# Patient Record
Sex: Female | Born: 1980 | Race: White | Hispanic: No | Marital: Married | Smoking: Current every day smoker
Health system: Southern US, Community
[De-identification: ages and names within clinical notes are randomized; demographics above are authoritative.]

## PROBLEM LIST (undated history)

## (undated) VITALS — BP 111/78 | HR 120 | Temp 98.8°F | Resp 16

## (undated) DIAGNOSIS — J189 Pneumonia, unspecified organism: Secondary | ICD-10-CM

## (undated) DIAGNOSIS — K829 Disease of gallbladder, unspecified: Secondary | ICD-10-CM

## (undated) DIAGNOSIS — F32A Depression, unspecified: Secondary | ICD-10-CM

## (undated) DIAGNOSIS — G894 Chronic pain syndrome: Secondary | ICD-10-CM

## (undated) DIAGNOSIS — J45909 Unspecified asthma, uncomplicated: Secondary | ICD-10-CM

## (undated) DIAGNOSIS — E119 Type 2 diabetes mellitus without complications: Secondary | ICD-10-CM

## (undated) DIAGNOSIS — M549 Dorsalgia, unspecified: Secondary | ICD-10-CM

## (undated) DIAGNOSIS — F429 Obsessive-compulsive disorder, unspecified: Secondary | ICD-10-CM

## (undated) DIAGNOSIS — F329 Major depressive disorder, single episode, unspecified: Secondary | ICD-10-CM

## (undated) DIAGNOSIS — F419 Anxiety disorder, unspecified: Secondary | ICD-10-CM

## (undated) HISTORY — PX: OTHER SURGICAL HISTORY: SHX169

---

## 2001-09-01 ENCOUNTER — Emergency Department (HOSPITAL_COMMUNITY): Admission: EM | Admit: 2001-09-01 | Discharge: 2001-09-01 | Payer: Self-pay | Admitting: Emergency Medicine

## 2003-08-06 ENCOUNTER — Emergency Department (HOSPITAL_COMMUNITY): Admission: EM | Admit: 2003-08-06 | Discharge: 2003-08-06 | Payer: Self-pay | Admitting: Emergency Medicine

## 2003-08-07 ENCOUNTER — Emergency Department (HOSPITAL_COMMUNITY): Admission: EM | Admit: 2003-08-07 | Discharge: 2003-08-07 | Payer: Self-pay | Admitting: Emergency Medicine

## 2005-10-15 ENCOUNTER — Emergency Department (HOSPITAL_COMMUNITY): Admission: EM | Admit: 2005-10-15 | Discharge: 2005-10-15 | Payer: Self-pay | Admitting: *Deleted

## 2005-10-20 ENCOUNTER — Emergency Department (HOSPITAL_COMMUNITY): Admission: EM | Admit: 2005-10-20 | Discharge: 2005-10-20 | Payer: Self-pay | Admitting: Emergency Medicine

## 2005-10-25 ENCOUNTER — Emergency Department (HOSPITAL_COMMUNITY): Admission: EM | Admit: 2005-10-25 | Discharge: 2005-10-25 | Payer: Self-pay | Admitting: Emergency Medicine

## 2006-11-30 ENCOUNTER — Inpatient Hospital Stay (HOSPITAL_COMMUNITY): Admission: AD | Admit: 2006-11-30 | Discharge: 2006-11-30 | Payer: Self-pay | Admitting: Gynecology

## 2006-12-06 ENCOUNTER — Inpatient Hospital Stay (HOSPITAL_COMMUNITY): Admission: AD | Admit: 2006-12-06 | Discharge: 2006-12-06 | Payer: Self-pay | Admitting: Obstetrics and Gynecology

## 2006-12-09 ENCOUNTER — Inpatient Hospital Stay (HOSPITAL_COMMUNITY): Admission: AD | Admit: 2006-12-09 | Discharge: 2006-12-10 | Payer: Self-pay | Admitting: Obstetrics and Gynecology

## 2007-02-15 ENCOUNTER — Emergency Department (HOSPITAL_COMMUNITY): Admission: EM | Admit: 2007-02-15 | Discharge: 2007-02-15 | Payer: Self-pay | Admitting: Emergency Medicine

## 2007-02-22 ENCOUNTER — Inpatient Hospital Stay (HOSPITAL_COMMUNITY): Admission: AD | Admit: 2007-02-22 | Discharge: 2007-02-23 | Payer: Self-pay | Admitting: Obstetrics & Gynecology

## 2007-03-03 ENCOUNTER — Inpatient Hospital Stay (HOSPITAL_COMMUNITY): Admission: RE | Admit: 2007-03-03 | Discharge: 2007-03-03 | Payer: Self-pay | Admitting: Obstetrics & Gynecology

## 2007-04-14 ENCOUNTER — Inpatient Hospital Stay (HOSPITAL_COMMUNITY): Admission: AD | Admit: 2007-04-14 | Discharge: 2007-04-14 | Payer: Self-pay | Admitting: Obstetrics & Gynecology

## 2007-04-14 ENCOUNTER — Ambulatory Visit: Payer: Self-pay | Admitting: Physician Assistant

## 2007-04-18 ENCOUNTER — Encounter (INDEPENDENT_AMBULATORY_CARE_PROVIDER_SITE_OTHER): Payer: Self-pay | Admitting: Nurse Practitioner

## 2007-06-06 ENCOUNTER — Inpatient Hospital Stay (HOSPITAL_COMMUNITY): Admission: AD | Admit: 2007-06-06 | Discharge: 2007-06-07 | Payer: Self-pay | Admitting: Obstetrics and Gynecology

## 2007-07-12 DIAGNOSIS — F329 Major depressive disorder, single episode, unspecified: Secondary | ICD-10-CM

## 2007-07-15 ENCOUNTER — Inpatient Hospital Stay (HOSPITAL_COMMUNITY): Admission: AD | Admit: 2007-07-15 | Discharge: 2007-07-15 | Payer: Self-pay | Admitting: Obstetrics and Gynecology

## 2007-07-16 ENCOUNTER — Inpatient Hospital Stay (HOSPITAL_COMMUNITY): Admission: AD | Admit: 2007-07-16 | Discharge: 2007-07-19 | Payer: Self-pay | Admitting: Obstetrics and Gynecology

## 2007-10-12 HISTORY — PX: CHOLECYSTECTOMY: SHX55

## 2007-12-01 ENCOUNTER — Emergency Department (HOSPITAL_COMMUNITY): Admission: EM | Admit: 2007-12-01 | Discharge: 2007-12-01 | Payer: Self-pay | Admitting: Emergency Medicine

## 2008-02-26 ENCOUNTER — Emergency Department (HOSPITAL_COMMUNITY): Admission: EM | Admit: 2008-02-26 | Discharge: 2008-02-26 | Payer: Self-pay | Admitting: Emergency Medicine

## 2008-03-02 ENCOUNTER — Emergency Department (HOSPITAL_COMMUNITY): Admission: EM | Admit: 2008-03-02 | Discharge: 2008-03-02 | Payer: Self-pay | Admitting: Emergency Medicine

## 2008-03-23 ENCOUNTER — Emergency Department (HOSPITAL_COMMUNITY): Admission: EM | Admit: 2008-03-23 | Discharge: 2008-03-24 | Payer: Self-pay | Admitting: Emergency Medicine

## 2008-08-13 ENCOUNTER — Ambulatory Visit: Payer: Self-pay | Admitting: Nurse Practitioner

## 2008-08-13 DIAGNOSIS — Z720 Tobacco use: Secondary | ICD-10-CM | POA: Insufficient documentation

## 2008-08-13 DIAGNOSIS — F17200 Nicotine dependence, unspecified, uncomplicated: Secondary | ICD-10-CM | POA: Insufficient documentation

## 2008-08-13 DIAGNOSIS — G4700 Insomnia, unspecified: Secondary | ICD-10-CM | POA: Insufficient documentation

## 2008-08-19 ENCOUNTER — Encounter (INDEPENDENT_AMBULATORY_CARE_PROVIDER_SITE_OTHER): Payer: Self-pay | Admitting: Nurse Practitioner

## 2008-08-22 ENCOUNTER — Telehealth (INDEPENDENT_AMBULATORY_CARE_PROVIDER_SITE_OTHER): Payer: Self-pay | Admitting: Nurse Practitioner

## 2008-09-10 ENCOUNTER — Encounter (INDEPENDENT_AMBULATORY_CARE_PROVIDER_SITE_OTHER): Payer: Self-pay | Admitting: Nurse Practitioner

## 2008-09-12 ENCOUNTER — Telehealth (INDEPENDENT_AMBULATORY_CARE_PROVIDER_SITE_OTHER): Payer: Self-pay | Admitting: Nurse Practitioner

## 2008-09-18 ENCOUNTER — Ambulatory Visit: Payer: Self-pay | Admitting: Nurse Practitioner

## 2008-09-18 DIAGNOSIS — M549 Dorsalgia, unspecified: Secondary | ICD-10-CM | POA: Insufficient documentation

## 2008-09-18 LAB — CONVERTED CEMR LAB: Urobilinogen, UA: 1

## 2008-09-19 ENCOUNTER — Encounter (INDEPENDENT_AMBULATORY_CARE_PROVIDER_SITE_OTHER): Payer: Self-pay | Admitting: Nurse Practitioner

## 2008-09-19 ENCOUNTER — Telehealth (INDEPENDENT_AMBULATORY_CARE_PROVIDER_SITE_OTHER): Payer: Self-pay | Admitting: Nurse Practitioner

## 2008-09-19 DIAGNOSIS — E7800 Pure hypercholesterolemia, unspecified: Secondary | ICD-10-CM | POA: Insufficient documentation

## 2008-09-19 LAB — CONVERTED CEMR LAB
CO2: 23 meq/L (ref 19–32)
Calcium: 8.9 mg/dL (ref 8.4–10.5)
Chloride: 102 meq/L (ref 96–112)
Creatinine, Ser: 0.79 mg/dL (ref 0.40–1.20)
Eosinophils Absolute: 0.3 10*3/uL (ref 0.0–0.7)
Lymphocytes Relative: 35 % (ref 12–46)
MCHC: 32.9 g/dL (ref 30.0–36.0)
MCV: 93.9 fL (ref 78.0–100.0)
Monocytes Relative: 7 % (ref 3–12)
Neutro Abs: 4.9 10*3/uL (ref 1.7–7.7)
Potassium: 4 meq/L (ref 3.5–5.3)
RDW: 12.6 % (ref 11.5–15.5)
TSH: 1.262 microintl units/mL (ref 0.350–4.50)
Total Bilirubin: 0.3 mg/dL (ref 0.3–1.2)
VLDL: 64 mg/dL — ABNORMAL HIGH (ref 0–40)

## 2008-09-22 ENCOUNTER — Emergency Department (HOSPITAL_COMMUNITY): Admission: EM | Admit: 2008-09-22 | Discharge: 2008-09-22 | Payer: Self-pay | Admitting: Emergency Medicine

## 2008-10-13 ENCOUNTER — Inpatient Hospital Stay (HOSPITAL_COMMUNITY): Admission: AD | Admit: 2008-10-13 | Discharge: 2008-10-13 | Payer: Self-pay | Admitting: Family Medicine

## 2008-11-01 ENCOUNTER — Telehealth (INDEPENDENT_AMBULATORY_CARE_PROVIDER_SITE_OTHER): Payer: Self-pay | Admitting: Nurse Practitioner

## 2008-11-19 ENCOUNTER — Telehealth (INDEPENDENT_AMBULATORY_CARE_PROVIDER_SITE_OTHER): Payer: Self-pay | Admitting: Nurse Practitioner

## 2008-12-26 ENCOUNTER — Ambulatory Visit: Payer: Self-pay | Admitting: Nurse Practitioner

## 2008-12-26 ENCOUNTER — Other Ambulatory Visit: Admission: RE | Admit: 2008-12-26 | Discharge: 2008-12-26 | Payer: Self-pay | Admitting: Internal Medicine

## 2008-12-26 ENCOUNTER — Encounter (INDEPENDENT_AMBULATORY_CARE_PROVIDER_SITE_OTHER): Payer: Self-pay | Admitting: Nurse Practitioner

## 2008-12-26 LAB — CONVERTED CEMR LAB
Beta hcg, urine, semiquantitative: NEGATIVE
Bilirubin Urine: NEGATIVE
Chlamydia, DNA Probe: NEGATIVE
GC Probe Amp, Genital: NEGATIVE
Glucose, Urine, Semiquant: NEGATIVE
pH: 7.5

## 2008-12-31 ENCOUNTER — Encounter (INDEPENDENT_AMBULATORY_CARE_PROVIDER_SITE_OTHER): Payer: Self-pay | Admitting: Nurse Practitioner

## 2009-01-02 ENCOUNTER — Telehealth (INDEPENDENT_AMBULATORY_CARE_PROVIDER_SITE_OTHER): Payer: Self-pay | Admitting: Nurse Practitioner

## 2009-01-07 ENCOUNTER — Telehealth (INDEPENDENT_AMBULATORY_CARE_PROVIDER_SITE_OTHER): Payer: Self-pay | Admitting: Nurse Practitioner

## 2009-01-08 ENCOUNTER — Encounter (INDEPENDENT_AMBULATORY_CARE_PROVIDER_SITE_OTHER): Payer: Self-pay | Admitting: Nurse Practitioner

## 2009-01-17 ENCOUNTER — Encounter (INDEPENDENT_AMBULATORY_CARE_PROVIDER_SITE_OTHER): Payer: Self-pay | Admitting: Nurse Practitioner

## 2009-01-17 ENCOUNTER — Encounter: Admission: RE | Admit: 2009-01-17 | Discharge: 2009-03-03 | Payer: Self-pay | Admitting: Nurse Practitioner

## 2009-01-31 ENCOUNTER — Telehealth (INDEPENDENT_AMBULATORY_CARE_PROVIDER_SITE_OTHER): Payer: Self-pay | Admitting: Nurse Practitioner

## 2009-01-31 ENCOUNTER — Ambulatory Visit (HOSPITAL_COMMUNITY): Admission: RE | Admit: 2009-01-31 | Discharge: 2009-01-31 | Payer: Self-pay | Admitting: Nurse Practitioner

## 2009-03-03 ENCOUNTER — Encounter (INDEPENDENT_AMBULATORY_CARE_PROVIDER_SITE_OTHER): Payer: Self-pay | Admitting: Nurse Practitioner

## 2009-03-07 ENCOUNTER — Inpatient Hospital Stay (HOSPITAL_COMMUNITY): Admission: RE | Admit: 2009-03-07 | Discharge: 2009-03-09 | Payer: Self-pay | Admitting: *Deleted

## 2009-03-07 ENCOUNTER — Ambulatory Visit: Payer: Self-pay | Admitting: Psychiatry

## 2009-03-07 ENCOUNTER — Encounter: Payer: Self-pay | Admitting: Emergency Medicine

## 2009-03-09 ENCOUNTER — Encounter (INDEPENDENT_AMBULATORY_CARE_PROVIDER_SITE_OTHER): Payer: Self-pay | Admitting: Nurse Practitioner

## 2009-03-11 ENCOUNTER — Telehealth (INDEPENDENT_AMBULATORY_CARE_PROVIDER_SITE_OTHER): Payer: Self-pay | Admitting: Nurse Practitioner

## 2009-03-18 ENCOUNTER — Encounter (INDEPENDENT_AMBULATORY_CARE_PROVIDER_SITE_OTHER): Payer: Self-pay | Admitting: Nurse Practitioner

## 2009-03-20 ENCOUNTER — Encounter (INDEPENDENT_AMBULATORY_CARE_PROVIDER_SITE_OTHER): Payer: Self-pay | Admitting: Nurse Practitioner

## 2009-03-26 ENCOUNTER — Ambulatory Visit: Payer: Self-pay | Admitting: Nurse Practitioner

## 2009-03-27 ENCOUNTER — Encounter (INDEPENDENT_AMBULATORY_CARE_PROVIDER_SITE_OTHER): Payer: Self-pay | Admitting: Nurse Practitioner

## 2009-03-27 LAB — CONVERTED CEMR LAB: Cholesterol: 251 mg/dL — ABNORMAL HIGH (ref 0–200)

## 2009-04-15 ENCOUNTER — Telehealth (INDEPENDENT_AMBULATORY_CARE_PROVIDER_SITE_OTHER): Payer: Self-pay | Admitting: Nurse Practitioner

## 2009-04-15 ENCOUNTER — Encounter (INDEPENDENT_AMBULATORY_CARE_PROVIDER_SITE_OTHER): Payer: Self-pay | Admitting: Nurse Practitioner

## 2009-04-15 ENCOUNTER — Emergency Department (HOSPITAL_COMMUNITY): Admission: EM | Admit: 2009-04-15 | Discharge: 2009-04-15 | Payer: Self-pay | Admitting: Emergency Medicine

## 2009-05-26 ENCOUNTER — Inpatient Hospital Stay (HOSPITAL_COMMUNITY): Admission: AD | Admit: 2009-05-26 | Discharge: 2009-05-27 | Payer: Self-pay | Admitting: Obstetrics & Gynecology

## 2009-05-27 ENCOUNTER — Encounter (INDEPENDENT_AMBULATORY_CARE_PROVIDER_SITE_OTHER): Payer: Self-pay | Admitting: Nurse Practitioner

## 2009-06-03 ENCOUNTER — Telehealth (INDEPENDENT_AMBULATORY_CARE_PROVIDER_SITE_OTHER): Payer: Self-pay | Admitting: Nurse Practitioner

## 2009-06-13 DIAGNOSIS — N83209 Unspecified ovarian cyst, unspecified side: Secondary | ICD-10-CM | POA: Insufficient documentation

## 2009-06-18 ENCOUNTER — Telehealth (INDEPENDENT_AMBULATORY_CARE_PROVIDER_SITE_OTHER): Payer: Self-pay | Admitting: Nurse Practitioner

## 2009-07-17 ENCOUNTER — Encounter (INDEPENDENT_AMBULATORY_CARE_PROVIDER_SITE_OTHER): Payer: Self-pay | Admitting: Nurse Practitioner

## 2009-07-18 ENCOUNTER — Ambulatory Visit: Payer: Self-pay | Admitting: Nurse Practitioner

## 2009-07-18 DIAGNOSIS — R0609 Other forms of dyspnea: Secondary | ICD-10-CM

## 2009-07-18 DIAGNOSIS — R0989 Other specified symptoms and signs involving the circulatory and respiratory systems: Secondary | ICD-10-CM

## 2009-07-31 ENCOUNTER — Ambulatory Visit (HOSPITAL_BASED_OUTPATIENT_CLINIC_OR_DEPARTMENT_OTHER): Admission: RE | Admit: 2009-07-31 | Discharge: 2009-07-31 | Payer: Self-pay | Admitting: Surgery

## 2009-08-02 ENCOUNTER — Ambulatory Visit: Payer: Self-pay | Admitting: Internal Medicine

## 2009-08-18 ENCOUNTER — Encounter (INDEPENDENT_AMBULATORY_CARE_PROVIDER_SITE_OTHER): Payer: Self-pay | Admitting: Nurse Practitioner

## 2009-08-29 ENCOUNTER — Telehealth (INDEPENDENT_AMBULATORY_CARE_PROVIDER_SITE_OTHER): Payer: Self-pay | Admitting: Nurse Practitioner

## 2010-01-12 ENCOUNTER — Telehealth (INDEPENDENT_AMBULATORY_CARE_PROVIDER_SITE_OTHER): Payer: Self-pay | Admitting: Nurse Practitioner

## 2010-02-13 ENCOUNTER — Telehealth (INDEPENDENT_AMBULATORY_CARE_PROVIDER_SITE_OTHER): Payer: Self-pay | Admitting: Nurse Practitioner

## 2010-03-20 ENCOUNTER — Telehealth (INDEPENDENT_AMBULATORY_CARE_PROVIDER_SITE_OTHER): Payer: Self-pay | Admitting: Nurse Practitioner

## 2010-05-08 ENCOUNTER — Telehealth (INDEPENDENT_AMBULATORY_CARE_PROVIDER_SITE_OTHER): Payer: Self-pay | Admitting: Nurse Practitioner

## 2010-10-16 ENCOUNTER — Telehealth (INDEPENDENT_AMBULATORY_CARE_PROVIDER_SITE_OTHER): Payer: Self-pay | Admitting: Nurse Practitioner

## 2010-11-10 NOTE — Progress Notes (Signed)
Summary: refills  Phone Note Call from Patient Call back at [facility] Phone [phone]   Summary of Call: In the past pt was taking darvocet but now is not in the market and pt is wondering what will be the new medication she will start taking for her back pain. Please call her back.  ([facility]) Daphine Deutscher FNP Initial call taken by: Manon Hilding,  January 12, 2010 12:08 PM  Follow-up for Phone Call        forward to N. Daphine Deutscher, FNP Follow-up by: Levon Hedger,  January 12, 2010 12:40 PM  Additional Follow-up for Phone Call Additional follow up Details #1::        Will start ultracet - 1 tablet by mouth two times a day as needed for pain (Rx in basket) she would also need to alternate with ibuprofen 800mg  by mouth daily Additional Follow-up by: Lehman Prom FNP,  January 12, 2010 12:45 PM    Additional Follow-up for Phone Call Additional follow up Details #2::    PATIENT WAS TOLD THE INFORMATION AND THE RX WAS FAXED. Follow-up by: Leodis Rains,  January 12, 2010 4:35 PM  New/Updated Medications: ULTRACET 37.5-325 MG TABS (TRAMADOL-ACETAMINOPHEN) One tablet by mouth two times a day as needed for pain Prescriptions: ULTRACET 37.5-325 MG TABS (TRAMADOL-ACETAMINOPHEN) One tablet by mouth two times a day as needed for pain  #50 x 0   Entered and Authorized by:   Lehman Prom FNP   Signed by:   Lehman Prom FNP on 01/12/2010   Method used:   Printed then faxed to ...       [facility]  [place] [address] (retail)       [address]       [place], [place]  [address]       Ph: [phone] or [phone]       Fax: [phone]   RxID:   [id number]

## 2010-11-10 NOTE — Progress Notes (Signed)
Summary: REFILL ON TRAMADOL  Phone Note Call from Patient Call back at Home Phone [phone]   Reason for Call: Refill Medication Summary of Call: [name] CALLED IN FOR A REFILL ON HER TRAMADOL. [facility] [place] Initial call taken by: Leodis Rains,  March 20, 2010 2:48 PM  Follow-up for Phone Call        forward to N. Daphine Deutscher, FNP last filled 02/10/10 Follow-up by: Levon Hedger,  March 20, 2010 4:33 PM  Additional Follow-up for Phone Call Additional follow up Details #1::        Rx printed to fax to pharmacy instruct pt to check there Additional Follow-up by: Lehman Prom FNP,  March 20, 2010 4:45 PM    Additional Follow-up for Phone Call Additional follow up Details #2::    Left message on voicemail to return call.  Dutch Quint RN  March 23, 2010 10:17 AM  spoke with pt she is informed.  Follow-up by: Levon Hedger,  March 24, 2010 9:49 AM  Prescriptions: ULTRACET 37.5-325 MG TABS (TRAMADOL-ACETAMINOPHEN) One tablet by mouth two times a day as needed for pain  #50 x 0   Entered and Authorized by:   Lehman Prom FNP   Signed by:   Lehman Prom FNP on 03/20/2010   Method used:   Printed then faxed to ...       [facility]  [place] [address] (retail)       [address]       [place], [place]  [address]       Ph: [phone] or [phone]       Fax: [phone]   RxID:   [id number]

## 2010-11-10 NOTE — Progress Notes (Signed)
Summary: REFILL ON TRAMADOL  Phone Note Call from Patient Call back at [facility] Phone [phone]   Reason for Call: Refill Medication Summary of Call: MARTIN PT. MS HASWELL NEEDS YOU TO CALL IN HER TRAMADOL TO [facility] [place]. Initial call taken by: Leodis Rains,  May 08, 2010 11:12 AM  Follow-up for Phone Call        forward to N. Daphine Deutscher, fnp Follow-up by: Levon Hedger,  May 08, 2010 12:47 PM  Additional Follow-up for Phone Call Additional follow up Details #1::        Rx printed. Shiela to fax Additional Follow-up by: Lehman Prom FNP,  May 08, 2010 2:22 PM    Additional Follow-up for Phone Call Additional follow up Details #2::    pt notified. Follow-up by: Levon Hedger,  May 08, 2010 4:17 PM  Prescriptions: ULTRACET 37.5-325 MG TABS (TRAMADOL-ACETAMINOPHEN) One tablet by mouth two times a day as needed for pain  #50 x 0   Entered and Authorized by:   Lehman Prom FNP   Signed by:   Lehman Prom FNP on 05/08/2010   Method used:   Printed then faxed to ...       [facility]  [place] [address]* (retail)       [address]       [place], [place]  [address]       Ph: [phone] or [phone]       Fax: [phone]   RxID:   [id number]

## 2010-11-10 NOTE — Progress Notes (Signed)
Summary: Med refill  Phone Note Call from Patient   Summary of Call: Pt requesting refill of Tramadol.  [facility]. Initial call taken by: Mikey College CMA,  Feb 13, 2010 4:12 PM  Follow-up for Phone Call        Tell pt sorry for the confusion Rx sent electronically earlier this week but apparently pharmacy did not receive. I personally called Rx into the pharmacy today during lunchtime between 1-2 so they should have had time to process now. Follow-up by: Lehman Prom FNP,  Feb 13, 2010 4:24 PM  Additional Follow-up for Phone Call Additional follow up Details #1::        Levon Hedger  Feb 16, 2010 2:33 PM Left message on machine for pt to return call to the office.    Additional Follow-up for Phone Call Additional follow up Details #2::    Levon Hedger  Feb 20, 2010 5:10 PM pt informed of above information. Follow-up by: Levon Hedger,  Feb 20, 2010 5:10 PM

## 2010-11-10 NOTE — Letter (Signed)
Summary: NOCTURNAL POLYSIOMNOGRAM  NOCTURNAL POLYSIOMNOGRAM   Imported By: Arta Bruce 10/16/2009 14:48:44  _____________________________________________________________________  External Attachment:    Type:   Image     Comment:   External Document

## 2010-11-12 NOTE — Progress Notes (Signed)
Summary: MEDS REFILL Flexeril  Phone Note Refill Request   Refills Requested: Medication #1:  FLEXERIL 10 MG TABS 1 tablet by mouth nightly as needed for muscles [facility] [address] [place]   Initial call taken by: Domenic Polite,  October 16, 2010 3:55 PM  Follow-up for Phone Call        pt called in to check on rx for flexril for the back pain she is having Follow-up by: Armenia Shannon,  October 19, 2010 2:41 PM  Additional Follow-up for Phone Call Additional follow up Details #1::        Rx printed - sheila to fax to pharmacy notify pt to check at pharmacy Additional Follow-up by: Lehman Prom FNP,  October 19, 2010 6:43 PM    Additional Follow-up for Phone Call Additional follow up Details #2::    Left message on voicemail for pt. to return call.  Dutch Quint RN  October 20, 2010 9:18 AM  Pt. notified. Dutch Quint RN  October 20, 2010 9:30 AM   Prescriptions: FLEXERIL 10 MG TABS (CYCLOBENZAPRINE HCL) 1 tablet by mouth nightly as needed for muscles  #30 x 0   Entered and Authorized by:   Lehman Prom FNP   Signed by:   Lehman Prom FNP on 10/19/2010   Method used:   Printed then faxed to ...       [facility]  [place] [address]* (retail)       [address]       [place], [place]  [address]       Ph: [phone] or [phone]       Fax: [phone]   RxID:   [id number]

## 2010-12-01 ENCOUNTER — Emergency Department (HOSPITAL_COMMUNITY)
Admission: EM | Admit: 2010-12-01 | Discharge: 2010-12-02 | Disposition: A | Discharge status: Other Acute Inpatient Hospital | Payer: Self-pay | Attending: Emergency Medicine | Admitting: Emergency Medicine

## 2010-12-01 DIAGNOSIS — T50901A Poisoning by unspecified drugs, medicaments and biological substances, accidental (unintentional), initial encounter: Secondary | ICD-10-CM | POA: Insufficient documentation

## 2010-12-01 DIAGNOSIS — I959 Hypotension, unspecified: Secondary | ICD-10-CM | POA: Insufficient documentation

## 2010-12-01 DIAGNOSIS — R000 Tachycardia, unspecified: Secondary | ICD-10-CM | POA: Insufficient documentation

## 2010-12-01 DIAGNOSIS — T50902A Poisoning by unspecified drugs, medicaments and biological substances, intentional self-harm, initial encounter: Secondary | ICD-10-CM | POA: Insufficient documentation

## 2010-12-01 DIAGNOSIS — IMO0002 Reserved for concepts with insufficient information to code with codable children: Secondary | ICD-10-CM | POA: Insufficient documentation

## 2010-12-01 LAB — DIFFERENTIAL
Lymphocytes Relative: 36 % (ref 12–46)
Monocytes Absolute: 0.8 10*3/uL (ref 0.1–1.0)
Monocytes Relative: 6 % (ref 3–12)
Neutro Abs: 6.8 10*3/uL (ref 1.7–7.7)

## 2010-12-01 LAB — CBC
HCT: 39.6 % (ref 36.0–46.0)
MCHC: 34.1 g/dL (ref 30.0–36.0)
MCV: 91.9 fL (ref 78.0–100.0)
Platelets: 412 10*3/uL — ABNORMAL HIGH (ref 150–400)
WBC: 12.1 10*3/uL — ABNORMAL HIGH (ref 4.0–10.5)

## 2010-12-02 ENCOUNTER — Inpatient Hospital Stay (HOSPITAL_COMMUNITY)
Admission: EM | Admit: 2010-12-02 | Discharge: 2010-12-03 | DRG: 918 | Disposition: A | Discharge status: Other Acute Inpatient Hospital | Payer: Self-pay | Source: Other Acute Inpatient Hospital | Attending: Internal Medicine | Admitting: Internal Medicine

## 2010-12-02 DIAGNOSIS — F329 Major depressive disorder, single episode, unspecified: Secondary | ICD-10-CM | POA: Diagnosis present

## 2010-12-02 DIAGNOSIS — E669 Obesity, unspecified: Secondary | ICD-10-CM | POA: Diagnosis present

## 2010-12-02 DIAGNOSIS — R45851 Suicidal ideations: Secondary | ICD-10-CM

## 2010-12-02 DIAGNOSIS — T46904A Poisoning by unspecified agents primarily affecting the cardiovascular system, undetermined, initial encounter: Principal | ICD-10-CM | POA: Diagnosis present

## 2010-12-02 DIAGNOSIS — Z91199 Patient's noncompliance with other medical treatment and regimen due to unspecified reason: Secondary | ICD-10-CM

## 2010-12-02 DIAGNOSIS — F39 Unspecified mood [affective] disorder: Secondary | ICD-10-CM

## 2010-12-02 DIAGNOSIS — T502X1A Poisoning by carbonic-anhydrase inhibitors, benzothiadiazides and other diuretics, accidental (unintentional), initial encounter: Secondary | ICD-10-CM | POA: Diagnosis present

## 2010-12-02 DIAGNOSIS — T43502A Poisoning by unspecified antipsychotics and neuroleptics, intentional self-harm, initial encounter: Secondary | ICD-10-CM | POA: Diagnosis present

## 2010-12-02 DIAGNOSIS — Z9119 Patient's noncompliance with other medical treatment and regimen: Secondary | ICD-10-CM

## 2010-12-02 DIAGNOSIS — T50992A Poisoning by other drugs, medicaments and biological substances, intentional self-harm, initial encounter: Secondary | ICD-10-CM | POA: Diagnosis present

## 2010-12-02 DIAGNOSIS — F17200 Nicotine dependence, unspecified, uncomplicated: Secondary | ICD-10-CM | POA: Diagnosis present

## 2010-12-02 DIAGNOSIS — T43294A Poisoning by other antidepressants, undetermined, initial encounter: Secondary | ICD-10-CM | POA: Diagnosis present

## 2010-12-02 LAB — COMPREHENSIVE METABOLIC PANEL
ALT: 21 U/L (ref 0–35)
AST: 19 U/L (ref 0–37)
AST: 15 U/L (ref 0–37)
Albumin: 4 g/dL (ref 3.5–5.2)
Alkaline Phosphatase: 58 U/L (ref 39–117)
BUN: 10 mg/dL (ref 6–23)
BUN: 7 mg/dL (ref 6–23)
CO2: 23 mEq/L (ref 19–32)
Calcium: 8.4 mg/dL (ref 8.4–10.5)
Chloride: 107 mEq/L (ref 96–112)
Chloride: 110 mEq/L (ref 96–112)
Creatinine, Ser: 1.24 mg/dL — ABNORMAL HIGH (ref 0.4–1.2)
Creatinine, Ser: 0.7 mg/dL (ref 0.4–1.2)
GFR calc Af Amer: >60 mL/min (ref >60)
GFR calc Af Amer: >60 mL/min (ref >60)
GFR calc non Af Amer: >60 mL/min (ref >60)
Glucose, Bld: 94 mg/dL (ref 70–99)
Total Bilirubin: 0.4 mg/dL (ref 0.3–1.2)
Total Bilirubin: 0.4 mg/dL (ref 0.3–1.2)
Total Protein: 6.8 g/dL (ref 6.0–8.3)

## 2010-12-02 LAB — LIPID PANEL
Cholesterol: 201 mg/dL — ABNORMAL HIGH (ref 0–200)
Triglycerides: 98 mg/dL (ref <150)

## 2010-12-02 LAB — CBC
HCT: 33.6 % — ABNORMAL LOW (ref 36.0–46.0)
Hemoglobin: 11.4 g/dL — ABNORMAL LOW (ref 12.0–15.0)
MCH: 30.6 pg (ref 26.0–34.0)
MCV: 90.1 fL (ref 78.0–100.0)
RBC: 3.73 MIL/uL — ABNORMAL LOW (ref 3.87–5.11)

## 2010-12-02 LAB — URINALYSIS, ROUTINE W REFLEX MICROSCOPIC
Bilirubin Urine: NEGATIVE
Nitrite: NEGATIVE
Specific Gravity, Urine: 1.015 (ref 1.005–1.030)

## 2010-12-02 LAB — ACETAMINOPHEN LEVEL: Acetaminophen (Tylenol), Serum: <10 ug/mL — ABNORMAL LOW (ref 10–30)

## 2010-12-02 LAB — URINE MICROSCOPIC-ADD ON

## 2010-12-02 LAB — ETHANOL: Alcohol, Ethyl (B): 135 mg/dL — ABNORMAL HIGH (ref 0–10)

## 2010-12-02 LAB — TSH: TSH: 0.43 u[IU]/mL (ref 0.350–4.500)

## 2010-12-02 LAB — RAPID URINE DRUG SCREEN, HOSP PERFORMED: Amphetamines: NOT DETECTED

## 2010-12-02 LAB — POCT PREGNANCY, URINE: Preg Test, Ur: NEGATIVE

## 2010-12-02 LAB — MAGNESIUM: Magnesium: 1.7 mg/dL (ref 1.5–2.5)

## 2010-12-03 ENCOUNTER — Inpatient Hospital Stay (HOSPITAL_COMMUNITY)
Admission: RE | Admit: 2010-12-03 | Discharge: 2010-12-04 | DRG: 885 | Disposition: A | Discharge status: Home / Self Care | Payer: PRIVATE HEALTH INSURANCE | Source: Ambulatory Visit | Attending: Psychiatry | Admitting: Psychiatry

## 2010-12-03 DIAGNOSIS — F329 Major depressive disorder, single episode, unspecified: Principal | ICD-10-CM

## 2010-12-03 DIAGNOSIS — F39 Unspecified mood [affective] disorder: Secondary | ICD-10-CM

## 2010-12-03 DIAGNOSIS — T43294A Poisoning by other antidepressants, undetermined, initial encounter: Secondary | ICD-10-CM

## 2010-12-03 DIAGNOSIS — T43502A Poisoning by unspecified antipsychotics and neuroleptics, intentional self-harm, initial encounter: Secondary | ICD-10-CM

## 2010-12-03 DIAGNOSIS — T50992A Poisoning by other drugs, medicaments and biological substances, intentional self-harm, initial encounter: Secondary | ICD-10-CM

## 2010-12-03 DIAGNOSIS — Z560 Unemployment, unspecified: Secondary | ICD-10-CM

## 2010-12-03 DIAGNOSIS — Z818 Family history of other mental and behavioral disorders: Secondary | ICD-10-CM

## 2010-12-03 DIAGNOSIS — T46904A Poisoning by unspecified agents primarily affecting the cardiovascular system, undetermined, initial encounter: Secondary | ICD-10-CM

## 2010-12-03 DIAGNOSIS — T502X1A Poisoning by carbonic-anhydrase inhibitors, benzothiadiazides and other diuretics, accidental (unintentional), initial encounter: Secondary | ICD-10-CM

## 2010-12-04 DIAGNOSIS — F329 Major depressive disorder, single episode, unspecified: Secondary | ICD-10-CM

## 2010-12-05 NOTE — Consult Note (Signed)
NAME:  [name], [name]              ACCOUNT NO.:  [id number]  MEDICAL RECORD NO.:  [id number]           PATIENT TYPE:  I  LOCATION:  2115                         FACILITY:  [facility]  PHYSICIAN:  Eulogio Ditch, MD DATE OF BIRTH:  February 04, 1981  DATE OF CONSULTATION:  12/02/2010 DATE OF DISCHARGE:                                CONSULTATION   REASON FOR CONSULTATION:  Drug overdose.  HISTORY OF PRESENT ILLNESS:  I saw the patient and reviewed the medical records.  The patient has been admitted to [facility] in the past for suicide attempt.  The patient was also seen by Dr. Jeanie Sewer in the past for suicide attempt.  At that time, the patient also wrote a suicide note.  The patient was treated for mood disorder in the past. The patient has been tried on antidepressants like Effexor and Lexapro, but she told me that she did not see any improvement on these medications.  She denied any manic symptoms in the past.  The patient is not on any psych medications for almost 2 years because she ran out of insurance, so she has not seen psychiatrist or getting any kind of counseling in the outpatient setting.  The patient is married since 4 months, she was knowing her husband before marriage for two and half years, but since the marriage, she told me that her husband do not care for her, he is coming selfish so that is the main reason why she overdosed.  She did not plan for it, it was an impulsive act and as per her, it was a stupid act.  The patient currently reported depressed mood, but denies any suicidal ideations.  She denies hearing any voices. She denies any paranoid behavior, is very logical and goal directed during the interview.  The patient denies any family psych history, no history of suicide attempt in the family.  PAST MEDICAL HISTORY:  History of hypertension and hyperlipidemia.  CURRENT PSYCHIATRIC MEDICATIONS:  None.  The patient overdosed on Celexa, it was the  patient's husband medication.  ALLERGIES:  No known drug allergies.  SOCIAL HISTORY:  The patient do not work, lives with her husband, has a 49-year-old son.  SUBSTANCE ABUSE HISTORY:  The patient denies abusing drugs or alcohol. Her drug screen is negative.  Her alcohol level is 135 at the time of her admission.  MENTAL STATUS EXAMINATION:  Calm and cooperative during the interview. Fair eye contact.  Hygiene and grooming, normal.  No psychomotor agitation or retardation noted during the interview.  Speech, soft and slow.  Mood depressed.  Affect, mood congruent.  Thought process logical and goal directed.  Thought content, recent suicide attempt, but currently denies suicidal ideations, not delusional.  Thought perception, no audio or visual hallucination reported, not internally preoccupied.  Cognition; alert, awake, and oriented x3.  Memory immediate, recent, and remote fair.  Attention and concentration fair. Abstraction ability, good.  Fund of knowledge fair.  Insight and judgment, poor.  DIAGNOSES:  Axis I:  Mood disorder, not otherwise specified. Axis II:  Deferred. Axis III:  See medical notes. Axis IV:  Conflict with the husband,  noncompliant with her medications for mental issues. Axis: V:  30.  RECOMMENDATIONS: 1. The patient will be admitted to [facility] for further     stabilization.  I told the patient because of her previous suicide     attempts and the seriousness of what suicide attempt, the patient     need admission in the Psych Unit.  The patient agrees with that.     If the patient refuses, the patient needs to be sent to [facility] on IVC. 2. I will not start any medications at this time because of her     overdose on Celexa and other medical meds, lisinopril and     hydrochlorothiazide. 3. Supportive psychotherapy given to the patient.     Eulogio Ditch, MD     SA/MEDQ  D:  12/02/2010  T:  12/02/2010  Job:   [id number]  Electronically Signed by Eulogio Ditch  on 12/05/2010 07:55:59 AM

## 2010-12-06 ENCOUNTER — Emergency Department (HOSPITAL_COMMUNITY)
Admission: EM | Admit: 2010-12-06 | Discharge: 2010-12-06 | Disposition: A | Discharge status: Home / Self Care | Payer: Self-pay | Attending: Emergency Medicine | Admitting: Emergency Medicine

## 2010-12-06 DIAGNOSIS — N390 Urinary tract infection, site not specified: Secondary | ICD-10-CM | POA: Insufficient documentation

## 2010-12-06 DIAGNOSIS — R6883 Chills (without fever): Secondary | ICD-10-CM | POA: Insufficient documentation

## 2010-12-06 DIAGNOSIS — R300 Dysuria: Secondary | ICD-10-CM | POA: Insufficient documentation

## 2010-12-06 DIAGNOSIS — R000 Tachycardia, unspecified: Secondary | ICD-10-CM | POA: Insufficient documentation

## 2010-12-06 LAB — URINALYSIS, ROUTINE W REFLEX MICROSCOPIC
Nitrite: POSITIVE — AB
Specific Gravity, Urine: 1.017 (ref 1.005–1.030)
Urobilinogen, UA: 2 mg/dL — ABNORMAL HIGH (ref 0.0–1.0)

## 2010-12-06 LAB — URINE MICROSCOPIC-ADD ON

## 2010-12-07 NOTE — H&P (Signed)
NAME:  [name], [name]              ACCOUNT NO.:  [id number]  MEDICAL RECORD NO.:  [id number]           PATIENT TYPE:  E  LOCATION:  WLED                         FAC[facility]  [facility]  PHYSICIAN:  Eduard Clos, MDDATE OF BIRTH:  06/25/1981  DATE OF ADMISSION:  12/01/2010 DATE OF DISCHARGE:                             HISTORY & PHYSICAL   PRIMARY CARE PHYSICIAN:  Unassigned.  CHIEF COMPLAINT:  Drug overdose.  HISTORY OF PRESENT ILLNESS:  This is a 30 year old female with known history of hypertension, depression, had taken overdose of lisinopril and HCTZ and Celexa after which the EMS was called.  The patient states that she lives with her new husband and she was depressed.  She was not in the exact reason, so she overdosed herself with lisinopril 10-mg tablets probably 20 of them, not sure this time and HCTZ 25 mg and Celexa 20 mg and the number of tablets is not exactly known.  The patient at the ER was found to be hypotensive, given fluids, which she is responding to.  She is on 4th liter of bolus.  The patient has been admitted to ICU for further management.  At this time, the patient will be transferred to [facility] as there is no room in [place].  They already discussed with Dr. Della Goo who is on-call there for [facility] hospitalist about the transfer.  The patient is agreeable to transfer.  The patient denies any chest pain.  Denies any shortness of breath, nausea, vomiting, abdominal, any dysuria, discharge, diarrhea, any focal deficits, any loss of consciousness or headache, or visual symptoms.  PAST MEDICAL HISTORY: 1. Hypertension. 2. Hyperlipidemia. 3. Depression.  PAST SURGICAL HISTORY:  C-section.  MEDICATIONS PRIOR TO ADMISSION: 1. Lisinopril 10 mg tablet p.o. daily. 2. HCTZ 25 mg daily. 3. Celexa 20 mg daily.  ALLERGIES:  No known drug allergies.  FAMILY HISTORY:  Positive for bipolar disorder.  SOCIAL HISTORY:  The patient  lives with her husband.  She stated that she has got newly married.  Smokes cigarettes.  Denies any alcohol or drug abuse.  CODE STATUS:  Full code.  ALLERGIES:  No known drug allergies.  REVIEW OF SYSTEMS:  As per the history of present illness, nothing else significant.  PHYSICAL EXAMINATION:  GENERAL:  The patient is examined at bedside, not in acute distress. VITAL SIGNS:  Blood pressure is 110/60, pulse 90 per minute, temperature 98.1, respirations 18, O2 saturation is 100%. HEENT: Anicteric.  No pallor.  No discharge from ears, eyes, nose or mouth. CHEST:  She has bilateral air entry present.  No rhonchi or crepitation. HEART:  S1 and S2 heard. ABDOMEN:  Soft, nontender.  Bowel sounds heard. CNS:  Alert, awake and oriented to time, place and person.  Moves upper and lower extremities, 5/5. EXTREMITIES:  Peripheral pulses felt.  No edema.  LABORATORY DATA:  EKG shows sinus tachycardia that beats around 116 beats per minute.  CBC:  WBC 4.1, hemoglobin 13.5, hematocrit 39.6, platelets 412.  Complete metabolic panel:  Sodium 139, potassium 3.4, chloride 107, carbon dioxide 24, glucose 115, BUN 10, creatinine 1.2, total  bilirubin is 0.4, alkaline phosphatase is 58, AST 19, ALT 21, total protein 6.8, albumin 4, calcium 8.4, pregnancy screen is negative. UA is showing negative for nitrites and leukocytes.  Drug screen is negative.  Alcohol level is 135.  ASSESSMENT: 1. Intentional drug overdose with lisinopril, Celexa and HCTZ. 2. Suicidal. 3. Major depression. 4. History of hypertension. 5. Obesity.  PLAN: 1. At this time, we will admit the patient to intensive care unit. 2. At this time, Dr. Marisa Severin, ER physician has already discussed     with Poison Control who had advised to be cautious about seizures     with regard to Celexa and IV hydration with regard to overdose with     lisinopril and HCTZ.  At this time, the patient is already     receiving the 4th bolus of  normal saline.  Blood pressure is around     110.  As the patient gets hypotensive and the fluid is stopped, we     are going to monitor in the ICU.  At this time, the patient will be     transferred to [facility] as there is no bed in [place].  I     have discussed with Dr. Della Goo about the transfer. 3. The patient has depression.  Will need Psychiatry consult her once     stabilized.  The patient is suicidal.  She states she took the     medicines to kill herself.  At this time, we will keep the patient     on 1:1 sitter. 4. Once the patient is stabilized, we will follow psychiatric     recommendations.  Further recommendations as condition evolves.     Eduard Clos, MD     ANK/MEDQ  D:  12/02/2010  T:  12/02/2010  Job:  [id number]  Electronically Signed by Midge Minium MD on 12/07/2010 05:00:32 PM

## 2010-12-08 LAB — URINE CULTURE: Colony Count: >=100000

## 2010-12-30 NOTE — Discharge Summary (Signed)
  NAME:  [name], [name]              ACCOUNT NO.:  [id number]  MEDICAL RECORD NO.:  [id number]           PATIENT TYPE:  I  LOCATION:  2115                         FACILITY:  [facility]  PHYSICIAN:  Clydia Llano, MD       DATE OF BIRTH:  [birth year]  DATE OF ADMISSION:  12/02/2010 DATE OF DISCHARGE:                        DISCHARGE SUMMARY - REFERRING   PRIMARY CARE PHYSICIAN:  Lehman Prom, FNP NP.  REASON FOR ADMISSION:  Drug overdose.  DISCHARGE DIAGNOSES: 1. Suicidal attempt. 2. Drug overdose. 3. Depression. 4. Anxiety. 5. Overweight.  DISCHARGE MEDICATIONS:  None.  CONSULTS:  Dr. Eulogio Ditch from Psychiatry Service.  BRIEF HISTORY EXAMINATION:  [name] is a 30 year old Caucasian female with no significant past medical history apart from overweight. The patient presented to the hospital via EMS after she overdosed in her husband medications.  The patient took lisinopril, hydrochlorothiazide, and Celexa.  Did not specify the specific numbers and probably a little bit over 20 of each.  The patient upon initial evaluation in the ED was found to be hypotensive given fluids, which is about 4 L and admitted to the ICU for further management.  BRIEF HOSPITAL COURSE:  The patient was presented to [facility] but because she was about to send to the ICU, there was no bed in [facility].  The patient is now transferred to [facility] to 2100 units.  She stayed for 2 days in the hospital in the ICU.  Her blood pressure was holding after about 5 L of IV fluids without any problems.  On the day of discharge, her systolic blood pressure runs about 106 to 113 and the patient is asymptomatic.  Dr. Rogers Blocker from psychiatry service has evaluated the patient yesterday and recommended to admitted to [facility] for further evaluation of her suicidal attempt and mood disorder.  The patient has previous admission in 2010 with previous  suicidal attempt as well.  DISCHARGE INSTRUCTIONS: 1. Diet regular. 2. Activity as tolerated.  DISPOSITION:  [name].     Clydia Llano, MD     ME/MEDQ  D:  12/03/2010  T:  12/03/2010  Job:  [id number]  cc:   Chipper Oman, FNP NP  Electronically Signed by Clydia Llano  on 12/30/2010 07:48:15 PM

## 2011-01-06 ENCOUNTER — Ambulatory Visit (HOSPITAL_COMMUNITY): Payer: PRIVATE HEALTH INSURANCE | Admitting: Physician Assistant

## 2011-01-12 ENCOUNTER — Ambulatory Visit (HOSPITAL_COMMUNITY): Payer: PRIVATE HEALTH INSURANCE | Admitting: Physician Assistant

## 2011-01-16 LAB — URINALYSIS, ROUTINE W REFLEX MICROSCOPIC
Bilirubin Urine: NEGATIVE
Ketones, ur: NEGATIVE mg/dL
Nitrite: NEGATIVE
Urobilinogen, UA: 0.2 mg/dL (ref 0.0–1.0)

## 2011-01-16 LAB — POCT PREGNANCY, URINE
Preg Test, Ur: NEGATIVE
Preg Test, Ur: NEGATIVE

## 2011-01-16 LAB — WET PREP, GENITAL

## 2011-01-17 LAB — DIFFERENTIAL
Basophils Relative: 0 % (ref 0–1)
Lymphs Abs: 4 10*3/uL (ref 0.7–4.0)
Monocytes Absolute: 0.4 10*3/uL (ref 0.1–1.0)
Monocytes Relative: 3 % (ref 3–12)
Neutro Abs: 6.9 10*3/uL (ref 1.7–7.7)

## 2011-01-17 LAB — CBC
Hemoglobin: 13.6 g/dL (ref 12.0–15.0)
MCHC: 35.8 g/dL (ref 30.0–36.0)
RBC: 4.03 MIL/uL (ref 3.87–5.11)
WBC: 11.6 10*3/uL — ABNORMAL HIGH (ref 4.0–10.5)

## 2011-01-17 LAB — TRICYCLICS SCREEN, URINE: TCA Scrn: POSITIVE — AB

## 2011-01-17 LAB — RAPID URINE DRUG SCREEN, HOSP PERFORMED
Cocaine: NOT DETECTED
Opiates: NOT DETECTED

## 2011-01-17 LAB — BASIC METABOLIC PANEL
CO2: 31 mEq/L (ref 19–32)
Calcium: 9.4 mg/dL (ref 8.4–10.5)
Chloride: 107 mEq/L (ref 96–112)
GFR calc Af Amer: >60 mL/min (ref >60)
Sodium: 142 mEq/L (ref 135–145)

## 2011-01-19 LAB — RAPID URINE DRUG SCREEN, HOSP PERFORMED
Amphetamines: NOT DETECTED
Benzodiazepines: NOT DETECTED
Cocaine: NOT DETECTED
Tetrahydrocannabinol: NOT DETECTED

## 2011-01-19 LAB — HEPATIC FUNCTION PANEL
ALT: 12 U/L (ref 0–35)
ALT: 15 U/L (ref 0–35)
AST: 19 U/L (ref 0–37)
Albumin: 3.5 g/dL (ref 3.5–5.2)
Albumin: 4 g/dL (ref 3.5–5.2)
Alkaline Phosphatase: 51 U/L (ref 39–117)
Alkaline Phosphatase: 59 U/L (ref 39–117)
Total Bilirubin: 7.7 mg/dL — ABNORMAL HIGH (ref 0.3–1.2)
Total Protein: 6.8 g/dL (ref 6.0–8.3)

## 2011-01-19 LAB — POCT I-STAT, CHEM 8
BUN: 11 mg/dL (ref 6–23)
Calcium, Ion: 1.1 mmol/L — ABNORMAL LOW (ref 1.12–1.32)
Chloride: 109 mEq/L (ref 96–112)
Creatinine, Ser: 0.9 mg/dL (ref 0.4–1.2)
Glucose, Bld: 108 mg/dL — ABNORMAL HIGH (ref 70–99)

## 2011-01-19 LAB — BASIC METABOLIC PANEL
Calcium: 8.9 mg/dL (ref 8.4–10.5)
Chloride: 103 mEq/L (ref 96–112)
Creatinine, Ser: 0.83 mg/dL (ref 0.4–1.2)
GFR calc Af Amer: >60 mL/min (ref >60)
Sodium: 139 mEq/L (ref 135–145)

## 2011-01-19 LAB — DIFFERENTIAL
Lymphs Abs: 3.4 10*3/uL (ref 0.7–4.0)
Monocytes Absolute: 0.5 10*3/uL (ref 0.1–1.0)
Monocytes Relative: 5 % (ref 3–12)
Neutro Abs: 5.9 10*3/uL (ref 1.7–7.7)
Neutrophils Relative %: 59 % (ref 43–77)

## 2011-01-19 LAB — CBC
Hemoglobin: 13.2 g/dL (ref 12.0–15.0)
MCV: 91.2 fL (ref 78.0–100.0)
RBC: 4.14 MIL/uL (ref 3.87–5.11)
WBC: 10.1 10*3/uL (ref 4.0–10.5)

## 2011-01-19 LAB — TRICYCLICS SCREEN, URINE: TCA Scrn: POSITIVE — AB

## 2011-01-19 NOTE — H&P (Signed)
NAME:  [name], [name]              ACCOUNT NO.:  [id number]  MEDICAL RECORD NO.:  [id number]           PATIENT TYPE:  I  LOCATION:  0307                          [facility]  [facility]  PHYSICIAN:  Verne Spurr, PA      DATE OF BIRTH:  Oct 21, 1980  DATE OF ADMISSION:  12/03/2010 DATE OF DISCHARGE:                      PSYCHIATRIC ADMISSION ASSESSMENT   The patient is a 30 year old white female who presents to [facility] after a couple of days in the ICU after an overdose on medication with lisinopril, Celexa and hydrochlorothiazide, her husband's pills. The patient had been off her antidepressants for over a year and a half and not getting regular health care.  Things have been increasing steadily for the last several weeks.  She is unemployed, her husband works Holiday representative, and finances are a problem as well.  She has a 17- year-old son.  The patient has a history of severe depression and ongoing currently, with marital relationship, financial crisis with an intentional overdose.  PAST PSYCHIATRIC HISTORY:  Significant for admission to [facility] in May 2010 and recently had a consultation while in the ICU. She was referred here for follow-up after being medically cleared in the ICU.  SOCIAL HISTORY:  The patient is married and lives with her husband and 64- year-old son.  Due to complications with Medicaid and child support, she has not been getting health care, has not seen her provider in over a year.  Currently unemployed.  As noted, she had two previous psychiatric admissions.  FAMILY HISTORY:  Noncontributory.  ALCOHOL AND DRUG HISTORY:  She denies alcohol and drug abuse.  PRIMARY CARE:  She sees Shirlean Schlein but, again, has not seen her in some time.  PAST MEDICAL HISTORY:  She states that she had been diagnosed with elevated cholesterol in the past but does not take medication for that. Unfortunately, noted on her previous admission history and physical  they have listed the medications that she overdosed on, which included lisinopril, hydrochlorothiazide and Celexa as her medications.  These are her husband's medications.  She has no known drug allergies.  FAMILY HISTORY:  Significant for bipolar disorder.  She smokes cigarettes daily but denies alcohol or drug abuse.  She is on no medications.  The patient was evaluated and treated in the ICU.  Significant laboratory results:  Alcohol level was 135 and the patient says that this was an impulse moved secondary to being depressed.  Urine drug screen was negative.  Metabolic profile:  Sodium 139, potassium 3.4, chloride 107, carbon dioxide 24, glucose 115, BUN is 10, creatinine 1.2, total bilirubin 0.4, alkaline phosphatase was 58, AST 19, ALT 21, total protein 6.4, albumin 4, calcium 8.4.  Pregnancy screen was negative.  UA was negative.  Urine drug screen was negative.  She was cleared medically and referred to [facility].  Today she is alert, oriented and cooperative.  No evidence of psychosis.Denies suicidality or homicidality.  She is casually dressed and neat and clean.  She makes good eye contact.  Speech is clear, goal-oriented, coherent.  Mood:  Affect is slightly blunted and mood is depressed. Thought process is normal  with linear thinking, organized pattern, no evidence of auditory or visual hallucinations.  No evidence of psychosis.  Cognitive:  She is of at least average intelligence.  ASSESSMENT:  Axis I:  Major depressive disorder, untreated, with intentional overdose. Axis II:  Negative. Axis III:  Hyperlipidemia. Axis IV:  Problems with new marital relationship, occupational and financial issues, access to medication and health care. Axis V:  Current GAF is 50.  PLAN:  The patient is given information regarding patient assistance program for [employer], where both Effexor and Neurontin are made.  She will be given prescriptions or at least  samples until she can follow up with her follow-up appointment, which is scheduled for February 29, to tide her over until then.  We will start those today, giving her samples upon on her discharge.  Counselor spoke with the patient's husband, who discussed his willingness to monitor the patient's medication compliance support.  He was given information regarding suicide prevention, attempt, etc., and agrees that she is safe to come home and he is willing to be supportive and monitor the patient's behavior and provide support, and there are no firearms in the home.          ______________________________ Verne Spurr, PA     NM/MEDQ  D:  12/04/2010  T:  12/04/2010  Job:  [id number]  Electronically Signed by Verne Spurr  on 01/19/2011 09:47:05 AM

## 2011-01-25 LAB — URINALYSIS, ROUTINE W REFLEX MICROSCOPIC
Bilirubin Urine: NEGATIVE
Hgb urine dipstick: NEGATIVE
Specific Gravity, Urine: 1.01 (ref 1.005–1.030)
pH: 6 (ref 5.0–8.0)

## 2011-01-25 LAB — GC/CHLAMYDIA PROBE AMP, GENITAL
Chlamydia, DNA Probe: NEGATIVE
GC Probe Amp, Genital: NEGATIVE

## 2011-02-03 ENCOUNTER — Encounter (HOSPITAL_COMMUNITY): Payer: PRIVATE HEALTH INSURANCE | Admitting: Physician Assistant

## 2011-02-23 NOTE — Op Note (Signed)
NAMEMAIRE, GOVAN              ACCOUNT NO.:  [id number]   MEDICAL RECORD NO.:  [id number]          PATIENT TYPE:  INP   LOCATION:  9165                          FACILITY:  [facility]   PHYSICIAN:  Malva Limes, M.D.    DATE OF BIRTH:  Oct 25, 1980   DATE OF PROCEDURE:  07/16/2007  DATE OF DISCHARGE:                               OPERATIVE REPORT   PREOPERATIVE DIAGNOSES:  1. Intrauterine pregnancy at term.  2. Failure to progress.   POSTOPERATIVE DIAGNOSES:  1. Intrauterine pregnancy at term.  2. Failure to progress.   PROCEDURE:  Primary low transverse cesarean section.   SURGEON:  Malva Limes, MD.   ANESTHESIA:  Epidural.   ANTIBIOTICS:  Ancef 1 gm.   DRAINS:  Foley bedside drainage.   ESTIMATED BLOOD LOSS:  900 ml.   COMPLICATIONS:  None.   SPECIMENS:  None.   FINDINGS:  The patient had peritubular adhesions; otherwise, the  fallopian tubes appeared to be normal, ovaries were bilaterally normal,  the uterus appeared to be normal.   PROCEDURE:  The patient was taken to the operating room and placed in  the dorsal-supine position with a left lateral tilt.  Once an adequate  anesthetic level was reached, the patient was prepped with Betadine and  draped in the usual fashion for this procedure.  A Pfannenstiel incision  was made 2 cm above the pubic symphysis.  This was carried down to the  fascia.  The fascia was entered in the midline and extended laterally  with the Mayo scissors.  The rectus muscles were dissected from the  fascia with the Bovie.  The muscles were divided in the midline and  taken superiorly and inferiorly.  The parietal peritoneum was opened,  and the bladder flap was taken down sharply.  A low transverse uterine  incision was made in the midline and extended laterally with blunt  dissection.  The amniotic fluid was noted to be clear.  The infant was  delivered in the vertex presentation.  On delivery of the head, the  oropharynx and nostrils were  bulb suctioned.  The remaining infant was  delivered, the cord doubly clamped and cut, and handed to the awaiting  NICU team.  The placenta was then manually removed, the uterus was  exteriorized, the uterine cavity was cleaned with a wet lap and  inspected.  The uterine incision was closed with a single layer of #0  Monocryl in a running locking fashion, the bladder flap was closed with  an #0 Monocryl suture in a running fashion.  The uterus was placed back  in the abdominal cavity.  Hemostasis appeared to be adequate.  The  parietal peritoneum and rectus muscles were reapproximated in the  midline with #2-0 Monocryl.  The fascia was closed with an #0 Monocryl  suture in a running fashion.  The subcuticular tissue was made  hemostatic with the Bovie, and stainless steel clips were used to close  the skin.  The patient tolerated the procedure well.  She was taken to  the recovery room in stable condition.  Instrument and lap count  was  correct x2.           ______________________________  Malva Limes, M.D.     MA/MEDQ  D:  07/16/2007  T:  07/16/2007  Job:  [id number]

## 2011-02-23 NOTE — Consult Note (Signed)
NAMECHANETTE, DEMO              ACCOUNT NO.:  [id number]   MEDICAL RECORD NO.:  [id number]          PATIENT TYPE:  IPS   LOCATION:  0303                          [facility]   PHYSICIAN:  Antonietta Breach, M.D.  DATE OF BIRTH:  01-Feb-1981   DATE OF CONSULTATION:  03/07/2009  DATE OF DISCHARGE:                                 CONSULTATION   REQUESTING PHYSICIAN:  [name]   REASON FOR CONSULTATION:  Suicide attempt, severe depression.   HISTORY OF PRESENT ILLNESS:  [name] is a 30 year old female  presenting to the [facility] after overdosing on 30 naproxen.   She has been experiencing over 4 weeks of progressive depressed mood,  anhedonia, difficulty concentrating and insomnia.  She is crying almost  constantly.   Her precipitating stress has involved an argument with her fiance.  She  did leave a suicide note apologizing to her 70-month-old son.   She is not having any hallucinations or delusions.   PAST PSYCHIATRIC HISTORY:  She has no history of elevated energy, racing  thoughts, decreased need for sleep.   She did develop her first episode of major depression after the birth of  her child.   She has been treated by her primary care physician, first with Lexapro.  The Lexapro was noneffective.  Then, she was switched to Effexor which  was titrated to 150 mg daily.  She has been taking this with her last  dose yesterday.  There has been no improvement correlated with her  Effexor.   She does have a history of excessive worry, feeling on edge and muscle  tension.   FAMILY PSYCHIATRIC HISTORY:  None known.   SOCIAL HISTORY:  There is no physical abuse in the relationship.  She  has been engaged for 3 years.   She is not using any illegal drugs or alcohol.  Occupation:  Unemployed.   PAST MEDICAL HISTORY:  Hypercholesterolemia and hypoglycemia.   MEDICATIONS:  The MAR is reviewed.  Please see the above.   She is only receiving  Effexor 37.5 mg per day at this time.  She has no  known drug allergies.   LABORATORY DATA:  SGOT is 19, SGPT 12, aspirin negative, lipase  negative, Tylenol negative.  Sodium 139, BUN 11, creatinine 0.9, glucose  108.  Her alcohol level was negative.  Tricyclics positive.  Urine drug  screen negative.  WBC 10.1, hemoglobin 13.2, platelet count 400.   REVIEW OF SYSTEMS:  CONSTITUTIONAL/HEAD/EYES/EARS/NOSE/THROAT/MOUTH/NEUROLOGIC/PSYCHIATRIC/C  ARDIOVASCULAR/RESPIRATORY/GASTROINTESTINAL/GENITOURINARY/SKIN/MUSCULOSKE  LETAL/HEMATOLOGIC/LYMPHATIC/ENDOCRINE METABOLIC:  All unremarkable.   PHYSICAL EXAMINATION:  VITAL SIGNS:  Temperature 96.4, pulse 91,  respiratory rate 16, blood pressure 121/73.  GENERAL APPEARANCE:  [name] is a young female appearing her  chronologic age partially reclined in a supine position in her hospital  bed with no abnormal involuntary movements.   MENTAL STATUS EXAMINATION:  [name] is alert.  Her eye contact is  intermittent.  Her affect is very constricted with almost constant  sobbing.  Her mood is very depressed.  Her attention span is moderately  decreased.  Concentration is decreased.  She  is oriented to all spheres.  Her memory is intact to immediate, recent and remote.  Fund of knowledge  and intelligence are grossly within normal limits.  Her speech involves  normal rate and prosody except that it is interrupted with sobbing.  Thought process is logical, coherent, goal directed.  No looseness of  associations.  Thought content:  She acknowledges suicide intent.  She  has thoughts of hopelessness and helplessness.  Insight is intact for  having severe depression.  Judgment is impaired.   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  Rule out major depressive disorder, recurrent, severe.  293.84  Anxiety disorder, not otherwise specified.  AXIS II:  Deferred.  AXIS III:  See past medical history.  AXIS IV:   Primary support group.  AXIS V:  20.   [name] is at risk for suicide.   RECOMMENDATION:  1. Would admit [name] to an inpatient psychiatric unit for      further evaluation and treatment.  2. The indications, alternatives and adverse effects of the following      were discussed with the patient:  Effexor for antidepression,      antianxiety and Vistaril for anti-acute insomnia and anti-acute      anxiety.  She understands and wants to proceed as below.  3. Would give her a total of 75 mg of Effexor XR daily for now.  This      will allow avoiding Effexor withdrawal and will leave the option of      finishing the taper off versus augmentation with her inpatient      unit.  4. Would utilize Vistaril 25 to 50 mg p.o. t.i.d. p.r.n. anxiety      and/or insomnia with caution about excessive sedation.  5. Ego-supportive therapy.      Antonietta Breach, M.D.  Electronically Signed     JW/MEDQ  D:  03/08/2009  T:  03/08/2009  Job:  [id number]

## 2011-02-23 NOTE — H&P (Signed)
[name], [name]              ACCOUNT NO.:  [id number]   MEDICAL RECORD NO.:  [id number]          PATIENT TYPE:  IPS   LOCATION:  0303                          [facility]   PHYSICIAN:  Jasmine Pang, M.D. DATE OF BIRTH:  29-Sep-1981   DATE OF ADMISSION:  03/07/2009  DATE OF DISCHARGE:                       PSYCHIATRIC ADMISSION ASSESSMENT   This is a voluntary admission to the services of Dr. Milford Cage.  This is a 30 year old divorced albeit now engaged white female.  She  presented to the [facility] yesterday after overdosing.  Apparently, she had been arguing with her current fiance.  She took 30  naproxen.  She immediately regretted her decision and came to the  hospital.  She stated that she has been overwhelmed by her sick baby, a  68-month-old son, her stepdaughter has been in and out of the mental  hospital and financial issues.  She has been dealing with depression  since the birth of the baby and feels that the Effexor is not working.  She denied homicidal ideation and/or psychosis.  She is currently up to  150 mg of Effexor a day, this is prescribed by Dr. Daphine Deutscher from  [facility].  She states that she is just tired of everything.  She gets  irritable and angry ever since the birth of her son and it is hard to  initiate sleep.   PAST PSYCHIATRIC HISTORY:  Other than being treated on an outpatient  basis with the Effexor she has no prior history.   SOCIAL HISTORY:  She went to the eleventh grade.  She has been married  and divorced once.  She is currently engaged.  She has a 34-month-old  son by her fiance.  She was last employed in May 2008.  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   FAMILY HISTORY:  Her mom and dad had depression, alcohol and drug  history.  She denies.  She does smoke 1/2 pack of cigarettes per day.  Her UDS was negative.   MEDICATIONS:  She is currently prescribed Effexor 150 mg p.o. daily by  Dr. Daphine Deutscher.   DRUG  ALLERGIES:  NO KNOWN DRUG ALLERGIES.   PHYSICAL EXAMINATION:  GENERAL:  She was medically cleared in the EDD at  [facility].  She had no remarkable physical findings.  VITAL SIGNS:  On admission to the unit were temperature 98.5, her pulse  ranged from 110-115.  Her respirations were 16-20.  Her blood pressure  was 135/87 to 142/96.   MENTAL STATUS EXAM:  Today, she was easily aroused and able to come to  the consultation room without any motor issues.  She was casually  groomed and dressed in hospital attire.  She appears to be adequately  nourished.  Her speech was not pressured.  Her mood was somewhat labile.  She did become quite tearful.  Her thought processes are somewhat clear,  rationale and goal oriented.  She is very angry with her fiance, does  not want to see him right now.  Concentration and memory are intact.  Intelligence is at least average.  She denied being suicidal or  homicidal.  She denied any auditory of visual hallucinations.   DIAGNOSES:  Axis I:  Depressive disorder, not otherwise specified.  Axis II:  Deferred.  Axis III:  She has been told that her cholesterol level is elevated.  Axis IV:  Increased stress with family and financial issues.  Axis V:  Global Assessment of Functioning 35.   PLAN:  To admit for safety and stabilization.  Once she is a little more  awake, we will work with her regarding her discharge plan whether she  wants to return with the boyfriend or go home to her parents.  We will  increase her Effexor to XL 300 mg p.o. q.a.m.  We will allow for  Neurontin 100 mg p.o. q.3 h., p.r.n. anxiety.  We will give her Abilify  5 mg at h.s. to help with her sleep and to augment the Effexor.  Estimated length of stay is 3-5 days.      Mickie Leonarda Salon, P.A.-C.      Jasmine Pang, M.D.  Electronically Signed    MD/MEDQ  D:  03/08/2009  T:  03/08/2009  Job:  [id number]

## 2011-02-26 NOTE — Discharge Summary (Signed)
NAMELEAH, THORNBERRY              ACCOUNT NO.:  [id number]   MEDICAL RECORD NO.:  [id number]          PATIENT TYPE:  IPS   LOCATION:  0303                          [facility]   PHYSICIAN:  Jasmine Pang, M.D. DATE OF BIRTH:  1981/09/17   DATE OF ADMISSION:  03/07/2009  DATE OF DISCHARGE:  03/09/2009                               DISCHARGE SUMMARY   IDENTIFICATION:  This is a 30 year old divorced, though now engaged  quiet female who was admitted on a voluntary basis on Mar 07, 2009.  The  patient presented to [facility] after overdosing.  Apparently,  she had been arguing with her current fiance.  She took 30 naproxen.  She knew, regretted her decision, and came to the hospital.  She stated  she had been overwhelmed by her sick baby, a 24-month-old son.  Her  stepdaughter has been in and out of the mental hospitals and financial  issues, as well as a conflict with her fiancee.  She has been dealing  with depression since the birth of the baby and feels that the Effexor  is not working.  She denied homicidal ideation or psychosis.  She is  currently up to 150 mg of Effexor a day.  It is prescribed by Dr. Daphine Deutscher  from [facility].  She stated she is tired of everything.  She reports  getting irritable and angry ever since the birth of her son and it is  hard to initiate sleep.  The patient is being treated on an outpatient  basis with Effexor, she has no prior psychiatric history.  For further  information admission information, see psychiatric admission assessment.  Upon admission she was given an Axis I diagnosis of depressive disorder,  NOS.  She was also given an Axis III diagnosis of hypercholesterolemia.   PHYSICAL FINDINGS:  The patient was medically cleared at the ED at [facility].  There were no remarkable findings.  She was healthy and  stable.   HOSPITAL COURSE:  Upon admission, the patient was started on her oral  contraceptive tablets daily.   She was also started on Ambien 5 mg p.o.  q.h.s. p.r.n. may repeat x1.  She was started on Effexor 75 mg p.o. q.  day and Vistaril 25 mg to 50 mg p.o. t.i.d. p.r.n.  On Mar 08, 2009, the  Effexor XR was increased to 300 mg p.o. q.a.m. since she stated she was  already on the 150 mg p.o. q.a.m.  She did not feel the 150 was  completely helping with her depression.  She was also placed on  Neurontin 100 mg p.o. q.3 h. p.r.n. anxiety and Abilify 5 mg at q.h.s.  to act as an adjunctive medication for her depression.  In individual  sessions, the patient was initially disheveled with minimal eye contact.  There was psychomotor retardation.  Speech was soft and slow.  She  denied any suicidal or homicidal ideation.  Mood was depressed and  anxious.  Affect consistent with mood.  There was no evidence of  psychosis or thought disorder.  On Mar 09, 2009,  there was a remarkable  improvement in her mental status.  Sleep was good.  Appetite was good.  Mood was less depressed and less anxious.  Affect was consistent with  mood.  There was no suicidal or homicidal ideation.  No thoughts of self-  injurious behavior.  No auditory or visual hallucinations.  No paranoia  or delusions.  Thoughts were logical and goal-directed.  Thought  content, no predominant theme.  Cognitive was grossly intact.  Insight  good.  Judgment good.  Impulse control good.  The patient wanted to go  home today.  She has to take care of her child tomorrow (Mar 10, 2009)  because her fiancee has to return to work.  She was felt to be safe for  discharge.   DISCHARGE DIAGNOSES:  Axis I: Mood disorder, not otherwise specified.  Axis II: None.  Axis III: Hypercholesterolemia.  Axis IV: Severe (increased stress with family and financial issues,  burden of psychiatric illness).  Axis V: Global assessment of functioning was 50 upon discharge.  GAF was  35 upon admission.  GAF highest past year was 65.   DISCHARGE PLAN:  There was  no specific activity level or dietary  restriction.   POSTHOSPITAL CARE PLANS:  The patient will be seen at the [facility] by Jodelle Red on March 14, 2009 at 8 a.m.   DISCHARGE MEDICATIONS:  1. Birth control pill as directed by her primary care doctor.  2. Effexor XR 300 mg daily.  3. Abilify 5 mg at bedtime.  4. Ambien 5 mg at bedtime, 1-2 pills at night if needed for sleep.  5. Neurontin 100 mg every 4 hours if needed for anxiety.      Jasmine Pang, M.D.  Electronically Signed     BHS/MEDQ  D:  03/10/2009  T:  03/11/2009  Job:  [id number]

## 2011-02-26 NOTE — Discharge Summary (Signed)
[name], FRAISER              ACCOUNT NO.:  [id number]   MEDICAL RECORD NO.:  [id number]          PATIENT TYPE:  INP   LOCATION:  9126                          FACILITY:  [facility]   PHYSICIAN:  Randye Lobo, M.D.   DATE OF BIRTH:  1981-08-20   DATE OF ADMISSION:  07/16/2007  DATE OF DISCHARGE:  07/19/2007                               DISCHARGE SUMMARY   FINAL DIAGNOSIS:  Intrauterine pregnancy at term, rupture of membranes,  failure to progress.   PROCEDURE:  Primary low transverse cesarean section.   SURGEON:  Malva Limes, M.D.   COMPLICATIONS:  None.   This 30 year old G1 P0 presents at [redacted] weeks gestation with spontaneous  rupture of membranes.  The patient's antepartum course up to this point  had been uncomplicated.  She did present to our office with late  prenatal care starting at about 25 weeks and is also a smoker.  She was  counseled about cessation throughout the pregnancy.  Otherwise, the  patient's antepartum course was uncomplicated.  She did have a negative  group B strep culture obtained at 36 weeks.  At this time, the patient  was admitted with noted confirmed rupture of membranes.  The patient  dilated to 8 cm dilation and  stayed like this for 3+ hours.  A  diagnosis of failure to progress was made, and decision was made to  proceed with cesarean section.  The patient was taken to the operating  room on July 16, 2007 where a primary low transverse cesarean section  was performed by Dr. Malva Limes with the delivery of a 6 pound 1  ounce female infant with Apgars of 8 and 9.  Delivery went without  complications.  The patient did have some peritubular adhesions, but the  rest of the anatomy was normal.   The patient's postoperative course was benign without any significant  fevers.  She was felt ready for discharge on postoperative day #3.  She  was sent home on a regular diet, told to decrease activity, told to  continue her vitamins.  She was given  Percocet 1-2 every 4-6 hours as  needed for pain and told she could use ibuprofen up to 600 mg every 6  hours as needed for pain.  Instructions and precautions were reviewed  with the patient.  She was to follow up in our office in 4 weeks.   LABS ON DISCHARGE:  She had a hemoglobin of 10.4, white blood cell count  of 18.5, platelets of 259,000.      Leilani Able, P.A.-C.      Randye Lobo, M.D.  Electronically Signed    MB/MEDQ  D:  08/14/2007  T:  08/15/2007  Job:  [id number]

## 2011-07-07 LAB — URINALYSIS, ROUTINE W REFLEX MICROSCOPIC
Glucose, UA: 100 — AB
Leukocytes, UA: NEGATIVE
pH: 8.5 — ABNORMAL HIGH

## 2011-07-07 LAB — CBC
HCT: 39.9
Hemoglobin: 14.1
MCHC: 35.4
MCV: 90.2
MCV: 91.3
Platelets: 353
RDW: 12.8
WBC: 15.8 — ABNORMAL HIGH

## 2011-07-07 LAB — URINE MICROSCOPIC-ADD ON

## 2011-07-07 LAB — DIFFERENTIAL
Basophils Absolute: 0
Eosinophils Relative: 0
Lymphocytes Relative: 6 — ABNORMAL LOW
Lymphs Abs: 1
Lymphs Abs: 3
Monocytes Absolute: 0.4
Monocytes Relative: 3
Monocytes Relative: 4
Neutro Abs: 5
Neutrophils Relative %: 58

## 2011-07-07 LAB — COMPREHENSIVE METABOLIC PANEL
AST: 77 — ABNORMAL HIGH
Albumin: 3.9
BUN: 3 — ABNORMAL LOW
Calcium: 8.9
Chloride: 103
Creatinine, Ser: 0.75
Creatinine, Ser: 0.83
GFR calc Af Amer: >60
GFR calc non Af Amer: >60
Glucose, Bld: 108 — ABNORMAL HIGH
Total Bilirubin: 1
Total Protein: 6.5
Total Protein: 6.7

## 2011-07-08 LAB — URINALYSIS, ROUTINE W REFLEX MICROSCOPIC
Bilirubin Urine: NEGATIVE
Ketones, ur: NEGATIVE
Nitrite: NEGATIVE
Protein, ur: NEGATIVE
Urobilinogen, UA: 0.2
pH: 6.5

## 2011-07-08 LAB — COMPREHENSIVE METABOLIC PANEL
ALT: 34
AST: 24
CO2: 26
Calcium: 8.7
Chloride: 101
Creatinine, Ser: 0.68
GFR calc non Af Amer: >60
Glucose, Bld: 90
Sodium: 136
Total Bilirubin: 0.6

## 2011-07-08 LAB — LIPASE, BLOOD: Lipase: 14

## 2011-07-08 LAB — CBC
Hemoglobin: 13.7
MCHC: 34.9
MCV: 90.8
RBC: 4.32
WBC: 9.4

## 2011-07-08 LAB — DIFFERENTIAL
Basophils Absolute: 0
Basophils Relative: 0
Eosinophils Absolute: 0.1
Eosinophils Relative: 2
Lymphs Abs: 2
Neutrophils Relative %: 69

## 2011-07-09 ENCOUNTER — Emergency Department (HOSPITAL_COMMUNITY)
Admission: EM | Admit: 2011-07-09 | Discharge: 2011-07-09 | Disposition: A | Discharge status: Home / Self Care | Payer: Self-pay | Attending: Emergency Medicine | Admitting: Emergency Medicine

## 2011-07-09 ENCOUNTER — Emergency Department (HOSPITAL_COMMUNITY): Payer: Self-pay

## 2011-07-09 DIAGNOSIS — R0609 Other forms of dyspnea: Secondary | ICD-10-CM | POA: Insufficient documentation

## 2011-07-09 DIAGNOSIS — R079 Chest pain, unspecified: Secondary | ICD-10-CM | POA: Insufficient documentation

## 2011-07-09 DIAGNOSIS — R0989 Other specified symptoms and signs involving the circulatory and respiratory systems: Secondary | ICD-10-CM | POA: Insufficient documentation

## 2011-07-09 DIAGNOSIS — E7800 Pure hypercholesterolemia, unspecified: Secondary | ICD-10-CM | POA: Insufficient documentation

## 2011-07-09 DIAGNOSIS — R0602 Shortness of breath: Secondary | ICD-10-CM | POA: Insufficient documentation

## 2011-07-09 DIAGNOSIS — R05 Cough: Secondary | ICD-10-CM | POA: Insufficient documentation

## 2011-07-09 DIAGNOSIS — R059 Cough, unspecified: Secondary | ICD-10-CM | POA: Insufficient documentation

## 2011-07-09 LAB — BASIC METABOLIC PANEL
Calcium: 9.4 mg/dL (ref 8.4–10.5)
GFR calc Af Amer: >60 mL/min (ref >60)
GFR calc non Af Amer: >60 mL/min (ref >60)
Glucose, Bld: 112 mg/dL — ABNORMAL HIGH (ref 70–99)
Potassium: 3.6 mEq/L (ref 3.5–5.1)
Sodium: 139 mEq/L (ref 135–145)

## 2011-07-09 LAB — CBC
HCT: 37.9 % (ref 36.0–46.0)
MCHC: 35.1 g/dL (ref 30.0–36.0)
Platelets: 351 10*3/uL (ref 150–400)
RDW: 12.8 % (ref 11.5–15.5)
WBC: 8.8 10*3/uL (ref 4.0–10.5)

## 2011-07-09 LAB — DIFFERENTIAL
Basophils Absolute: 0 10*3/uL (ref 0.0–0.1)
Basophils Relative: 0 % (ref 0–1)
Eosinophils Absolute: 0.3 10*3/uL (ref 0.0–0.7)
Eosinophils Relative: 3 % (ref 0–5)
Lymphocytes Relative: 36 % (ref 12–46)
Monocytes Absolute: 0.5 10*3/uL (ref 0.1–1.0)

## 2011-07-22 LAB — CBC
HCT: 29.3 — ABNORMAL LOW
HCT: 36.2
Hemoglobin: 12.7
Hemoglobin: 13.1
MCHC: 35.2
MCV: 94.9
MCV: 95
RBC: 3.08 — ABNORMAL LOW
RBC: 3.96
RDW: 13.4
WBC: 18.5 — ABNORMAL HIGH

## 2011-07-22 LAB — COMPREHENSIVE METABOLIC PANEL
BUN: 9
Calcium: 8.9
Creatinine, Ser: 0.65
GFR calc non Af Amer: >60
Glucose, Bld: 155 — ABNORMAL HIGH
Total Protein: 6.1

## 2011-07-22 LAB — LACTATE DEHYDROGENASE: LDH: 138

## 2011-07-22 LAB — URIC ACID: Uric Acid, Serum: 4.6

## 2011-07-22 LAB — CCBB MATERNAL DONOR DRAW

## 2011-07-22 LAB — RPR: RPR Ser Ql: NONREACTIVE

## 2011-07-23 LAB — URINALYSIS, ROUTINE W REFLEX MICROSCOPIC
Bilirubin Urine: NEGATIVE
Hgb urine dipstick: NEGATIVE
Nitrite: NEGATIVE
Specific Gravity, Urine: >1.03 — ABNORMAL HIGH
Urobilinogen, UA: 0.2
pH: 6

## 2011-07-27 LAB — URINALYSIS, ROUTINE W REFLEX MICROSCOPIC
Hgb urine dipstick: NEGATIVE
Protein, ur: NEGATIVE
Specific Gravity, Urine: 1.015
Urobilinogen, UA: 0.2

## 2011-08-06 ENCOUNTER — Inpatient Hospital Stay (HOSPITAL_COMMUNITY)
Admission: AD | Admit: 2011-08-06 | Discharge: 2011-08-06 | Disposition: A | Discharge status: Home / Self Care | Payer: Self-pay | Source: Ambulatory Visit | Attending: Obstetrics & Gynecology | Admitting: Obstetrics & Gynecology

## 2011-08-06 ENCOUNTER — Encounter (HOSPITAL_COMMUNITY): Payer: Self-pay | Admitting: *Deleted

## 2011-08-06 ENCOUNTER — Inpatient Hospital Stay (HOSPITAL_COMMUNITY): Payer: Self-pay

## 2011-08-06 DIAGNOSIS — N83209 Unspecified ovarian cyst, unspecified side: Secondary | ICD-10-CM | POA: Insufficient documentation

## 2011-08-06 DIAGNOSIS — R42 Dizziness and giddiness: Secondary | ICD-10-CM | POA: Insufficient documentation

## 2011-08-06 DIAGNOSIS — N949 Unspecified condition associated with female genital organs and menstrual cycle: Secondary | ICD-10-CM | POA: Insufficient documentation

## 2011-08-06 HISTORY — DX: Disease of gallbladder, unspecified: K82.9

## 2011-08-06 HISTORY — DX: Obsessive-compulsive disorder, unspecified: F42.9

## 2011-08-06 HISTORY — DX: Anxiety disorder, unspecified: F41.9

## 2011-08-06 LAB — CBC
MCH: 31.8 pg (ref 26.0–34.0)
MCHC: 34.3 g/dL (ref 30.0–36.0)
Platelets: 406 10*3/uL — ABNORMAL HIGH (ref 150–400)
RDW: 13.1 % (ref 11.5–15.5)

## 2011-08-06 LAB — URINALYSIS, ROUTINE W REFLEX MICROSCOPIC
Glucose, UA: NEGATIVE mg/dL
Ketones, ur: NEGATIVE mg/dL
Leukocytes, UA: NEGATIVE
Nitrite: NEGATIVE
Protein, ur: NEGATIVE mg/dL
Urobilinogen, UA: 0.2 mg/dL (ref 0.0–1.0)

## 2011-08-06 LAB — WET PREP, GENITAL

## 2011-08-06 LAB — POCT PREGNANCY, URINE: Preg Test, Ur: NEGATIVE

## 2011-08-06 MED ORDER — OXYCODONE-ACETAMINOPHEN 5-325 MG PO TABS
2.0000 | ORAL_TABLET | Freq: Once | ORAL | Status: AC
  Administered 2011-08-06: 2 via ORAL
  Filled 2011-08-06: qty 2

## 2011-08-06 MED ORDER — BUTORPHANOL TARTRATE 2 MG/ML IJ SOLN
1.0000 mg | Freq: Once | INTRAMUSCULAR | Status: AC
  Administered 2011-08-06: 20:00:00 via INTRAMUSCULAR
  Filled 2011-08-06: qty 1

## 2011-08-06 NOTE — Progress Notes (Signed)
Pt reports pain not improved, but desires discharge home.

## 2011-08-06 NOTE — ED Provider Notes (Signed)
History     Chief Complaint  Patient presents with  . Dizziness  . Migraine   HPI Dizziness today, pelvic cramping for 3 days, pain is described as sharp and rated a 9/10.  Movement makes pain worse.  Denies UTI symptoms, nausea, vomiting, or diarrhea.   Temporal headache x 3 days, nothing helps (Ibuprofen, Aleve).  Headache increases with light and loud noises.  LMP 07/14/11, lasted 4 days.  Denies bleeding since.     Past Medical History  Diagnosis Date  . Gallbladder problem   . No pertinent past medical history     Past Surgical History  Procedure Date  . Cholecystectomy 2009  . Wisdom teeth extraction     No family history on file.  History  Substance Use Topics  . Smoking status: Current Everyday Smoker -- 0.5 packs/day  . Smokeless tobacco: Not on file  . Alcohol Use: No    Allergies:  Allergies  Allergen Reactions  . Orange Oil Shortness Of Breath  . Carisoprodol     Skin peeled   . Piroxicam     Skin peeled    Prescriptions prior to admission  Medication Sig Dispense Refill  . albuterol (PROVENTIL HFA;VENTOLIN HFA) 108 (90 BASE) MCG/ACT inhaler Inhale 2 puffs into the lungs every 4 (four) hours as needed. Shortness of breath       . busPIRone (BUSPAR) 10 MG tablet Take 15 mg by mouth 2 (two) times daily.        . citalopram (CELEXA) 20 MG tablet Take 20 mg by mouth daily.        Marland Kitchen gabapentin (NEURONTIN) 600 MG tablet Take 600 mg by mouth 3 (three) times daily.        Marland Kitchen lamoTRIgine (LAMICTAL) 200 MG tablet Take 200 mg by mouth daily.        . Multiple Vitamins-Minerals (MULTIVITAMIN WITH MINERALS) tablet Take 1 tablet by mouth daily.          Review of Systems  Constitutional: Negative.   Eyes: Negative.   Respiratory: Negative.   Cardiovascular: Negative.   Gastrointestinal: Positive for abdominal pain. Negative for nausea, vomiting, diarrhea, constipation and blood in stool.  Genitourinary: Negative.   Neurological: Positive for headaches.    Physical Exam   Blood pressure 149/83, pulse 106, temperature 97.6 F (36.4 C), temperature source Oral, resp. rate 18, height 5\' 7"  (1.702 m), weight 202 lb 9.6 oz (91.899 kg), last menstrual period 07/14/2011.  Physical Exam  Constitutional: She is oriented to person, place, and time. She appears well-developed and well-nourished.  HENT:  Head: Normocephalic.  Eyes: Pupils are equal, round, and reactive to light.  Neck: Normal range of motion. Neck supple.  Cardiovascular: Normal rate, regular rhythm and normal heart sounds.   Respiratory: Effort normal and breath sounds normal.  GI: Soft. She exhibits no mass. There is tenderness. There is no guarding.  Genitourinary: Vaginal discharge (white, creamy) found.       Negative cervical motion tenderness  Neurological: She is alert and oriented to person, place, and time. She has normal reflexes.  Skin: Skin is warm and dry.    MAU Course  Procedures  Wet prep - few clue GC/CT - pend Pelvic Ultrasound -  Approximate 3.3 cm hemorrhagic cyst arising from the right  ovary (favored over an endometrioma). A prior ultrasound from  August, 2010 demonstrated a similar appearing right ovarian cyst.   Assessment and Plan  Ovarian Cyst Follow-up in GYN clinic at [facility]  [facility] 08/06/2011, 5:45 PM

## 2011-08-06 NOTE — Progress Notes (Signed)
Dizziness today, cramping for 3 days, migraine x 3 days nothing helps (Ibuprofen, Aleve), LMP 10/3/

## 2011-08-07 LAB — GC/CHLAMYDIA PROBE AMP, GENITAL
Chlamydia, DNA Probe: NEGATIVE
GC Probe Amp, Genital: NEGATIVE

## 2011-09-18 ENCOUNTER — Inpatient Hospital Stay (HOSPITAL_BASED_OUTPATIENT_CLINIC_OR_DEPARTMENT_OTHER)
Admission: EM | Admit: 2011-09-18 | Discharge: 2011-09-20 | DRG: 918 | Disposition: A | Discharge status: Home / Self Care | Payer: Self-pay | Source: Ambulatory Visit | Attending: Internal Medicine | Admitting: Internal Medicine

## 2011-09-18 ENCOUNTER — Other Ambulatory Visit: Payer: Self-pay

## 2011-09-18 ENCOUNTER — Encounter (HOSPITAL_BASED_OUTPATIENT_CLINIC_OR_DEPARTMENT_OTHER): Payer: Self-pay | Admitting: *Deleted

## 2011-09-18 DIAGNOSIS — E785 Hyperlipidemia, unspecified: Secondary | ICD-10-CM | POA: Diagnosis present

## 2011-09-18 DIAGNOSIS — G8929 Other chronic pain: Secondary | ICD-10-CM | POA: Diagnosis present

## 2011-09-18 DIAGNOSIS — T502X1A Poisoning by carbonic-anhydrase inhibitors, benzothiadiazides and other diuretics, accidental (unintentional), initial encounter: Secondary | ICD-10-CM | POA: Diagnosis present

## 2011-09-18 DIAGNOSIS — T426X1A Poisoning by other antiepileptic and sedative-hypnotic drugs, accidental (unintentional), initial encounter: Secondary | ICD-10-CM | POA: Diagnosis present

## 2011-09-18 DIAGNOSIS — M549 Dorsalgia, unspecified: Secondary | ICD-10-CM | POA: Diagnosis present

## 2011-09-18 DIAGNOSIS — R404 Transient alteration of awareness: Secondary | ICD-10-CM | POA: Diagnosis present

## 2011-09-18 DIAGNOSIS — T148XXA Other injury of unspecified body region, initial encounter: Secondary | ICD-10-CM

## 2011-09-18 DIAGNOSIS — F429 Obsessive-compulsive disorder, unspecified: Secondary | ICD-10-CM | POA: Diagnosis present

## 2011-09-18 DIAGNOSIS — F411 Generalized anxiety disorder: Secondary | ICD-10-CM | POA: Diagnosis present

## 2011-09-18 DIAGNOSIS — F329 Major depressive disorder, single episode, unspecified: Secondary | ICD-10-CM | POA: Diagnosis present

## 2011-09-18 DIAGNOSIS — T424X4A Poisoning by benzodiazepines, undetermined, initial encounter: Principal | ICD-10-CM | POA: Diagnosis present

## 2011-09-18 DIAGNOSIS — T50901A Poisoning by unspecified drugs, medicaments and biological substances, accidental (unintentional), initial encounter: Secondary | ICD-10-CM

## 2011-09-18 DIAGNOSIS — F3289 Other specified depressive episodes: Secondary | ICD-10-CM | POA: Diagnosis present

## 2011-09-18 DIAGNOSIS — IMO0002 Reserved for concepts with insufficient information to code with codable children: Secondary | ICD-10-CM

## 2011-09-18 DIAGNOSIS — T503X1A Poisoning by electrolytic, caloric and water-balance agents, accidental (unintentional), initial encounter: Secondary | ICD-10-CM | POA: Diagnosis present

## 2011-09-18 DIAGNOSIS — T424X1A Poisoning by benzodiazepines, accidental (unintentional), initial encounter: Secondary | ICD-10-CM | POA: Diagnosis present

## 2011-09-18 DIAGNOSIS — F17200 Nicotine dependence, unspecified, uncomplicated: Secondary | ICD-10-CM | POA: Diagnosis present

## 2011-09-18 HISTORY — DX: Depression, unspecified: F32.A

## 2011-09-18 HISTORY — DX: Chronic pain syndrome: G89.4

## 2011-09-18 HISTORY — DX: Dorsalgia, unspecified: M54.9

## 2011-09-18 HISTORY — DX: Major depressive disorder, single episode, unspecified: F32.9

## 2011-09-18 LAB — COMPREHENSIVE METABOLIC PANEL
AST: 16 U/L (ref 0–37)
Albumin: 4.9 g/dL (ref 3.5–5.2)
BUN: 10 mg/dL (ref 6–23)
Chloride: 101 mEq/L (ref 96–112)
Creatinine, Ser: 0.6 mg/dL (ref 0.50–1.10)
Total Protein: 8.2 g/dL (ref 6.0–8.3)

## 2011-09-18 LAB — ACETAMINOPHEN LEVEL: Acetaminophen (Tylenol), Serum: <15 ug/mL (ref 10–30)

## 2011-09-18 LAB — DIFFERENTIAL
Basophils Absolute: 0 10*3/uL (ref 0.0–0.1)
Basophils Relative: 0 % (ref 0–1)
Eosinophils Absolute: 0 10*3/uL (ref 0.0–0.7)
Monocytes Absolute: 0.1 10*3/uL (ref 0.1–1.0)
Monocytes Relative: 1 % — ABNORMAL LOW (ref 3–12)
Neutro Abs: 13.4 10*3/uL — ABNORMAL HIGH (ref 1.7–7.7)
Neutrophils Relative %: 91 % — ABNORMAL HIGH (ref 43–77)

## 2011-09-18 LAB — CBC
HCT: 41.3 % (ref 36.0–46.0)
Hemoglobin: 14.6 g/dL (ref 12.0–15.0)
MCH: 31.7 pg (ref 26.0–34.0)
MCHC: 35.4 g/dL (ref 30.0–36.0)
RDW: 12.6 % (ref 11.5–15.5)

## 2011-09-18 LAB — RAPID URINE DRUG SCREEN, HOSP PERFORMED
Benzodiazepines: POSITIVE — AB
Cocaine: NOT DETECTED
Opiates: NOT DETECTED

## 2011-09-18 LAB — SALICYLATE LEVEL: Salicylate Lvl: <2 mg/dL — ABNORMAL LOW (ref 2.8–20.0)

## 2011-09-18 LAB — ETHANOL: Alcohol, Ethyl (B): <11 mg/dL (ref 0–11)

## 2011-09-18 MED ORDER — SODIUM CHLORIDE 0.9 % IV BOLUS (SEPSIS)
1000.0000 mL | Freq: Once | INTRAVENOUS | Status: AC
  Administered 2011-09-18: 1000 mL via INTRAVENOUS

## 2011-09-18 MED ORDER — TETANUS-DIPHTH-ACELL PERTUSSIS 5-2.5-18.5 LF-MCG/0.5 IM SUSP
0.5000 mL | Freq: Once | INTRAMUSCULAR | Status: AC
  Administered 2011-09-18: 0.5 mL via INTRAMUSCULAR
  Filled 2011-09-18: qty 0.5

## 2011-09-18 NOTE — ED Notes (Signed)
Pt seen standing up in room adjusting oxygen. Instructed on CCU/A and walked to bathroom without difficulty.

## 2011-09-18 NOTE — ED Notes (Signed)
Noted pt standing  up with registration staff at door signing registration paper work with out assistance at approximatly  2030 , pt walked to bed with out assistance and had a steady gate. And adjust her self in bed.
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
## 2011-09-18 NOTE — ED Notes (Signed)
Spoke with [name] at [facility]. Recommended supportive care for 6-8 hours until asymptomatic. May see nystagmus, slurred speech, sedation, GI upset, CNS depression and hypotension. Pt placed on monitor with q30 min VS. EKG done. Dr. Hyman Hopes advised of pt shortly after arrival. Pt states she has done this before. Requesting a Fall Risk and Allergy bracelet. Also inquiring as to if she is going to be cathed because she "cannot walk". States she is OCD as well.

## 2011-09-18 NOTE — ED Notes (Signed)
Pt resting quietly. Eyes closed. Awakens easily. Verbalizes no complaints.

## 2011-09-18 NOTE — ED Notes (Signed)
Pt states she took approx 2 handfuls of Neurontin and 1 of Buspirone at around 2000 because "husband was being stupid, I have chronic back pain and I just want the pain to go away" Denies suicidal plan.

## 2011-09-18 NOTE — ED Notes (Signed)
Pt brought to ED via [facility]. According to paramedics, pt took 2 handfuls of Neurontin and 1 handful of Buspirone. Alert upon arrival. VSS

## 2011-09-18 NOTE — ED Notes (Signed)
MD at bedside. 

## 2011-09-19 ENCOUNTER — Encounter (HOSPITAL_COMMUNITY): Payer: Self-pay | Admitting: *Deleted

## 2011-09-19 DIAGNOSIS — T50901A Poisoning by unspecified drugs, medicaments and biological substances, accidental (unintentional), initial encounter: Secondary | ICD-10-CM | POA: Diagnosis present

## 2011-09-19 LAB — BASIC METABOLIC PANEL
BUN: 11 mg/dL (ref 6–23)
BUN: 12 mg/dL (ref 6–23)
Calcium: 9.1 mg/dL (ref 8.4–10.5)
Calcium: 9.7 mg/dL (ref 8.4–10.5)
Calcium: 9.1 mg/dL (ref 8.4–10.5)
Creatinine, Ser: 0.99 mg/dL (ref 0.50–1.10)
Creatinine, Ser: 0.92 mg/dL (ref 0.50–1.10)
GFR calc Af Amer: 88 mL/min — ABNORMAL LOW (ref >90)
GFR calc Af Amer: >90 mL/min (ref >90)
GFR calc non Af Amer: >90 mL/min (ref >90)
GFR calc non Af Amer: 76 mL/min — ABNORMAL LOW (ref >90)
GFR calc non Af Amer: 83 mL/min — ABNORMAL LOW (ref >90)
GFR calc non Af Amer: >90 mL/min (ref >90)
Glucose, Bld: 100 mg/dL — ABNORMAL HIGH (ref 70–99)
Potassium: 3.5 mEq/L (ref 3.5–5.1)
Sodium: 142 mEq/L (ref 135–145)
Sodium: 138 mEq/L (ref 135–145)

## 2011-09-19 LAB — GLUCOSE, CAPILLARY: Glucose-Capillary: 120 mg/dL — ABNORMAL HIGH (ref 70–99)

## 2011-09-19 MED ORDER — ENOXAPARIN SODIUM 40 MG/0.4ML ~~LOC~~ SOLN
40.0000 mg | SUBCUTANEOUS | Status: DC
  Administered 2011-09-19 – 2011-09-20 (×2): 40 mg via SUBCUTANEOUS
  Filled 2011-09-19 (×5): qty 0.4

## 2011-09-19 MED ORDER — PNEUMOCOCCAL VAC POLYVALENT 25 MCG/0.5ML IJ INJ
0.5000 mL | INJECTION | INTRAMUSCULAR | Status: DC
  Filled 2011-09-19: qty 0.5

## 2011-09-19 MED ORDER — ONDANSETRON HCL 4 MG PO TABS: 4.0000 mg | ORAL_TABLET | Freq: Four times a day (QID) | ORAL | Status: DC | PRN

## 2011-09-19 MED ORDER — SODIUM CHLORIDE 0.9 % IV SOLN
INTRAVENOUS | Status: DC
  Administered 2011-09-19: 06:00:00 via INTRAVENOUS
  Administered 2011-09-20: 125 mL via INTRAVENOUS

## 2011-09-19 MED ORDER — ONDANSETRON HCL 4 MG/2ML IJ SOLN: 4.0000 mg | Freq: Four times a day (QID) | INTRAMUSCULAR | Status: DC | PRN

## 2011-09-19 MED ORDER — INSULIN ASPART 100 UNIT/ML ~~LOC~~ SOLN
0.0000 [IU] | SUBCUTANEOUS | Status: DC
  Administered 2011-09-19: 1 [IU] via SUBCUTANEOUS
  Administered 2011-09-19 (×2): 2 [IU] via SUBCUTANEOUS
  Administered 2011-09-20: 1 [IU] via SUBCUTANEOUS
  Filled 2011-09-19: qty 3

## 2011-09-19 MED ORDER — IBUPROFEN 400 MG PO TABS
400.0000 mg | ORAL_TABLET | Freq: Four times a day (QID) | ORAL | Status: DC | PRN
  Filled 2011-09-19: qty 1

## 2011-09-19 MED ORDER — SODIUM CHLORIDE 0.9 % IV SOLN
INTRAVENOUS | Status: DC
  Administered 2011-09-19: 05:00:00 via INTRAVENOUS

## 2011-09-19 MED ORDER — TRAMADOL HCL 50 MG PO TABS
50.0000 mg | ORAL_TABLET | Freq: Four times a day (QID) | ORAL | Status: DC | PRN
  Administered 2011-09-19: 50 mg via ORAL
  Filled 2011-09-19: qty 1

## 2011-09-19 MED ORDER — NICOTINE 21 MG/24HR TD PT24
21.0000 mg | MEDICATED_PATCH | Freq: Every day | TRANSDERMAL | Status: DC
  Administered 2011-09-19: 21 mg via TRANSDERMAL
  Filled 2011-09-19 (×2): qty 1

## 2011-09-19 NOTE — H&P (Signed)
DATE OF ADMISSION:  09/19/2011  PCP:    Lehman Prom, NP, NP   Chief Complaint:    HPI: [name] is an 30 y.o. female who was taken to the [facility] Emergency Department after taking an overdose of her medications. She reported that she was not suicidal and was trying to get pain relief of her back pain which is chronic.  She told the ED Physician that she had taken Valium tablets X 8, HCTZ tablets 25 mg X 10 tablets and 2 handfuls of Neurontin, and 1 handful of Buspar.   Poison Control was contacted by the [facility], and the recommendations were for close monitoring for 8 -12 hours due to risks of airway compromise and tachyarrhythmias, so she was referred for medical admission. Patient has been somnolent but arousable and has been maintaining her airway.  She reports a similar hospitalization 1 1/2 years ago for an overdose in which she had been suicidal.    Past Medical History  Diagnosis Date  . Gallbladder problem   . Anxiety   . Obsessive compulsive disorder   . Depression   . Back pain   . Chronic pain syndrome     Past Surgical History  Procedure Date  . Cholecystectomy 2009  . Wisdom teeth extraction   . Cesarean section     Medications:  HOME MEDS: Prior to Admission medications   Medication Sig Start Date End Date Taking? Authorizing Provider  diazepam (VALIUM) 5 MG tablet Take 5 mg by mouth every 12 (twelve) hours as needed.     Yes Historical Provider, MD  predniSONE (DELTASONE) 20 MG tablet Take 20 mg by mouth daily.     Yes Historical Provider, MD  albuterol (PROVENTIL HFA;VENTOLIN HFA) 108 (90 BASE) MCG/ACT inhaler Inhale 2 puffs into the lungs every 4 (four) hours as needed. Shortness of breath     Historical Provider, MD  busPIRone (BUSPAR) 10 MG tablet Take 15 mg by mouth 2 (two) times daily.      Historical Provider, MD  citalopram (CELEXA) 20 MG tablet Take 20 mg by mouth daily.      Historical Provider, MD  gabapentin (NEURONTIN)  600 MG tablet Take 600 mg by mouth 3 (three) times daily.      Historical Provider, MD  lamoTRIgine (LAMICTAL) 200 MG tablet Take 200 mg by mouth daily.      Historical Provider, MD  Multiple Vitamins-Minerals (MULTIVITAMIN WITH MINERALS) tablet Take 1 tablet by mouth daily.      Historical Provider, MD    Allergies:  Allergies  Allergen Reactions  . Orange Oil Shortness Of Breath  . Carisoprodol     Skin peeled   . Piroxicam     Skin peeled    Social History:   reports that she has been smoking.  She does not have any smokeless tobacco history on file. She reports that she does not drink alcohol or use illicit drugs.  Family History: Family History  Problem Relation Age of Onset  . Diabetes insipidus Father   . Arthritis Father   . Heart failure Father   . COPD Father   . Emphysema Father     Review of Systems:  The patient denies anorexia, fever, weight loss, vision loss, decreased hearing, hoarseness, chest pain, syncope, dyspnea on exertion, peripheral edema, balance deficits, hemoptysis, abdominal pain, melena, hematochezia, severe indigestion/heartburn, hematuria, incontinence, genital sores, muscle weakness, suspicious skin lesions, transient blindness, difficulty walking, depression, unusual weight change, abnormal bleeding,  enlarged lymph nodes, angioedema, and breast masses.   Physical Exam:  GEN:  Somnolent but arousable,  well nourished well developed Caucasian female in no acute distress; cooperative with exam Filed Vitals:   09/19/11 0352 09/19/11 0400 09/19/11 0415 09/19/11 0500  BP:  107/66 106/66 109/67  Pulse: 73 76 74 72  Temp:      TempSrc:      Resp: 18     Height:   5\' 6"  (1.676 m)   Weight:   91.3 kg (201 lb 4.5 oz)   SpO2:  99% 97% 98%   Blood pressure 109/67, pulse 72, temperature 99.3 F (37.4 C), temperature source Oral, resp. rate 18, height 5\' 6"  (1.676 m), weight 91.3 kg (201 lb 4.5 oz), last menstrual period 09/11/2011, SpO2  98.00%. PSYCH: She is somnolent but arousable and oriented x 4; does not appear anxious does not appear depressed; affect is normal HEENT: Normocephalic and Atraumatic, Mucous membranes pink; PERRLA; EOM intact; Fundi:  Benign;  No scleral icterus, Nares: Patent, Oropharynx: Clear, Fair Dentition, Neck:  FROM, no cervical lymphadenopathy nor thyromegaly or carotid bruit; no JVD; Breasts:: Not examined CHEST WALL: No tenderness CHEST: Normal respiration, clear to auscultation bilaterally HEART: Regular rate and rhythm; no murmurs rubs or gallops BACK: No kyphosis or scoliosis; no CVA tenderness ABDOMEN: Positive Bowel Sounds, soft non-tender; no masses, no organomegaly. Rectal Exam: Not done EXTREMITIES: No bone or joint deformity; age-appropriate arthropathy of the hands and knees; no cyanosis, clubbing or edema; no ulcerations. Genitalia: not examined PULSES: 2+ and symmetric SKIN: Normal hydration no rash or ulceration CNS: Cranial nerves 2-12 grossly intact no focal neurologic deficit   Labs & Imaging Results for orders placed during the hospital encounter of 09/18/11 (from the past 48 hour(s))  CBC     Status: Abnormal   Collection Time   09/18/11  9:33 PM      Component Value Range Comment   WBC 14.7 (*) 4.0 - 10.5 (K/uL)    RBC 4.60  3.87 - 5.11 (MIL/uL)    Hemoglobin 14.6  12.0 - 15.0 (g/dL)    HCT 16.1  09.6 - 04.5 (%)    MCV 89.8  78.0 - 100.0 (fL)    MCH 31.7  26.0 - 34.0 (pg)    MCHC 35.4  30.0 - 36.0 (g/dL)    RDW 40.9  81.1 - 91.4 (%)    Platelets 459 (*) 150 - 400 (K/uL)   DIFFERENTIAL     Status: Abnormal   Collection Time   09/18/11  9:33 PM      Component Value Range Comment   Neutrophils Relative 91 (*) 43 - 77 (%)    Neutro Abs 13.4 (*) 1.7 - 7.7 (K/uL)    Lymphocytes Relative 8 (*) 12 - 46 (%)    Lymphs Abs 1.2  0.7 - 4.0 (K/uL)    Monocytes Relative 1 (*) 3 - 12 (%)    Monocytes Absolute 0.1  0.1 - 1.0 (K/uL)    Eosinophils Relative 0  0 - 5 (%)     Eosinophils Absolute 0.0  0.0 - 0.7 (K/uL)    Basophils Relative 0  0 - 1 (%)    Basophils Absolute 0.0  0.0 - 0.1 (K/uL)   COMPREHENSIVE METABOLIC PANEL     Status: Abnormal   Collection Time   09/18/11  9:33 PM      Component Value Range Comment   Sodium 140  135 - 145 (mEq/L)    Potassium  3.9  3.5 - 5.1 (mEq/L)    Chloride 101  96 - 112 (mEq/L)    CO2 26  19 - 32 (mEq/L)    Glucose, Bld 202 (*) 70 - 99 (mg/dL)    BUN 10  6 - 23 (mg/dL)    Creatinine, Ser 9.14  0.50 - 1.10 (mg/dL)    Calcium 78.2 (*) 8.4 - 10.5 (mg/dL)    Total Protein 8.2  6.0 - 8.3 (g/dL)    Albumin 4.9  3.5 - 5.2 (g/dL)    AST 16  0 - 37 (U/L)    ALT 21  0 - 35 (U/L)    Alkaline Phosphatase 73  39 - 117 (U/L)    Total Bilirubin 0.2 (*) 0.3 - 1.2 (mg/dL)    GFR calc non Af Amer >90  >90 (mL/min)    GFR calc Af Amer >90  >90 (mL/min)   ACETAMINOPHEN LEVEL     Status: Normal   Collection Time   09/18/11  9:33 PM      Component Value Range Comment   Acetaminophen (Tylenol), Serum <15.0  10 - 30 (ug/mL)   SALICYLATE LEVEL     Status: Abnormal   Collection Time   09/18/11  9:33 PM      Component Value Range Comment   Salicylate Lvl <2.0 (*) 2.8 - 20.0 (mg/dL)   ETHANOL     Status: Normal   Collection Time   09/18/11  9:33 PM      Component Value Range Comment   Alcohol, Ethyl (B) <11  0 - 11 (mg/dL)   URINE RAPID DRUG SCREEN (HOSP PERFORMED)     Status: Abnormal   Collection Time   09/18/11  9:41 PM      Component Value Range Comment   Opiates NONE DETECTED  NONE DETECTED     Cocaine NONE DETECTED  NONE DETECTED     Benzodiazepines POSITIVE (*) NONE DETECTED     Amphetamines NONE DETECTED  NONE DETECTED     Tetrahydrocannabinol NONE DETECTED  NONE DETECTED     Barbiturates NONE DETECTED  NONE DETECTED    MRSA PCR SCREENING     Status: Normal   Collection Time   09/19/11  3:48 AM      Component Value Range Comment   MRSA by PCR NEGATIVE  NEGATIVE     No results found.    Assessment/Plan: Present on  Admission:  .Overdose of drug/medicinal substance Chronic Pain syndrome Depression Anxiety Leukocytosis  Plan:  Admitted to [facility] Unit Bed for Cardiopulmonary Monitoring.  She has been placed on Suicide Precautions for safety reasons until she is cleared by Psych/Behavioral Health.  IVFs have been ordered for Fluid resusitation.  DVT Prophylaxis has been ordered at time.  Opiods will not be given at this time due to her overdose and her current state of Obtundation.  Neurologic checks will also be performed.  Other plans as per orders.  Monitor Electrolytes since patient took increased HCTZ , and monitor for hypokalemia and or ARF.    CODE STATUS:      FULL CODE       Amarise Lillo C 09/19/2011, 6:14 AM
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
## 2011-09-19 NOTE — Progress Notes (Signed)
Subjective: Patient sen and examined this am. Is awake and alert , feels tired and c/o back pain. denies being suicidal. stabel on tele  Objective:  Vital signs in last 24 hours:  Filed Vitals:   09/19/11 0900 09/19/11 1000 09/19/11 1030 09/19/11 1211  BP:   112/70 111/66  Pulse: 84 86 93 99  Temp:    98.3 F (36.8 C)  TempSrc:    Oral  Resp:    20  Height:      Weight:      SpO2: 99% 96% 95% 99%    Intake/Output from previous day:   Intake/Output Summary (Last 24 hours) at 09/19/11 1338 Last data filed at 09/19/11 1212  Gross per 24 hour  Intake    240 ml  Output      0 ml  Net    240 ml    Physical Exam:  General: middle aged obese female in no acute distress. HEENT: no pallor, no icterus, moist oral mucosa, no JVD, no lymphadenopathy Heart: Normal  s1 &s2  Regular rate and rhythm, without murmurs, rubs, gallops. Lungs: Clear to auscultation bilaterally. Abdomen: Soft, nontender, nondistended, positive bowel sounds. Extremities: No clubbing cyanosis or edema with positive pedal pulses. Paraspinal tenderness to palpation over lower back, SLTR negative Neuro: Alert, awake, oriented x3, nonfocal.   Lab Results:  Basic Metabolic Panel:    Component Value Date/Time   NA 142 09/19/2011 1126   K 3.5 09/19/2011 1126   CL 100 09/19/2011 1126   CO2 32 09/19/2011 1126   BUN 12 09/19/2011 1126   CREATININE 0.99 09/19/2011 1126   GLUCOSE 128* 09/19/2011 1126   CALCIUM 9.6 09/19/2011 1126   CBC:    Component Value Date/Time   WBC 14.7* 09/18/2011 2133   HGB 14.6 09/18/2011 2133   HCT 41.3 09/18/2011 2133   PLT 459* 09/18/2011 2133   MCV 89.8 09/18/2011 2133   NEUTROABS 13.4* 09/18/2011 2133   LYMPHSABS 1.2 09/18/2011 2133   MONOABS 0.1 09/18/2011 2133   EOSABS 0.0 09/18/2011 2133   BASOSABS 0.0 09/18/2011 2133    Recent Results (from the past 240 hour(s))  MRSA PCR SCREENING     Status: Normal   Collection Time   09/19/11  3:48 AM      Component Value Range Status Comment     MRSA by PCR NEGATIVE  NEGATIVE  Final     Studies/Results: No results found.  Medications: Scheduled Meds:   . enoxaparin  40 mg Subcutaneous Q24H  . insulin aspart  0-9 Units Subcutaneous Q4H  . pneumococcal 23 valent vaccine  0.5 mL Intramuscular Tomorrow-1000  . sodium chloride  1,000 mL Intravenous Once  . TDaP  0.5 mL Intramuscular Once   Continuous Infusions:   . sodium chloride 125 mL/hr at 09/19/11 1300  . DISCONTD: sodium chloride 100 mL/hr at 09/19/11 0514   PRN Meds:.ondansetron (ZOFRAN) IV, ondansetron  Assessment:  30 y/o obese female with hx of depression, chr low back pain, anxiety presented with overdose of drugs including benzos, HCTZ and buspirone which she claims to have taken for uncontrolled back pain. patient somnolent on presentation and admitted to stepdown,. Denies being suicidal.   PLAN: .Overdose of drug/medicinal substance patient monitored on stepdown  asymptmatic at present except for appearing tired Stable on tele Labs reviewed , noted for leucocytosis. Afebrile. Will monitor denies being suicidal . Does not answer clearly when asked about being depressed repeatedly. She does have hx of depression and is on medication.  psych consult called. Will follow cotn IV fluids  check chem and LFTs neurochecks  Chronic back pain  informs being on valium without much help. Had last CT done in 09 for her pain and could not get to see a subspeciality due to insurance. Holding narcotics for now  PT eval once more awake.  Anxiety and depression:  holding off meds     LOS: 1 day   [name] 09/19/2011, 1:38 PM

## 2011-09-19 NOTE — Progress Notes (Signed)
Patient unco-operative with care. Insisted on going out to smoke and needing something for pain. Refused PRN ibuprofen.Patient took off monitor leads,refused to put it back on. Patient educated,emotional support given.Informed on call for Dr. Gonzella Lex, new orders received for nicotine patch and tramadol. Patient given feed back on orders,stated "tramadol does not work for me". ".........I need something stronger". Educated to try the tramadol when it comes, she verbalised understanding.

## 2011-09-19 NOTE — ED Notes (Signed)
Report called to 2900 RN

## 2011-09-19 NOTE — Progress Notes (Signed)
Pt found smoking in bathroom.  Instructions given and lighter removed from room. MD notified pt wants to go outside with sitter to smoke, wants pain meds, something to relax, etc. MD states he will come by later this evening to assess for possible transfer.

## 2011-09-20 LAB — BASIC METABOLIC PANEL
BUN: 10 mg/dL (ref 6–23)
CO2: 30 mEq/L (ref 19–32)
Chloride: 101 mEq/L (ref 96–112)
Creatinine, Ser: 0.83 mg/dL (ref 0.50–1.10)

## 2011-09-20 LAB — CBC
HCT: 39.8 % (ref 36.0–46.0)
MCV: 93 fL (ref 78.0–100.0)
RDW: 13.3 % (ref 11.5–15.5)
WBC: 12.5 10*3/uL — ABNORMAL HIGH (ref 4.0–10.5)

## 2011-09-20 LAB — GLUCOSE, CAPILLARY
Glucose-Capillary: 112 mg/dL — ABNORMAL HIGH (ref 70–99)
Glucose-Capillary: 106 mg/dL — ABNORMAL HIGH (ref 70–99)

## 2011-09-20 NOTE — Progress Notes (Signed)
Pt has been very calm today - No further telephone conversations with yelling, screaming, etc.  Awaiting friend to pick her up.  Discussed with pt alternatives to suicide.  No Harm contract signed by pt.  All belongings returned as well as meds held by pharmacy.  Discharge teaching done and handouts given.

## 2011-09-20 NOTE — Progress Notes (Signed)
09/20/2011 [facility], Bosie Clos [facility] Case Management Note [phone]    CARE MANAGEMENT NOTE 09/20/2011  Patient:  [name], [name]   Account Number:  [id number]  Date Initiated:  09/20/2011  Documentation initiated by:  Fransico Michael  Subjective/Objective Assessment:   admitted 09/18/11 with c/o overdose     Action/Plan:   prior to admission, patient lived at home with support from family and friends.   Anticipated DC Date:  09/20/2011   Anticipated DC Plan:  HOME/SELF CARE      DC Planning Services  CM consult      Choice offered to / List presented to:             Status of service:  Completed, signed off Medicare Important Message given?   (If response is "NO", the following Medicare IM given date fields will be blank) Date Medicare IM given:   Date Additional Medicare IM given:    Discharge Disposition:  HOME/SELF CARE  Per UR Regulation:  Reviewed for med. necessity/level of care/duration of stay  Comments:  09/20/11- 1021-J.Lutricia Horsfall  [phone]      30yo female patient admitted on 09/18/11 with c/o overdose. Prior to admission, patient lived at home with support from family and friends. Plan is to discharge today to home with self care. No further discharge needs identified.

## 2011-09-20 NOTE — Discharge Summary (Addendum)
Patient ID: [name] MRN: [id number] DOB/AGE: 30-28-1982 30 y.o.  Admit date: 09/18/2011 Discharge date: 09/20/2011  Primary Care Physician:    Discharge Diagnoses:    Present on Admission:  .Overdose of drug/medicinal substance  Secondary discharge diagnosis hyperlipidemia  depression  anxiety  tobacco abuse Chronic back pain  Current Discharge Medication List    CONTINUE these medications which have NOT CHANGED   Details  busPIRone (BUSPAR) 10 MG tablet Take 15 mg by mouth 2 (two) times daily.     citalopram (CELEXA) 20 MG tablet Take 20 mg by mouth daily.     diazepam (VALIUM) 5 MG tablet Take 5 mg by mouth every 12 (twelve) hours as needed. Back pain    gabapentin (NEURONTIN) 600 MG tablet Take 600-1,200 mg by mouth 3 (three) times daily. 600 mg morning and noon, 1200 mg at night    lamoTRIgine (LAMICTAL) 200 MG tablet Take 200 mg by mouth daily.     Multiple Vitamins-Minerals (MULTIVITAMINS THER. W/MINERALS) TABS Take 1 tablet by mouth daily.      predniSONE (DELTASONE) 20 MG tablet Take 20 mg by mouth daily.         Disposition and Follow-up:  Follow up with PCP in 1-2 weeks Patient informs having a psychiatrist and should follow up in 2-4  weeks  Consults:   Wonda Cerise (Psychiatry)  Significant Diagnostic Studies:  No results found.  Brief H and P: For complete details please refer to admission H and P, but in brief [name] is an 30 y.o. female who was taken to the [facility] Emergency Department after taking an overdose of her medications. She reported that she was not suicidal and was trying to get pain relief of her back pain which is chronic. She told the ED Physician that she had taken Valium tablets X 8, HCTZ tablets 25 mg X 10 tablets and 2 handfuls of Neurontin, and 1 handful of Buspar. Poison Control was contacted by the [facility], and the recommendations were for close monitoring for 8 -12 hours due to risks  of airway compromise and tachyarrhythmias, so she was referred for medical admission. Patient has been somnolent but arousable and has been maintaining her airway. She reports a similar hospitalization 1 1/2 years ago for an overdose in which she had been suicidal.    Physical Exam on Discharge:  Filed Vitals:   09/20/11 0000 09/20/11 0400 09/20/11 0606 09/20/11 0750  BP: 118/75 119/75 110/72 111/69  Pulse:  77  96  Temp: 98.2 F (36.8 C) 98.8 F (37.1 C)  97.7 F (36.5 C)  TempSrc: Oral Oral  Oral  Resp:  18    Height:      Weight:    91.3 kg (201 lb 4.5 oz)  SpO2: 95% 97%  98%     Intake/Output Summary (Last 24 hours) at 09/20/11 0836 Last data filed at 09/20/11 0800  Gross per 24 hour  Intake   3515 ml  Output    905 ml  Net   2610 ml    General: Alert, awake, oriented x3, in no acute distress. HEENT: No bruits, no goiter. Heart: Regular rate and rhythm, without murmurs, rubs, gallops. Lungs: Clear to auscultation bilaterally. Abdomen: Soft, nontender, nondistended, positive bowel sounds. Extremities: No clubbing cyanosis or edema with positive pedal pulses. Neuro: Grossly intact, nonfocal.  CBC:    Component Value Date/Time   WBC 12.5* 09/20/2011 0533   HGB 13.4 09/20/2011 0533  HCT 39.8 09/20/2011 0533   PLT 416* 09/20/2011 0533   MCV 93.0 09/20/2011 0533   NEUTROABS 13.4* 09/18/2011 2133   LYMPHSABS 1.2 09/18/2011 2133   MONOABS 0.1 09/18/2011 2133   EOSABS 0.0 09/18/2011 2133   BASOSABS 0.0 09/18/2011 2133    Basic Metabolic Panel:    Component Value Date/Time   NA 139 09/20/2011 0533   K 3.5 09/20/2011 0533   CL 101 09/20/2011 0533   CO2 30 09/20/2011 0533   BUN 10 09/20/2011 0533   CREATININE 0.83 09/20/2011 0533   GLUCOSE 99 09/20/2011 0533   CALCIUM 9.2 09/20/2011 0533    Hospital Course:   .Overdose of drug/medicinal substance  patient monitored on stepdown  She was somnolent on presentation and was closely monitored. Discontinued all home  medications and given IV fluids  poison control was notified. Stable on tele  Labs reviewed and unremarkable , noted for mild leucocytosis but  Afebrile.  -patient denied being suicidal . She however did admit being depressed and took those medications due to persistent back pain.  psych consult called who evaluated the patient and recommended safe to discharge home once contacting her husband and verifying he feels afe to take her home. i spoke to her husband Onalee Hua today who informed me that she is safe to be at home with him and she has a psychiatrist whom she will follow up as outpatient.  Chronic back pain  informs being on valium without much help. Had last CT done in 09 for her pain and could not get to see a subspeciality due to insurance.  Held narcotics i discussed options of PT with her and she says it does not help. She follows with her PCP as outpatient and gets pain meds  There ( She told me yesterday she sees a physician in downtown [place], while today she informed the nurse before discharge that she isnt seeing anyone now??). She is trying get a ortho eval as outpatient. PT eval once more awake.    patient clinically stable and can be discharged home with outpt follow up  she can resume her home medications on discharge  Time spent on Discharge: 45 minutes  Signed: Gavriel Holzhauer 09/20/2011, 8:36 AM

## 2011-09-20 NOTE — Progress Notes (Signed)
CSW with Psychiatry Service:  Attempted to follow up with patient this am due to patient being dc today.  Was able to speak with husband regarding patient recent admission and ?overdose/suicide attempt.  Husband reports patient was doing really well at home, taking her medications, seeing a counselor and this was not a suicide attempt, but in effort to control  Back pain.  Reports patient has a very high pain tolerance and the Valium (which was recently prescribed by a psychiatrist in the out patient) is not a good choice for her because she will abuse the medication.  Reports if three valium do not work, she will take three more in efforts to control the pain.  Reports no other safety concerns at this time and feels safe to bring her home.  Plan is for dc today per review of chart.  Placed outpatient resources on discharge paperwork including mental health for crisis events and providers if she does not have insurance and also another provider for counseling.  Patient resting comfortably in room, continue to deny this as an SI attempt is ready to leave hospital.  No other needs at this time.  Ashley Jacobs, MSW LCSWA [phone]

## 2011-09-21 LAB — URINE CULTURE
Colony Count: 45000
Culture  Setup Time: 201212100242

## 2011-09-21 NOTE — Progress Notes (Signed)
Retro ur review 

## 2011-09-23 ENCOUNTER — Encounter: Payer: Self-pay | Admitting: Obstetrics & Gynecology

## 2011-09-23 ENCOUNTER — Ambulatory Visit (INDEPENDENT_AMBULATORY_CARE_PROVIDER_SITE_OTHER): Payer: PRIVATE HEALTH INSURANCE | Admitting: Obstetrics & Gynecology

## 2011-09-23 VITALS — BP 120/81 | Temp 97.1°F | Ht 66.0 in | Wt 203.0 lb

## 2011-09-23 DIAGNOSIS — N83209 Unspecified ovarian cyst, unspecified side: Secondary | ICD-10-CM

## 2011-09-23 MED ORDER — HYDROCODONE-ACETAMINOPHEN 5-500 MG PO TABS: 1.0000 | ORAL_TABLET | ORAL | Status: AC | PRN

## 2011-09-23 NOTE — Progress Notes (Signed)
History:  30 y.o. G1P1001 here today for follow up of right ovarian hemorrhagic cyst that was seen on ultrasound in 07/2011 during evaluation for pain in the MAU.  She had a similar cyst in 2010, patient reports that she thinks this is the same cyst.    The following portions of the patient's history were reviewed and updated as appropriate: allergies, current medications, past family history, past medical history, past social history, past surgical history and problem list.   Objective:  Physical Exam Blood pressure 120/81, temperature 97.1 F (36.2 C), temperature source Oral, height 5\' 6"  (1.676 m), weight 203 lb (92.08 kg), last menstrual period 09/11/2011. Gen: NAD Abd: Soft, nondistended, moderate RLQ tenderness, voluntary guarding Pelvic/Bimanual: Right adnexal tenderness, unable to palpate any masses secondary to voluntary guarding.  Small uterus, no uterine or right adnexal tenderness.  Labs and Imaging 08/06/11 TRANSABDOMINAL AND TRANSVAGINAL ULTRASOUND OF PELVIS  Uterus: Normal in size and appearance without focal myometrial abnormality, measuring approximately 8.4 x 2.3 x 4.0 cm.  Endometrium: Normal in appearance measuring approximately 9 mm in thickness. No endometrial fluid or mass.  Right ovary: Normal in size measuring approximately 4.0 x 3.8 x 4.0 cm. It contains an approximate 3.2 x 3.3 x 3.3 cm hypoechoic mass with internal echoes. This is similar in appearance to the  prior ultrasound. Normal color Doppler signal is present within the ovary.  Left ovary: Normal in size measuring approximately 2.8 x 1.6 x 2.6 cm. Small follicular cysts. No dominant cyst or solid mass. Other findings: Trace, physiologic free fluid in the cul-de-sac.  IMPRESSION: 1. Approximate 3.3 cm hemorrhagic cyst arising from the right ovary (favored over an endometrioma). A prior ultrasound from August, 2010 demonstrated a similar appearing right ovarian cyst.  2. Otherwise normal examination.     Assessment & Plan:  Will obtain follow up ultrasound.  If cyst still persistent, patient is interested in surgery.  Will discuss once results are obtained.

## 2011-09-23 NOTE — Patient Instructions (Signed)
Ovarian Cyst The ovaries are small organs that are on each side of the uterus. The ovaries are the organs that produce the female hormones, estrogen and progesterone. An ovarian cyst is a sac filled with fluid that can vary in its size. It is normal for a small cyst to form in women who are in the childbearing age and who have menstrual periods. This type of cyst is called a follicle cyst that becomes an ovulation cyst (corpus luteum cyst) after it produces the women's egg. It later goes away on its own if the woman does not become pregnant. There are other kinds of ovarian cysts that may cause problems and may need to be treated. The most serious problem is a cyst with cancer. It should be noted that menopausal women who have an ovarian cyst are at a higher risk of it being a cancer cyst. They should be evaluated very quickly, thoroughly and followed closely. This is especially true in menopausal women because of the high rate of ovarian cancer in women in menopause. CAUSES AND TYPES OF OVARIAN CYSTS:  FUNCTIONAL CYST: The follicle/corpus luteum cyst is a functional cyst that occurs every month during ovulation with the menstrual cycle. They go away with the next menstrual cycle if the woman does not get pregnant. Usually, there are no symptoms with a functional cyst.   ENDOMETRIOMA CYST: This cyst develops from the lining of the uterus tissue. This cyst gets in or on the ovary. It grows every month from the bleeding during the menstrual period. It is also called a "chocolate cyst" because it becomes filled with blood that turns brown. This cyst can cause pain in the lower abdomen during intercourse and with your menstrual period.   CYSTADENOMA CYST: This cyst develops from the cells on the outside of the ovary. They usually are not cancerous. They can get very big and cause lower abdomen pain and pain with intercourse. This type of cyst can twist on itself, cut off its blood supply and cause severe pain.  It also can easily rupture and cause a lot of pain.   DERMOID CYST: This type of cyst is sometimes found in both ovaries. They are found to have different kinds of body tissue in the cyst. The tissue includes skin, teeth, hair, and/or cartilage. They usually do not have symptoms unless they get very big. Dermoid cysts are rarely cancerous.   POLYCYSTIC OVARY: This is a rare condition with hormone problems that produces many small cysts on both ovaries. The cysts are follicle-like cysts that never produce an egg and become a corpus luteum. It can cause an increase in body weight, infertility, acne, increase in body and facial hair and lack of menstrual periods or rare menstrual periods. Many women with this problem develop type 2 diabetes. The exact cause of this problem is unknown. A polycystic ovary is rarely cancerous.   THECA LUTEIN CYST: Occurs when too much hormone (human chorionic gonadotropin) is produced and over-stimulates the ovaries to produce an egg. They are frequently seen when doctors stimulate the ovaries for invitro-fertilization (test tube babies).   LUTEOMA CYST: This cyst is seen during pregnancy. Rarely it can cause an obstruction to the birth canal during labor and delivery. They usually go away after delivery.  SYMPTOMS   Pelvic pain or pressure.   Pain during sexual intercourse.   Increasing girth (swelling) of the abdomen.   Abnormal menstrual periods.   Increasing pain with menstrual periods.   You stop having   menstrual periods and you are not pregnant.  DIAGNOSIS  The diagnosis can be made during:  Routine or annual pelvic examination (common).   Ultrasound.   X-ray of the pelvis.   CT Scan.   MRI.   Blood tests.  TREATMENT   Treatment may only be to follow the cyst monthly for 2 to 3 months with your caregiver. Many go away on their own, especially functional cysts.   May be aspirated (drained) with a long needle with ultrasound, or by laparoscopy  (inserting a tube into the pelvis through a small incision).   The whole cyst can be removed by laparoscopy.   Sometimes the cyst may need to be removed through an incision in the lower abdomen.   Hormone treatment is sometimes used to help dissolve certain cysts.   Birth control pills are sometimes used to help dissolve certain cysts.  HOME CARE INSTRUCTIONS  Follow your caregiver's advice regarding:  Medicine.   Follow up visits to evaluate and treat the cyst.   You may need to come back or make an appointment with another caregiver, to find the exact cause of your cyst, if your caregiver is not a gynecologist.   Get your yearly and recommended pelvic examinations and Pap tests.   Let your caregiver know if you have had an ovarian cyst in the past.  SEEK MEDICAL CARE IF:   Your periods are late, irregular, they stop, or are painful.   Your stomach (abdomen) or pelvic pain does not go away.   Your stomach becomes larger or swollen.   You have pressure on your bladder or trouble emptying your bladder completely.   You have painful sexual intercourse.   You have feelings of fullness, pressure, or discomfort in your stomach.   You lose weight for no apparent reason.   You feel generally ill.   You become constipated.   You lose your appetite.   You develop acne.   You have an increase in body and facial hair.   You are gaining weight, without changing your exercise and eating habits.   You think you are pregnant.  SEEK IMMEDIATE MEDICAL CARE IF:   You have increasing abdominal pain.   You feel sick to your stomach (nausea) and/or vomit.   You develop a fever that comes on suddenly.   You develop abdominal pain during a bowel movement.   Your menstrual periods become heavier than usual.  Document Released: 09/27/2005 Document Revised: 06/09/2011 Document Reviewed: 07/31/2009 [facility] Patient Information 2012 [place], [place].  Preventative Care for Adults,  Female A healthy lifestyle and preventative care can promote health and wellness. Preventative health guidelines for women include the following key practices:  A routine yearly physical is a good way to check with your caregiver about your health and preventative screening. It is a chance to share any concerns and updates on your health, and to receive a thorough exam.   Visit your dentist for a routine exam and preventative care every 6 months. Brush your teeth twice a day and floss once a day. Good oral hygiene prevents tooth decay and gum disease.   The frequency of eye exams is based on your age, health, family medical history, use of contact lenses, and other factors. Follow your caregiver's recommendations for frequency of eye exams.   Eat a healthy diet. Foods like vegetables, fruits, whole grains, low-fat dairy products, and lean protein foods contain the nutrients you need without too many calories. Decrease your intake of  foods high in solid fats, added sugars, and salt. Eat the right amount of calories for you.Get information about a proper diet from your caregiver, if necessary.   Regular physical exercise is one of the most important things you can do for your health. Most adults should get at least 150 minutes of moderate-intensity exercise (any activity that increases your heart rate and causes you to sweat) each week. In addition, most adults need muscle-strengthening exercises on 2 or more days a week.   Maintain a healthy weight. The body mass index (BMI) is a screening tool to identify possible weight problems. It provides an estimate of body fat based on height and weight. Your caregiver can help determine your BMI, and can help you achieve or maintain a healthy weight.For adults 20 years and older:   A BMI below 18.5 is considered underweight.   A BMI of 18.5 to 24.9 is normal.   A BMI of 25 to 29.9 is considered overweight.   A BMI of 30 and above is considered obese.     Maintain normal blood lipids and cholesterol levels by exercising and minimizing your intake of saturated fat. Eat a balanced diet with plenty of fruit and vegetables. Blood tests for lipids and cholesterol should begin at age 60 and be repeated every 5 years. If your lipid or cholesterol levels are high, you are over 50, or you are a high risk for heart disease, you may need your cholesterol levels checked more frequently.Ongoing high lipid and cholesterol levels should be treated with medicines if diet and exercise are not effective.   If you smoke, find out from your caregiver how to quit. If you do not use tobacco, do not start.   If you are pregnant, do not drink alcohol. If you are breastfeeding, be very cautious about drinking alcohol. If you are not pregnant and choose to drink alcohol, do not exceed 1 drink per day. One drink is considered to be 12 ounces (355 mL) of beer, 5 ounces (148 mL) of wine, or 1.5 ounces (44 mL) of liquor.   Avoid use of street drugs. Do not share needles with anyone. Ask for help if you need support or instructions about stopping the use of drugs.   High blood pressure causes heart disease and increases the risk of stroke. Your blood pressure should be checked at least every 1 to 2 years. Ongoing high blood pressure should be treated with medicines if weight loss and exercise are not effective.   If you are 22 to 30 years old, ask your caregiver if you should take aspirin to prevent strokes.   Diabetes screening involves taking a blood sample to check your fasting blood sugar level. This should be done once every 3 years, after age 40, if you are within normal weight and without risk factors for diabetes. Testing should be considered at a younger age or be carried out more frequently if you are overweight and have at least 1 risk factor for diabetes.   Breast cancer screening is essential preventative care for women. You should practice "breast self-awareness."  This means understanding the normal appearance and feel of your breasts and may include breast self-examination. Any changes detected, no matter how small, should be reported to a caregiver. Women in their 73s and 30s should have a clinical breast exam (CBE) by a caregiver as part of a regular health exam every 1 to 3 years. After age 54, women should have a CBE every year.  Starting at age 52, women should consider having a mammogram (breast X-ray) every year. Women who have a family history of breast cancer should talk to their caregiver about genetic screening. Women at a high risk of breast cancer should talk to their caregiver about having an MRI and a mammogram every year.   The Pap test is a screening test for cervical cancer. A Pap test can show cell changes on the cervix that might become cervical cancer if left untreated. A Pap test is a procedure in which cells are obtained and examined from the lower end of the uterus (cervix).   Women should have a Pap test starting at age 46.   Between ages 41 and 21, Pap tests should be repeated every 2 years.   Beginning at age 43, you should have a Pap test every 3 years as long as the past 3 Pap tests have been normal.   Some women have medical problems that increase the chance of getting cervical cancer. Talk to your caregiver about these problems. It is especially important to talk to your caregiver if a new problem develops soon after your last Pap test. In these cases, your caregiver may recommend more frequent screening and Pap tests.   The above recommendations are the same for women who have or have not gotten the vaccine for human papillomavirus (HPV).   If you had a hysterectomy for a problem that was not cancer or a condition that could lead to cancer, then you no longer need Pap tests. Even if you no longer need a Pap test, a regular exam is a good idea to make sure no other problems are starting.   If you are between ages 81 and 40, and  you have had normal Pap tests going back 10 years, you no longer need Pap tests. Even if you no longer need a Pap test, a regular exam is a good idea to make sure no other problems are starting.   If you have had past treatment for cervical cancer or a condition that could lead to cancer, you need Pap tests and screening for cancer for at least 20 years after your treatment.   If Pap tests have been discontinued, risk factors (such as a new sexual partner) need to be reassessed to determine if screening should be resumed.   The HPV test is an additional test that may be used for cervical cancer screening. The HPV test looks for the virus that can cause the cell changes on the cervix. The cells collected during the Pap test can be tested for HPV. The HPV test could be used to screen women aged 53 years and older, and should be used in women of any age who have unclear Pap test results. After the age of 18, women should have HPV testing at the same frequency as a Pap test.   Colorectal cancer can be detected and often prevented. Most routine colorectal cancer screening begins at the age of 20 and continues through age 29. However, your caregiver may recommend screening at an earlier age if you have risk factors for colon cancer. On a yearly basis, your caregiver may provide home test kits to check for hidden blood in the stool. Use of a small camera at the end of a tube, to directly examine the colon (sigmoidoscopy or colonoscopy), can detect the earliest forms of colorectal cancer. Talk to your caregiver about this at age 61, when routine screening begins. Direct examination of the  colon should be repeated every 5 to 10 years through age 82, unless early forms of pre-cancerous polyps or small growths are found.   Practice safe sex. Use condoms and avoid high-risk sexual practices to reduce the spread of sexually transmitted infections (STIs). STIs include gonorrhea, chlamydia, syphilis, trichomonas,  herpes, HPV, and human immunodeficiency virus (HIV). Herpes, HIV, and HPV are viral illnesses that have no cure. They can result in disability, cancer, and death. Sexually active women aged 63 and younger should be checked for Chlamydia. Older women with new or multiple partners should also be tested for Chlamydia. Testing for other STIs is recommended if you are sexually active and at increased risk.   Osteoporosis is a disease in which the bones lose minerals and strength with aging. This can result in serious bone fractures. The risk of osteoporosis can be identified using a bone density scan. Women ages 49 and over and women at risk for fractures or osteoporosis should discuss screening with their caregivers. Ask your caregiver whether you should take a calcium supplement or vitamin D to reduce the rate of osteoporosis.   Menopause can be associated with physical symptoms and risks. Hormone replacement therapy is available to decrease symptoms and risks. You should talk to your caregiver about whether hormone replacement therapy is right for you.   Use sunscreen with skin protection factor (SPF) of 30 or more. Apply sunscreen liberally and repeatedly throughout the day. You should seek shade when your shadow is shorter than you. Protect yourself by wearing long sleeves, pants, a wide-brimmed hat, and sunglasses year round, whenever you are outdoors.   Once a month, do a whole body skin exam, using a mirror to look at the skin on your back. Notify your caregiver of new moles, moles that have irregular borders, moles that are larger than a pencil eraser, or moles that have changed in shape or color.   Stay current with required immunizations.   Influenza. You need a dose every fall (or winter). The composition of the flu vaccine changes each year, so being vaccinated once is not enough.   Pneumococcal polysaccharide. You need 1 to 2 doses if you smoke cigarettes or if you have certain chronic medical  conditions. You need 1 dose at age 34 (or older) if you have never been vaccinated.   Tetanus, diphtheria, pertussis (Tdap, Td). Get 1 dose of Tdap vaccine if you are younger than age 20 years, are over 74 and have contact with an infant, are a Research scientist (physical sciences), are pregnant, or simply want to be protected from whooping cough. After that, you need a Td booster dose every 10 years. Consult your caregiver if you have not had at least 3 tetanus and diphtheria-containing shots sometime in your life or have a deep or dirty wound.   HPV. You need this vaccine if you are a woman age 69 years or younger. The vaccine is given in 3 doses over 6 months.   Measles, mumps, rubella (MMR). You need at least 1 dose of MMR if you were born in 1957 or later. You may also need a 2nd dose.   Meningococcal. If you are age 48 to 43 years and a Orthoptist living in a residence hall, or have one of several medical conditions, you need to get vaccinated against meningococcal disease. You may also need additional booster doses.   Zoster (shingles). If you are age 37 years or older, you should get this vaccine.   Varicella (chickenpox).  If you have never had chickenpox or you were vaccinated but received only 1 dose, talk to your caregiver to find out if you need this vaccine.   Hepatitis A. You need this vaccine if you have a specific risk factor for hepatitis A virus infection or you simply wish to be protected from this disease. The vaccine is usually given as 2 doses, 6 to 18 months apart.   Hepatitis B. You need this vaccine if you have a specific risk factor for hepatitis B virus infection or you simply wish to be protected from this disease. The vaccine is given in 3 doses, usually over 6 months.  Preventative Services / Frequency Ages 63 to 78  Blood pressure check.** / Every 1 to 2 years.   Lipid and cholesterol check.**/ Every 5 years beginning at age 51.   Clinical breast exam.** / Every 3  years for women in their 11s and 30s.   Pap Test.** / Every 2 years from ages 37 through 22. Every 3 years starting at age 69 years through age 46 or 41 with a history of 3 consecutive normal Pap tests.   HPV Screening.** / Every 3 years from ages 56 through ages 33 to 47 with a history of 3 consecutive normal Pap tests.   Skin self-exam. / Monthly.   Influenza immunization.** / Every year.   Pneumococcal polysaccharide immunization.** / 1 to 2 doses if you smoke cigarettes or if you have certain chronic medical conditions.   Tetanus, diphtheria, pertussis (Tdap,Td) immunization. / A one-time dose of Tdap vaccine. After that, you need a Td booster dose every 10 years.   HPV immunization. / 3 doses over 6 months, if 26 and younger.   Measles, mumps, rubella (MMR) immunization. / You need at least 1 dose of MMR if you were born in 1957 or later. You may also need a 2nd dose.   Meningococcal immunization. / 1 dose if you are age 3 to 37 years and a Orthoptist living in a residence hall, or have one of several medical conditions, you need to get vaccinated against meningococcal disease. You may also need additional booster doses.   Varicella immunization. **/ Consult your caregiver.   Hepatitis A immunization. ** / Consult your caregiver. 2 doses, 6 to 18 months apart.   Hepatitis B immunization.** / Consult your caregiver. 3 doses usually over 6 months.  Ages 66 to 7  Blood pressure check.** / Every 1 to 2 years.   Lipid and cholesterol check.**/ Every 5 years beginning at age 41.   Clinical breast exam.** / Every year after age 49.   Mammogram.** / Every year beginning at age 67 and continuing for as long as you are in good health. Consult with your caregiver.   Pap Test.** / Every 3 years starting at age 14 years through age 10 or 69 with a history of 3 consecutive normal Pap tests.   HPV Screening.** / Every 3 years from ages 26 through ages 29 to 13 with a  history of 3 consecutive normal Pap tests.   Fecal occult blood test (FOBT) of stool. / Every year beginning at age 71 and continuing until age 10. You may not have to do this test if you get colonoscopy every 10 years.   Flexible sigmoidoscopy** or colonoscopy.** / Every 5 years for a flexible sigmoidoscopy or every 10 years for a colonoscopy beginning at age 82 and continuing until age 36.   Skin self-exam. /  Monthly.   Influenza immunization.** / Every year.   Pneumococcal polysaccharide immunization.** / 1 to 2 doses if you smoke cigarettes or if you have certain chronic medical conditions.   Tetanus, diphtheria, pertussis (Tdap/Td) immunization.** / A one-time dose of Tdap vaccine. After that, you need a Td booster dose every 10 years.   Measles, mumps, rubella (MMR) immunization. / You need at least 1 dose of MMR if you were born in 1957 or later. You may also need a 2nd dose.   Varicella immunization. **/ Consult your caregiver.   Meningococcal immunization.** / Consult your caregiver.     Hepatitis A immunization. ** / Consult your caregiver. 2 doses, 6 to 18 months apart.   Hepatitis B immunization.** / Consult your caregiver. 3 doses, usually over 6 months.  Ages 46 and over  Blood pressure check.** / Every 1 to 2 years.   Lipid and cholesterol check.**/ Every 5 years beginning at age 95.   Clinical breast exam.** / Every year after age 79.   Mammogram.** / Every year beginning at age 33 and continuing for as long as you are in good health. Consult with your caregiver.   Pap Test,** / Every 3 years starting at age 74 years through age 67 or 60 with a 3 consecutive normal Pap tests. Testing can be stopped between 65 and 70 with 3 consecutive normal Pap tests and no abnormal Pap or HPV tests in the past 10 years.   HPV Screening.** / Every 3 years from ages 3 through ages 59 or 59 with a history of 3 consecutive normal Pap tests. Testing can be stopped between 65 and 70  with 3 consecutive normal Pap tests and no abnormal Pap or HPV tests in the past 10 years.   Fecal occult blood test (FOBT) of stool. / Every year beginning at age 90 and continuing until age 32. You may not have to do this test if you get colonoscopy every 10 years.   Flexible sigmoidoscopy** or colonoscopy.** / Every 5 years for a flexible sigmoidoscopy or every 10 years for a colonoscopy beginning at age 64 and continuing until age 58.   Osteoporosis screening.** / A one-time screening for women ages 57 and over and women at risk for fractures or osteoporosis.   Skin self-exam. / Monthly.   Influenza immunization.** / Every year.   Pneumococcal polysaccharide immunization.** / 1 dose at age 26 (or older) if you have never been vaccinated.   Tetanus, diphtheria, pertussis (Tdap, Td) immunization. / A one-time dose of Tdap vaccine if you are over 65 and have contact with an infant, are a Research scientist (physical sciences), or simply want to be protected from whooping cough. After that, you need a Td booster dose every 10 years.   Varicella immunization. **/ Consult your caregiver.   Meningococcal immunization.** / Consult your caregiver.   Hepatitis A immunization. ** / Consult your caregiver. 2 doses, 6 to 18 months apart.   Hepatitis B immunization.** / Check with your caregiver. 3 doses, usually over 6 months.  ** Family history and personal history of risk and conditions may change your caregiver's recommendations. Document Released: 11/23/2001 Document Revised: 06/09/2011 Document Reviewed: 02/22/2011 [facility] Patient Information 2012 [place], [place].

## 2011-09-28 ENCOUNTER — Other Ambulatory Visit: Payer: Self-pay | Admitting: Obstetrics & Gynecology

## 2011-09-29 ENCOUNTER — Ambulatory Visit (HOSPITAL_COMMUNITY)
Admission: RE | Admit: 2011-09-29 | Discharge: 2011-09-29 | Disposition: A | Discharge status: Home / Self Care | Payer: Self-pay | Source: Ambulatory Visit | Attending: Obstetrics & Gynecology | Admitting: Obstetrics & Gynecology

## 2011-09-29 DIAGNOSIS — N83209 Unspecified ovarian cyst, unspecified side: Secondary | ICD-10-CM | POA: Insufficient documentation

## 2011-09-29 DIAGNOSIS — N949 Unspecified condition associated with female genital organs and menstrual cycle: Secondary | ICD-10-CM | POA: Insufficient documentation

## 2011-09-30 ENCOUNTER — Emergency Department (HOSPITAL_COMMUNITY): Payer: Self-pay

## 2011-09-30 ENCOUNTER — Emergency Department (HOSPITAL_COMMUNITY)
Admission: EM | Admit: 2011-09-30 | Discharge: 2011-09-30 | Disposition: A | Discharge status: Home / Self Care | Payer: Self-pay | Attending: Emergency Medicine | Admitting: Emergency Medicine

## 2011-09-30 ENCOUNTER — Ambulatory Visit (HOSPITAL_COMMUNITY): Admission: RE | Admit: 2011-09-30 | Payer: PRIVATE HEALTH INSURANCE | Source: Ambulatory Visit

## 2011-09-30 ENCOUNTER — Encounter (HOSPITAL_COMMUNITY): Payer: Self-pay | Admitting: *Deleted

## 2011-09-30 DIAGNOSIS — X500XXA Overexertion from strenuous movement or load, initial encounter: Secondary | ICD-10-CM | POA: Insufficient documentation

## 2011-09-30 DIAGNOSIS — M79673 Pain in unspecified foot: Secondary | ICD-10-CM

## 2011-09-30 DIAGNOSIS — M79609 Pain in unspecified limb: Secondary | ICD-10-CM | POA: Insufficient documentation

## 2011-09-30 DIAGNOSIS — F429 Obsessive-compulsive disorder, unspecified: Secondary | ICD-10-CM | POA: Insufficient documentation

## 2011-09-30 MED ORDER — HYDROCODONE-ACETAMINOPHEN 5-325 MG PO TABS
2.0000 | ORAL_TABLET | Freq: Once | ORAL | Status: AC
  Administered 2011-09-30: 2 via ORAL
  Filled 2011-09-30: qty 2

## 2011-09-30 MED ORDER — OXYCODONE-ACETAMINOPHEN 5-325 MG PO TABS: 2.0000 | ORAL_TABLET | ORAL | Status: AC | PRN

## 2011-09-30 NOTE — ED Notes (Signed)
Patient transported to X-ray 

## 2011-09-30 NOTE — ED Provider Notes (Signed)
History     CSN: [id number]  Arrival date & time 09/30/11  1523   First MD Initiated Contact with Patient 09/30/11 1747      Chief Complaint  Patient presents with  . Foot Pain    (Consider location/radiation/quality/duration/timing/severity/associated sxs/prior treatment) HPI Comments: Patient reports that just prior to arrival she was walking in the street with high heels.  She stepped off of a curb and the heel of her shoe broke.  As a result she twisted her left foot and ankle and has been having pain since that time.  She is able to bear weight, but reports a significant amount of pain with bearing weight.  She feels that her foot is swollen.  No prior injury to that foot.    Patient is a 30 y.o. female presenting with lower extremity pain. The history is provided by the patient.  Foot Pain This is a new problem. The current episode started today. The problem occurs constantly. The problem has been unchanged. Associated symptoms include joint swelling. Pertinent negatives include no abdominal pain, chest pain, diaphoresis, fever, nausea, neck pain, numbness, vomiting or weakness.    Past Medical History  Diagnosis Date  . Gallbladder problem   . Anxiety   . Obsessive compulsive disorder   . Depression   . Back pain   . Chronic pain syndrome     Past Surgical History  Procedure Date  . Cholecystectomy 2009  . Wisdom teeth extraction   . Cesarean section     Family History  Problem Relation Age of Onset  . Diabetes insipidus Father   . Arthritis Father   . Heart failure Father   . COPD Father   . Emphysema Father     History  Substance Use Topics  . Smoking status: Current Everyday Smoker -- 0.5 packs/day for 17 years  . Smokeless tobacco: Not on file  . Alcohol Use: No    OB History    Grav Para Term Preterm Abortions TAB SAB Ect Mult Living   1 1 1       1       Review of Systems  Constitutional: Negative for fever, diaphoresis and appetite change.    HENT: Negative for neck pain and neck stiffness.   Respiratory: Negative for shortness of breath.   Cardiovascular: Negative for chest pain.  Gastrointestinal: Negative for nausea, vomiting and abdominal pain.  Musculoskeletal: Positive for joint swelling. Negative for back pain.  Skin: Negative for wound.  Neurological: Negative for dizziness, syncope, weakness, light-headedness and numbness.    Allergies  Orange oil; Carisoprodol; and Piroxicam  Home Medications   Current Outpatient Rx  Name Route Sig Dispense Refill  . BUSPIRONE HCL 10 MG PO TABS Oral Take 15 mg by mouth 2 (two) times daily.     Marland Kitchen CITALOPRAM HYDROBROMIDE 20 MG PO TABS Oral Take 20 mg by mouth daily.     . CO Q10 100 MG PO TABS Oral Take 500 mg by mouth 2 (two) times daily.      Marland Kitchen DIAZEPAM 5 MG PO TABS Oral Take 5 mg by mouth every 12 (twelve) hours as needed. Back pain    . GABAPENTIN 600 MG PO TABS Oral Take 600-1,200 mg by mouth 3 (three) times daily. 600 mg morning and noon, 1200 mg at night    . HYDROCODONE-ACETAMINOPHEN 5-500 MG PO TABS Oral Take 1-2 tablets by mouth every 4 (four) hours as needed for pain. 30 tablet 0  . LAMOTRIGINE 200  MG PO TABS Oral Take 200 mg by mouth daily.     Carma Leaven M PLUS PO TABS Oral Take 1 tablet by mouth daily.      Marland Kitchen NIACIN 100 MG PO TABS Oral Take 200 mg by mouth 2 (two) times daily.        BP 113/80  Pulse 84  Temp(Src) 97 F (36.1 C) (Oral)  Resp 18  LMP 09/11/2011  Physical Exam  Nursing note and vitals reviewed. Constitutional: She is oriented to person, place, and time. She appears well-developed and well-nourished. No distress.  HENT:  Head: Normocephalic and atraumatic.  Eyes: EOM are normal. Pupils are equal, round, and reactive to light.  Neck: Normal range of motion. Neck supple.  Cardiovascular: Normal rate, regular rhythm and normal heart sounds.   Pulmonary/Chest: Effort normal and breath sounds normal. No respiratory distress.  Musculoskeletal: Normal  range of motion.       Left ankle: She exhibits normal range of motion, no swelling, no ecchymosis, no deformity and normal pulse. tenderness. Lateral malleolus and head of 5th metatarsal tenderness found. Achilles tendon normal.       Patient able to wiggle all of her toes of the left foot.  Sensation intact.  Left foot does not have any obvious bruising, swelling, or deformity.    Neurological: She is alert and oriented to person, place, and time.  Skin: Skin is warm and dry. She is not diaphoretic. No erythema.  Psychiatric: She has a normal mood and affect.    ED Course  Procedures (including critical care time)  Labs Reviewed - No data to display US Transvaginal Non-ob  09/29/2011  *RADIOLOGY REPORT*  Clinical Data: Pelvic pain.  Follow-up complex right ovarian cystic lesion.  TRANSVAGINAL ULTRASOUND OF PELVIS  Technique:  Transvaginal ultrasound examination of the pelvis was performed including evaluation of the uterus, ovaries, adnexal regions, and pelvic cul-de-sac.  Comparison:  08/06/2011  Findings:  Uterus measures 8.4 x 3.9 x 5.9 cm.  No fibroids or other uterine masses identified.  Endometrium measures 8 mm in thickness.  Within normal limits in appearance.  Right Ovary measures 3.6 x 2.8 x 2.7 cm.  Normal appearance.Previously seen complex cyst is no longer visualized, consistent with a resolving hemorrhagic cyst.  Left Ovary measures 3.0 x 1.9 x 2.2 cm.  Normal appearance.  Other Findings:  No other abnormality identified.  IMPRESSION: Resolution of right ovarian hemorrhagic cyst since prior exam.  No evidence of pelvic mass or other significant abnormality.  Original Report Authenticated By: Danae Orleans, M.D.     No diagnosis found.  Above radiology report not from todays visit.  Patient given a cam walker orthopedic boot while in the ED.  She was able to ambulate in the Emergency Department without difficulty while wearing the boot.  Patient given hydrocodone for pain while  in the ED.  Patient's husband is driving her home.  MDM  Negative Xrays.  No obvious deformity.  Neurovascularly intact.  Patient given cam walker orthopedic boot.  Patient has appointment scheduled with PCP on 10-06-11.          Pascal Lux [facility] 09/30/11 1945

## 2011-09-30 NOTE — ED Notes (Signed)
To ed for eval of right foot pain past falling when the heel of her shoe broke. Pulses palpable. Able to wiggle toes.

## 2011-09-30 NOTE — ED Notes (Signed)
CAM WALKER APPLIED BY [facility]

## 2011-09-30 NOTE — ED Notes (Signed)
ORTHO PAGED AND AWARE TO COME APPLY DEVICE

## 2011-09-30 NOTE — Progress Notes (Signed)
Orthopedic Tech Progress Note Patient Details:  [name] 1981-08-04 [id number]  Other Ortho Devices Type of Ortho Device: CAM walker Ortho Device Location: (L) LE Ortho Device Interventions: Application   Jennye Moccasin 09/30/2011, 6:34 PM

## 2011-10-01 NOTE — ED Provider Notes (Signed)
Medical screening examination/treatment/procedure(s) were performed by non-physician practitioner and as supervising physician I was immediately available for consultation/collaboration.  Yates Weisgerber, MD 10/01/11 0833 

## 2011-10-17 ENCOUNTER — Encounter (HOSPITAL_COMMUNITY): Payer: Self-pay | Admitting: Emergency Medicine

## 2011-10-17 ENCOUNTER — Other Ambulatory Visit: Payer: Self-pay

## 2011-10-17 ENCOUNTER — Emergency Department (HOSPITAL_COMMUNITY)
Admission: EM | Admit: 2011-10-17 | Discharge: 2011-10-18 | Disposition: A | Discharge status: Home / Self Care | Payer: PRIVATE HEALTH INSURANCE | Attending: Emergency Medicine | Admitting: Emergency Medicine

## 2011-10-17 DIAGNOSIS — T50904A Poisoning by unspecified drugs, medicaments and biological substances, undetermined, initial encounter: Secondary | ICD-10-CM | POA: Insufficient documentation

## 2011-10-17 DIAGNOSIS — T50901A Poisoning by unspecified drugs, medicaments and biological substances, accidental (unintentional), initial encounter: Secondary | ICD-10-CM | POA: Insufficient documentation

## 2011-10-17 DIAGNOSIS — R5381 Other malaise: Secondary | ICD-10-CM | POA: Insufficient documentation

## 2011-10-17 LAB — COMPREHENSIVE METABOLIC PANEL
AST: 13 U/L (ref 0–37)
BUN: 9 mg/dL (ref 6–23)
CO2: 29 mEq/L (ref 19–32)
Calcium: 9.2 mg/dL (ref 8.4–10.5)
Chloride: 104 mEq/L (ref 96–112)
Creatinine, Ser: 0.96 mg/dL (ref 0.50–1.10)
GFR calc Af Amer: >90 mL/min (ref >90)
GFR calc non Af Amer: 79 mL/min — ABNORMAL LOW (ref >90)
Glucose, Bld: 98 mg/dL (ref 70–99)
Total Bilirubin: 0.1 mg/dL — ABNORMAL LOW (ref 0.3–1.2)

## 2011-10-17 LAB — CBC
HCT: 40.3 % (ref 36.0–46.0)
MCH: 31.4 pg (ref 26.0–34.0)
MCV: 92.4 fL (ref 78.0–100.0)
Platelets: 507 10*3/uL — ABNORMAL HIGH (ref 150–400)
RBC: 4.36 MIL/uL (ref 3.87–5.11)

## 2011-10-17 LAB — ACETAMINOPHEN LEVEL: Acetaminophen (Tylenol), Serum: <15 ug/mL (ref 10–30)

## 2011-10-17 LAB — POCT PREGNANCY, URINE: Preg Test, Ur: NEGATIVE

## 2011-10-17 LAB — SALICYLATE LEVEL: Salicylate Lvl: <2 mg/dL — ABNORMAL LOW (ref 2.8–20.0)

## 2011-10-17 LAB — RAPID URINE DRUG SCREEN, HOSP PERFORMED: Opiates: NOT DETECTED

## 2011-10-17 LAB — ETHANOL: Alcohol, Ethyl (B): 144 mg/dL — ABNORMAL HIGH (ref 0–11)

## 2011-10-17 NOTE — ED Notes (Signed)
Pt states she took pills in order to sleep. She denies SI/HI. Pt states she was on the phone with uncle who called EMS.

## 2011-10-17 NOTE — ED Provider Notes (Signed)
History     CSN: [id number]  Arrival date & time 10/17/11  2111   First MD Initiated Contact with Patient 10/17/11 2116      Chief Complaint  Patient presents with  . Drug Overdose  . Medical Clearance    (Consider location/radiation/quality/duration/timing/severity/associated sxs/prior treatment) HPI..... level V caveat for lethargy. patient allegedly took an overdose of Neurontin, Celexa, BuSpar tonight.  Unknown amounts of each.  She had a disagreement with her husband which led to her actions. This happened before. She has been admitted to a psychiatric facility in the past.  She stated she wanted to go to sleep.  Past Medical History  Diagnosis Date  . Gallbladder problem   . Anxiety   . Obsessive compulsive disorder   . Depression   . Back pain   . Chronic pain syndrome     Past Surgical History  Procedure Date  . Cholecystectomy 2009  . Wisdom teeth extraction   . Cesarean section     Family History  Problem Relation Age of Onset  . Diabetes insipidus Father   . Arthritis Father   . Heart failure Father   . COPD Father   . Emphysema Father     History  Substance Use Topics  . Smoking status: Current Everyday Smoker -- 0.5 packs/day for 17 years  . Smokeless tobacco: Not on file  . Alcohol Use: No    OB History    Grav Para Term Preterm Abortions TAB SAB Ect Mult Living   1 1 1       1       Review of Systems  Unable to perform ROS: Other    Allergies  Orange oil; Carisoprodol; and Piroxicam  Home Medications   Current Outpatient Rx  Name Route Sig Dispense Refill  . BUSPIRONE HCL 10 MG PO TABS Oral Take 15 mg by mouth 2 (two) times daily.     Marland Kitchen CITALOPRAM HYDROBROMIDE 20 MG PO TABS Oral Take 20 mg by mouth daily.     . CO Q10 100 MG PO TABS Oral Take 500 mg by mouth 2 (two) times daily.      Marland Kitchen DIAZEPAM 5 MG PO TABS Oral Take 5 mg by mouth every 12 (twelve) hours as needed. Back pain    . GABAPENTIN 600 MG PO TABS Oral Take 600-1,200 mg by  mouth 3 (three) times daily. 600 mg morning and noon, 1200 mg at night    . LAMOTRIGINE 200 MG PO TABS Oral Take 200 mg by mouth daily.     Carma Leaven M PLUS PO TABS Oral Take 1 tablet by mouth daily.      Marland Kitchen NIACIN 100 MG PO TABS Oral Take 200 mg by mouth 2 (two) times daily.        BP 112/63  Pulse 89  Temp(Src) 98.3 F (36.8 C) (Oral)  SpO2 93%  LMP 09/11/2011  Physical Exam  Nursing note and vitals reviewed. Constitutional: She is oriented to person, place, and time. She appears well-developed and well-nourished.       Patient is groggy but able to answer questions  HENT:  Head: Normocephalic and atraumatic.  Eyes: Conjunctivae and EOM are normal. Pupils are equal, round, and reactive to light.  Neck: Normal range of motion. Neck supple.  Cardiovascular: Normal rate and regular rhythm.   Pulmonary/Chest: Effort normal and breath sounds normal.  Abdominal: Soft. Bowel sounds are normal.  Musculoskeletal: Normal range of motion.  Neurological: She is oriented to  person, place, and time.       Slow speech but understandable  Skin: Skin is warm and dry.  Psychiatric:       Flat affect    ED Course  Procedures (including critical care time)  Labs Reviewed  CBC - Abnormal; Notable for the following:    WBC 10.7 (*)    Platelets 507 (*)    All other components within normal limits  COMPREHENSIVE METABOLIC PANEL - Abnormal; Notable for the following:    Total Bilirubin 0.1 (*)    GFR calc non Af Amer 79 (*)    All other components within normal limits  ETHANOL - Abnormal; Notable for the following:    Alcohol, Ethyl (B) 144 (*)    All other components within normal limits  URINE RAPID DRUG SCREEN (HOSP PERFORMED) - Abnormal; Notable for the following:    Benzodiazepines POSITIVE (*)    All other components within normal limits  SALICYLATE LEVEL - Abnormal; Notable for the following:    Salicylate Lvl <2.0 (*)    All other components within normal limits  ACETAMINOPHEN  LEVEL  POCT PREGNANCY, URINE  POCT PREGNANCY, URINE   No results found.   No diagnosis found.    MDM  I doubt patient took the amount of medications that was alleged.  She is reasonably alert. We'll consult behavioral health for potential admission.        Donnetta Hutching, MD 10/17/11 2337

## 2011-10-17 NOTE — ED Notes (Signed)
Report per EMS: Pt stated she took all of her Neurontin, Celexa, and Buspar. Pt denies SI, stated she wanted to go to sleep.

## 2011-10-17 NOTE — ED Notes (Signed)
MWU:XL24<MW> Expected date:10/17/11<BR> Expected time: 9:04 PM<BR> Means of arrival:Ambulance<BR> Comments:<BR> M241. 31 yo f. OD on rx meds (neurontin, buspar, celexa). Unknown amounts and unknown intentions. ST on monitor. 20+min eta

## 2011-10-18 ENCOUNTER — Encounter (HOSPITAL_COMMUNITY): Payer: Self-pay | Admitting: *Deleted

## 2011-10-18 ENCOUNTER — Inpatient Hospital Stay (HOSPITAL_COMMUNITY)
Admission: AD | Admit: 2011-10-18 | Discharge: 2011-10-19 | DRG: 897 | Disposition: A | Discharge status: Home / Self Care | Payer: No Typology Code available for payment source | Source: Other Acute Inpatient Hospital | Attending: Psychiatry | Admitting: Psychiatry

## 2011-10-18 DIAGNOSIS — T43502A Poisoning by unspecified antipsychotics and neuroleptics, intentional self-harm, initial encounter: Secondary | ICD-10-CM

## 2011-10-18 DIAGNOSIS — E7800 Pure hypercholesterolemia, unspecified: Secondary | ICD-10-CM

## 2011-10-18 DIAGNOSIS — T426X1A Poisoning by other antiepileptic and sedative-hypnotic drugs, accidental (unintentional), initial encounter: Secondary | ICD-10-CM

## 2011-10-18 DIAGNOSIS — M5380 Other specified dorsopathies, site unspecified: Secondary | ICD-10-CM

## 2011-10-18 DIAGNOSIS — Z79899 Other long term (current) drug therapy: Secondary | ICD-10-CM

## 2011-10-18 DIAGNOSIS — F411 Generalized anxiety disorder: Secondary | ICD-10-CM

## 2011-10-18 DIAGNOSIS — F329 Major depressive disorder, single episode, unspecified: Secondary | ICD-10-CM | POA: Diagnosis present

## 2011-10-18 DIAGNOSIS — T50992A Poisoning by other drugs, medicaments and biological substances, intentional self-harm, initial encounter: Secondary | ICD-10-CM

## 2011-10-18 DIAGNOSIS — F1010 Alcohol abuse, uncomplicated: Principal | ICD-10-CM

## 2011-10-18 DIAGNOSIS — G4700 Insomnia, unspecified: Secondary | ICD-10-CM | POA: Diagnosis present

## 2011-10-18 DIAGNOSIS — T424X5A Adverse effect of benzodiazepines, initial encounter: Secondary | ICD-10-CM | POA: Diagnosis present

## 2011-10-18 DIAGNOSIS — E781 Pure hyperglyceridemia: Secondary | ICD-10-CM

## 2011-10-18 DIAGNOSIS — T43294A Poisoning by other antidepressants, undetermined, initial encounter: Secondary | ICD-10-CM

## 2011-10-18 DIAGNOSIS — F3289 Other specified depressive episodes: Secondary | ICD-10-CM | POA: Diagnosis present

## 2011-10-18 DIAGNOSIS — M25519 Pain in unspecified shoulder: Secondary | ICD-10-CM

## 2011-10-18 DIAGNOSIS — M549 Dorsalgia, unspecified: Secondary | ICD-10-CM | POA: Diagnosis present

## 2011-10-18 MED ORDER — ALUM & MAG HYDROXIDE-SIMETH 200-200-20 MG/5ML PO SUSP: 30.0000 mL | ORAL | Status: DC | PRN

## 2011-10-18 MED ORDER — TRAZODONE HCL 50 MG PO TABS
50.0000 mg | ORAL_TABLET | Freq: Every evening | ORAL | Status: DC | PRN
  Administered 2011-10-19 (×2): 50 mg via ORAL
  Filled 2011-10-18 (×2): qty 1

## 2011-10-18 MED ORDER — MAGNESIUM HYDROXIDE 400 MG/5ML PO SUSP: 30.0000 mL | Freq: Every day | ORAL | Status: DC | PRN

## 2011-10-18 MED ORDER — IBUPROFEN 200 MG PO TABS
ORAL_TABLET | ORAL | Status: AC
  Filled 2011-10-18: qty 2

## 2011-10-18 MED ORDER — IBUPROFEN 200 MG PO TABS
400.0000 mg | ORAL_TABLET | Freq: Once | ORAL | Status: AC
  Administered 2011-10-18: 400 mg via ORAL

## 2011-10-18 MED ORDER — NICOTINE 21 MG/24HR TD PT24
21.0000 mg | MEDICATED_PATCH | Freq: Every day | TRANSDERMAL | Status: DC
  Administered 2011-10-19: 21 mg via TRANSDERMAL
  Filled 2011-10-18 (×2): qty 1

## 2011-10-18 MED ORDER — ONDANSETRON HCL 4 MG PO TABS: 4.0000 mg | ORAL_TABLET | Freq: Three times a day (TID) | ORAL | Status: DC | PRN

## 2011-10-18 MED ORDER — ACETAMINOPHEN 325 MG PO TABS: 650.0000 mg | ORAL_TABLET | ORAL | Status: DC | PRN

## 2011-10-18 MED ORDER — ACETAMINOPHEN 325 MG PO TABS
650.0000 mg | ORAL_TABLET | Freq: Four times a day (QID) | ORAL | Status: DC | PRN
  Administered 2011-10-19 (×2): 650 mg via ORAL

## 2011-10-18 NOTE — ED Notes (Signed)
Pt resting in bed quietly at this time. First contact with pt. Pt denies suicidal/homicidal ideation at this time.

## 2011-10-18 NOTE — ED Notes (Signed)
Act team at Bedside

## 2011-10-18 NOTE — ED Notes (Signed)
Valuables:    Rosey Bath, RN and hospital security at bedside to obtain, inventory and secure patient's valuables in hospital safe.

## 2011-10-18 NOTE — ED Provider Notes (Signed)
Patient was evaluated, at her request for left foot pain. She requested Valium for the pain; I previously ordered ,Tylenol and ibuprofen for the pain and she states they did not help. She was seen in emergency room on 09/30/2011 at that time,  X-ray was done of the left foot; it was negative for fracture. She was treated with a cam walker. Patient presented with multiple drug ingestion overdose. Per the past charges history of chronic pain and has had overdoses previously. I do not believe the patient needs benzodiazepines or narcotics for her pain. She has been accepted to behavioral health.  Flint Melter, MD 10/19/11 [phone]

## 2011-10-18 NOTE — BH Assessment (Signed)
Assessment Note   [name] is an 31 y.o. female. Pt was brought to [facility] by EMS after overdosing.  Pt reports she is unsure of exactly how many pills she took but she guessed around 15 neurontin and celexa.  Pt denies this was suicide attempt and states she has been having trouble sleeping for the past month and she wanted to go to sleep after an argument with her husband.  Pt denies depression and denies and significant stressors currently.  Pt denies HI/AV as well.  Pt does have history of inpt treatment at [facility] within the past few years for overdose.  Axis I: Depressive Disorder NOS Axis II: Deferred Axis III:  Past Medical History  Diagnosis Date  . Gallbladder problem   . Anxiety   . Obsessive compulsive disorder   . Depression   . Back pain   . Chronic pain syndrome    Axis IV: problems with primary support group Axis V: 31-40 impairment in reality testing  Past Medical History:  Past Medical History  Diagnosis Date  . Gallbladder problem   . Anxiety   . Obsessive compulsive disorder   . Depression   . Back pain   . Chronic pain syndrome     Past Surgical History  Procedure Date  . Cholecystectomy 2009  . Wisdom teeth extraction   . Cesarean section     Family History:  Family History  Problem Relation Age of Onset  . Diabetes insipidus Father   . Arthritis Father   . Heart failure Father   . COPD Father   . Emphysema Father     Social History:  reports that she has been smoking.  She does not have any smokeless tobacco history on file. She reports that she does not drink alcohol or use illicit drugs.  Additional Social History:  Alcohol / Drug Use Pain Medications: na Prescriptions: na Over the Counter: na History of alcohol / drug use?: Yes Longest period of sobriety (when/how long): na Substance #1 Name of Substance 1: alcohol-beeer 1 - Age of First Use: 10 1 - Amount (size/oz): 2 beers 1 - Frequency: 1x month 1 - Duration:  na 1 - Last Use / Amount: 1/6, 2 beers Allergies:  Allergies  Allergen Reactions  . Orange Oil Shortness Of Breath  . Carisoprodol     Skin peeled   . Piroxicam     Skin peeled    Home Medications:  No current facility-administered medications on file as of 10/17/2011.   Medications Prior to Admission  Medication Sig Dispense Refill  . busPIRone (BUSPAR) 10 MG tablet Take 15 mg by mouth 2 (two) times daily.       . citalopram (CELEXA) 20 MG tablet Take 20 mg by mouth daily.       . Coenzyme Q10 (CO Q10) 100 MG TABS Take 500 mg by mouth 2 (two) times daily.        . diazepam (VALIUM) 5 MG tablet Take 5 mg by mouth every 12 (twelve) hours as needed. Back pain      . gabapentin (NEURONTIN) 600 MG tablet Take 600-1,200 mg by mouth 3 (three) times daily. 600 mg morning and noon, 1200 mg at night      . lamoTRIgine (LAMICTAL) 200 MG tablet Take 200 mg by mouth daily.       . Multiple Vitamins-Minerals (MULTIVITAMINS THER. W/MINERALS) TABS Take 1 tablet by mouth daily.        . niacin  100 MG tablet Take 200 mg by mouth 2 (two) times daily.          OB/GYN Status:  Patient's last menstrual period was 09/11/2011.  General Assessment Data Location of Assessment: [facility] ED ACT Assessment: Yes Living Arrangements: Spouse/significant other;Children Can pt return to current living arrangement?: Yes Transfer from: Home Referral Source: Self/Family/Friend     Risk to self Suicidal Ideation: Yes-Currently Present Suicidal Intent: Yes-Currently Present Is patient at risk for suicide?: Yes Suicidal Plan?: Yes-Currently Present Specify Current Suicidal Plan: overdose Access to Means: Yes Specify Access to Suicidal Means: own medication What has been your use of drugs/alcohol within the last 12 months?: reports alcohol use once per month Previous Attempts/Gestures: Yes How many times?: 1  Other Self Harm Risks: na Triggers for Past Attempts: Other (Comment) (depression) Intentional Self  Injurious Behavior: None Family Suicide History: Yes (both parents made unsuccessful suicide attempts) Recent stressful life event(s): Conflict (Comment) (marital) Persecutory voices/beliefs?: No Depression: No Substance abuse history and/or treatment for substance abuse?: No Suicide prevention information given to non-admitted patients: Not applicable  Risk to Others Homicidal Ideation: No Thoughts of Harm to Others: No Current Homicidal Intent: No Current Homicidal Plan: No Access to Homicidal Means: No Identified Victim: na History of harm to others?: No Assessment of Violence: None Noted Does patient have access to weapons?: No Criminal Charges Pending?: No Does patient have a court date: No  Psychosis Hallucinations: None noted Delusions: None noted  Mental Status Report Appear/Hygiene: Other (Comment) (casual) Eye Contact: Good Motor Activity: Unremarkable Speech: Logical/coherent Level of Consciousness: Alert Mood: Other (Comment) (cooperative) Affect: Appropriate to circumstance Anxiety Level: Minimal Thought Processes: Coherent;Relevant Judgement: Unimpaired Orientation: Person;Place;Time;Situation Obsessive Compulsive Thoughts/Behaviors: None  Cognitive Functioning Concentration: Normal Memory: Recent Intact;Remote Intact IQ: Average Insight: Fair Impulse Control: Poor Appetite: Fair Weight Loss: 0  Weight Gain: 15  Sleep: Decreased Total Hours of Sleep: 5  Vegetative Symptoms: None  Prior Inpatient Therapy Prior Inpatient Therapy: Yes Prior Therapy Dates: 2012 Prior Therapy Facilty/Provider(s): [facility] Reason for Treatment: suicide attempt  Prior Outpatient Therapy Prior Outpatient Therapy: Yes Prior Therapy Dates: 2012 Prior Therapy Facilty/Provider(s): [facility] outpt Reason for Treatment: med management  ADL Screening (condition at time of admission) Patient's cognitive ability adequate to safely complete daily activities?: Yes Independently  performs ADLs?: Yes  Home Assistive Devices/Equipment Home Assistive Devices/Equipment: None    Abuse/Neglect Assessment (Assessment to be complete while patient is alone) Physical Abuse: Denies Verbal Abuse: Denies Sexual Abuse: Denies     Advance Directives (For Healthcare) Advance Directive: Patient does not have advance directive;Patient would not like information    Additional Information 1:1 In Past 12 Months?: No CIRT Risk: No Elopement Risk: Yes Does patient have medical clearance?: Yes     Disposition: ACT made attempt to contact pt's husband but was unsuccessful.  Discussed with Dr Saralyn Pilar who agrees that pt be admitted for psychiatric inpt due to safety concerns related to the overdose.    On Site Evaluation by:   Reviewed with Physician:     Lorri Frederick 10/18/2011 2:53 AM

## 2011-10-18 NOTE — ED Notes (Signed)
Patient transfered from TCU to psych ED 37 .  Patient is tearful stating that she doesn't know why she is here and is asking to see a doctor prior to her transfer   Dr Effie Shy notified of patient request

## 2011-10-18 NOTE — ED Notes (Signed)
Pt with soda cans at bedside. Pt hasn't been wanded by security. Pt in hospital gown pt changed into scrubs. Pt notifed and aware of hospital protacal and process pt verbalize understanding. [employer] notified and aware.

## 2011-10-18 NOTE — ED Notes (Signed)
Security called to wand pt and belongings.  

## 2011-10-18 NOTE — ED Notes (Signed)
Pt offered meal tray pt refused

## 2011-10-18 NOTE — ED Notes (Signed)
Report to [employer] pt to tcu

## 2011-10-19 ENCOUNTER — Encounter (HOSPITAL_COMMUNITY): Payer: Self-pay | Admitting: Psychiatry

## 2011-10-19 DIAGNOSIS — F411 Generalized anxiety disorder: Secondary | ICD-10-CM

## 2011-10-19 DIAGNOSIS — T424X5A Adverse effect of benzodiazepines, initial encounter: Secondary | ICD-10-CM | POA: Diagnosis present

## 2011-10-19 DIAGNOSIS — F1010 Alcohol abuse, uncomplicated: Principal | ICD-10-CM

## 2011-10-19 DIAGNOSIS — T426X1A Poisoning by other antiepileptic and sedative-hypnotic drugs, accidental (unintentional), initial encounter: Secondary | ICD-10-CM

## 2011-10-19 DIAGNOSIS — T43294A Poisoning by other antidepressants, undetermined, initial encounter: Secondary | ICD-10-CM

## 2011-10-19 DIAGNOSIS — F329 Major depressive disorder, single episode, unspecified: Secondary | ICD-10-CM

## 2011-10-19 DIAGNOSIS — T50992A Poisoning by other drugs, medicaments and biological substances, intentional self-harm, initial encounter: Secondary | ICD-10-CM

## 2011-10-19 MED ORDER — DIAZEPAM 5 MG PO TABS: 5.0000 mg | ORAL_TABLET | Freq: Two times a day (BID) | ORAL | Status: DC | PRN

## 2011-10-19 MED ORDER — GABAPENTIN 600 MG PO TABS: 1200.0000 mg | ORAL_TABLET | Freq: Every day | ORAL | Status: DC

## 2011-10-19 MED ORDER — NIACIN 100 MG PO TABS
200.0000 mg | ORAL_TABLET | Freq: Two times a day (BID) | ORAL | Status: DC
  Filled 2011-10-19: qty 2

## 2011-10-19 MED ORDER — THERA M PLUS PO TABS
1.0000 | ORAL_TABLET | Freq: Every day | ORAL | Status: DC
  Administered 2011-10-19: 17:00:00 via ORAL

## 2011-10-19 MED ORDER — BUSPIRONE HCL 10 MG PO TABS
15.0000 mg | ORAL_TABLET | Freq: Two times a day (BID) | ORAL | Status: DC
  Filled 2011-10-19: qty 3

## 2011-10-19 MED ORDER — INFLUENZA VIRUS VACC SPLIT PF IM SUSP: 0.5000 mL | INTRAMUSCULAR | Status: DC

## 2011-10-19 MED ORDER — METHOCARBAMOL 500 MG PO TABS
500.0000 mg | ORAL_TABLET | Freq: Three times a day (TID) | ORAL | Status: DC
  Administered 2011-10-19: 500 mg via ORAL
  Filled 2011-10-19: qty 1

## 2011-10-19 MED ORDER — LAMOTRIGINE 100 MG PO TABS
200.0000 mg | ORAL_TABLET | Freq: Every day | ORAL | Status: DC
  Filled 2011-10-19: qty 2

## 2011-10-19 MED ORDER — CO Q10 100 MG PO TABS: 500.0000 mg | ORAL_TABLET | Freq: Two times a day (BID) | ORAL | Status: DC

## 2011-10-19 MED ORDER — GABAPENTIN 600 MG PO TABS: 600.0000 mg | ORAL_TABLET | Freq: Three times a day (TID) | ORAL | Status: DC

## 2011-10-19 MED ORDER — GABAPENTIN 600 MG PO TABS: 600.0000 mg | ORAL_TABLET | Freq: Every day | ORAL | Status: DC

## 2011-10-19 MED ORDER — CITALOPRAM HYDROBROMIDE 20 MG PO TABS
20.0000 mg | ORAL_TABLET | Freq: Every day | ORAL | Status: DC
  Filled 2011-10-19: qty 1

## 2011-10-19 MED ORDER — TRAZODONE HCL 100 MG PO TABS: 100.0000 mg | ORAL_TABLET | Freq: Every day | ORAL | Status: DC

## 2011-10-19 NOTE — Discharge Summary (Signed)
  Identifying information: 31 year old Caucasian female married this is a voluntary admission.  Date of admission: 10/18/2011 Date of discharge: 10/19/2011.  Discharge diagnoses Axis I: Alcohol intoxication, depressive disorder NOS. Axis II: No diagnosis Axis III: Hypertriglyceridemia, right shoulder pain, and chronic back spasms. Axis IV: Deferred, supportive husband is an asset. Axis V: Current 58 past year not known.  Discharge medications:  Lamotrigine 200 mg daily for mood stability. Niacin 200 mg twice a day for hypertriglyceridemia. Gabapentin 600 mg every morning and q. noon, 1200 mg by mouth each bedtime, for anxiety and chronic right shoulder pain. BuSpar 15 mg twice a day by mouth for anxiety. Celexa 20 mg daily for depression. Diazepam 5 mg by mouth every 12 hours when necessary for back spasms. Coenzyme Q10 500 mg twice a day for hypertriglyceridemia.  There were no changes made in medications while [name] was on our unit. We educated her and counsel her about the need to avoid alcohol given her various neurologic medications. We also advised her to follow up with her primary care physician regarding use of diazepam, and to avoid using alcohol with this medication.  Course of hospitalization:  Fleet Contras was admitted to our mood disorders program after taking an unknown amount of for gabapentin possibly Celexa while she was intoxicated. She presented lethargic in the emergency room with an alcohol level of 144mg % and urine drug screen positive for benzodiazepines.   While in the emergency room,  and here on our unit she convincingly, and consistently denied any suicidal thoughts. She admitted that she doesn't typically drink alcohol and feel this was implicated in her taking too many medications. She felt her judgment was poor but denied dangerous thoughts. She denies any recent stressors in her marriage her social life. Her counselor contacted her husband who is in agreement  that she was safe to come home and we elected to discharge her to outpatient treatment.  Discharge plan: She'll [facility]up at [facility] [place] next [facility]. No medication prescriptions were given at discharge.

## 2011-10-19 NOTE — Tx Team (Signed)
Initial Interdisciplinary Treatment Plan  PATIENT STRENGTHS: (choose at least two) Communication skills General fund of knowledge Physical Health Supportive family/friends  PATIENT STRESSORS: Patient denies any stressors   PROBLEM LIST: Problem List/Patient Goals Date to be addressed Date deferred Reason deferred Estimated date of resolution  Risk for suicide 10/18/11                       Goal - Patient states NONE                               DISCHARGE CRITERIA:  Adequate post-discharge living arrangements Improved stabilization in mood, thinking, and/or behavior Safe-care adequate arrangements made Verbal commitment to aftercare and medication compliance  PRELIMINARY DISCHARGE PLAN: Attend aftercare/continuing care group Return to previous living arrangement Return to previous work or school arrangements  PATIENT/FAMIILY INVOLVEMENT: This treatment plan has been presented to and reviewed with the patient, [name].  The patient and family have been given the opportunity to ask questions and make suggestions.  [name] [name] 10/19/2011, 1:04 AM

## 2011-10-19 NOTE — Progress Notes (Signed)
[facility] Group Notes: (Counselor/Nursing/MHT/Case Management/Adjunct) 10/19/2011   @  11:00am   Type of Therapy:  Group Therapy  Participation Level:  Limited  Participation Quality:  Appropriate  Affect:  Appropriate  Cognitive:  Appropriate  Insight:  Limited  Engagement in Group: Minimal  Engagement in Therapy:  None  Modes of Intervention:  Support and Exploration  Summary of Progress/Problems: Daryn was not very engaged in group. She appeared attentive to what others were saying but did not seem interested in the therapeutic portion of the group. Eletha did not share personally.  Billie Lade 10/14/2011  3:56 PM

## 2011-10-19 NOTE — Progress Notes (Signed)
Pt. Pleasant and cooperative.  Denies SI/HI.  Focused on being discharged  Today.
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
## 2011-10-19 NOTE — Progress Notes (Addendum)
Suicide Risk Assessment  Discharge Assessment     Demographic factors:  Assessment Details Time of Assessment: Admission Information Obtained From: Patient Current Mental Status:  Current Mental Status: Suicidal ideation indicated by patient;Plan includes specific time, place, or method;Self-harm thoughts;Intention to act on suicide plan Risk Reduction Factors:  Risk Reduction Factors: Responsible for children under 32 years of age;Sense of responsibility to family;Living with another person, especially a relative  CLINICAL FACTORS:   Severe Anxiety and/or Agitation Obsessive-Compulsive Disorder Chronic Pain Previous Psychiatric Diagnoses and Treatments Medical Diagnoses and Treatments/Surgeries  COGNITIVE FEATURES THAT CONTRIBUTE TO RISK:  No Cognitive risk factors noted.    SUICIDE RISK:   Minimal: No identifiable suicidal ideation.  Patients presenting with no risk factors but with morbid ruminations; may be classified as minimal risk based on the severity of the depressive symptoms  Suicidal Ideation:   Plan:  No  Intent:  No  Means:  No  Homicidal Ideation:   Plan:  No  Intent:  No  Means:  No  Mental Status: General Appearance /Behavior:  Disheveled Eye Contact:  Good Motor Behavior:  Normal Speech:  Normal Level of Consciousness:  Alert Mood:  1 on a scale of 1 is the least and 10 is the most Affect:  Appropriate Anxiety Level:  1 on a scale of 1 is the least and 10 is the most Thought Process:  Coherent Thought Content:  Obsessions Perception:  Normal Judgment:  Good Insight:  Present Cognition:  Orientation time, place and person Concentration Yes Sleep:  Number of Hours: 3.75   Will increase her trazodone as 50 mg worked after 50 more was added last night.  She tends to forget things on the Valium.  She claims that she has not had any for over 20 days, yet it still showed up in her urine screen.  Will switch her to Robaxin for her back spasms as her  prescriber has moved away and there is no one who can prescribe for her now at the [facility] of the [place].    Vital Signs:Blood pressure 88/65, pulse 76, temperature 97.3 F (36.3 C), temperature source Oral, resp. rate 16, height 5\' 4"  (1.626 m), weight 93.441 kg (206 lb), last menstrual period 09/11/2011. Lab Results: Results for orders placed during the hospital encounter of 10/17/11 (from the past 48 hour(s))  URINE RAPID DRUG SCREEN (HOSP PERFORMED)     Status: Abnormal   Collection Time   10/17/11  9:28 PM      Component Value Range Comment   Opiates NONE DETECTED  NONE DETECTED     Cocaine NONE DETECTED  NONE DETECTED     Benzodiazepines POSITIVE (*) NONE DETECTED     Amphetamines NONE DETECTED  NONE DETECTED     Tetrahydrocannabinol NONE DETECTED  NONE DETECTED     Barbiturates NONE DETECTED  NONE DETECTED    POCT PREGNANCY, URINE     Status: Normal   Collection Time   10/17/11  9:32 PM      Component Value Range Comment   Preg Test, Ur NEGATIVE     CBC     Status: Abnormal   Collection Time   10/17/11 10:10 PM      Component Value Range Comment   WBC 10.7 (*) 4.0 - 10.5 (K/uL)    RBC 4.36  3.87 - 5.11 (MIL/uL)    Hemoglobin 13.7  12.0 - 15.0 (g/dL)    HCT 19.1  47.8 - 29.5 (%)    MCV  92.4  78.0 - 100.0 (fL)    MCH 31.4  26.0 - 34.0 (pg)    MCHC 34.0  30.0 - 36.0 (g/dL)    RDW 45.4  09.8 - 11.9 (%)    Platelets 507 (*) 150 - 400 (K/uL)   COMPREHENSIVE METABOLIC PANEL     Status: Abnormal   Collection Time   10/17/11 10:10 PM      Component Value Range Comment   Sodium 144  135 - 145 (mEq/L)    Potassium 4.2  3.5 - 5.1 (mEq/L)    Chloride 104  96 - 112 (mEq/L)    CO2 29  19 - 32 (mEq/L)    Glucose, Bld 98  70 - 99 (mg/dL)    BUN 9  6 - 23 (mg/dL)    Creatinine, Ser 1.47  0.50 - 1.10 (mg/dL)    Calcium 9.2  8.4 - 10.5 (mg/dL)    Total Protein 6.9  6.0 - 8.3 (g/dL)    Albumin 3.8  3.5 - 5.2 (g/dL)    AST 13  0 - 37 (U/L)    ALT 15  0 - 35 (U/L)    Alkaline  Phosphatase 66  39 - 117 (U/L)    Total Bilirubin 0.1 (*) 0.3 - 1.2 (mg/dL)    GFR calc non Af Amer 79 (*) >90 (mL/min)    GFR calc Af Amer >90  >90 (mL/min)   ETHANOL     Status: Abnormal   Collection Time   10/17/11 10:10 PM      Component Value Range Comment   Alcohol, Ethyl (B) 144 (*) 0 - 11 (mg/dL)   ACETAMINOPHEN LEVEL     Status: Normal   Collection Time   10/17/11 10:10 PM      Component Value Range Comment   Acetaminophen (Tylenol), Serum <15.0  10 - 30 (ug/mL)   SALICYLATE LEVEL     Status: Abnormal   Collection Time   10/17/11 10:10 PM      Component Value Range Comment   Salicylate Lvl <2.0 (*) 2.8 - 20.0 (mg/dL)     Risk of self harm is low in that she has attempted on in the past on her 30th birthday, but has learned that it is not worth an attempt.  She walks away more now and does not argue.  She has learned that regaining self control and keep on moving are good strategies to keep using for her life.  Risk of harm to others is minimal in that she has never been in a physical fight with another person.  She has no legal record of any aggression.  [name] 10/19/2011, 3:28 PM
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
## 2011-10-19 NOTE — Progress Notes (Signed)
[facility] Adult Inpatient Family/Significant Other Suicide Prevention Education  Suicide Prevention Education:  Contact Attempts:  Shea Swalley, husband, has been identified by the patient as the family member/significant other with whom the patient will be residing, and identified as the person(s) who will aid the patient in the event of a mental health crisis.  With written consent from the patient,an attempt was made to provide suicide prevention education, prior to and/or following the patient's discharge.  We were unsuccessful in providing suicide prevention education.     Date and time of first attempt:10/19/11 @ 11:42   Billie Lade 10/19/2011, 11:43 AM

## 2011-10-19 NOTE — Progress Notes (Signed)
Patient ID: [name], female   DOB: November 10, 1980, 30 y.o.   MRN: [id number] Pt denied SI/HI/AVH on discharge.  She was given medications, crisis numbers, and discharge instructions with explanations.  Pt escorted with her ride to the front entrance after her belongings were returned.

## 2011-10-19 NOTE — Progress Notes (Signed)
Patient ID: [name], female   DOB: Nov 05, 1980, 30 y.o.   MRN: [id number] Pt reports sleeping well and says her appetite is good.  She reports normal; energy and says she feels ready to leave.  Says she plans to avoid drinking and that she is ashamed of the incident that lead to her admission and doesn't remember taking pills.  She is c/o constant back pain and L foot pain.

## 2011-10-19 NOTE — Discharge Summary (Signed)
Discharge Note  Patient:  [name] is an 31 y.o., female DOB:  07/10/1981  Date of Admission:  10/18/2011  Date of Discharge:  10/19/2011  Level of Care:  OP  Discharge destination:  HOME  Is patient on multiple antipsychotic therapies at discharge:  NO  Patient phone:  [phone] (home) Patient address:   [address] [place] [address]  The patient received suicide prevention pamphlet:  YES Belongings returned:  Valuables  [name], [name] 10/19/2011,3:52 PM

## 2011-10-19 NOTE — Tx Team (Signed)
Interdisciplinary Treatment Plan Update (Adult)  Date:  10/19/2011  Time Reviewed:  10:03 AM   Progress in Treatment: Attending groups: Yes Participating in groups:  Yes Taking medication as prescribed: Yes Tolerating medication:  Yes Family/Significant othe contact made: Counselor assessing for appropriate collateral contact Patient understands diagnosis:  Yes Discussing patient identified problems/goals with staff:  Yes Medical problems stabilized or resolved:  Yes Denies suicidal/homicidal ideation: Yes Issues/concerns per patient self-inventory:  None identified Other: N/A  New problem(s) identified: None Identified  Reason for Continuation of Hospitalization: Medication stabilization Suicidal ideation Other; describe collateral contact  Interventions implemented related to continuation of hospitalization: mood stabilization, medication monitoring and adjustment, group therapy and psycho education, safety checks q 15 mins  Additional comments: N/A  Estimated length of stay: 1-2 days  Discharge Plan: Pt will follow up at [facility] of the [place] for medication management and therapy.    New goal(s): N/A  Review of initial/current patient goals per problem list:    1.  Goal(s): Address suicide attempt  Met:  No  Target date: by discharge  As evidenced by: attending groups regularly and make collateral contact with pt's husband   2.  Goal(s): Address depressive symptoms  Met:  Yes  Target date: by discharge  As evidenced by: Pt denies depression, anxiety and SI today   Attendees: Patient:  [name] 10/19/2011 10:14 AM   Family:     Physician:  Orson Aloe, MD  10/19/2011  10:03 AM   Nursing:   Neill Loft, RN 10/19/2011 10:09 AM   Case Manager:  Reyes Ivan, LCSWA 10/19/2011  10:03 AM   Counselor:  Angus Palms, LCSW 10/19/2011  10:03 AM   Other:  Juline Patch, LCSW 10/19/2011  10:03 AM   Other:     Other:     Other:      Scribe for  Treatment Team:   Carmina Miller, 10/19/2011 , 10:03 AM

## 2011-10-19 NOTE — Progress Notes (Signed)
Grief and Loss Group  Group addressed losses related to relationships and self. Discussion focused on nature of losses, feelings experienced and how they have reacted to their grief. In the end of the group we processed how it is to come to terms with what cannot be changed and what it means to be open to the options and possibilities which exist now.  Pt. Was attentive in group but did not share personally.    Kathlyn [place], M.Div, [facility] Director Chaplaincy Service

## 2011-10-19 NOTE — Progress Notes (Signed)
[facility] Group Notes: (Counselor/Nursing/MHT/Case Management/Adjunct) 10/19/2011   @1 :15pm  Type of Therapy:  Group Therapy  Participation Level:  Did Not Attend   [name] 10/14/2011  3:56 PM

## 2011-10-19 NOTE — Progress Notes (Signed)
[facility] Adult Inpatient Family/Significant Other Suicide Prevention Education  Suicide Prevention Education:  Education Completed;David [place], husband,  has been identified by the patient as the family member/significant other with whom the patient will be residing, and identified as the person(s) who will aid the patient in the event of a mental health crisis (suicidal ideations/suicide attempt).  With written consent from the patient, the family member/significant other has been provided the following suicide prevention education, prior to the and/or following the discharge of the patient.  The suicide prevention education provided includes the following:  Suicide risk factors  Suicide prevention and interventions  National Suicide Hotline telephone number  [facility] assessment telephone number  [facility] Emergency Assistance 911  [facility] and/or Residential Mobile Crisis Unit telephone number  Request made of family/significant other to:  Remove weapons (e.g., guns, rifles, knives), all items previously/currently identified as safety concern.    Remove drugs/medications (over-the-counter, prescriptions, illicit drugs), all items previously/currently identified as a safety concern.  [name] reported that he does not believe [name] attempted suicide, and that she was simply trying to sleep as she has been unable to do so lately due to stressing over bills and unwanted weight gain. He denied that he has any safety concerns regarding her being discharged; he indicated that he would like to have her home as soon as possible. [name] reported that there are no firearms in the home, and that he will monitor and administer [name] medication. He verbalized understanding of suicide prevention information and had no further questions.    [name] 10/19/2011, 11:56am

## 2011-10-19 NOTE — Progress Notes (Signed)
Adult Psychosocial Assessment Update Interdisciplinary Team  [facility] admissions/discharges:  Admissions Discharges  Date: 12/03/2010 Date:  12/04/2010  Date: Date:  Date: Date:  Date: Date:  Date: Date:   Changes since the last Psychosocial Assessment (including adherence to outpatient mental health and/or substance abuse treatment, situational issues contributing to decompensation and/or relapse). Joei reports she was watching football with husband, remembers seeing her bottles  Of medication in the cabinet and then the next thing she remembers is EMS showing up.  She does not remember taking the overdose, how much or what she took. Rhya states  She had been drinking and does not know how much. Denies any depression or suicidal  Thoughts, but feels shame over what she did. She indicates that she was not arguing   With her hu20sband, simply bickering over football.   Discharge Plan 1. Will you be returning to the same living situation after discharge?   Yes:     X No:      If no, what is your plan?    Return home with family (son and husband)        2. Would you like a referral for services when you are discharged? Yes:     If yes, for what services?  No:   X    Sees therapist at [facility] of the [place] and psychiatrist there as well.        Summary and Recommendations (to be completed by the evaluator) Aiyanah is a 31 year old married female diagnosed with Depressive Disorder NOS. She   Denies any suicidal intent, and reports that she does not know why the overdose   Happened. Only remembers that she lay down on her bed to sleep, and then the EMS  Workers showed up. Yaira would benefit from crisis stabilization, medication   Evaluation, therapy groups for processing thoughts/feelings/ experiences, psychoed   Groups for coping skills and case management for discharge planning.              Signature:  Billie Lade,  10/19/2011 9:09 AM

## 2011-10-21 NOTE — Progress Notes (Signed)
Patient Discharge Instructions:  Admission Note Faxed,  10/20/2011 After Visit Summary Faxed,  10/20/2011 Faxed to the Next Level Care provider:  10/20/2011 D/C Summary faxed 10/20/2011 Facesheet faxed 10/20/2011  Faxed to [facility] @ [phone]  Wandra Scot, 10/21/2011, 11:15 AM

## 2012-09-01 ENCOUNTER — Encounter (HOSPITAL_BASED_OUTPATIENT_CLINIC_OR_DEPARTMENT_OTHER): Payer: Self-pay | Admitting: *Deleted

## 2012-09-01 ENCOUNTER — Emergency Department (HOSPITAL_BASED_OUTPATIENT_CLINIC_OR_DEPARTMENT_OTHER)
Admission: EM | Admit: 2012-09-01 | Discharge: 2012-09-01 | Disposition: A | Discharge status: Home / Self Care | Payer: Self-pay | Attending: Emergency Medicine | Admitting: Emergency Medicine

## 2012-09-01 DIAGNOSIS — F329 Major depressive disorder, single episode, unspecified: Secondary | ICD-10-CM | POA: Insufficient documentation

## 2012-09-01 DIAGNOSIS — F3289 Other specified depressive episodes: Secondary | ICD-10-CM | POA: Insufficient documentation

## 2012-09-01 DIAGNOSIS — Z8719 Personal history of other diseases of the digestive system: Secondary | ICD-10-CM | POA: Insufficient documentation

## 2012-09-01 DIAGNOSIS — J020 Streptococcal pharyngitis: Secondary | ICD-10-CM | POA: Insufficient documentation

## 2012-09-01 DIAGNOSIS — F429 Obsessive-compulsive disorder, unspecified: Secondary | ICD-10-CM | POA: Insufficient documentation

## 2012-09-01 DIAGNOSIS — F17200 Nicotine dependence, unspecified, uncomplicated: Secondary | ICD-10-CM | POA: Insufficient documentation

## 2012-09-01 DIAGNOSIS — F411 Generalized anxiety disorder: Secondary | ICD-10-CM | POA: Insufficient documentation

## 2012-09-01 DIAGNOSIS — G894 Chronic pain syndrome: Secondary | ICD-10-CM | POA: Insufficient documentation

## 2012-09-01 DIAGNOSIS — Z79899 Other long term (current) drug therapy: Secondary | ICD-10-CM | POA: Insufficient documentation

## 2012-09-01 MED ORDER — PENICILLIN G BENZATHINE 1200000 UNIT/2ML IM SUSP
1.2000 10*6.[IU] | Freq: Once | INTRAMUSCULAR | Status: AC
  Administered 2012-09-01: 1.2 10*6.[IU] via INTRAMUSCULAR
  Filled 2012-09-01: qty 2

## 2012-09-01 MED ORDER — OXYCODONE-ACETAMINOPHEN 5-325 MG PO TABS
2.0000 | ORAL_TABLET | Freq: Once | ORAL | Status: AC
  Administered 2012-09-01: 2 via ORAL
  Filled 2012-09-01 (×2): qty 2

## 2012-09-01 MED ORDER — DEXAMETHASONE 4 MG PO TABS
10.0000 mg | ORAL_TABLET | Freq: Once | ORAL | Status: AC
  Administered 2012-09-01: 10 mg via ORAL
  Filled 2012-09-01: qty 3

## 2012-09-01 NOTE — ED Provider Notes (Signed)
History    31 year old female with sore throat. Patient first noticed when she woke up this morning and has progressively worsened throughout the day. Pain is worse with swallowing. No difficulty breathing. No wheezing. No nausea or vomiting. Subjective fever. Denies any pain anywhere else. No cough. No rash. No abdominal pain. Patient is a smoker.   CSN: [id number]  Arrival date & time 09/01/12  1800   First MD Initiated Contact with Patient 09/01/12 1811      Chief Complaint  Patient presents with  . Sore Throat    (Consider location/radiation/quality/duration/timing/severity/associated sxs/prior treatment) HPI  Past Medical History  Diagnosis Date  . Gallbladder problem   . Anxiety   . Obsessive compulsive disorder   . Depression   . Back pain   . Chronic pain syndrome     Past Surgical History  Procedure Date  . Cholecystectomy 2009  . Wisdom teeth extraction   . Cesarean section     Family History  Problem Relation Age of Onset  . Diabetes insipidus Father   . Arthritis Father   . Heart failure Father   . COPD Father   . Emphysema Father     History  Substance Use Topics  . Smoking status: Current Every Day Smoker -- 0.5 packs/day for 17 years    Types: Cigarettes  . Smokeless tobacco: Never Used  . Alcohol Use: No     Comment: unsure amount (1/6) - states generally does not drink    OB History    Grav Para Term Preterm Abortions TAB SAB Ect Mult Living   1 1 1       1       Review of Systems   Review of symptoms negative unless otherwise noted in HPI.   Allergies  Orange oil; Carisoprodol; and Piroxicam  Home Medications   Current Outpatient Rx  Name  Route  Sig  Dispense  Refill  . BUSPIRONE HCL 10 MG PO TABS   Oral   Take 15 mg by mouth 2 (two) times daily.          Marland Kitchen CITALOPRAM HYDROBROMIDE 20 MG PO TABS   Oral   Take 20 mg by mouth daily.          . CO Q10 100 MG PO TABS   Oral   Take 500 mg by mouth 2 (two) times daily.            Marland Kitchen DIAZEPAM 5 MG PO TABS   Oral   Take 5 mg by mouth every 12 (twelve) hours as needed. Back pain         . GABAPENTIN 600 MG PO TABS   Oral   Take 600-1,200 mg by mouth 3 (three) times daily. 600 mg morning and noon, 1200 mg at night         . LAMOTRIGINE 200 MG PO TABS   Oral   Take 200 mg by mouth daily.          Carma Leaven M PLUS PO TABS   Oral   Take 1 tablet by mouth daily.           Marland Kitchen NIACIN 100 MG PO TABS   Oral   Take 200 mg by mouth 2 (two) times daily.             BP 127/82  Pulse 109  Temp 98.2 F (36.8 C) (Oral)  Resp 16  Ht 5\' 7"  (1.702 m)  Wt 202 lb (91.627 kg)  BMI 31.64 kg/m2  SpO2 100%  LMP 08/28/2012  Physical Exam  Nursing note and vitals reviewed. Constitutional: She appears well-developed and well-nourished. No distress.  HENT:  Head: Normocephalic and atraumatic.       Bilateral tonsillar exudate and posterior pharyngeal erythema. Uvula is midline. Handling secretions. Phonation sounds normal. Neck is supple. Tender cervical adenopathy. No stridor.  Eyes: Conjunctivae normal are normal. Right eye exhibits no discharge. Left eye exhibits no discharge.  Neck: Neck supple.  Cardiovascular: Regular rhythm and normal heart sounds.  Exam reveals no gallop and no friction rub.   No murmur heard.      Mild tachycardia.  Pulmonary/Chest: Effort normal and breath sounds normal. No respiratory distress.  Abdominal: Soft. She exhibits no distension. There is no tenderness.  Musculoskeletal: She exhibits no edema and no tenderness.  Neurological: She is alert.  Skin: Skin is warm and dry.  Psychiatric: She has a normal mood and affect. Her behavior is normal. Thought content normal.    ED Course  Procedures (including critical care time)  Labs Reviewed - No data to display No results found.   1. Strep pharyngitis       MDM  31 year old female with sore throat. Three out of four central criteria with tonsillar exudate,  tender cervical lymphadenopathy and absence of cough. Patient with no evidence of airway compromise or significant deep space neck infection. Patient agreeable to Bicillin injection. Percocet and Decadron for some symptomatic relief. Continue to pain control with NSAIDs. Return precautions discussed.        Raeford Razor, MD 09/01/12 Paulo Fruit

## 2012-09-01 NOTE — ED Notes (Signed)
MD at bedside. 

## 2012-09-01 NOTE — ED Notes (Signed)
Pt. To wait due to PCN injection.

## 2012-09-01 NOTE — ED Notes (Signed)
No drooling noted and Pt. Is able to speak.  Reports she has a severe sore throat.  History of strep tht per Pt.

## 2012-09-09 ENCOUNTER — Emergency Department (HOSPITAL_BASED_OUTPATIENT_CLINIC_OR_DEPARTMENT_OTHER): Payer: Self-pay

## 2012-09-09 ENCOUNTER — Encounter (HOSPITAL_BASED_OUTPATIENT_CLINIC_OR_DEPARTMENT_OTHER): Payer: Self-pay | Admitting: Emergency Medicine

## 2012-09-09 ENCOUNTER — Emergency Department (HOSPITAL_BASED_OUTPATIENT_CLINIC_OR_DEPARTMENT_OTHER)
Admission: EM | Admit: 2012-09-09 | Discharge: 2012-09-09 | Disposition: A | Discharge status: Home / Self Care | Payer: Self-pay | Attending: Emergency Medicine | Admitting: Emergency Medicine

## 2012-09-09 DIAGNOSIS — Z8739 Personal history of other diseases of the musculoskeletal system and connective tissue: Secondary | ICD-10-CM | POA: Insufficient documentation

## 2012-09-09 DIAGNOSIS — J029 Acute pharyngitis, unspecified: Secondary | ICD-10-CM | POA: Insufficient documentation

## 2012-09-09 DIAGNOSIS — R0789 Other chest pain: Secondary | ICD-10-CM | POA: Insufficient documentation

## 2012-09-09 DIAGNOSIS — Z8659 Personal history of other mental and behavioral disorders: Secondary | ICD-10-CM | POA: Insufficient documentation

## 2012-09-09 DIAGNOSIS — IMO0002 Reserved for concepts with insufficient information to code with codable children: Secondary | ICD-10-CM | POA: Insufficient documentation

## 2012-09-09 DIAGNOSIS — R1310 Dysphagia, unspecified: Secondary | ICD-10-CM | POA: Insufficient documentation

## 2012-09-09 DIAGNOSIS — Z79899 Other long term (current) drug therapy: Secondary | ICD-10-CM | POA: Insufficient documentation

## 2012-09-09 DIAGNOSIS — R059 Cough, unspecified: Secondary | ICD-10-CM | POA: Insufficient documentation

## 2012-09-09 DIAGNOSIS — R5381 Other malaise: Secondary | ICD-10-CM | POA: Insufficient documentation

## 2012-09-09 DIAGNOSIS — R062 Wheezing: Secondary | ICD-10-CM | POA: Insufficient documentation

## 2012-09-09 DIAGNOSIS — R6883 Chills (without fever): Secondary | ICD-10-CM | POA: Insufficient documentation

## 2012-09-09 DIAGNOSIS — G894 Chronic pain syndrome: Secondary | ICD-10-CM | POA: Insufficient documentation

## 2012-09-09 DIAGNOSIS — Z8719 Personal history of other diseases of the digestive system: Secondary | ICD-10-CM | POA: Insufficient documentation

## 2012-09-09 DIAGNOSIS — J40 Bronchitis, not specified as acute or chronic: Secondary | ICD-10-CM

## 2012-09-09 DIAGNOSIS — R05 Cough: Secondary | ICD-10-CM | POA: Insufficient documentation

## 2012-09-09 DIAGNOSIS — F17200 Nicotine dependence, unspecified, uncomplicated: Secondary | ICD-10-CM | POA: Insufficient documentation

## 2012-09-09 LAB — URINALYSIS, ROUTINE W REFLEX MICROSCOPIC
Bilirubin Urine: NEGATIVE
Glucose, UA: NEGATIVE mg/dL
Hgb urine dipstick: NEGATIVE
Ketones, ur: NEGATIVE mg/dL
Leukocytes, UA: NEGATIVE
Nitrite: NEGATIVE
Protein, ur: NEGATIVE mg/dL
Specific Gravity, Urine: 1.012 (ref 1.005–1.030)
Urobilinogen, UA: 0.2 mg/dL (ref 0.0–1.0)
pH: 7.5 (ref 5.0–8.0)

## 2012-09-09 LAB — PREGNANCY, URINE: Preg Test, Ur: NEGATIVE

## 2012-09-09 MED ORDER — ALBUTEROL SULFATE (5 MG/ML) 0.5% IN NEBU
5.0000 mg | INHALATION_SOLUTION | Freq: Once | RESPIRATORY_TRACT | Status: AC
  Administered 2012-09-09: 5 mg via RESPIRATORY_TRACT
  Filled 2012-09-09: qty 1

## 2012-09-09 MED ORDER — HYDROCOD POLST-CHLORPHEN POLST 10-8 MG/5ML PO LQCR: 5.0000 mL | Freq: Two times a day (BID) | ORAL | Status: DC | PRN

## 2012-09-09 MED ORDER — ALBUTEROL SULFATE HFA 108 (90 BASE) MCG/ACT IN AERS
1.0000 | INHALATION_SPRAY | Freq: Four times a day (QID) | RESPIRATORY_TRACT | Status: DC | PRN
  Filled 2012-09-09: qty 6.7

## 2012-09-09 MED ORDER — PREDNISONE 20 MG PO TABS
40.0000 mg | ORAL_TABLET | Freq: Once | ORAL | Status: AC
  Administered 2012-09-09: 40 mg via ORAL
  Filled 2012-09-09: qty 2

## 2012-09-09 MED ORDER — HYDROCOD POLST-CHLORPHEN POLST 10-8 MG/5ML PO LQCR
5.0000 mL | Freq: Once | ORAL | Status: AC
  Administered 2012-09-09: 5 mL via ORAL
  Filled 2012-09-09: qty 5

## 2012-09-09 MED ORDER — PREDNISONE 20 MG PO TABS: 40.0000 mg | ORAL_TABLET | Freq: Every day | ORAL | Status: DC

## 2012-09-09 MED ORDER — IPRATROPIUM BROMIDE 0.02 % IN SOLN
0.5000 mg | Freq: Once | RESPIRATORY_TRACT | Status: AC
  Administered 2012-09-09: 0.5 mg via RESPIRATORY_TRACT
  Filled 2012-09-09: qty 2.5

## 2012-09-09 NOTE — ED Notes (Signed)
Onset x 4 days  Upper chest tightness/discomfort.  Sounds hoarse/wheeze.  States has had a sl cough,sometimes productive(brown).  Alert and oriented,color good  Skin warm and dry.  Denies radiation of pain,nausea,vomiting or diaphoresis

## 2012-09-09 NOTE — ED Notes (Signed)
RT Note: Patient states that she currently smokes 1/2 pack of cigarettes a day. Patient does not appear to be in any acute respiratory distress.

## 2012-09-09 NOTE — Discharge Instructions (Signed)
 Bronchitis Bronchitis is the body's way of reacting to injury and/or infection (inflammation) of the bronchi. Bronchi are the air tubes that extend from the windpipe into the lungs. If the inflammation becomes severe, it may cause shortness of breath. CAUSES  Inflammation may be caused by:  A virus.  Germs (bacteria).  Dust.  Allergens.  Pollutants and many other irritants. The cells lining the bronchial tree are covered with tiny hairs (cilia). These constantly beat upward, away from the lungs, toward the mouth. This keeps the lungs free of pollutants. When these cells become too irritated and are unable to do their job, mucus begins to develop. This causes the characteristic cough of bronchitis. The cough clears the lungs when the cilia are unable to do their job. Without either of these protective mechanisms, the mucus would settle in the lungs. Then you would develop pneumonia. Smoking is a common cause of bronchitis and can contribute to pneumonia. Stopping this habit is the single most important thing you can do to help yourself. TREATMENT   Your caregiver may prescribe an antibiotic if the cough is caused by bacteria. Also, medicines that open up your airways make it easier to breathe. Your caregiver may also recommend or prescribe an expectorant. It will loosen the mucus to be coughed up. Only take over-the-counter or prescription medicines for pain, discomfort, or fever as directed by your caregiver.  Removing whatever causes the problem (smoking, for example) is critical to preventing the problem from getting worse.  Cough suppressants may be prescribed for relief of cough symptoms.  Inhaled medicines may be prescribed to help with symptoms now and to help prevent problems from returning.  For those with recurrent (chronic) bronchitis, there may be a need for steroid medicines. SEEK IMMEDIATE MEDICAL CARE IF:   During treatment, you develop more pus-like mucus (purulent  sputum).  You have a fever.  Your baby is older than 3 months with a rectal temperature of 102 F (38.9 C) or higher.  Your baby is 18 months old or younger with a rectal temperature of 100.4 F (38 C) or higher.  You become progressively more ill.  You have increased difficulty breathing, wheezing, or shortness of breath. It is necessary to seek immediate medical care if you are elderly or sick from any other disease. MAKE SURE YOU:   Understand these instructions.  Will watch your condition.  Will get help right away if you are not doing well or get worse. Document Released: 09/27/2005 Document Revised: 12/20/2011 Document Reviewed: 08/06/2008 [facility] Patient Information 2013 [place], [place].   Continue to use inhaler 2 puffs every 6 hours for 2 days.  Then may use 1-2 puffs every 6 hours as needed for coughing, wheezing, shortness of breath.  Tussionex contains hydrocodone  which is a narcotic so use with caution as it can cause drowsiness or dependence.

## 2012-09-09 NOTE — ED Provider Notes (Signed)
History  This chart was scribed for Gavin Pound. Oletta Lamas, MD by Ardeen Jourdain, ED Scribe. This patient was seen in room MH03/MH03 and the patient's care was started at 1458.  CSN: [id number]  Arrival date & time 09/09/12  1447   First MD Initiated Contact with Patient 09/09/12 1458      Chief Complaint  Patient presents with  . Shortness of Breath     The history is provided by the patient. No language interpreter was used.    [name] is a 31 y.o. female who presents to the Emergency Department complaining of gradually worsening productive cough with associated SOB and chills. She states her sputum is brown or bloody in color. She presented to the ED c/o a sore throat 1 week ago when she was diagnosed with strep throat. She reports receiving a shot of penicillin, pain killers and steroids at that time. She states the medications helped at first but 3 days later her current symptoms appeared. She denies getting a flu shot. She admits to sick contact via her son. She has a h/o depression, back pain and OCD. She states her depression is under control. Pt is a current everyday smoker and occasional alcohol user.   Past Medical History  Diagnosis Date  . Gallbladder problem   . Anxiety   . Obsessive compulsive disorder   . Depression   . Back pain   . Chronic pain syndrome     Past Surgical History  Procedure Date  . Cholecystectomy 2009  . Wisdom teeth extraction   . Cesarean section     Family History  Problem Relation Age of Onset  . Diabetes insipidus Father   . Arthritis Father   . Heart failure Father   . COPD Father   . Emphysema Father   . Asthma Son     History  Substance Use Topics  . Smoking status: Current Every Day Smoker -- 0.5 packs/day for 17 years    Types: Cigarettes  . Smokeless tobacco: Never Used  . Alcohol Use: No     Comment: unsure amount (1/6) - states generally does not drink    OB History    Grav Para Term Preterm Abortions  TAB SAB Ect Mult Living   1 1 1       1       Review of Systems  Constitutional: Positive for chills, activity change and fatigue. Negative for fever and appetite change.  HENT: Positive for sore throat and trouble swallowing. Negative for congestion.   Respiratory: Positive for cough, chest tightness and shortness of breath.   Gastrointestinal: Negative for nausea, vomiting and diarrhea.    Allergies  Orange oil; Carisoprodol; and Piroxicam  Home Medications   Current Outpatient Rx  Name  Route  Sig  Dispense  Refill  . BUSPIRONE HCL 10 MG PO TABS   Oral   Take 15 mg by mouth 2 (two) times daily.          Marland Kitchen HYDROCOD POLST-CPM POLST ER 10-8 MG/5ML PO LQCR   Oral   Take 5 mLs by mouth every 12 (twelve) hours as needed.   80 mL   0   . CITALOPRAM HYDROBROMIDE 20 MG PO TABS   Oral   Take 20 mg by mouth daily.          . CO Q10 100 MG PO TABS   Oral   Take 500 mg by mouth 2 (two) times daily.           Marland Kitchen  DIAZEPAM 5 MG PO TABS   Oral   Take 5 mg by mouth every 12 (twelve) hours as needed. Back pain         . GABAPENTIN 600 MG PO TABS   Oral   Take 600-1,200 mg by mouth 3 (three) times daily. 600 mg morning and noon, 1200 mg at night         . LAMOTRIGINE 200 MG PO TABS   Oral   Take 200 mg by mouth daily.          Carma Leaven M PLUS PO TABS   Oral   Take 1 tablet by mouth daily.           Marland Kitchen NIACIN 100 MG PO TABS   Oral   Take 200 mg by mouth 2 (two) times daily.           Marland Kitchen PREDNISONE 20 MG PO TABS   Oral   Take 2 tablets (40 mg total) by mouth daily.   12 tablet   0     Triage Vitals: BP 133/80  Pulse 83  Temp 97.4 F (36.3 C) (Oral)  Resp 22  Ht 5\' 6"  (1.676 m)  Wt 202 lb (91.627 kg)  BMI 32.60 kg/m2  SpO2 100%  LMP 08/28/2012  Physical Exam  Nursing note and vitals reviewed. Constitutional: She is oriented to person, place, and time. She appears well-developed and well-nourished. No distress.  HENT:  Head: Normocephalic and  atraumatic.  Eyes: EOM are normal. Pupils are equal, round, and reactive to light.  Neck: Normal range of motion. Neck supple. No tracheal deviation present.  Cardiovascular: Normal rate, regular rhythm and normal heart sounds.   Pulmonary/Chest: Effort normal. No respiratory distress. She has wheezes.       Slight expiatory wheezes noticed initially, cleared by end of exam  Abdominal: Soft. She exhibits no distension.  Musculoskeletal: Normal range of motion. She exhibits no edema.  Neurological: She is alert and oriented to person, place, and time.  Skin: Skin is warm and dry.  Psychiatric: She has a normal mood and affect. Her behavior is normal.    ED Course  Procedures (including critical care time) DIAGNOSTIC STUDIES: Oxygen Saturation is 100% on room air, normal by my interpretation.    COORDINATION OF CARE:  3:14 PM: Discussed treatment plan which includes a breathing treatment with pt at bedside and pt agreed to plan.     Labs Reviewed  URINALYSIS, ROUTINE W REFLEX MICROSCOPIC  PREGNANCY, URINE   Dg Chest 2 View  09/09/2012  *RADIOLOGY REPORT*  Clinical Data: Short of breath and chest tightness  CHEST - 2 VIEW  Comparison: 07/09/2011  Findings: Heart size upper normal.  Normal vascularity.  Lungs are clear without infiltrate or effusion  IMPRESSION: No active cardiopulmonary disease.   Original Report Authenticated By: Janeece Riggers, M.D.      1. Bronchitis     sats remain 100%.  I reviewed 2 view CXR and no infiltrates, normal heart border, ribs normal, no free air.  NML CXR.    Pt doesn't feel much improved, requests pain meds.  Pt with chronic pain history, doesn't have any due to insurance lost.  No recent travel, not tachycardic, no calf swelling, B neg Homan's.  No risk for PE.  I think with severe coughing, pt still has bronchitis and will feel improved with continued treatments and once steroids take effect.  Will give tussionex for pain and cough relief.  MDM  I personally performed the services described in this documentation, which was scribed in my presence. The recorded information has been reviewed and is accurate.        Gavin Pound. Mariely Mahr, MD 09/09/12 1644

## 2013-06-08 ENCOUNTER — Emergency Department (HOSPITAL_COMMUNITY)
Admission: EM | Admit: 2013-06-08 | Discharge: 2013-06-09 | Disposition: A | Discharge status: Other Acute Inpatient Hospital | Payer: Self-pay | Attending: Emergency Medicine | Admitting: Emergency Medicine

## 2013-06-08 ENCOUNTER — Encounter (HOSPITAL_COMMUNITY): Payer: Self-pay | Admitting: Emergency Medicine

## 2013-06-08 ENCOUNTER — Encounter (HOSPITAL_COMMUNITY): Payer: Self-pay | Admitting: *Deleted

## 2013-06-08 DIAGNOSIS — G894 Chronic pain syndrome: Secondary | ICD-10-CM | POA: Insufficient documentation

## 2013-06-08 DIAGNOSIS — R4589 Other symptoms and signs involving emotional state: Secondary | ICD-10-CM | POA: Diagnosis present

## 2013-06-08 DIAGNOSIS — F411 Generalized anxiety disorder: Secondary | ICD-10-CM | POA: Insufficient documentation

## 2013-06-08 DIAGNOSIS — F32A Depression, unspecified: Secondary | ICD-10-CM

## 2013-06-08 DIAGNOSIS — F429 Obsessive-compulsive disorder, unspecified: Secondary | ICD-10-CM | POA: Insufficient documentation

## 2013-06-08 DIAGNOSIS — Z0289 Encounter for other administrative examinations: Secondary | ICD-10-CM | POA: Insufficient documentation

## 2013-06-08 DIAGNOSIS — M549 Dorsalgia, unspecified: Secondary | ICD-10-CM | POA: Insufficient documentation

## 2013-06-08 DIAGNOSIS — R000 Tachycardia, unspecified: Secondary | ICD-10-CM | POA: Insufficient documentation

## 2013-06-08 DIAGNOSIS — F17200 Nicotine dependence, unspecified, uncomplicated: Secondary | ICD-10-CM | POA: Insufficient documentation

## 2013-06-08 DIAGNOSIS — F419 Anxiety disorder, unspecified: Secondary | ICD-10-CM

## 2013-06-08 DIAGNOSIS — Z3202 Encounter for pregnancy test, result negative: Secondary | ICD-10-CM | POA: Insufficient documentation

## 2013-06-08 DIAGNOSIS — F329 Major depressive disorder, single episode, unspecified: Secondary | ICD-10-CM

## 2013-06-08 DIAGNOSIS — R0789 Other chest pain: Secondary | ICD-10-CM | POA: Insufficient documentation

## 2013-06-08 DIAGNOSIS — Z79899 Other long term (current) drug therapy: Secondary | ICD-10-CM | POA: Insufficient documentation

## 2013-06-08 DIAGNOSIS — F3289 Other specified depressive episodes: Secondary | ICD-10-CM | POA: Insufficient documentation

## 2013-06-08 DIAGNOSIS — F1010 Alcohol abuse, uncomplicated: Secondary | ICD-10-CM | POA: Clinically undetermined

## 2013-06-08 LAB — COMPREHENSIVE METABOLIC PANEL
CO2: 23 mEq/L (ref 19–32)
Calcium: 10 mg/dL (ref 8.4–10.5)
Creatinine, Ser: 0.68 mg/dL (ref 0.50–1.10)
GFR calc Af Amer: >90 mL/min (ref >90)
GFR calc non Af Amer: >90 mL/min (ref >90)
Glucose, Bld: 127 mg/dL — ABNORMAL HIGH (ref 70–99)
Total Bilirubin: 0.2 mg/dL — ABNORMAL LOW (ref 0.3–1.2)

## 2013-06-08 LAB — SALICYLATE LEVEL: Salicylate Lvl: <2 mg/dL — ABNORMAL LOW (ref 2.8–20.0)

## 2013-06-08 LAB — CBC
HCT: 41.6 % (ref 36.0–46.0)
Hemoglobin: 14.8 g/dL (ref 12.0–15.0)
MCH: 32.1 pg (ref 26.0–34.0)
MCV: 90.2 fL (ref 78.0–100.0)
RBC: 4.61 MIL/uL (ref 3.87–5.11)

## 2013-06-08 LAB — RAPID URINE DRUG SCREEN, HOSP PERFORMED
Opiates: NOT DETECTED
Tetrahydrocannabinol: NOT DETECTED

## 2013-06-08 LAB — POCT I-STAT TROPONIN I: Troponin i, poc: 0 ng/mL (ref 0.00–0.08)

## 2013-06-08 LAB — POCT PREGNANCY, URINE: Preg Test, Ur: NEGATIVE

## 2013-06-08 LAB — D-DIMER, QUANTITATIVE: D-Dimer, Quant: <0.27 ug/mL-FEU (ref 0.00–0.48)

## 2013-06-08 MED ORDER — LORAZEPAM 1 MG PO TABS
2.0000 mg | ORAL_TABLET | Freq: Once | ORAL | Status: AC
  Administered 2013-06-08: 2 mg via ORAL
  Filled 2013-06-08: qty 2
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
## 2013-06-08 NOTE — ED Provider Notes (Addendum)
CSN: [id number]     Arrival date & time 06/08/13  2213 History   First MD Initiated Contact with Patient 06/08/13 2228     Chief Complaint  Patient presents with  . Medical Clearance  . Chest Pain   (Consider location/radiation/quality/duration/timing/severity/associated sxs/prior Treatment) HPI Comments: Patient referred to the emergency Department for behavioral health [facility] for medical clearance prior to admission for depression. Patient reports that she has been having chest pain for 2 months. He has been continuous and unrelenting. It does not improve or worsen with exertion. Patient reports severe anxiety. She has had problems with anger management. Although she is depressed, she is not homicidal or suicidal.  Patient is a 32 y.o. female presenting with chest pain.  Chest Pain   Past Medical History  Diagnosis Date  . Gallbladder problem   . Anxiety   . Obsessive compulsive disorder   . Depression   . Back pain   . Chronic pain syndrome    Past Surgical History  Procedure Laterality Date  . Cholecystectomy  2009  . Wisdom teeth extraction    . Cesarean section     Family History  Problem Relation Age of Onset  . Diabetes insipidus Father   . Arthritis Father   . Heart failure Father   . COPD Father   . Emphysema Father   . Asthma Son    History  Substance Use Topics  . Smoking status: Current Every Day Smoker -- 1.00 packs/day for 17 years    Types: Cigarettes  . Smokeless tobacco: Never Used  . Alcohol Use: Yes     Comment: unsure amount (1/6) - states generally does not drink   OB History   Grav Para Term Preterm Abortions TAB SAB Ect Mult Living   1 1 1       1      Review of Systems  Cardiovascular: Positive for chest pain.  Psychiatric/Behavioral: The patient is nervous/anxious.   All other systems reviewed and are negative.    Allergies  Orange oil; Carisoprodol; and Piroxicam  Home Medications   Current Outpatient Rx  Name  Route  Sig   Dispense  Refill  . gabapentin (NEURONTIN) 300 MG capsule   Oral   Take 300-1,200 mg by mouth 3 (three) times daily. Take two capsules twice daily and 4 capsules at bedtime         . lamoTRIgine (LAMICTAL) 200 MG tablet   Oral   Take 200 mg by mouth daily.          . sertraline (ZOLOFT) 100 MG tablet   Oral   Take 200 mg by mouth daily.         . traZODone (DESYREL) 100 MG tablet   Oral   Take 100 mg by mouth at bedtime.          BP 129/84  Pulse 130  Temp(Src) 99.8 F (37.7 C) (Oral)  Resp 18  SpO2 98%  LMP 05/31/2013 Physical Exam  Constitutional: She is oriented to person, place, and time. She appears well-developed and well-nourished. She appears distressed.  HENT:  Head: Normocephalic and atraumatic.  Right Ear: Hearing normal.  Left Ear: Hearing normal.  Nose: Nose normal.  Mouth/Throat: Oropharynx is clear and moist and mucous membranes are normal.  Eyes: Conjunctivae and EOM are normal. Pupils are equal, round, and reactive to light.  Neck: Normal range of motion. Neck supple.  Cardiovascular: Regular rhythm, S1 normal and S2 normal.  Tachycardia present.  Exam  reveals no gallop and no friction rub.   No murmur heard. Pulmonary/Chest: Effort normal and breath sounds normal. No respiratory distress. She exhibits no tenderness.  Abdominal: Soft. Normal appearance and bowel sounds are normal. There is no hepatosplenomegaly. There is no tenderness. There is no rebound, no guarding, no tenderness at McBurney's point and negative Murphy's sign. No hernia.  Musculoskeletal: Normal range of motion.  Neurological: She is alert and oriented to person, place, and time. She has normal strength. No cranial nerve deficit or sensory deficit. Coordination normal. GCS eye subscore is 4. GCS verbal subscore is 5. GCS motor subscore is 6.  Skin: Skin is warm, dry and intact. No rash noted. No cyanosis.  Psychiatric: Her speech is normal. Thought content normal. Her mood appears  anxious. She is agitated. She exhibits a depressed mood.    ED Course  Procedures (including critical care time)  EKG:  Date: 06/09/2013  Rate: 133  Rhythm: sinus tachycardia  QRS Axis: normal  Intervals: normal  ST/T Wave abnormalities: nonspecific ST/T changes  Conduction Disutrbances:none  Narrative Interpretation:   Old EKG Reviewed: now tachy    Labs Review Labs Reviewed  CBC - Abnormal; Notable for the following:    WBC 16.9 (*)    Platelets 433 (*)    All other components within normal limits  COMPREHENSIVE METABOLIC PANEL - Abnormal; Notable for the following:    Sodium 134 (*)    Glucose, Bld 127 (*)    Total Bilirubin 0.2 (*)    All other components within normal limits  SALICYLATE LEVEL - Abnormal; Notable for the following:    Salicylate Lvl <2.0 (*)    All other components within normal limits  ACETAMINOPHEN LEVEL  ETHANOL  URINE RAPID DRUG SCREEN (HOSP PERFORMED)  D-DIMER, QUANTITATIVE  POCT I-STAT TROPONIN I  POCT PREGNANCY, URINE   Imaging Review No results found.  MDM  Diagnosis: 1. Depression 2. Anxiety 3. Atypical chest pain   Patient presents today for evaluation of chest pain. She has had continuous pain without improvement for 2 months. This is not consistent with cardiac chest pain. Symptoms are very atypical. She is extremely anxious and agitated, likely exacerbating her symptoms. She was tachycardic on arrival, which was felt to be secondary to her anxiety. This was treated with Ativan. Cardiac workup was negative including d-dimer. I do not have any concerns for PE. Recent is medically clear for treatment at psychiatric hospital.    Gilda Crease, MD 06/08/13 7829  Gilda Crease, MD 06/09/13 [phone]

## 2013-06-08 NOTE — ED Notes (Addendum)
Pt reports 8/10 centralized chest pain that has been intermittent over the past month. Pt reports the pain as aching and tight. Pt also reports shortness of breath and back pain. Pt has a history of elevated cholesterol and smoking. Pt was sent to [facility] ED for medical clearance from [facility], pt has a bed assignment at [facility] upon becoming medically cleared. Pt reports depression, anger, and anxiety. Pt denies drug use or audio or visual hallucinations. Pt denies SI or HI.

## 2013-06-08 NOTE — Progress Notes (Signed)
Report called to ED Charge Nurse, Misty Stanley, prior to transfer to [facility] for medical clearance. Rosey Bath, RN

## 2013-06-09 ENCOUNTER — Inpatient Hospital Stay (HOSPITAL_COMMUNITY)
Admission: AD | Admit: 2013-06-09 | Discharge: 2013-06-13 | DRG: 885 | Disposition: A | Discharge status: Home / Self Care | Payer: Federal, State, Local not specified - Other | Attending: Psychiatry | Admitting: Psychiatry

## 2013-06-09 ENCOUNTER — Encounter (HOSPITAL_COMMUNITY): Payer: Self-pay | Admitting: *Deleted

## 2013-06-09 DIAGNOSIS — Z79899 Other long term (current) drug therapy: Secondary | ICD-10-CM

## 2013-06-09 DIAGNOSIS — F329 Major depressive disorder, single episode, unspecified: Secondary | ICD-10-CM

## 2013-06-09 DIAGNOSIS — F3130 Bipolar disorder, current episode depressed, mild or moderate severity, unspecified: Principal | ICD-10-CM | POA: Diagnosis present

## 2013-06-09 DIAGNOSIS — R45851 Suicidal ideations: Secondary | ICD-10-CM

## 2013-06-09 DIAGNOSIS — G894 Chronic pain syndrome: Secondary | ICD-10-CM | POA: Diagnosis present

## 2013-06-09 DIAGNOSIS — M549 Dorsalgia, unspecified: Secondary | ICD-10-CM

## 2013-06-09 DIAGNOSIS — R4589 Other symptoms and signs involving emotional state: Secondary | ICD-10-CM | POA: Diagnosis present

## 2013-06-09 DIAGNOSIS — F1010 Alcohol abuse, uncomplicated: Secondary | ICD-10-CM | POA: Diagnosis present

## 2013-06-09 DIAGNOSIS — F411 Generalized anxiety disorder: Secondary | ICD-10-CM | POA: Diagnosis present

## 2013-06-09 DIAGNOSIS — F429 Obsessive-compulsive disorder, unspecified: Secondary | ICD-10-CM | POA: Diagnosis present

## 2013-06-09 DIAGNOSIS — F319 Bipolar disorder, unspecified: Secondary | ICD-10-CM

## 2013-06-09 DIAGNOSIS — F17200 Nicotine dependence, unspecified, uncomplicated: Secondary | ICD-10-CM

## 2013-06-09 DIAGNOSIS — T424X5A Adverse effect of benzodiazepines, initial encounter: Secondary | ICD-10-CM

## 2013-06-09 DIAGNOSIS — G4700 Insomnia, unspecified: Secondary | ICD-10-CM

## 2013-06-09 DIAGNOSIS — F333 Major depressive disorder, recurrent, severe with psychotic symptoms: Secondary | ICD-10-CM | POA: Diagnosis present

## 2013-06-09 LAB — URINALYSIS, ROUTINE W REFLEX MICROSCOPIC
Bilirubin Urine: NEGATIVE
Hgb urine dipstick: NEGATIVE
Ketones, ur: NEGATIVE mg/dL
Nitrite: NEGATIVE
Urobilinogen, UA: 0.2 mg/dL (ref 0.0–1.0)

## 2013-06-09 MED ORDER — ACETAMINOPHEN 325 MG PO TABS
650.0000 mg | ORAL_TABLET | Freq: Four times a day (QID) | ORAL | Status: DC | PRN
  Administered 2013-06-12 (×3): 650 mg via ORAL

## 2013-06-09 MED ORDER — TRAZODONE HCL 100 MG PO TABS
100.0000 mg | ORAL_TABLET | Freq: Every evening | ORAL | Status: DC | PRN
  Administered 2013-06-10 – 2013-06-12 (×6): 100 mg via ORAL
  Filled 2013-06-09 (×10): qty 1

## 2013-06-09 MED ORDER — SERTRALINE HCL 100 MG PO TABS
200.0000 mg | ORAL_TABLET | Freq: Every day | ORAL | Status: DC
  Administered 2013-06-10 – 2013-06-13 (×4): 200 mg via ORAL
  Filled 2013-06-09 (×5): qty 2

## 2013-06-09 MED ORDER — GABAPENTIN 300 MG PO CAPS
600.0000 mg | ORAL_CAPSULE | Freq: Two times a day (BID) | ORAL | Status: DC
  Administered 2013-06-09 – 2013-06-13 (×8): 600 mg via ORAL
  Filled 2013-06-09 (×3): qty 2
  Filled 2013-06-09: qty 1
  Filled 2013-06-09 (×7): qty 2

## 2013-06-09 MED ORDER — TRAZODONE HCL 100 MG PO TABS
100.0000 mg | ORAL_TABLET | Freq: Every day | ORAL | Status: DC
  Filled 2013-06-09 (×2): qty 1

## 2013-06-09 MED ORDER — SERTRALINE HCL 100 MG PO TABS
200.0000 mg | ORAL_TABLET | Freq: Every day | ORAL | Status: DC
  Filled 2013-06-09 (×3): qty 2

## 2013-06-09 MED ORDER — NICOTINE 21 MG/24HR TD PT24
21.0000 mg | MEDICATED_PATCH | Freq: Every day | TRANSDERMAL | Status: DC
  Administered 2013-06-09 – 2013-06-13 (×5): 21 mg via TRANSDERMAL
  Filled 2013-06-09 (×7): qty 1

## 2013-06-09 MED ORDER — PNEUMOCOCCAL VAC POLYVALENT 25 MCG/0.5ML IJ INJ: 0.5000 mL | INJECTION | INTRAMUSCULAR | Status: DC

## 2013-06-09 MED ORDER — METHOCARBAMOL 500 MG PO TABS: 750.0000 mg | ORAL_TABLET | Freq: Four times a day (QID) | ORAL | Status: DC | PRN

## 2013-06-09 MED ORDER — LAMOTRIGINE 100 MG PO TABS
200.0000 mg | ORAL_TABLET | Freq: Every day | ORAL | Status: DC
  Administered 2013-06-09 – 2013-06-13 (×5): 200 mg via ORAL
  Filled 2013-06-09 (×6): qty 1

## 2013-06-09 MED ORDER — GABAPENTIN 300 MG PO CAPS: 300.0000 mg | ORAL_CAPSULE | Freq: Three times a day (TID) | ORAL | Status: DC

## 2013-06-09 MED ORDER — GABAPENTIN 400 MG PO CAPS
1200.0000 mg | ORAL_CAPSULE | Freq: Every day | ORAL | Status: DC
  Administered 2013-06-09 – 2013-06-12 (×4): 1200 mg via ORAL
  Filled 2013-06-09 (×5): qty 3

## 2013-06-09 MED ORDER — TRAZODONE HCL 100 MG PO TABS
100.0000 mg | ORAL_TABLET | Freq: Every day | ORAL | Status: DC
  Administered 2013-06-09 (×2): 100 mg via ORAL
  Filled 2013-06-09: qty 1

## 2013-06-09 MED ORDER — GABAPENTIN 300 MG PO CAPS
600.0000 mg | ORAL_CAPSULE | Freq: Two times a day (BID) | ORAL | Status: DC
  Filled 2013-06-09 (×5): qty 2

## 2013-06-09 MED ORDER — ONDANSETRON HCL 4 MG PO TABS
4.0000 mg | ORAL_TABLET | Freq: Three times a day (TID) | ORAL | Status: DC | PRN
  Administered 2013-06-09: 4 mg via ORAL
  Filled 2013-06-09: qty 1

## 2013-06-09 MED ORDER — ARIPIPRAZOLE 5 MG PO TABS
5.0000 mg | ORAL_TABLET | Freq: Every day | ORAL | Status: DC
  Administered 2013-06-09 – 2013-06-12 (×4): 5 mg via ORAL
  Filled 2013-06-09 (×9): qty 1

## 2013-06-09 MED ORDER — METHOCARBAMOL 500 MG PO TABS
500.0000 mg | ORAL_TABLET | Freq: Four times a day (QID) | ORAL | Status: DC
  Administered 2013-06-09 – 2013-06-12 (×12): 500 mg via ORAL
  Filled 2013-06-09 (×18): qty 1

## 2013-06-09 MED ORDER — SERTRALINE HCL 100 MG PO TABS: 200.0000 mg | ORAL_TABLET | Freq: Every day | ORAL | Status: DC

## 2013-06-09 MED ORDER — NICOTINE 21 MG/24HR TD PT24
MEDICATED_PATCH | TRANSDERMAL | Status: AC
  Filled 2013-06-09: qty 1

## 2013-06-09 MED ORDER — MAGNESIUM HYDROXIDE 400 MG/5ML PO SUSP: 30.0000 mL | Freq: Every day | ORAL | Status: DC | PRN

## 2013-06-09 MED ORDER — ALUM & MAG HYDROXIDE-SIMETH 200-200-20 MG/5ML PO SUSP: 30.0000 mL | ORAL | Status: DC | PRN

## 2013-06-09 NOTE — Tx Team (Signed)
Initial Interdisciplinary Treatment Plan  PATIENT STRENGTHS: (choose at least two) Ability for insight Average or above average intelligence Capable of independent living General fund of knowledge Motivation for treatment/growth Supportive family/friends  PATIENT STRESSORS: Medication change or noncompliance   PROBLEM LIST: Problem List/Patient Goals Date to be addressed Date deferred Reason deferred Estimated date of resolution  Depression 06/09/13     Anxiety 06/09/13     Anger 06/09/13                                          DISCHARGE CRITERIA:  Ability to meet basic life and health needs Improved stabilization in mood, thinking, and/or behavior Verbal commitment to aftercare and medication compliance  PRELIMINARY DISCHARGE PLAN: Attend aftercare/continuing care group Return to previous living arrangement  PATIENT/FAMIILY INVOLVEMENT: This treatment plan has been presented to and reviewed with the patient, Sheritta A Nolon Nations, and/or family member, .  The patient and family have been given the opportunity to ask questions and make suggestions.  Borna Wessinger, [place] 06/09/2013, 2:10 AM

## 2013-06-09 NOTE — Progress Notes (Signed)
Patient ID: [name], female   DOB: 08/24/1981, 32 y.o.   MRN: [id number] Psychoeducational Group Note  Date:  06/09/2013 Time:1100am  Group Topic/Focus:  Identifying Needs:   The focus of this group is to help patients identify their personal needs that have been historically problematic and identify healthy behaviors to address their needs.  Participation Level:  Did Not Attend  Participation Quality:    Affect: Cognitive: Insight:  Engagement in Group: Additional Comments:  Psychoeducational group -life skills  [name] 06/09/2013,12:30 PM

## 2013-06-09 NOTE — Progress Notes (Signed)
Adult Psychoeducational Group Note  Date:  06/09/2013 Time:  1:15pm Group Topic/Focus:  Therapeutic activity  Participation Level:    Participation Quality:    Affect:   Cognitive:    Insight:   Engagement in Group:  Pt did not attend group  Modes of Intervention:  Additional Comments:    Pryor Curia 06/09/2013, 2:27 PM

## 2013-06-09 NOTE — H&P (Signed)
  Pt was seen by me today and I agree with the key elements documented in H&P.  

## 2013-06-09 NOTE — Progress Notes (Signed)
32 year old female pt admitted on voluntary basis after initially presenting as walk-in. On admission, pt reports that she is here because of depression, anxiety, and anger. Pt does report that she goes to [facility] for medication management and feels that the medications are not working as they should. Pt denies SI on admission but does endorse that she has had them in the recent past and is able to contract for safety on the unit. Pt is married with 1 child and states she will be returning to that situation after discharge. Pt denies any ETOH or drug abuse. Pt also reports that her hair is falling out and believes it to be one of the medications that she is currently on. Pt was oriented to the unit and safety maintained.

## 2013-06-09 NOTE — Progress Notes (Signed)
D [name] has spent all day in her bed. She is upset, tearful, wants to go home and is struggling with the fact that she cannot. She is agitated when she tells this Clinical research associate her story " for the 5th time today...". She ays she's been " on everything" and nothing works.Marland KitchenMarland KitchenShe refuses to make eye contact, demans to be DC'd and is taken to the NP to speak with.   AShe is calmer and more in control after speaking with the NP. SHe is started on abilify and neurontin changed to 600 mg @ 0800, 1400 and 1200mg   Bedtime, per her request. She is tolerating the abilify well, is resting in her bed at present and is quiet.   R Safety  Is in place and POC moves forward.
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
## 2013-06-09 NOTE — [facility] Group Notes (Signed)
[facility] Group Notes: (Clinical Social Work)   06/09/2013      Type of Therapy:  Group Therapy   Participation Level:  Did Not Attend    [place] Landgrebe Grossman-Orr, LCSW 06/09/2013, 4:42 PM     
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
## 2013-06-09 NOTE — H&P (Signed)
Psychiatric Admission Assessment Adult  Patient Identification:  [name] Date of Evaluation:  06/09/2013 Chief Complaint:  depression History of Present Illness:  4 months ago, her ex-boyfriend pursued full custody and won on Monday.  Depression and anxiety increased to the point where she started having suicidal ideations but "would never do it, it scared me that I even thought it."  2 years ago she took a whole bottle of blood pressure medication and ended up in ICU, depressed due to her 30th birthday and alone.  Vesta Mixer has been prescribing her medication, she stopped taking her Zoloft when it ran out.  Very tearful on admission, adamant about not being suicidal due to her past attempts were used against her in court.  Her ex had visitation on week-ends and now has full custody, patient is distraught.  Denies drug or alcohol abuse.  Husband is supportive but "does not understand her depression."  Associated Signs/Synptoms: Depression Symptoms:  depressed mood, recurrent thoughts of death, loss of energy/fatigue, disturbed sleep, decreased appetite, (Hypo) Manic Symptoms:  Irritable Mood, Anxiety Symptoms:  Excessive Worry, Psychotic Symptoms:   None PTSD Symptoms: NA  Psychiatric Specialty Exam: Physical Exam  Constitutional: She is oriented to person, place, and time. She appears well-developed and well-nourished.  HENT:  Head: Normocephalic and atraumatic.  Right Ear: External ear normal.  Left Ear: External ear normal.  Nose: Nose normal.  Mouth/Throat: Oropharynx is clear and moist.  Eyes: Conjunctivae and EOM are normal. Pupils are equal, round, and reactive to light.  Neck: Normal range of motion. Neck supple.  Cardiovascular: Normal rate, regular rhythm, normal heart sounds and intact distal pulses.   Respiratory: Effort normal and breath sounds normal.  GI: Soft. Bowel sounds are normal.  Genitourinary:  No issues voiced, exam deferred  Musculoskeletal:  Normal range of motion.  Neurological: She is alert and oriented to person, place, and time. She has normal reflexes.  Skin: Skin is warm and dry.    Review of Systems  Constitutional: Negative.   HENT: Negative.   Eyes: Negative.   Respiratory: Negative.   Cardiovascular: Negative.   Gastrointestinal: Negative.   Genitourinary: Negative.   Musculoskeletal: Negative.   Skin: Negative.   Neurological: Negative.   Endo/Heme/Allergies: Negative.   Psychiatric/Behavioral: Positive for depression and suicidal ideas. The patient is nervous/anxious.     Blood pressure 126/70, pulse 81, temperature 97 F (36.1 C), temperature source Oral, resp. rate 18, last menstrual period 05/31/2013.There is no weight on file to calculate BMI.  General Appearance: Casual  Eye Contact::  Minimal  Speech:  Normal Rate  Volume:  Decreased  Mood:  Anxious and Depressed  Affect:  Congruent  Thought Process:  Coherent  Orientation:  Full (Time, Place, and Person)  Thought Content:  WDL  Suicidal Thoughts:  Yes.  without intent/plan  Homicidal Thoughts:  No  Memory:  Immediate;   Fair Recent;   Fair Remote;   Fair  Judgement:  Impaired  Insight:  Fair  Psychomotor Activity:  Decreased  Concentration:  Fair  Recall:  Fair  Akathisia:  No  Handed:  Right  AIMS (if indicated):     Assets:  Communication Skills Resilience Social Support  Sleep:  Number of Hours: 2.5    Past Psychiatric History: Diagnosis:  [facility] x 1  Hospitalizations:  [facility]  Outpatient Care:  [facility]  Substance Abuse Care:  Denies  Self-Mutilation:  None  Suicidal Attempts:  Overdoses  Violent Behaviors:  None   Past  Medical History:   Past Medical History  Diagnosis Date  . Gallbladder problem   . Anxiety   . Obsessive compulsive disorder   . Depression   . Back pain   . Chronic pain syndrome    None. Allergies:   Allergies  Allergen Reactions  . Orange Oil Shortness Of Breath  . Carisoprodol     Skin peeled    . Piroxicam     Skin peeled   PTA Medications: Prescriptions prior to admission  Medication Sig Dispense Refill  . gabapentin (NEURONTIN) 300 MG capsule Take 300-1,200 mg by mouth 3 (three) times daily. Take two capsules twice daily and 4 capsules at bedtime      . lamoTRIgine (LAMICTAL) 200 MG tablet Take 200 mg by mouth daily.       . sertraline (ZOLOFT) 100 MG tablet Take 200 mg by mouth daily.      . traZODone (DESYREL) 100 MG tablet Take 100 mg by mouth at bedtime.        Previous Psychotropic Medications:  Medication/Dose    See above   Substance Abuse History in the last 12 months:  no  Consequences of Substance Abuse: NA  Social History:  reports that she has been smoking Cigarettes.  She has a 17 pack-year smoking history. She has never used smokeless tobacco. She reports that  drinks alcohol. She reports that she does not use illicit drugs. Additional Social History: Pain Medications: Denies Prescriptions: Taking medication as prescribed Over the Counter: Denies History of alcohol / drug use?: Yes Longest period of sobriety (when/how long): Unspecified. Patient states she is a social drinker.  Negative Consequences of Use: Legal Name of Substance 1: Whiskey 1 - Age of First Use: Unknown 1 - Amount (size/oz): 1/2 to 3/4 pint  1 - Frequency: 3-4  times per week 1 - Duration: "a while" - "to deal with feelings" 1 - Last Use / Amount: today  Current Place of Residence:   Place of Birth:   Family Members: Marital Status:  Married Children:  Sons:  Daughters: Relationships: Education:  [employer] Problems/Performance: Religious Beliefs/Practices: History of Abuse (Emotional/Phsycial/Sexual) Teacher, music History:  None. Legal History: Hobbies/Interests:  Family History:   Family History  Problem Relation Age of Onset  . Diabetes insipidus Father   . Arthritis Father   . Heart failure Father   . COPD Father   .  Emphysema Father   . Asthma Son     Results for orders placed during the hospital encounter of 06/08/13 (from the past 72 hour(s))  ACETAMINOPHEN LEVEL     Status: None   Collection Time    06/08/13 10:40 PM      Result Value Range   Acetaminophen (Tylenol), Serum <15.0  10 - 30 ug/mL   Comment:            THERAPEUTIC CONCENTRATIONS VARY     SIGNIFICANTLY. A RANGE OF 10-30     ug/mL MAY BE AN EFFECTIVE     CONCENTRATION FOR MANY PATIENTS.     HOWEVER, SOME ARE BEST TREATED     AT CONCENTRATIONS OUTSIDE THIS     RANGE.     ACETAMINOPHEN CONCENTRATIONS     >150 ug/mL AT 4 HOURS AFTER     INGESTION AND >50 ug/mL AT 12     HOURS AFTER INGESTION ARE     OFTEN ASSOCIATED WITH TOXIC     REACTIONS.  CBC     Status: Abnormal  Collection Time    06/08/13 10:40 PM      Result Value Range   WBC 16.9 (*) 4.0 - 10.5 K/uL   RBC 4.61  3.87 - 5.11 MIL/uL   Hemoglobin 14.8  12.0 - 15.0 g/dL   HCT 16.1  09.6 - 04.5 %   MCV 90.2  78.0 - 100.0 fL   MCH 32.1  26.0 - 34.0 pg   MCHC 35.6  30.0 - 36.0 g/dL   RDW 40.9  81.1 - 91.4 %   Platelets 433 (*) 150 - 400 K/uL  COMPREHENSIVE METABOLIC PANEL     Status: Abnormal   Collection Time    06/08/13 10:40 PM      Result Value Range   Sodium 134 (*) 135 - 145 mEq/L   Potassium 3.9  3.5 - 5.1 mEq/L   Chloride 99  96 - 112 mEq/L   CO2 23  19 - 32 mEq/L   Glucose, Bld 127 (*) 70 - 99 mg/dL   BUN 8  6 - 23 mg/dL   Creatinine, Ser 7.82  0.50 - 1.10 mg/dL   Calcium 95.6  8.4 - 21.3 mg/dL   Total Protein 7.0  6.0 - 8.3 g/dL   Albumin 4.0  3.5 - 5.2 g/dL   AST 12  0 - 37 U/L   ALT 11  0 - 35 U/L   Alkaline Phosphatase 67  39 - 117 U/L   Total Bilirubin 0.2 (*) 0.3 - 1.2 mg/dL   GFR calc non Af Amer >90  >90 mL/min   GFR calc Af Amer >90  >90 mL/min   Comment: (NOTE)     The eGFR has been calculated using the CKD EPI equation.     This calculation has not been validated in all clinical situations.     eGFR's persistently <90 mL/min signify  possible Chronic Kidney     Disease.  ETHANOL     Status: None   Collection Time    06/08/13 10:40 PM      Result Value Range   Alcohol, Ethyl (B) 11  0 - 11 mg/dL   Comment:            LOWEST DETECTABLE LIMIT FOR     SERUM ALCOHOL IS 11 mg/dL     FOR MEDICAL PURPOSES ONLY  SALICYLATE LEVEL     Status: Abnormal   Collection Time    06/08/13 10:40 PM      Result Value Range   Salicylate Lvl <2.0 (*) 2.8 - 20.0 mg/dL  D-DIMER, QUANTITATIVE     Status: None   Collection Time    06/08/13 10:40 PM      Result Value Range   D-Dimer, Quant <0.27  0.00 - 0.48 ug/mL-FEU   Comment:            AT THE INHOUSE ESTABLISHED CUTOFF     VALUE OF 0.48 ug/mL FEU,     THIS ASSAY HAS BEEN DOCUMENTED     IN THE LITERATURE TO HAVE     A SENSITIVITY AND NEGATIVE     PREDICTIVE VALUE OF AT LEAST     98 TO 99%.  THE TEST RESULT     SHOULD BE CORRELATED WITH     AN ASSESSMENT OF THE CLINICAL     PROBABILITY OF DVT / VTE.  POCT I-STAT TROPONIN I     Status: None   Collection Time    06/08/13 10:47 PM      Result  Value Range   Troponin i, poc 0.00  0.00 - 0.08 ng/mL   Comment 3            Comment: Due to the release kinetics of cTnI,     a negative result within the first hours     of the onset of symptoms does not rule out     myocardial infarction with certainty.     If myocardial infarction is still suspected,     repeat the test at appropriate intervals.  URINE RAPID DRUG SCREEN (HOSP PERFORMED)     Status: None   Collection Time    06/08/13 11:02 PM      Result Value Range   Opiates NONE DETECTED  NONE DETECTED   Cocaine NONE DETECTED  NONE DETECTED   Benzodiazepines NONE DETECTED  NONE DETECTED   Amphetamines NONE DETECTED  NONE DETECTED   Tetrahydrocannabinol NONE DETECTED  NONE DETECTED   Barbiturates NONE DETECTED  NONE DETECTED   Comment:            DRUG SCREEN FOR MEDICAL PURPOSES     ONLY.  IF CONFIRMATION IS NEEDED     FOR ANY PURPOSE, NOTIFY LAB     WITHIN 5 DAYS.                 LOWEST DETECTABLE LIMITS     FOR URINE DRUG SCREEN     Drug Class       Cutoff (ng/mL)     Amphetamine      1000     Barbiturate      200     Benzodiazepine   200     Tricyclics       300     Opiates          300     Cocaine          300     THC              50  URINALYSIS, ROUTINE W REFLEX MICROSCOPIC     Status: None   Collection Time    06/08/13 11:02 PM      Result Value Range   Color, Urine YELLOW  YELLOW   APPearance CLEAR  CLEAR   Specific Gravity, Urine 1.018  1.005 - 1.030   pH 6.0  5.0 - 8.0   Glucose, UA NEGATIVE  NEGATIVE mg/dL   Hgb urine dipstick NEGATIVE  NEGATIVE   Bilirubin Urine NEGATIVE  NEGATIVE   Ketones, ur NEGATIVE  NEGATIVE mg/dL   Protein, ur NEGATIVE  NEGATIVE mg/dL   Urobilinogen, UA 0.2  0.0 - 1.0 mg/dL   Nitrite NEGATIVE  NEGATIVE   Leukocytes, UA NEGATIVE  NEGATIVE   Comment: MICROSCOPIC NOT DONE ON URINES WITH NEGATIVE PROTEIN, BLOOD, LEUKOCYTES, NITRITE, OR GLUCOSE <1000 mg/dL.  POCT PREGNANCY, URINE     Status: None   Collection Time    06/08/13 11:09 PM      Result Value Range   Preg Test, Ur NEGATIVE  NEGATIVE   Comment:            THE SENSITIVITY OF THIS     METHODOLOGY IS >24 mIU/mL   Psychological Evaluations:  Assessment:   DSM5:  Depressive Disorders:  Major Depressive Disorder - Severe (296.23)  AXIS I:  Alcohol Abuse, Anxiety Disorder NOS and Major Depression, Recurrent severe AXIS II:  Deferred AXIS III:   Past Medical History  Diagnosis Date  . Gallbladder problem   .  Anxiety   . Obsessive compulsive disorder   . Depression   . Back pain   . Chronic pain syndrome    AXIS IV:  other psychosocial or environmental problems, problems related to social environment and problems with primary support group AXIS V:  41-50 serious symptoms  Treatment Plan/Recommendations:  Treatment Plan/Recommendations:  Plan:  Review of chart, vital signs, medications, and notes. 1-Admit for crisis management and stabilization.   Estimated length of stay 5-7 days past his current stay of 1 2-Individual and group therapy encouraged 3-Medication management for depression and anxiety to reduce current symptoms to base line and improve the patient's overall level of functioning:  Medications reviewed with the patient and she stated no untoward effects, Abilify started for her depression 4-Coping skills for depression and anxiety developing-- 5-Continue crisis stabilization and management 6-Address health issues--monitoring vital signs, stable  7-Treatment plan in progress to prevent relapse of depression and anxiety 8-Psychosocial education regarding relapse prevention and self-care 8-Health care follow up as needed for any health concerns  9-Call for consult with hospitalist for additional specialty patient services as needed.  Treatment Plan Summary: Daily contact with patient to assess and evaluate symptoms and progress in treatment Medication management Current Medications:  Current Facility-Administered Medications  Medication Dose Route Frequency Provider Last Rate Last Dose  . acetaminophen (TYLENOL) tablet 650 mg  650 mg Oral Q6H PRN Court Joy, PA-C      . alum & mag hydroxide-simeth (MAALOX/MYLANTA) 200-200-20 MG/5ML suspension 30 mL  30 mL Oral Q4H PRN Court Joy, PA-C      . gabapentin (NEURONTIN) capsule 1,200 mg  1,200 mg Oral QHS Court Joy, PA-C      . gabapentin (NEURONTIN) capsule 600 mg  600 mg Oral BID WC Court Joy, PA-C      . lamoTRIgine (LAMICTAL) tablet 200 mg  200 mg Oral Daily Court Joy, PA-C   200 mg at 06/09/13 0850  . magnesium hydroxide (MILK OF MAGNESIA) suspension 30 mL  30 mL Oral Daily PRN Court Joy, PA-C      . methocarbamol (ROBAXIN) tablet 750 mg  750 mg Oral Q6H PRN Court Joy, PA-C      . nicotine (NICODERM CQ - dosed in mg/24 hours) 21 mg/24hr patch           . nicotine (NICODERM CQ - dosed in mg/24 hours) patch 21 mg  21 mg Transdermal Daily  Nehemiah Settle, MD      . sertraline (ZOLOFT) tablet 200 mg  200 mg Oral Daily Court Joy, PA-C      . traZODone (DESYREL) tablet 100 mg  100 mg Oral QHS Court Joy, PA-C        Observation Level/Precautions:  15 minute checks  Laboratory:   Completed in ED, reviewed, stable  Psychotherapy:  Individual and group therapy  Medications:  See above  Consultations:  None  Discharge Concerns:  None    Estimated LOS:  5-7 days  Other:     I certify that inpatient services furnished can reasonably be expected to improve the patient's condition.   Nanine Means, PMH-NP 8/30/201412:58 PM

## 2013-06-10 NOTE — Progress Notes (Signed)
[facility] MD Progress Note  06/10/2013 12:10 PM [name]  MRN:  [id number] Subjective:  Sleep not good but awake to a better outlook on life, smiling, feels better having her anti-depressant back even though she knows it has taken full effect.  She has spoken to her husband and it was positive.  Yesterday she was very tearful and angry about losing custody of her son, today she did not even mention him.  She is focused on leaving and wants to go today because she has a court date on Tuesday at 9 am.  When I explained that the regular MD will review discharges tomorrow, she became insistent on leaving because her husband is working all day tomorrow and cannot come to get her (if she is discharged).  No insight to the fact she was just admitted and distraught yesterday.    Diagnosis:   DSM5:  Depressive Disorders:  Major Depressive Disorder - Severe (296.23)  Axis I: Anxiety Disorder NOS and Major Depression, Recurrent severe Axis II: Deferred Axis III:  Past Medical History  Diagnosis Date  . Gallbladder problem   . Anxiety   . Obsessive compulsive disorder   . Depression   . Back pain   . Chronic pain syndrome    Axis IV: other psychosocial or environmental problems, problems related to social environment and problems with primary support group Axis V: 41-50 serious symptoms  ADL's:  Intact  Sleep: Poor  Appetite:  Good  Suicidal Ideation:  Denies Homicidal Ideation:  Denies  Psychiatric Specialty Exam: Review of Systems  Constitutional: Negative.   HENT: Negative.   Eyes: Negative.   Respiratory: Negative.   Cardiovascular: Negative.   Gastrointestinal: Negative.   Genitourinary: Negative.   Musculoskeletal: Positive for back pain.  Skin: Negative.   Neurological: Negative.   Endo/Heme/Allergies: Negative.   Psychiatric/Behavioral: Positive for depression. The patient is nervous/anxious.     Blood pressure 111/79, pulse 120, temperature 97.8 F (36.6 C),  temperature source Oral, resp. rate 20, last menstrual period 05/31/2013.There is no weight on file to calculate BMI.  General Appearance: Casual  Eye Contact::  Fair  Speech:  Normal Rate  Volume:  Normal  Mood:  Anxious  Affect:  Congruent  Thought Process:  Coherent  Orientation:  Full (Time, Place, and Person)  Thought Content:  WDL  Suicidal Thoughts:  No  Homicidal Thoughts:  No  Memory:  Immediate;   Fair Recent;   Fair Remote;   Fair  Judgement:  Poor  Insight:  Lacking  Psychomotor Activity:  Decreased  Concentration:  Fair  Recall:  Fair  Akathisia:  No  Handed:  Right  AIMS (if indicated):     Assets:  Communication Skills Physical Health Resilience Social Support  Sleep:  Number of Hours: 6.75   Current Medications: Current Facility-Administered Medications  Medication Dose Route Frequency Provider Last Rate Last Dose  . acetaminophen (TYLENOL) tablet 650 mg  650 mg Oral Q6H PRN Court Joy, PA-C      . alum & mag hydroxide-simeth (MAALOX/MYLANTA) 200-200-20 MG/5ML suspension 30 mL  30 mL Oral Q4H PRN Court Joy, PA-C      . ARIPiprazole (ABILIFY) tablet 5 mg  5 mg Oral Daily Nanine Means, NP   5 mg at 06/10/13 0810  . gabapentin (NEURONTIN) capsule 1,200 mg  1,200 mg Oral QHS Court Joy, PA-C   1,200 mg at 06/09/13 2159  . gabapentin (NEURONTIN) capsule 600 mg  600 mg Oral BID  WC Nanine Means, NP   600 mg at 06/10/13 0808  . lamoTRIgine (LAMICTAL) tablet 200 mg  200 mg Oral Daily Court Joy, PA-C   200 mg at 06/10/13 0813  . magnesium hydroxide (MILK OF MAGNESIA) suspension 30 mL  30 mL Oral Daily PRN Court Joy, PA-C      . methocarbamol (ROBAXIN) tablet 500 mg  500 mg Oral QID Nanine Means, NP   500 mg at 06/10/13 0810  . nicotine (NICODERM CQ - dosed in mg/24 hours) patch 21 mg  21 mg Transdermal Daily Nehemiah Settle, MD   21 mg at 06/10/13 4098  . ondansetron (ZOFRAN) tablet 4 mg  4 mg Oral Q8H PRN Jorje Guild, PA-C   4 mg at  06/09/13 2159  . sertraline (ZOLOFT) tablet 200 mg  200 mg Oral Daily Nanine Means, NP   200 mg at 06/10/13 0807  . traZODone (DESYREL) tablet 100 mg  100 mg Oral QHS,MR X 1 Nanine Means, NP        Lab Results:  Results for orders placed during the hospital encounter of 06/08/13 (from the past 48 hour(s))  ACETAMINOPHEN LEVEL     Status: None   Collection Time    06/08/13 10:40 PM      Result Value Range   Acetaminophen (Tylenol), Serum <15.0  10 - 30 ug/mL   Comment:            THERAPEUTIC CONCENTRATIONS VARY     SIGNIFICANTLY. A RANGE OF 10-30     ug/mL MAY BE AN EFFECTIVE     CONCENTRATION FOR MANY PATIENTS.     HOWEVER, SOME ARE BEST TREATED     AT CONCENTRATIONS OUTSIDE THIS     RANGE.     ACETAMINOPHEN CONCENTRATIONS     >150 ug/mL AT 4 HOURS AFTER     INGESTION AND >50 ug/mL AT 12     HOURS AFTER INGESTION ARE     OFTEN ASSOCIATED WITH TOXIC     REACTIONS.  CBC     Status: Abnormal   Collection Time    06/08/13 10:40 PM      Result Value Range   WBC 16.9 (*) 4.0 - 10.5 K/uL   RBC 4.61  3.87 - 5.11 MIL/uL   Hemoglobin 14.8  12.0 - 15.0 g/dL   HCT 11.9  14.7 - 82.9 %   MCV 90.2  78.0 - 100.0 fL   MCH 32.1  26.0 - 34.0 pg   MCHC 35.6  30.0 - 36.0 g/dL   RDW 56.2  13.0 - 86.5 %   Platelets 433 (*) 150 - 400 K/uL  COMPREHENSIVE METABOLIC PANEL     Status: Abnormal   Collection Time    06/08/13 10:40 PM      Result Value Range   Sodium 134 (*) 135 - 145 mEq/L   Potassium 3.9  3.5 - 5.1 mEq/L   Chloride 99  96 - 112 mEq/L   CO2 23  19 - 32 mEq/L   Glucose, Bld 127 (*) 70 - 99 mg/dL   BUN 8  6 - 23 mg/dL   Creatinine, Ser 7.84  0.50 - 1.10 mg/dL   Calcium 69.6  8.4 - 29.5 mg/dL   Total Protein 7.0  6.0 - 8.3 g/dL   Albumin 4.0  3.5 - 5.2 g/dL   AST 12  0 - 37 U/L   ALT 11  0 - 35 U/L   Alkaline Phosphatase 67  39 -  117 U/L   Total Bilirubin 0.2 (*) 0.3 - 1.2 mg/dL   GFR calc non Af Amer >90  >90 mL/min   GFR calc Af Amer >90  >90 mL/min   Comment: (NOTE)      The eGFR has been calculated using the CKD EPI equation.     This calculation has not been validated in all clinical situations.     eGFR's persistently <90 mL/min signify possible Chronic Kidney     Disease.  ETHANOL     Status: None   Collection Time    06/08/13 10:40 PM      Result Value Range   Alcohol, Ethyl (B) 11  0 - 11 mg/dL   Comment:            LOWEST DETECTABLE LIMIT FOR     SERUM ALCOHOL IS 11 mg/dL     FOR MEDICAL PURPOSES ONLY  SALICYLATE LEVEL     Status: Abnormal   Collection Time    06/08/13 10:40 PM      Result Value Range   Salicylate Lvl <2.0 (*) 2.8 - 20.0 mg/dL  D-DIMER, QUANTITATIVE     Status: None   Collection Time    06/08/13 10:40 PM      Result Value Range   D-Dimer, Quant <0.27  0.00 - 0.48 ug/mL-FEU   Comment:            AT THE INHOUSE ESTABLISHED CUTOFF     VALUE OF 0.48 ug/mL FEU,     THIS ASSAY HAS BEEN DOCUMENTED     IN THE LITERATURE TO HAVE     A SENSITIVITY AND NEGATIVE     PREDICTIVE VALUE OF AT LEAST     98 TO 99%.  THE TEST RESULT     SHOULD BE CORRELATED WITH     AN ASSESSMENT OF THE CLINICAL     PROBABILITY OF DVT / VTE.  POCT I-STAT TROPONIN I     Status: None   Collection Time    06/08/13 10:47 PM      Result Value Range   Troponin i, poc 0.00  0.00 - 0.08 ng/mL   Comment 3            Comment: Due to the release kinetics of cTnI,     a negative result within the first hours     of the onset of symptoms does not rule out     myocardial infarction with certainty.     If myocardial infarction is still suspected,     repeat the test at appropriate intervals.  URINE RAPID DRUG SCREEN (HOSP PERFORMED)     Status: None   Collection Time    06/08/13 11:02 PM      Result Value Range   Opiates NONE DETECTED  NONE DETECTED   Cocaine NONE DETECTED  NONE DETECTED   Benzodiazepines NONE DETECTED  NONE DETECTED   Amphetamines NONE DETECTED  NONE DETECTED   Tetrahydrocannabinol NONE DETECTED  NONE DETECTED   Barbiturates NONE  DETECTED  NONE DETECTED   Comment:            DRUG SCREEN FOR MEDICAL PURPOSES     ONLY.  IF CONFIRMATION IS NEEDED     FOR ANY PURPOSE, NOTIFY LAB     WITHIN 5 DAYS.                LOWEST DETECTABLE LIMITS     FOR URINE DRUG SCREEN     Drug  Class       Cutoff (ng/mL)     Amphetamine      1000     Barbiturate      200     Benzodiazepine   200     Tricyclics       300     Opiates          300     Cocaine          300     THC              50  URINALYSIS, ROUTINE W REFLEX MICROSCOPIC     Status: None   Collection Time    06/08/13 11:02 PM      Result Value Range   Color, Urine YELLOW  YELLOW   APPearance CLEAR  CLEAR   Specific Gravity, Urine 1.018  1.005 - 1.030   pH 6.0  5.0 - 8.0   Glucose, UA NEGATIVE  NEGATIVE mg/dL   Hgb urine dipstick NEGATIVE  NEGATIVE   Bilirubin Urine NEGATIVE  NEGATIVE   Ketones, ur NEGATIVE  NEGATIVE mg/dL   Protein, ur NEGATIVE  NEGATIVE mg/dL   Urobilinogen, UA 0.2  0.0 - 1.0 mg/dL   Nitrite NEGATIVE  NEGATIVE   Leukocytes, UA NEGATIVE  NEGATIVE   Comment: MICROSCOPIC NOT DONE ON URINES WITH NEGATIVE PROTEIN, BLOOD, LEUKOCYTES, NITRITE, OR GLUCOSE <1000 mg/dL.  POCT PREGNANCY, URINE     Status: None   Collection Time    06/08/13 11:09 PM      Result Value Range   Preg Test, Ur NEGATIVE  NEGATIVE   Comment:            THE SENSITIVITY OF THIS     METHODOLOGY IS >24 mIU/mL    Physical Findings: AIMS: Facial and Oral Movements Muscles of Facial Expression: None, normal Lips and Perioral Area: None, normal Jaw: None, normal Tongue: None, normal,Extremity Movements Upper (arms, wrists, hands, fingers): None, normal Lower (legs, knees, ankles, toes): None, normal, Trunk Movements Neck, shoulders, hips: None, normal, Overall Severity Severity of abnormal movements (highest score from questions above): None, normal Incapacitation due to abnormal movements: None, normal Patient's awareness of abnormal movements (rate only patient's report): No  Awareness, Dental Status Current problems with teeth and/or dentures?: No Does patient usually wear dentures?: No  CIWA:  CIWA-Ar Total: 2 COWS:     Treatment Plan Summary: Daily contact with patient to assess and evaluate symptoms and progress in treatment Medication management  Plan:  Review of chart, vital signs, medications, and notes. 1-Individual and group therapy 2-Medication management for depression and anxiety:  Medications reviewed with the patient and no untoward effects, no changes made 3-Coping skills for depression, anxiety, and alcohol abuse 4-Continue crisis stabilization and management 5-Address health issues--monitoring vital signs, stable 6-Treatment plan in progress to prevent relapse of depression, alcohol abuse, and anxiety  Medical Decision Making Problem Points:  Established problem, stable/improving (1) and Review of psycho-social stressors (1) Data Points:  Review of medication regiment & side effects (2)  I certify that inpatient services furnished can reasonably be expected to improve the patient's condition.   Nanine Means, PMH-NP 06/10/2013, 12:10 PM

## 2013-06-10 NOTE — [facility] Group Notes (Signed)
[facility] Group Notes:  (Clinical Social Work)  06/10/2013   3:00-4:00PM  Summary of Progress/Problems:   The main focus of today's process group was to   identify the patient's current support system and decide on other supports that can be put in place.  The picture on workbook was used to discuss why additional supports are needed, and a hand-out was distributed with four definitions/levels of support, then used to talk about how patients have given and received all different kinds of support.  An emphasis was placed on using counselor, doctor, therapy groups, 12-step groups, and problem-specific support groups to expand supports.  The patient identified one additional support as being adding a therapist (now sees just psychiatrist she says).  She also wants to pick back up some of her musical instruments, start painting again.  She was insightful in helping other patients to become more prepared with the idea of going to a therapist for the first time.  Type of Therapy:  Process Group  Participation Level:  Active  Participation Quality:  Attentive, Sharing and Supportive  Affect:  Blunted  Cognitive:  Appropriate and Oriented  Insight:  Engaged  Engagement in Therapy:  Engaged  Modes of Intervention:  Education,  Support and [employer], LCSW 06/10/2013, 4:37 PM

## 2013-06-10 NOTE — Progress Notes (Addendum)
[name] is seen OOB UAL on the 500 hall today. She is up and dressed right after breakfast this morning. SHe comes to the med window independently, takes her meds as ordered, is controlled and appropriate in her speech and her manner.   A SHe says her robaxone and neurontin scheduled are helping her. She attends her groups and is engaged in the group discussion.   R Safety is in place and POC moves forward.

## 2013-06-10 NOTE — Progress Notes (Signed)
Patient ID: [name], female   DOB: 16-Apr-1981, 32 y.o.   MRN: [id number] Psychoeducational Group Note  Date:  06/10/2013 Time:  0930am  Group Topic/Focus:  Making Healthy Choices:   The focus of this group is to help patients identify negative/unhealthy choices they were using prior to admission and identify positive/healthier coping strategies to replace them upon discharge.  Participation Level:  Active  Participation Quality:  Appropriate  Affect:  Appropriate  Cognitive:  Appropriate  Insight:  Supportive  Engagement in Group:  Supportive  Additional Comments:  Psychoeducational group-greatfulness  [name] 06/10/2013,10:26 AM

## 2013-06-10 NOTE — Progress Notes (Signed)
Patient ID: [name], female   DOB: 25-Oct-1980, 32 y.o.   MRN: [id number] Psychoeducational Group Note  Date:  06/10/2013 Time:  1045am  Group Topic/Focus:  Making Healthy Choices:   The focus of this group is to help patients identify negative/unhealthy choices they were using prior to admission and identify positive/healthier coping strategies to replace them upon discharge.  Participation Level:  Active  Participation Quality:  Appropriate  Affect:  Appropriate  Cognitive:  Appropriate  Insight:  Supportive  Engagement in Group:  Supportive  Additional Comments:  Psychoeducational group -life skills   [name] 06/10/2013,12:17 PM

## 2013-06-10 NOTE — Progress Notes (Signed)
Pt resting in bed with eyes closed. Breathing even and unlabored. Q15 min safety checks maintained. Will continue to monitor pt.  

## 2013-06-11 DIAGNOSIS — F319 Bipolar disorder, unspecified: Secondary | ICD-10-CM

## 2013-06-11 DIAGNOSIS — F3130 Bipolar disorder, current episode depressed, mild or moderate severity, unspecified: Secondary | ICD-10-CM

## 2013-06-11 NOTE — Progress Notes (Signed)
Patient ID: [name], female   DOB: 02/18/1981, 32 y.o.   MRN: [id number] [facility] MD Progress Note  06/11/2013 1:41 PM [name]  MRN:  [id number]  Subjective:  Patient is seen and chart reviewed. Patient stated that she is adjusting to her current medications and positively responding. She said that she has court date tomorrow regarding child custody as per the note her child father won the case because she had previous suicidal attempt in 2011. Patient stated that her previous medication did not work after some time. She is focused on leaving and wants to be discharged because she has a court date on Tuesday. Patient has no insight to the fact she was just admitted and distraught yesterday.    Diagnosis:   DSM5:  Depressive Disorders:  Major Depressive Disorder - Severe (296.23)  Axis I: Anxiety Disorder NOS and Major Depression, Recurrent severe Axis II: Deferred Axis III:  Past Medical History  Diagnosis Date  . Gallbladder problem   . Anxiety   . Obsessive compulsive disorder   . Depression   . Back pain   . Chronic pain syndrome    Axis IV: other psychosocial or environmental problems, problems related to social environment and problems with primary support group Axis V: 41-50 serious symptoms  ADL's:  Intact  Sleep: Poor  Appetite:  Good  Suicidal Ideation:  Denies Homicidal Ideation:  Denies  Psychiatric Specialty Exam: Review of Systems  Constitutional: Negative.   HENT: Negative.   Eyes: Negative.   Respiratory: Negative.   Cardiovascular: Negative.   Gastrointestinal: Negative.   Genitourinary: Negative.   Musculoskeletal: Positive for back pain.  Skin: Negative.   Neurological: Negative.   Endo/Heme/Allergies: Negative.   Psychiatric/Behavioral: Positive for depression. The patient is nervous/anxious.     Blood pressure 93/57, pulse 105, temperature 98.3 F (36.8 C), temperature source Oral, resp. rate 18, last menstrual period  05/31/2013.There is no weight on file to calculate BMI.  General Appearance: Casual  Eye Contact::  Fair  Speech:  Normal Rate  Volume:  Normal  Mood:  Anxious  Affect:  Congruent  Thought Process:  Coherent  Orientation:  Full (Time, Place, and Person)  Thought Content:  WDL  Suicidal Thoughts:  No  Homicidal Thoughts:  No  Memory:  Immediate;   Fair Recent;   Fair Remote;   Fair  Judgement:  Poor  Insight:  Lacking  Psychomotor Activity:  Decreased  Concentration:  Fair  Recall:  Fair  Akathisia:  No  Handed:  Right  AIMS (if indicated):     Assets:  Communication Skills Physical Health Resilience Social Support  Sleep:  Number of Hours: 6.75   Current Medications: Current Facility-Administered Medications  Medication Dose Route Frequency Provider Last Rate Last Dose  . acetaminophen (TYLENOL) tablet 650 mg  650 mg Oral Q6H PRN Court Joy, PA-C      . alum & mag hydroxide-simeth (MAALOX/MYLANTA) 200-200-20 MG/5ML suspension 30 mL  30 mL Oral Q4H PRN Court Joy, PA-C      . ARIPiprazole (ABILIFY) tablet 5 mg  5 mg Oral Daily Nanine Means, NP   5 mg at 06/11/13 0818  . gabapentin (NEURONTIN) capsule 1,200 mg  1,200 mg Oral QHS Court Joy, PA-C   1,200 mg at 06/10/13 2142  . gabapentin (NEURONTIN) capsule 600 mg  600 mg Oral BID WC Nanine Means, NP   600 mg at 06/11/13 1327  . lamoTRIgine (LAMICTAL) tablet 200 mg  200 mg Oral Daily Court Joy, PA-C   200 mg at 06/11/13 1914  . magnesium hydroxide (MILK OF MAGNESIA) suspension 30 mL  30 mL Oral Daily PRN Court Joy, PA-C      . methocarbamol (ROBAXIN) tablet 500 mg  500 mg Oral QID Nanine Means, NP   500 mg at 06/11/13 1151  . nicotine (NICODERM CQ - dosed in mg/24 hours) patch 21 mg  21 mg Transdermal Daily Nehemiah Settle, MD   21 mg at 06/11/13 0651  . ondansetron (ZOFRAN) tablet 4 mg  4 mg Oral Q8H PRN Jorje Guild, PA-C   4 mg at 06/09/13 2159  . sertraline (ZOLOFT) tablet 200 mg  200 mg  Oral Daily Nanine Means, NP   200 mg at 06/11/13 0814  . traZODone (DESYREL) tablet 100 mg  100 mg Oral QHS,MR X 1 Nanine Means, NP   100 mg at 06/11/13 0002    Lab Results:  No results found for this or any previous visit (from the past 48 hour(s)).  Physical Findings: AIMS: Facial and Oral Movements Muscles of Facial Expression: None, normal Lips and Perioral Area: None, normal Jaw: None, normal Tongue: None, normal,Extremity Movements Upper (arms, wrists, hands, fingers): None, normal Lower (legs, knees, ankles, toes): None, normal, Trunk Movements Neck, shoulders, hips: None, normal, Overall Severity Severity of abnormal movements (highest score from questions above): None, normal Incapacitation due to abnormal movements: None, normal Patient's awareness of abnormal movements (rate only patient's report): No Awareness, Dental Status Current problems with teeth and/or dentures?: No Does patient usually wear dentures?: No  CIWA:  CIWA-Ar Total: 2 COWS:     Treatment Plan Summary: Daily contact with patient to assess and evaluate symptoms and progress in treatment Medication management  Plan:   Will discuss with treatment team, case manager and patient family regarding her disposition plans and court date.  Review of chart, vital signs, medications, and notes. 1-Individual and group therapy 2-Medication management for depression and anxiety:  Medications reviewed with the patient and no untoward effects, no changes made 3-Coping skills for depression, anxiety, and alcohol abuse 4-Continue crisis stabilization and management 5-Address health issues--monitoring vital signs, stable 6-Treatment plan in progress to prevent relapse of depression, alcohol abuse, and anxiety  Medical Decision Making Problem Points:  Established problem, stable/improving (1) and Review of psycho-social stressors (1) Data Points:  Review of medication regiment & side effects (2)  I certify that  inpatient services furnished can reasonably be expected to improve the patient's condition.   Nehemiah Settle., MD  06/11/2013, 1:41 PM

## 2013-06-11 NOTE — Progress Notes (Addendum)
Patient ID: [name], female   DOB: 10/03/1981, 32 y.o.   MRN: [id number] D)  Has been out to the dayroom for brief periods, attended group, also lying down at intervals.  States nothing is helping her back, states needs surgery, states family hx of DDD.   Affect is flat, sad, states heat packs don't help, nothing relieves the pain completely.  Discussed ways she may be able to help her back, including developing core strength and losing weight, strength training, inversion tables.  Didn't seem interested, focused on surgery and what meds she may have.  Had hs meds, went back to her room.  Denies thoughts of self harm at this time. A)  Will continue to monitor for safety, continue POC R)Safety maintained.   Addendum:  Came back to nsg station shortly after midnight, was drinking a Pepsi and saying she couldn't sleep.  Was reminded Pepsi has caffeine and gave repeat trazodone, although she said it wouldn't work.  Went back to bed.

## 2013-06-11 NOTE — Progress Notes (Signed)
Patient ID: [name], female   DOB: 1981/01/29, 32 y.o.   MRN: [id number] D-Patient reports she slept fair and that her appetite is good.  She rates her depression at 2/10 and her anxiety at 0.  She requested a tray on unit at lunch because her back was having spasms at that time.  A- Supported patient.  R-Patient resting in bed this afternoon.  Declined hot or cold fpack as she does not find them helpful.  No visitors this pm.

## 2013-06-11 NOTE — Progress Notes (Signed)
Adult Psychoeducational Group Note  Date:  06/11/2013 Time:  11:27 AM  Group Topic/Focus:  Self Care:   The focus of this group is to help patients understand the importance of self-care in order to improve or restore emotional, physical, spiritual, interpersonal, and financial health.  Participation Level:  Active  Participation Quality:  Appropriate  Affect:  Appropriate  Cognitive:  Alert and Appropriate  Insight: Appropriate  Engagement in Group:  Engaged  Modes of Intervention:  Discussion and Education  Additional Comments:  Pt attended and participated in group.When ask what self care meant to her pt stated putting your emotional needs first.  Pryor Curia 06/11/2013, 11:27 AM
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
## 2013-06-12 MED ORDER — CELECOXIB 100 MG PO CAPS
200.0000 mg | ORAL_CAPSULE | Freq: Every day | ORAL | Status: DC
  Administered 2013-06-12 – 2013-06-13 (×2): 200 mg via ORAL
  Filled 2013-06-12 (×3): qty 2

## 2013-06-12 MED ORDER — ARIPIPRAZOLE ER 400 MG IM SUSR
400.0000 mg | INTRAMUSCULAR | Status: DC
  Administered 2013-06-12: 400 mg via INTRAMUSCULAR

## 2013-06-12 MED ORDER — NON FORMULARY: 400.0000 mg | Freq: Once | Status: DC

## 2013-06-12 NOTE — Progress Notes (Signed)
Patient ID: [name], female   DOB: November 04, 1980, 32 y.o.   MRN: [id number] [facility] MD Progress Note  06/12/2013 10:19 AM [name]  MRN:  [id number]  Subjective: Patient is well known to this writer from out patient practice. She states she is feeling wonderful today and that the Abilify is helping. She reports being off of her Zoloft for 1 week when the symptoms began.She also states her back hurts and this is a problem for her as well. She states her pain is a 10/10 while we are speaking. However she appears comfortable and in no distress. She rates her depression as 0/10 and her anxiety as a 0/10.  Diagnosis:  Depressive Disorders: Major Depressive Disorder - Severe (296.23)  DSM5: Depressive Disorders:  Major Depressive Disorder - Severe (296.23) Axis I: Anxiety Disorder NOS and Major Depression, Recurrent severe Axis II: Deferred Axis III:  Past Medical History  Diagnosis Date  . Gallbladder problem   . Anxiety   . Obsessive compulsive disorder   . Depression   . Back pain   . Chronic pain syndrome    Axis IV: other psychosocial or environmental problems, problems related to social environment and problems with primary support group Axis V: 41-50 serious symptoms  ADL's:  Intact  Sleep: Poor  Appetite:  Good  Suicidal Ideation:  Denies Homicidal Ideation:  Denies  Psychiatric Specialty Exam: Review of Systems  Constitutional: Negative.   HENT: Negative.   Eyes: Negative.   Respiratory: Negative.   Cardiovascular: Negative.   Gastrointestinal: Negative.   Genitourinary: Negative.   Musculoskeletal: Positive for back pain.  Skin: Negative.   Neurological: Negative.   Endo/Heme/Allergies: Negative.   Psychiatric/Behavioral: Positive for depression. The patient is nervous/anxious.     Blood pressure 112/78, pulse 99, temperature 97 F (36.1 C), temperature source Oral, resp. rate 18, last menstrual period 05/31/2013.There is no weight on  file to calculate BMI.  General Appearance: Casual  Eye Contact::  Fair  Speech:  Normal Rate  Volume:  Normal  Mood:  Anxious  Affect:  Congruent  Thought Process:  Coherent  Orientation:  Full (Time, Place, and Person)  Thought Content:  WDL  Suicidal Thoughts:  No  Homicidal Thoughts:  No  Memory:  Immediate;   Fair Recent;   Fair Remote;   Fair  Judgement:  Poor  Insight:  Lacking  Psychomotor Activity:  Decreased  Concentration:  Fair  Recall:  Fair  Akathisia:  No  Handed:  Right  AIMS (if indicated):     Assets:  Communication Skills Physical Health Resilience Social Support  Sleep:  Number of Hours: 6.75   Current Medications: Current Facility-Administered Medications  Medication Dose Route Frequency Provider Last Rate Last Dose  . acetaminophen (TYLENOL) tablet 650 mg  650 mg Oral Q6H PRN Court Joy, PA-C   650 mg at 06/12/13 5784  . alum & mag hydroxide-simeth (MAALOX/MYLANTA) 200-200-20 MG/5ML suspension 30 mL  30 mL Oral Q4H PRN Court Joy, PA-C      . ARIPiprazole (ABILIFY) tablet 5 mg  5 mg Oral Daily Nanine Means, NP   5 mg at 06/12/13 0802  . gabapentin (NEURONTIN) capsule 1,200 mg  1,200 mg Oral QHS Court Joy, PA-C   1,200 mg at 06/11/13 2138  . gabapentin (NEURONTIN) capsule 600 mg  600 mg Oral BID WC Nanine Means, NP   600 mg at 06/12/13 0803  . lamoTRIgine (LAMICTAL) tablet 200 mg  200 mg Oral  Daily Court Joy, PA-C   200 mg at 06/12/13 0802  . magnesium hydroxide (MILK OF MAGNESIA) suspension 30 mL  30 mL Oral Daily PRN Court Joy, PA-C      . nicotine (NICODERM CQ - dosed in mg/24 hours) patch 21 mg  21 mg Transdermal Daily Nehemiah Settle, MD   21 mg at 06/12/13 0654  . ondansetron (ZOFRAN) tablet 4 mg  4 mg Oral Q8H PRN Jorje Guild, PA-C   4 mg at 06/09/13 2159  . sertraline (ZOLOFT) tablet 200 mg  200 mg Oral Daily Nanine Means, NP   200 mg at 06/12/13 0803  . traZODone (DESYREL) tablet 100 mg  100 mg Oral QHS,MR X 1  Nanine Means, NP   100 mg at 06/12/13 0020    Lab Results:  No results found for this or any previous visit (from the past 48 hour(s)).  Physical Findings: The patient has normal gait, can stand and walk on toes, and heels, can bear weight on each foot independently with good balance. Back is straight with hormal lordotic curvature. Mild pain to palpation at L4-5 with no paraspinal spasm or knots. Good forward flexion to toes bending forward, good lateral side to side turning. AIMS: Facial and Oral Movements Muscles of Facial Expression: None, normal Lips and Perioral Area: None, normal Jaw: None, normal Tongue: None, normal,Extremity Movements Upper (arms, wrists, hands, fingers): None, normal Lower (legs, knees, ankles, toes): None, normal, Trunk Movements Neck, shoulders, hips: None, normal, Overall Severity Severity of abnormal movements (highest score from questions above): None, normal Incapacitation due to abnormal movements: None, normal Patient's awareness of abnormal movements (rate only patient's report): No Awareness, Dental Status Current problems with teeth and/or dentures?: No Does patient usually wear dentures?: No  CIWA:  CIWA-Ar Total: 2 COWS:     Treatment Plan Summary: Daily contact with patient to assess and evaluate symptoms and progress in treatment Medication management  Plan:   Will discuss with treatment team, case manager and patient family regarding her disposition plans and court date.  Review of chart, vital signs, medications, and notes. 1-Individual and group therapy 2-Medication management for depression and anxiety:  Medications reviewed with the patient and no untoward effects, no changes made 3-Coping skills for depression, anxiety, and alcohol abuse 4-Continue crisis stabilization and management 5-Address health issues--monitoring vital signs, stable 6-Treatment plan in progress to prevent relapse of depression, alcohol abuse, and anxiety 7.  D/C'd Robaxin. 8. Will initiate Celebrex 200mg  po qd for joint pain. 9. Will initiate Abilify Maintena for compliance. 10. Will continue other medication as ordered. Medical Decision Making Problem Points:  Established problem, stable/improving (1), New problem, with no additional work-up planned (3) and Review of psycho-social stressors (1) Data Points:  Review or order medicine tests (1) Review of medication regiment & side effects (2)  I certify that inpatient services furnished can reasonably be expected to improve the patient's condition.   Rona Ravens. [place] 06/12/2013 2:22 PM  Reviewed the information documented and agree with the treatment plan.  Talesha Ellithorpe,JANARDHAHA R. 06/12/2013 2:37 PM

## 2013-06-12 NOTE — Progress Notes (Signed)
Patient ID: [name], female   DOB: Jan 26, 1981, 32 y.o.   MRN: [id number] D- Patient says she slept fair and her appetite is good.  Her energy level is high and she denies feeling depressed or hopelessness.  She denies any thoughts of self harm.  She continues to have back pain and says she doesn't expect to get relief until she has surgery.  A- encouraged patient to attend group, as she and roommate were lying in bed at time for grief /loss group.  R- Patient did attend.  She says she plans to try to be more open with her doctors and family.

## 2013-06-12 NOTE — Progress Notes (Signed)
The focus of this group is to educate the patient on the purpose and policies of crisis stabilization and provide a format to answer questions about their admission.  The group details unit policies and expectations of patients while admitted.  Patient attended 0900 nurse education orientation group.  Patient actively participated, appropriate affect, alert, appropriate insight and engagement.  Patient will work on goals for discharge today.  

## 2013-06-12 NOTE — Progress Notes (Signed)
Pt. attended spirituality group focusing on topic of grief and loss facilitated by chaplain.  Group facilitated awareness around loss in pt's lives. Members normalized and explored loss in relationships with self and others, explored how history and family expectations shape our expectations of we walk with grief.  As group members responded to one another, facilitator assisted group in drawing out themes in their grief work.    [name] was attentive throughout group, but did not contribute to discussion.   Belva Crome MDiv

## 2013-06-12 NOTE — Progress Notes (Signed)
D: Pt is flat in affect and anxious in mood. Pt reports her Abilify as effective. Pt is reporting unsatisfactory relief of pain with current regimen. Writer offer heat packs and tylenol between her scheduled robaxin for lower back pain. This pt is currently denying any SI/HI. Pt attended group this evening. Pt observed interacting appropriately within the milieu.  A: Writer administered scheduled and prn medications to pt. Continued support and availability as needed was extended to this pt. Staff continue to monitor pt with q46min checks.  R: No adverse drug reactions noted. Pt receptive to treatment. Pt remains safe at this time.

## 2013-06-12 NOTE — B[facility] Group Notes (Signed)
[facility] LCSW Group Therapy      Feelings About Diagnosis 1:15 - 2:30 PM         06/12/2013    Type of Therapy:  Group Therapy  Participation Level:  Active  Participation Quality:  Appropriate  Affect:  Appropriate  Cognitive:  Alert and Appropriate  Insight:  Developing/Improving and Engaged  Engagement in Therapy:  Developing/Improving and Engaged  Modes of Intervention:  Discussion, Education, Exploration, Problem-Solving, Rapport Building, Support  Summary of Progress/Problems:  Patient actively participated in group. Patient discussed past and present diagnosis and the effects it has had on  life.  Patient talked about family and society being judgmental and the stigma associated with having a mental health diagnosis. She shared she has no problem accepting her illness but other have problems with it.  Wynn Banker 06/12/2013

## 2013-06-12 NOTE — Progress Notes (Signed)
Adult Psychoeducational Group Note  Date:  06/12/2013 Time:  10:26 PM  Group Topic/Focus:  Healthy Communication:   The focus of this group is to discuss communication, barriers to communication, as well as healthy ways to communicate with others.  Participation Level:  Did Not Attend  Participation Quality:  did not attend  Affect:  did not attend  Cognitive:  did not attend  Insight: None  Engagement in Group:  did not attend  Modes of Intervention:  did not attend  Additional Comments:  Did not attend   Annell Greening Casina 06/12/2013, 10:26 PM

## 2013-06-12 NOTE — Progress Notes (Signed)
Adult Psychoeducational Group Note  Date:  06/12/2013 Time:  11:00am Group Topic/Focus:  Recovery Goals:   The focus of this group is to identify appropriate goals for recovery and establish a plan to achieve them.  Participation Level:  Active  Participation Quality:  Appropriate and Attentive  Affect:  Appropriate  Cognitive:  Alert and Appropriate  Insight: Appropriate  Engagement in Group:  Engaged  Modes of Intervention:  Discussion and Education  Additional Comments:  Pt attended and participated in group. When ask what recovery means to her pt stated dealing with problems you have and accepting them.  Shelly Bombard D 06/12/2013, 2:18 PM

## 2013-06-13 DIAGNOSIS — F1010 Alcohol abuse, uncomplicated: Secondary | ICD-10-CM

## 2013-06-13 DIAGNOSIS — F332 Major depressive disorder, recurrent severe without psychotic features: Secondary | ICD-10-CM

## 2013-06-13 DIAGNOSIS — F411 Generalized anxiety disorder: Secondary | ICD-10-CM

## 2013-06-13 MED ORDER — ARIPIPRAZOLE ER 400 MG IM SUSR: 400.0000 mg | INTRAMUSCULAR | Status: DC

## 2013-06-13 MED ORDER — GABAPENTIN 600 MG PO TABS
600.0000 mg | ORAL_TABLET | Freq: Two times a day (BID) | ORAL | Status: DC
  Filled 2013-06-13: qty 42

## 2013-06-13 MED ORDER — ARIPIPRAZOLE 5 MG PO TABS: 5.0000 mg | ORAL_TABLET | Freq: Every day | ORAL | Status: DC

## 2013-06-13 MED ORDER — SERTRALINE HCL 100 MG PO TABS: 200.0000 mg | ORAL_TABLET | Freq: Every day | ORAL | Status: AC

## 2013-06-13 MED ORDER — LAMOTRIGINE 200 MG PO TABS: 200.0000 mg | ORAL_TABLET | Freq: Every day | ORAL | Status: DC

## 2013-06-13 MED ORDER — GABAPENTIN 300 MG PO CAPS: 600.0000 mg | ORAL_CAPSULE | Freq: Two times a day (BID) | ORAL | Status: DC

## 2013-06-13 MED ORDER — TRAZODONE HCL 100 MG PO TABS: 100.0000 mg | ORAL_TABLET | Freq: Every day | ORAL | Status: DC

## 2013-06-13 MED ORDER — GABAPENTIN 400 MG PO CAPS: 1200.0000 mg | ORAL_CAPSULE | Freq: Every day | ORAL | Status: DC

## 2013-06-13 MED ORDER — TRAZODONE HCL 100 MG PO TABS
100.0000 mg | ORAL_TABLET | Freq: Every day | ORAL | Status: DC
  Filled 2013-06-13: qty 14

## 2013-06-13 NOTE — B[facility] Suicide Risk Assessment (Signed)
Suicide Risk Assessment  Discharge Assessment     Demographic Factors:  Adolescent or young adult, Caucasian, Low socioeconomic status and Unemployed  Mental Status Per Nursing Assessment::   On Admission:  NA  Current Mental Status by Physician: Mental Status Examination: Patient appeared as per his stated age, casually dressed, and fairly groomed, and maintaining good eye contact. Patient has good mood and his affect was constricted. He has normal rate, rhythm, and volume of speech. His thought process is linear and goal directed. Patient has denied suicidal, homicidal ideations, intentions or plans. Patient has no evidence of auditory or visual hallucinations, delusions, and paranoia. Patient has fair insight judgment and impulse control.  Loss Factors: Decrease in vocational status and Financial problems/change in socioeconomic status  Historical Factors: Prior suicide attempts, Family history of mental illness or substance abuse and Impulsivity  Risk Reduction Factors:   Sense of responsibility to family, Religious beliefs about death, Living with another person, especially a relative, Positive social support, Positive therapeutic relationship and Positive coping skills or problem solving skills  Continued Clinical Symptoms:  Bipolar Disorder:   Mixed State Alcohol/Substance Abuse/Dependencies Previous Psychiatric Diagnoses and Treatments Medical Diagnoses and Treatments/Surgeries  Cognitive Features That Contribute To Risk:  Polarized thinking    Suicide Risk:  Minimal: No identifiable suicidal ideation.  Patients presenting with no risk factors but with morbid ruminations; may be classified as minimal risk based on the severity of the depressive symptoms  Discharge Diagnoses:   AXIS I:  Bipolar, mixed AXIS II:  Deferred AXIS III:   Past Medical History  Diagnosis Date  . Gallbladder problem   . Anxiety   . Obsessive compulsive disorder   . Depression   . Back pain    . Chronic pain syndrome    AXIS IV:  economic problems, occupational problems, other psychosocial or environmental problems, problems related to social environment and problems with primary support group AXIS V:  61-70 mild symptoms  Plan Of Care/Follow-up recommendations:  Activity:  As tolerated Diet:  Regular  Is patient on multiple antipsychotic therapies at discharge:  No   Has Patient had three or more failed trials of antipsychotic monotherapy by history:  No  Recommended Plan for Multiple Antipsychotic Therapies: NA  Kolby Myung,JANARDHAHA R. 06/13/2013, 12:49 PM

## 2013-06-13 NOTE — Tx Team (Addendum)
Interdisciplinary Treatment Plan Update   Date Reviewed:  06/13/2013  Time Reviewed:  9:33 AM  Progress in Treatment:   Attending groups: Yes Participating in groups: Yes Taking medication as prescribed: Yes Tolerating medication: Yes, c Family/Significant other contact made: Yes, contact made with husband. Patient understands diagnosis: Yes  Discussing patient identified problems/goals with staff: Yes Medical problems stabilized or resolved: Yes Denies suicidal/homicidal ideation: Yes Patient has not harmed self or others: Yes  For review of initial/current patient goals, please see plan of care.  Estimated Length of Stay: Discharge todayReasons for Continued Hospitalization:   New Problems/Goals identified:    Discharge Plan or Barriers:   Home with outpatient follow up at [facility]  Additional Comments: N/A  Attendees:  Patient:   [name] 06/13/2013  9:33 AM  Signature: Mervyn Gay, MD 06/13/2013 9:33 AM  Signature:  Verne Spurr, PA 06/13/2013 9:33 AM  Signature: Neill Loft, RN 06/13/2013 9:34 AM  Signature:  Chinita Greenland, RN  9:29 AM 9:34 AM  Signature:  Leighton Parody, RN 06/13/2013 9:34 AM  Signature:  Juline Patch, LCSW 06/13/2013 9:34 AM  Signature:  Reyes Ivan, LCSW 06/13/2013 9:34 AM  Signature:  06/13/2013 9:34 AM  Signature:  06/13/2013 .9:34 AM  Signature:  06/13/2013 9:34 AM  Signature:    Signature:      Scribe for Treatment Team:   Juline Patch,  06/13/2013 9:29 AM]

## 2013-06-13 NOTE — BHH Suicide Risk Assessment (Signed)
B[facility] INPATIENT:  Family/Significant Other Suicide Prevention Education  Suicide Prevention Education:  Contact Attempts: Bessie Boyte, Husband, [phone]; has been identified by the patient as the family member/significant other with whom the patient will be residing, and identified as the person(s) who will aid the patient in the event of a mental health crisis.  With written consent from the patient, two attempts were made to provide suicide prevention education, prior to and/or following the patient's discharge.  We were unsuccessful in providing suicide prevention education.  A suicide education pamphlet was given to the patient to share with family/significant other.  Date and time of first attempt: 06/13/13 at 10:55 AM Date and time of second attempt: 06/13/13 at 12:30 PM  Derran Sear, Joesph July 06/13/2013, 10:59 AM

## 2013-06-13 NOTE — B[facility] Group Notes (Signed)
[facility] LCSW Aftercare Discharge Planning Group Note   06/13/2013 8:28 AM    Participation Quality:  Appropraite  Mood/Affect:  Appropriate  Depression Rating:  0  Anxiety Rating:  0  Thoughts of Suicide:  No  Will you contract for safety?   NA  Current AVH:  No  Plan for Discharge/Comments:  Patient attending discharge planning group and actively participated in group.  She reports being ready to discharge home today and will follow up with [facility] for outpatient services.  Patient reports she is doing well and will follow up with [facility].  CSW provided all participants with daily workbook and information on services offered by [facility].   Transportation Means: Patient has transportation.   Supports:  Patient has a support system.   [name], Joesph July

## 2013-06-13 NOTE — [facility] Group Notes (Signed)
[facility] LCSW Group Therapy  Emotional Regulation 1:15 - 2: 30 PM        06/13/2013     Type of Therapy:  Group Therapy  Participation Level:  Appropriate  Participation Quality:  Appropriate  Affect:  Appropriate  Cognitive:  Attentive Appropriate  Insight:  Engaged  Engagement in Therapy:  Engaged  Modes of Intervention:  Discussion Exploration Problem-Solving Supportive  Summary of Progress/Problems:  Group topic was emotional regulations.  Patient participated in the discussion and was able to identify an emotion that needed to regulated.  Patient was able to identify approprite coping skills.  Aram Domzalski Hairston 06/13/2013        

## 2013-06-13 NOTE — Progress Notes (Signed)
Pt is very excited about being discharged. She stated her car is parked across the street and she plans to give her roommate a ride home. Pt stated she speaks to her son daily as he is 32 years old. She is unsure when the next child custody court date will be. Pt denies feeling Si or HI and does contract for safety.Pt stated she is upset that her husband would take out a restraining order on her. She appears very bright and happy. Depression is a o1/0 and hopelessness is a 0/10

## 2013-06-13 NOTE — Progress Notes (Signed)
[facility] Adult Case Management Discharge Plan :  Will you be returning to the same living situation after discharge: Yes,  Patient is returning to her home. At discharge, do you have transportation home?:Yes,  Patient will arrange transportation home. Do you have the ability to pay for your medications:No. Patient needs assistance with indigent medications   Release of information consent forms completed and in the chart;  Patient's signature needed at discharge.  Patient to Follow up at: Follow-up Information   Follow up with [facility] On 06/14/2013. (Please go to [facility]'s walk in clinic on Thursday, June 14, 2013 or any weekday between 8AM - 3PM for medication managment)    Contact information:   [address] [place], [place]   [address]   [phone]      Patient denies SI/HI:   Patient no longer endorsing SI/HI or other thoughts of self harm.     Safety Planning and Suicide Prevention discussed: .Reviewed with all patients during discharge planning group   [name], Joesph July 06/13/2013, 10:44 AM

## 2013-06-13 NOTE — Discharge Summary (Signed)
Physician Discharge Summary Note  Patient:  [name] is an 32 y.o., female MRN:  [id number] DOB:  Nov 10, 1980 Patient phone:  [phone] (home)  Patient address:   [address] [place] [address],   Date of Admission:  06/09/2013 Date of Discharge: 06/13/2013   Reason for Admission:  Major depression with suicidal ideation  Discharge Diagnoses: Principal Problem:   Major depressive disorder, recurrent episode, severe, specified as with psychotic behavior Active Problems:   Benzodiazepine-based tranquilizers causing adverse effect in therapeutic use   Depression with suicidal ideation   Alcohol abuse, episodic drinking behavior   Bipolar I disorder, most recent episode depressed  ROS  DSM5:  Depressive Disorders: Major Depressive Disorder - Severe (296.23)  AXIS I: Alcohol Abuse, Anxiety Disorder NOS and Major Depression, Recurrent severe  AXIS II: Deferred  AXIS III:  Past Medical History   Diagnosis  Date   .  Gallbladder problem    .  Anxiety    .  Obsessive compulsive disorder    .  Depression    .  Back pain    .  Chronic pain syndrome     AXIS IV: other psychosocial or environmental problems, problems related to social environment and problems with primary support group  AXIS V: 41-50 serious symptoms  Level of Care:  OP  Hospital Course:  [name] was admitted after presenting to ED reporting increased anxiety and depression. She was extremely agitated and tachycardic. After further evaluation she was felt to be in need of acute psychiatric hospitalization.       [name] was admitted to [facility] and her symptoms were identified and medication management was initiated. She noted that her symptoms increased when she ran out of her Zoloft. She does not work and is a single income family, so money is an issue for her. [name] was encouraged to participate in unit programming and groups.      Immediately [name] began petitioning for release from the unit,  and did not seem interested in treatment. However after speaking with her at length she was more motivated to consider restarting her medication. She also did discuss with this provider her desire to return to the work force since her 3 year old son is now in school. She stated that she is over protective and has wanted to stay at home with him as long as possible.     By the day of discharge [name] was in much improved condition and was motivated to continue her medication and follow up on an out patient basis. She denied SI/HI, or AVH. She noted that her anxiety had decreased significantly.  She was discharged home with plans to follow up as noted below.  Consults:  None  Significant Diagnostic Studies:  None  Discharge Vitals:   Blood pressure 111/78, pulse 120, temperature 98.8 F (37.1 C), temperature source Oral, resp. rate 16, last menstrual period 05/31/2013. There is no weight on file to calculate BMI. Lab Results:   No results found for this or any previous visit (from the past 72 hour(s)).  Physical Findings: AIMS: Facial and Oral Movements Muscles of Facial Expression: None, normal Lips and Perioral Area: None, normal Jaw: None, normal Tongue: None, normal,Extremity Movements Upper (arms, wrists, hands, fingers): None, normal Lower (legs, knees, ankles, toes): None, normal, Trunk Movements Neck, shoulders, hips: None, normal, Overall Severity Severity of abnormal movements (highest score from questions above): None, normal Incapacitation due to abnormal movements: None, normal Patient's awareness of abnormal  movements (rate only patient's report): No Awareness, Dental Status Current problems with teeth and/or dentures?: No Does patient usually wear dentures?: No  CIWA:  CIWA-Ar Total: 2 COWS:     Psychiatric Specialty Exam: See Psychiatric Specialty Exam and Suicide Risk Assessment completed by Attending Physician prior to discharge.  Discharge destination:  Home  Is  patient on multiple antipsychotic therapies at discharge:  No   Has Patient had three or more failed trials of antipsychotic monotherapy by history:  No  Recommended Plan for Multiple Antipsychotic Therapies: NA  Discharge Orders   Future Orders Complete By Expires   Diet - low sodium heart healthy  As directed    Discharge instructions  As directed    Comments:     Take all of your medications as directed. Be sure to keep all of your follow up appointments.  If you are unable to keep your follow up appointment, call your Doctor's office to let them know, and reschedule.  Make sure that you have enough medication to last until your appointment. Be sure to get plenty of rest. Going to bed at the same time each night will help. Try to avoid sleeping during the day.  Increase your activity as tolerated. Regular exercise will help you to sleep better and improve your mental health. Eating a heart healthy diet is recommended. Try to avoid salty or fried foods. Be sure to avoid all alcohol and illegal drugs.   Increase activity slowly  As directed        Medication List       Indication   ARIPiprazole 5 MG tablet  Commonly known as:  ABILIFY  Take 1 tablet (5 mg total) by mouth daily. For major depressive disorder.   Indication:  Manic-Depression, Major Depressive Disorder     ARIPiprazole 400 MG Susr  Commonly known as:  ABILIFY MAINTENA  Inject 400 mg into the muscle every 28 (twenty-eight) days. First dose given on 06/12/2013. For adjunctive therapy for major depressive disorder.   Indication:  Adjunctive therapy to major depressive disorder.     gabapentin 300 MG capsule  Commonly known as:  NEURONTIN  Take 2 capsules (600 mg total) by mouth 2 (two) times daily with a meal. For neuropathic pain, anxiety and mood stabilization.   Indication:  Agitation, mood stabilization and anxiety     lamoTRIgine 200 MG tablet  Commonly known as:  LAMICTAL  Take 1 tablet (200 mg total) by mouth  daily. For depression and mood stabilization.   Indication:  Depression     sertraline 100 MG tablet  Commonly known as:  ZOLOFT  Take 2 tablets (200 mg total) by mouth daily. For depression and anxiety.   Indication:  Major Depressive Disorder     traZODone 100 MG tablet  Commonly known as:  DESYREL  Take 1 tablet (100 mg total) by mouth at bedtime. For insomnia.   Indication:  Insomnia           Follow-up Information   Follow up with [facility] On 06/14/2013. (Please go to [facility]'s walk in clinic on Thursday, June 14, 2013 or any weekday between 8AM - 3PM for medication managment)    Contact information:   [address] [place], [place]   [address]   [phone]      Follow-up recommendations:   Activities: Resume activity as tolerated. Diet: Heart healthy low sodium diet Tests: Follow up testing will be determined by your out patient provider.  Comments:  [name] would  benefit greatly from returning to school. She is a very nice person with many gifts and graces that further education could help her to use in a meaningful career.  Total Discharge Time:  Greater than 30 minutes.  Signed: Rona Ravens. Mashburn RPAC 11:17 AM 06/13/2013   Patient was examined personally for suicide risk assessment, case discussed with the treatment team and made discharge plan. Reviewed the information documented and agree with the treatment plan.  Hayward Rylander,JANARDHAHA R. 06/19/2013 3:05 PM

## 2013-06-18 NOTE — Progress Notes (Signed)
Patient Discharge Instructions:  After Visit Summary (AVS):   Faxed to:  06/18/13 Psychiatric Admission Assessment Note:   Faxed to:  06/18/13 Suicide Risk Assessment - Discharge Assessment:   Faxed to:  06/18/13 Faxed/Sent to the Next Level Care provider:  06/18/13 Faxed to [facility] @ [phone]  Jerelene Redden, 06/18/2013, 4:04 PM

## 2014-02-06 ENCOUNTER — Encounter (HOSPITAL_COMMUNITY): Payer: Self-pay | Admitting: Emergency Medicine

## 2014-02-06 ENCOUNTER — Emergency Department (HOSPITAL_COMMUNITY): Payer: Self-pay

## 2014-02-06 DIAGNOSIS — R609 Edema, unspecified: Secondary | ICD-10-CM | POA: Insufficient documentation

## 2014-02-06 DIAGNOSIS — M549 Dorsalgia, unspecified: Secondary | ICD-10-CM | POA: Insufficient documentation

## 2014-02-06 DIAGNOSIS — Z91018 Allergy to other foods: Secondary | ICD-10-CM | POA: Insufficient documentation

## 2014-02-06 DIAGNOSIS — M79609 Pain in unspecified limb: Secondary | ICD-10-CM | POA: Insufficient documentation

## 2014-02-06 DIAGNOSIS — R0602 Shortness of breath: Secondary | ICD-10-CM | POA: Insufficient documentation

## 2014-02-06 DIAGNOSIS — F17200 Nicotine dependence, unspecified, uncomplicated: Secondary | ICD-10-CM | POA: Insufficient documentation

## 2014-02-06 DIAGNOSIS — Z888 Allergy status to other drugs, medicaments and biological substances status: Secondary | ICD-10-CM | POA: Insufficient documentation

## 2014-02-06 DIAGNOSIS — F329 Major depressive disorder, single episode, unspecified: Secondary | ICD-10-CM | POA: Insufficient documentation

## 2014-02-06 DIAGNOSIS — F3289 Other specified depressive episodes: Secondary | ICD-10-CM | POA: Insufficient documentation

## 2014-02-06 DIAGNOSIS — F411 Generalized anxiety disorder: Secondary | ICD-10-CM | POA: Insufficient documentation

## 2014-02-06 DIAGNOSIS — G894 Chronic pain syndrome: Secondary | ICD-10-CM | POA: Insufficient documentation

## 2014-02-06 DIAGNOSIS — Z79899 Other long term (current) drug therapy: Secondary | ICD-10-CM | POA: Insufficient documentation

## 2014-02-06 DIAGNOSIS — F429 Obsessive-compulsive disorder, unspecified: Secondary | ICD-10-CM | POA: Insufficient documentation

## 2014-02-06 LAB — COMPREHENSIVE METABOLIC PANEL
ALT: 17 U/L (ref 0–35)
AST: 18 U/L (ref 0–37)
Albumin: 4.2 g/dL (ref 3.5–5.2)
Alkaline Phosphatase: 80 U/L (ref 39–117)
BUN: 7 mg/dL (ref 6–23)
CALCIUM: 10.2 mg/dL (ref 8.4–10.5)
CHLORIDE: 104 meq/L (ref 96–112)
CO2: 23 meq/L (ref 19–32)
CREATININE: 0.64 mg/dL (ref 0.50–1.10)
GFR calc Af Amer: >90 mL/min (ref >90)
Glucose, Bld: 102 mg/dL — ABNORMAL HIGH (ref 70–99)
Potassium: 4 mEq/L (ref 3.7–5.3)
Sodium: 144 mEq/L (ref 137–147)
Total Protein: 7.7 g/dL (ref 6.0–8.3)

## 2014-02-06 LAB — CBC WITH DIFFERENTIAL/PLATELET
BASOS ABS: 0 10*3/uL (ref 0.0–0.1)
BASOS PCT: 0 % (ref 0–1)
EOS ABS: 0.2 10*3/uL (ref 0.0–0.7)
EOS PCT: 1 % (ref 0–5)
HEMATOCRIT: 40.9 % (ref 36.0–46.0)
Hemoglobin: 14.6 g/dL (ref 12.0–15.0)
Lymphocytes Relative: 27 % (ref 12–46)
Lymphs Abs: 3.3 10*3/uL (ref 0.7–4.0)
MCH: 32.7 pg (ref 26.0–34.0)
MCHC: 35.7 g/dL (ref 30.0–36.0)
MCV: 91.5 fL (ref 78.0–100.0)
MONO ABS: 0.7 10*3/uL (ref 0.1–1.0)
MONOS PCT: 6 % (ref 3–12)
Neutro Abs: 7.9 10*3/uL — ABNORMAL HIGH (ref 1.7–7.7)
Neutrophils Relative %: 66 % (ref 43–77)
Platelets: 376 10*3/uL (ref 150–400)
RBC: 4.47 MIL/uL (ref 3.87–5.11)
RDW: 13 % (ref 11.5–15.5)
WBC: 12 10*3/uL — AB (ref 4.0–10.5)

## 2014-02-06 LAB — PRO B NATRIURETIC PEPTIDE: Pro B Natriuretic peptide (BNP): 36.6 pg/mL (ref 0–125)

## 2014-02-06 NOTE — ED Notes (Addendum)
Pt reports bilateral swelling to legs. Reports she feels like she cannot take a good deep breathe without yawning. Smokes 2 packs a day. Denies chest pain. No recent airplane or car trips reported. Reports she has been doing her usual activities and does not take BCP. Reports that she does have mild calf pain tenderness and feet tenderness.

## 2014-02-06 NOTE — ED Notes (Signed)
PT placed on 2 L; pt reports she is unable to take deep breathes. Breathing is swallow. Chest xray ordered; color good; pt not working to breathe.

## 2014-02-06 NOTE — ED Notes (Signed)
Pt reports breathing is getting worse. Pt ambulatory, good color. No distress noted. Pt has already had EKG completed. Pt taken back to reassess resp 14 o2- 85-95%.

## 2014-02-06 NOTE — ED Notes (Signed)
Pt very upset that she is having to wait.  AD called and he is on his way to speak to pt.

## 2014-02-07 ENCOUNTER — Emergency Department (HOSPITAL_COMMUNITY)
Admission: EM | Admit: 2014-02-07 | Discharge: 2014-02-07 | Disposition: A | Discharge status: Home / Self Care | Payer: Self-pay | Attending: Emergency Medicine | Admitting: Emergency Medicine

## 2014-02-07 ENCOUNTER — Ambulatory Visit (HOSPITAL_COMMUNITY): Payer: Self-pay | Attending: Emergency Medicine

## 2014-02-07 ENCOUNTER — Encounter (HOSPITAL_COMMUNITY): Payer: Self-pay | Admitting: Radiology

## 2014-02-07 DIAGNOSIS — R600 Localized edema: Secondary | ICD-10-CM

## 2014-02-07 MED ORDER — IOHEXOL 350 MG/ML SOLN
100.0000 mL | Freq: Once | INTRAVENOUS | Status: AC | PRN
  Administered 2014-02-07: 100 mL via INTRAVENOUS

## 2014-02-07 MED ORDER — ENOXAPARIN SODIUM 100 MG/ML ~~LOC~~ SOLN
1.0000 mg/kg | Freq: Once | SUBCUTANEOUS | Status: AC
  Administered 2014-02-07: 90 mg via SUBCUTANEOUS
  Filled 2014-02-07: qty 1

## 2014-02-07 NOTE — Discharge Instructions (Signed)
IMPORTANT PATIENT INSTRUCTIONS:  ° °You have been scheduled for an Outpatient Vascular Study at [facility].   ° °If tomorrow is a Saturday or Sunday, please go to the [facility] registration desk at 8 AM tomorrow morning and tell them you are therefore a vascular study. ° °If tomorrow is a weekday (Monday - Friday), please go to the [facility]mitting Department at 8 AM and tell them you are therefore a vascular study ° ° ° ° °Peripheral Edema °You have swelling in your legs (peripheral edema). This swelling is due to excess accumulation of salt and water in your body. Edema may be a sign of heart, kidney or liver disease, or a side effect of a medication. It may also be due to problems in the leg veins. Elevating your legs and using special support stockings may be very helpful, if the cause of the swelling is due to poor venous circulation. Avoid long periods of standing, whatever the cause. °Treatment of edema depends on identifying the cause. Chips, pretzels, pickles and other salty foods should be avoided. Restricting salt in your diet is almost always needed. Water pills (diuretics) are often used to remove the excess salt and water from your body via urine. These medicines prevent the kidney from reabsorbing sodium. This increases urine flow. °Diuretic treatment may also result in lowering of potassium levels in your body. Potassium supplements may be needed if you have to use diuretics daily. Daily weights can help you keep track of your progress in clearing your edema. You should call your caregiver for follow up care as recommended. °SEEK IMMEDIATE MEDICAL CARE IF:  °· You have increased swelling, pain, redness, or heat in your legs. °· You develop shortness of breath, especially when lying down. °· You develop chest or abdominal pain, weakness, or fainting. °· You have a fever. °Document Released: 11/04/2004 Document Revised: 12/20/2011 Document Reviewed:  10/15/2009 °ExitCare® Patient Information ©2014 [employer]. ° °

## 2014-02-07 NOTE — ED Provider Notes (Signed)
CSN: [id number]     Arrival date & time 02/06/14  2011 History   First MD Initiated Contact with Patient 02/07/14 0122     Chief Complaint  Patient presents with  . Leg Swelling     HPI Patient reports bilateral lower extremity swelling over the past week.  She reports no significant improvement with elevation.  History DVT or pulmonary and wasn't.  No history of congestive heart failure.  She has some shortness of breath until she takes a big breath.  No active chest pain.  No recent travel or surgery.  She does report mild pain in her bilateral feet.  No recent trauma.  No family history of venous thromboembolic disease.  She does not have a primary care physician.   Past Medical History  Diagnosis Date  . Gallbladder problem   . Anxiety   . Obsessive compulsive disorder   . Depression   . Back pain   . Chronic pain syndrome    Past Surgical History  Procedure Laterality Date  . Cholecystectomy  2009  . Wisdom teeth extraction    . Cesarean section     Family History  Problem Relation Age of Onset  . Diabetes insipidus Father   . Arthritis Father   . Heart failure Father   . COPD Father   . Emphysema Father   . Asthma Son    History  Substance Use Topics  . Smoking status: Current Every Day Smoker -- 0.50 packs/day for 17 years    Types: Cigarettes  . Smokeless tobacco: Never Used  . Alcohol Use: Yes     Comment: unsure amount (1/6) - states generally does not drink   OB History   Grav Para Term Preterm Abortions TAB SAB Ect Mult Living   1 1 1       1      Review of Systems  All other systems reviewed and are negative.     Allergies  Orange oil; Carisoprodol; and Piroxicam  Home Medications   Prior to Admission medications   Medication Sig Start Date End Date Taking? Authorizing Provider  ARIPiprazole (ABILIFY) 5 MG tablet Take 1 tablet (5 mg total) by mouth daily. For major depressive disorder. 06/13/13  Yes Verne SpurrNeil Mashburn, PA-C  gabapentin (NEURONTIN)  300 MG capsule Take 600 mg by mouth 2 (two) times daily with a meal. For neuropathic pain, anxiety and mood stabilization. 06/13/13  Yes Verne SpurrNeil Mashburn, PA-C  gabapentin (NEURONTIN) 400 MG capsule Take 3 capsules (1,200 mg total) by mouth at bedtime. For anxiety, mood stabilization and neuropathic pain. 06/13/13  Yes Verne SpurrNeil Mashburn, PA-C  lamoTRIgine (LAMICTAL) 200 MG tablet Take 1 tablet (200 mg total) by mouth daily. For depression and mood stabilization. 06/13/13  Yes Verne SpurrNeil Mashburn, PA-C  sertraline (ZOLOFT) 100 MG tablet Take 2 tablets (200 mg total) by mouth daily. For depression and anxiety. 06/13/13  Yes Verne SpurrNeil Mashburn, PA-C  traZODone (DESYREL) 100 MG tablet Take 200 mg by mouth at bedtime. For insomnia. 06/13/13  Yes Verne SpurrNeil Mashburn, PA-C   BP 97/67  Pulse 87  Temp(Src) 98.3 F (36.8 C) (Oral)  Resp 21  Ht 5\' 7"  (1.702 m)  Wt 200 lb (90.719 kg)  BMI 31.32 kg/m2  SpO2 99%  LMP 01/14/2014 Physical Exam  Nursing note and vitals reviewed. Constitutional: She is oriented to person, place, and time. She appears well-developed and well-nourished. No distress.  HENT:  Head: Normocephalic and atraumatic.  Eyes: EOM are normal.  Neck: Normal range of  motion.  Cardiovascular: Normal rate, regular rhythm and normal heart sounds.   Pulmonary/Chest: Effort normal and breath sounds normal.  Abdominal: Soft. She exhibits no distension. There is no tenderness.  Musculoskeletal: Normal range of motion.  Trace edema bilaterally.  No unilateral leg swelling as compared to the other.  Normal PT and DP pulse bilaterally.  Full range of motion of bilateral knees and ankles.  Normal strength in lower extremity major muscle groups.  No erythema or focal tenderness  Neurological: She is alert and oriented to person, place, and time.  Skin: Skin is warm and dry.  Psychiatric: She has a normal mood and affect. Judgment normal.    ED Course  Procedures (including critical care time) Labs Review Labs Reviewed   COMPREHENSIVE METABOLIC PANEL - Abnormal; Notable for the following:    Glucose, Bld 102 (*)    Total Bilirubin <0.2 (*)    All other components within normal limits  CBC WITH DIFFERENTIAL - Abnormal; Notable for the following:    WBC 12.0 (*)    Neutro Abs 7.9 (*)    All other components within normal limits  PRO B NATRIURETIC PEPTIDE    Imaging Review Ct Angio Chest Pe W/cm &/or Wo Cm  02/07/2014   CLINICAL DATA:  Bilateral leg swelling. Difficulty taking deep breaths without yawning. History of smoking. Mid calf pain and tenderness.  EXAM: CT ANGIOGRAPHY CHEST WITH CONTRAST  TECHNIQUE: Multidetector CT imaging of the chest was performed using the standard protocol during bolus administration of intravenous contrast. Multiplanar CT image reconstructions and MIPs were obtained to evaluate the vascular anatomy.  CONTRAST:  100mL OMNIPAQUE IOHEXOL 350 MG/ML SOLN  COMPARISON:  Chest radiograph performed 09/09/2012  FINDINGS: There is no evidence of pulmonary embolus. Evaluation for pulmonary embolus is mildly suboptimal due to motion artifact.  The lungs are essentially clear bilaterally. There is no evidence of significant focal consolidation, pleural effusion or pneumothorax. No masses are identified; no abnormal focal contrast enhancement is seen.  The mediastinum is unremarkable in appearance. No mediastinal lymphadenopathy is seen. No pericardial effusion is identified. The great vessels are grossly unremarkable in appearance. Visualized hilar nodes are normal in size. No axillary lymphadenopathy is seen. A 1.9 cm hypodensity within the right thyroid lobe is nonspecific.  The visualized portions of the liver and spleen are unremarkable.  No acute osseous abnormalities are seen.  Review of the MIP images confirms the above findings.  IMPRESSION: 1. No evidence of pulmonary embolus. 2. Lungs clear bilaterally. 3. 1.9 cm hypodensity within the right thyroid lobe. Consider further evaluation with  thyroid ultrasound. If patient is clinically hyperthyroid, consider nuclear medicine thyroid uptake and scan.   Electronically Signed   By: Roanna RaiderJeffery  Chang M.D.   On: 02/07/2014 00:32  I personally reviewed the imaging tests through PACS system I reviewed available ER/hospitalization records through the EMR    EKG Interpretation None      MDM   Final diagnoses:  Bilateral lower extremity edema    Visually given that his Lovenox to return in the morning for bilateral lower extremity duplexes.  Likely venous insufficiency.  Recommended elevation and compression stockings and salt restriction.  [facility]  She understands to return to the ER for new or worsening symptoms    Lyanne CoKevin M Thanos [place], MD 02/07/14 [phone]

## 2014-02-07 NOTE — ED Notes (Signed)
Discharge instructions reviewed with pt. Pt verbalized understanding.   

## 2014-03-19 ENCOUNTER — Emergency Department (HOSPITAL_COMMUNITY)
Admission: EM | Admit: 2014-03-19 | Discharge: 2014-03-20 | Disposition: A | Discharge status: Home / Self Care | Payer: Self-pay | Attending: Emergency Medicine | Admitting: Emergency Medicine

## 2014-03-19 ENCOUNTER — Emergency Department (HOSPITAL_COMMUNITY): Payer: Self-pay

## 2014-03-19 ENCOUNTER — Encounter (HOSPITAL_COMMUNITY): Payer: Self-pay | Admitting: Emergency Medicine

## 2014-03-19 DIAGNOSIS — L738 Other specified follicular disorders: Secondary | ICD-10-CM | POA: Insufficient documentation

## 2014-03-19 DIAGNOSIS — F411 Generalized anxiety disorder: Secondary | ICD-10-CM | POA: Insufficient documentation

## 2014-03-19 DIAGNOSIS — J159 Unspecified bacterial pneumonia: Secondary | ICD-10-CM | POA: Insufficient documentation

## 2014-03-19 DIAGNOSIS — Z3202 Encounter for pregnancy test, result negative: Secondary | ICD-10-CM | POA: Insufficient documentation

## 2014-03-19 DIAGNOSIS — F3289 Other specified depressive episodes: Secondary | ICD-10-CM | POA: Insufficient documentation

## 2014-03-19 DIAGNOSIS — F329 Major depressive disorder, single episode, unspecified: Secondary | ICD-10-CM | POA: Insufficient documentation

## 2014-03-19 DIAGNOSIS — J189 Pneumonia, unspecified organism: Secondary | ICD-10-CM

## 2014-03-19 DIAGNOSIS — Z79899 Other long term (current) drug therapy: Secondary | ICD-10-CM | POA: Insufficient documentation

## 2014-03-19 DIAGNOSIS — R259 Unspecified abnormal involuntary movements: Secondary | ICD-10-CM | POA: Insufficient documentation

## 2014-03-19 DIAGNOSIS — Z8719 Personal history of other diseases of the digestive system: Secondary | ICD-10-CM | POA: Insufficient documentation

## 2014-03-19 DIAGNOSIS — F17200 Nicotine dependence, unspecified, uncomplicated: Secondary | ICD-10-CM | POA: Insufficient documentation

## 2014-03-19 DIAGNOSIS — R251 Tremor, unspecified: Secondary | ICD-10-CM

## 2014-03-19 DIAGNOSIS — L739 Follicular disorder, unspecified: Secondary | ICD-10-CM

## 2014-03-19 LAB — BASIC METABOLIC PANEL
BUN: 5 mg/dL — AB (ref 6–23)
CHLORIDE: 98 meq/L (ref 96–112)
CO2: 20 meq/L (ref 19–32)
Calcium: 9.5 mg/dL (ref 8.4–10.5)
Creatinine, Ser: 0.74 mg/dL (ref 0.50–1.10)
GFR calc Af Amer: >90 mL/min (ref >90)
GLUCOSE: 114 mg/dL — AB (ref 70–99)
Potassium: 3.7 mEq/L (ref 3.7–5.3)
SODIUM: 138 meq/L (ref 137–147)

## 2014-03-19 LAB — CBC
HEMATOCRIT: 37.1 % (ref 36.0–46.0)
Hemoglobin: 13 g/dL (ref 12.0–15.0)
MCH: 32.1 pg (ref 26.0–34.0)
MCHC: 35 g/dL (ref 30.0–36.0)
MCV: 91.6 fL (ref 78.0–100.0)
Platelets: 360 10*3/uL (ref 150–400)
RBC: 4.05 MIL/uL (ref 3.87–5.11)
RDW: 12.6 % (ref 11.5–15.5)
WBC: 21.9 10*3/uL — AB (ref 4.0–10.5)

## 2014-03-19 LAB — I-STAT TROPONIN, ED: Troponin i, poc: 0 ng/mL (ref 0.00–0.08)

## 2014-03-19 LAB — D-DIMER, QUANTITATIVE (NOT AT ARMC)

## 2014-03-19 LAB — PRO B NATRIURETIC PEPTIDE: PRO B NATRI PEPTIDE: 213.9 pg/mL — AB (ref 0–125)

## 2014-03-19 LAB — PREGNANCY, URINE: Preg Test, Ur: NEGATIVE

## 2014-03-19 MED ORDER — MORPHINE SULFATE 4 MG/ML IJ SOLN
4.0000 mg | Freq: Once | INTRAMUSCULAR | Status: AC
  Administered 2014-03-19: 4 mg via INTRAVENOUS
  Filled 2014-03-19: qty 1

## 2014-03-19 MED ORDER — DIAZEPAM 5 MG/ML IJ SOLN
5.0000 mg | Freq: Once | INTRAMUSCULAR | Status: AC
  Administered 2014-03-19: 5 mg via INTRAVENOUS
  Filled 2014-03-19: qty 2

## 2014-03-19 MED ORDER — SODIUM CHLORIDE 0.9 % IV BOLUS (SEPSIS)
1000.0000 mL | Freq: Once | INTRAVENOUS | Status: AC
  Administered 2014-03-19: 1000 mL via INTRAVENOUS

## 2014-03-19 MED ORDER — LORAZEPAM 2 MG/ML IJ SOLN
0.5000 mg | Freq: Once | INTRAMUSCULAR | Status: AC
  Administered 2014-03-19: 0.5 mg via INTRAVENOUS
  Filled 2014-03-19: qty 1

## 2014-03-19 MED ORDER — ONDANSETRON HCL 4 MG/2ML IJ SOLN
4.0000 mg | INTRAMUSCULAR | Status: AC
  Administered 2014-03-19: 4 mg via INTRAVENOUS
  Filled 2014-03-19: qty 2

## 2014-03-19 NOTE — ED Provider Notes (Signed)
CSN: [id number]     Arrival date & time 03/19/14  1853 History   First MD Initiated Contact with Patient 03/19/14 2055     Chief Complaint  Patient presents with  . Chest Pain  . Shortness of Breath  . Dizziness  . Tremors     (Consider location/radiation/quality/duration/timing/severity/associated sxs/prior Treatment) HPI Comments: The patient is a 33 year old female with past medical history of anxiety, depression, OCD, presenting to emergency room chief complaint of shortness of breath and tremors since 0700 today. The patient reports shortness of breath with chest discomfort that started approximately 1500. She describes the chest discomfort as pleuritic, central chest, nonradiating. Denies is change in discomfort with position. She reports increase in pain with ambulation. She reports an early MI in her father. No recent travel, family history or personal history of DVT/PE, lower extremity swelling, smoking, cancer, or exogenous estrogen. Denies history of cocaine, IV drug use, amphetamines. She reports several glasses of wine last night.   Patient is a 33 y.o. female presenting with chest pain, shortness of breath, and dizziness. The history is provided by the patient. No language interpreter was used.  Chest Pain Associated symptoms: abdominal pain, dizziness, nausea and shortness of breath   Associated symptoms: no cough and no fever   Shortness of Breath Associated symptoms: abdominal pain and chest pain   Associated symptoms: no cough and no fever   Dizziness Associated symptoms: chest pain, nausea and shortness of breath   Associated symptoms: no diarrhea     Past Medical History  Diagnosis Date  . Gallbladder problem   . Anxiety   . Obsessive compulsive disorder   . Depression   . Back pain   . Chronic pain syndrome    Past Surgical History  Procedure Laterality Date  . Cholecystectomy  2009  . Wisdom teeth extraction    . Cesarean section     Family History    Problem Relation Age of Onset  . Diabetes insipidus Father   . Arthritis Father   . Heart failure Father   . COPD Father   . Emphysema Father   . Asthma Son    History  Substance Use Topics  . Smoking status: Current Every Day Smoker -- 0.50 packs/day for 17 years    Types: Cigarettes  . Smokeless tobacco: Never Used  . Alcohol Use: Yes     Comment: unsure amount (1/6) - states generally does not drink   OB History   Grav Para Term Preterm Abortions TAB SAB Ect Mult Living   1 1 1       1      Review of Systems  Constitutional: Negative for fever and chills.  Respiratory: Positive for shortness of breath. Negative for cough.   Cardiovascular: Positive for chest pain. Negative for leg swelling.  Gastrointestinal: Positive for nausea and abdominal pain. Negative for diarrhea and constipation.  Neurological: Positive for dizziness.      Allergies  Orange oil; Carisoprodol; and Piroxicam  Home Medications   Prior to Admission medications   Medication Sig Start Date End Date Taking? Authorizing Provider  ARIPiprazole (ABILIFY) 5 MG tablet Take 1 tablet (5 mg total) by mouth daily. For major depressive disorder. 06/13/13  Yes Verne Spurr, PA-C  gabapentin (NEURONTIN) 300 MG capsule Take 600-1,200 mg by mouth 3 (three) times daily. 600mg  in the morning and evening and 1200mg  at bedtime.   Yes Historical Provider, MD  lamoTRIgine (LAMICTAL) 200 MG tablet Take 1 tablet (200 mg total)  by mouth daily. For depression and mood stabilization. 06/13/13  Yes Verne Spurr, PA-C  sertraline (ZOLOFT) 100 MG tablet Take 2 tablets (200 mg total) by mouth daily. For depression and anxiety. 06/13/13  Yes Verne Spurr, PA-C  traZODone (DESYREL) 100 MG tablet Take 200 mg by mouth at bedtime. For insomnia. 06/13/13  Yes Neil Mashburn, PA-C   BP 104/73  Pulse 117  Temp(Src) 98.7 F (37.1 C) (Oral)  Resp 20  Ht 5\' 6"  (1.676 m)  Wt 200 lb (90.719 kg)  BMI 32.30 kg/m2  SpO2 97%  LMP  02/11/2014 Physical Exam  Nursing note and vitals reviewed. Constitutional: She is oriented to person, place, and time. She appears well-developed and well-nourished.  Non-toxic appearance. She does not have a sickly appearance. She does not appear ill. No distress.  HENT:  Head: Normocephalic and atraumatic.  Eyes: EOM are normal. Pupils are equal, round, and reactive to light. No scleral icterus.  Neck: Neck supple.  Cardiovascular: Normal rate and regular rhythm.  Exam reveals distant heart sounds.   No murmur heard. No lower extremity edema  Pulmonary/Chest: Effort normal and breath sounds normal. No respiratory distress. She has no wheezes. She has no rales. She exhibits tenderness.    Discomfort reproducible with palpation of anterior chest wall  Abdominal: Soft. Bowel sounds are normal. There is tenderness in the suprapubic area. There is no rigidity, no rebound, no guarding and no CVA tenderness.  Genitourinary:  Multiple small pustules at the base of pubic hair on the pubis, no large abscess or area of induration. Chaperone present.   Musculoskeletal: Normal range of motion. She exhibits no edema.  Neurological: She is alert and oriented to person, place, and time.  Skin: Skin is warm and dry. No rash noted. She is not diaphoretic.  Psychiatric: Her mood appears anxious.  Tremors are distractible    ED Course  Procedures (including critical care time) Labs Review Results for orders placed during the hospital encounter of 03/19/14  CBC      Result Value Ref Range   WBC 21.9 (*) 4.0 - 10.5 K/uL   RBC 4.05  3.87 - 5.11 MIL/uL   Hemoglobin 13.0  12.0 - 15.0 g/dL   HCT 37.0  48.8 - 89.1 %   MCV 91.6  78.0 - 100.0 fL   MCH 32.1  26.0 - 34.0 pg   MCHC 35.0  30.0 - 36.0 g/dL   RDW 69.4  50.3 - 88.8 %   Platelets 360  150 - 400 K/uL  BASIC METABOLIC PANEL      Result Value Ref Range   Sodium 138  137 - 147 mEq/L   Potassium 3.7  3.7 - 5.3 mEq/L   Chloride 98  96 - 112  mEq/L   CO2 20  19 - 32 mEq/L   Glucose, Bld 114 (*) 70 - 99 mg/dL   BUN 5 (*) 6 - 23 mg/dL   Creatinine, Ser 2.80  0.50 - 1.10 mg/dL   Calcium 9.5  8.4 - 03.4 mg/dL   GFR calc non Af Amer >90  >90 mL/min   GFR calc Af Amer >90  >90 mL/min  PRO B NATRIURETIC PEPTIDE      Result Value Ref Range   Pro B Natriuretic peptide (BNP) 213.9 (*) 0 - 125 pg/mL  D-DIMER, QUANTITATIVE      Result Value Ref Range   D-Dimer, Quant <0.27  0.00 - 0.48 ug/mL-FEU  PREGNANCY, URINE      Result  Value Ref Range   Preg Test, Ur NEGATIVE  NEGATIVE  I-STAT TROPOININ, ED      Result Value Ref Range   Troponin i, poc 0.00  0.00 - 0.08 ng/mL   Comment 3            Dg Chest 2 View  03/19/2014   CLINICAL DATA:  Chest pain with shortness of breath.  EXAM: CHEST  2 VIEW  COMPARISON:  02/06/2014 CT.  Chest x-ray 09/09/2012.  FINDINGS: Bilateral patchy pulmonary opacities, left greater than right, could represent pneumonia. No effusion or pneumothorax. No osseous findings. Normal cardiomediastinal silhouette.  IMPRESSION: Patchy pulmonary opacities suggesting bilateral pneumonia.   Electronically Signed   By: Davonna BellingJohn  Curnes M.D.   On: 03/19/2014 22:13     EKG Interpretation   Date/Time:  Tuesday March 19 2014 18:58:53 EDT Ventricular Rate:  120 PR Interval:  136 QRS Duration: 66 QT Interval:  300 QTC Calculation: 424 R Axis:   72 Text Interpretation:  Sinus tachycardia Otherwise normal ECG No  significant change since last tracing Confirmed by GOLDSTON  MD, SCOTT  ([id number]) on 03/19/2014 9:28:34 PM      MDM   Final diagnoses:  CAP (community acquired pneumonia)  Folliculitis  Tremor   She presents with left-sided chest discomfort, shortness of breath and tremors. Low suspicion for PE, d-dimer ordered. XR shows bilateral opacities, afebrile, no cough but will treat for pneumonia dues to pleuritic chest pain and leukocytosis at 21.9. Dr. Criss AlvineGoldston also evaluated the patient during this encounter. Exam shows  folliculitis of mons pubis, no obvious abscess. Pt with tremors/chills.  Discussed lab results, imaging results, and treatment plan with the patient. Return precautions given. Reports understanding and no other concerns at this time.  Patient is stable for discharge at this time.   Meds given in ED:  Medications  sodium chloride 0.9 % bolus 1,000 mL (0 mLs Intravenous Stopped 03/20/14 0022)  morphine 4 MG/ML injection 4 mg (4 mg Intravenous Given 03/19/14 2141)  ondansetron (ZOFRAN) injection 4 mg (4 mg Intravenous Given 03/19/14 2141)  LORazepam (ATIVAN) injection 0.5 mg (0.5 mg Intravenous Given 03/19/14 2141)  morphine 4 MG/ML injection 4 mg (4 mg Intravenous Given 03/19/14 2333)  diazepam (VALIUM) injection 5 mg (5 mg Intravenous Given 03/19/14 2330)    Discharge Medication List as of 03/20/2014 12:11 AM    START taking these medications   Details  ALPRAZolam (XANAX) 0.5 MG tablet Take 1 tablet (0.5 mg total) by mouth at bedtime as needed for anxiety., Starting 03/20/2014, Until Discontinued, Print    azithromycin (ZITHROMAX) 250 MG tablet Take 1 tablet (250 mg total) by mouth daily. Take first 2 tablets together, then 1 every day until finished., Starting 03/20/2014, Until Discontinued, Print    Erythromycin 2 % ointment Apply to affected area 2 times daily, Print    HYDROcodone-acetaminophen (NORCO/VICODIN) 5-325 MG per tablet Take 1 tablet by mouth every 4 (four) hours as needed for moderate pain or severe pain., Starting 03/20/2014, Until Discontinued, Print            Clabe SealLauren M Naavya Postma, PA-C 03/22/14 1108

## 2014-03-19 NOTE — ED Notes (Signed)
Pt presents with Left side chest pain, tremors, dizziness, increase thirst, and SOB starting 0700 this am

## 2014-03-20 MED ORDER — ALPRAZOLAM 0.5 MG PO TABS: 0.5000 mg | ORAL_TABLET | Freq: Every evening | ORAL | Status: DC | PRN

## 2014-03-20 MED ORDER — ERYTHROMYCIN 2 % EX OINT: TOPICAL_OINTMENT | CUTANEOUS | Status: DC

## 2014-03-20 MED ORDER — AZITHROMYCIN 250 MG PO TABS: 250.0000 mg | ORAL_TABLET | Freq: Every day | ORAL | Status: DC

## 2014-03-20 MED ORDER — HYDROCODONE-ACETAMINOPHEN 5-325 MG PO TABS: 1.0000 | ORAL_TABLET | ORAL | Status: DC | PRN

## 2014-03-20 NOTE — Discharge Instructions (Signed)
Call for a follow up appointment with a Family or Primary Care Provider.  Call your behavior health specialist for further evaluation of your medications. Return if Symptoms worsen.   Take medication as prescribed.     Emergency Department Resource Guide 1) Find a Doctor and Pay Out of Pocket Although you won't have to find out who is covered by your insurance plan, it is a good idea to ask around and get recommendations. You will then need to call the office and see if the doctor you have chosen will accept you as a new patient and what types of options they offer for patients who are self-pay. Some doctors offer discounts or will set up payment plans for their patients who do not have insurance, but you will need to ask so you aren't surprised when you get to your appointment.  2) Contact Your Local Health Department Not all health departments have doctors that can see patients for sick visits, but many do, so it is worth a call to see if yours does. If you don't know where your local health department is, you can check in your phone book. The CDC also has a tool to help you locate your state's health department, and many state websites also have listings of all of their local health departments.  3) Find a Walk-in Clinic If your illness is not likely to be very severe or complicated, you may want to try a walk in clinic. These are popping up all over the country in pharmacies, drugstores, and shopping centers. They're usually staffed by nurse practitioners or physician assistants that have been trained to treat common illnesses and complaints. They're usually fairly quick and inexpensive. However, if you have serious medical issues or chronic medical problems, these are probably not your best option.  No Primary Care Doctor: - Call Health Connect at  [phone] - they can help you locate a primary care doctor that  accepts your insurance, provides certain services, etc. - Physician Referral  Service- [phone]  Chronic Pain Problems: Organization         Address  Phone   Notes  [facility]  [phone] Patients need to be referred by their primary care doctor.   Medication Assistance: Organization         Address  Phone   Notes  [facility] Medication [facility] [address] [place] [place], [place] [address] [phone] --Must be a resident of [facility] -- Must have NO insurance coverage whatsoever (no Medicaid/ Medicare, etc.) -- The pt. MUST have a primary care doctor that directs their care regularly and follows them in the community   [facility]ist  [phone]   [employer]  [phone]    Agencies that provide inexpensive medical care: Organization         Address  Phone   Notes  [facility]  [phone]   [facility]    [phone]   [facility] [address] [place], [place] [address] [phone]   [facility] [address] [place] [place] [phone]   Planned Parenthood    [phone]   [facility]    [phone]   [facility]  [address], [place] Phone:  [phone], Fax:  [phone] Hours of Operation:  9 am - 6 pm, M-F.  Also accepts Medicaid/Medicare and self-pay.  [facility]  [place] for [place], [address], [place] Phone: [phone], Fax: [phone]. Hours of Operation:  8:30 am - 5:30 pm, M-F.  Also accepts Medicaid and self-pay.  [facility] [place] [address] [place] Phone: [phone]   [place], [place], [place] [phone], Ext. [phone] Mondays & Thursdays: 7-9 AM.  First 15 patients are seen on a first come, first serve basis.    [place] Providers:  Organization         Address  Phone   Notes  [facility] [address], [place] [phone] Also accepts self-pay patients.  [facility] [address], [place]  [phone]   [place], [address], [place] [phone]   [facility] [address] [place] [phone]   Lucianne Lei [address], [place]   [phone] Only accepts [place] Access [place] patients after they have their name applied to their card.   Self-Pay (no insurance) in [facility]:  Organization         Address  Phone   Notes  Sickle Cell Patients, [facility] [place] [phone]   [facility] Urgent Care [place] [phone]   [facility] [place]  [place], [place], [place] [phone]   [facility]/Dr. Osei-Bonsu  [address], [place] or [address], [place] [phone] Phone number for both [place] and [place] locations is the same.  Urgent Medical and [facility] [address] [place] [phone]   [facility] [address] [place] or [address] [phone]   [facility] [address], [place] [phone], phone; [phone], fax Sees patients 1st and 3rd Saturday of every month.  Must not qualify for public or private insurance (i.e. Medicaid, Medicare, [place] Health Choice, Veterans' Benefits)  Household income should be no more than 200% of the poverty level The clinic cannot treat you if you are pregnant or think you are pregnant  Sexually transmitted diseases are not treated at the clinic.    Dental Care: Organization         Address  Phone  Notes  [facility] Department of [facility] [place] [phone] Accepts children up to age 23 who are enrolled in  [place] or [place]; pregnant women with a Medicaid card; and children who have applied for Medicaid or [place] Health Choice, but were declined, whose parents can pay a reduced fee at time of service.  [facility] Department of [facility]  [address], [place] [phone] Accepts children up to age 30 who are enrolled in [place] or [place]; pregnant women with a Medicaid card; and children who have applied for Medicaid or [place] Health Choice, but were declined, whose parents can pay a reduced fee at time of service.  [place] PRO[facility]  [place] [phone] Patients are seen by appointment only. Walk-ins are not accepted. [place] will see patients 70 years of age and older. Monday - Tuesday (8am-5pm) Most Wednesdays (8:30-5pm) $30 per visit,  cash only  [employer] Adult [employer] PROGRAM  [address], [place] [phone] Patients are seen by appointment only. Walk-ins are not accepted. [place] will see patients 68 years of age and older. One Wednesday Evening (Monthly: Volunteer Based).  $30 per visit, cash only  [place]  [phone] for adults; Children under age 32, call Graduate Pediatric Dentistry at [phone]. Children aged 36-14, please call [phone] to request a pediatric application.  Dental services are provided in all areas of dental care including fillings, crowns and bridges, complete and partial dentures, implants, gum treatment, root canals, and extractions. Preventive care is also provided. Treatment is provided to both adults and children. Patients are selected via a lottery and there is often a waiting list.   [facility] [address] [place]  [phone] [email or web address]   [facility] [address] [place], [place] [phone], Ext. [phone] Second and Fourth Thursday of each month, opens at 6:30  AM; Clinic ends at 9 AM.  Patients are seen on a first-come first-served basis, and a limited number are seen during each clinic.   [facility]  [address] [place]ard Danker [place], [place] [phone]   Eligibility Requirements You must have lived in [place], [place], or [place] counties for at least the last three months.   You cannot be eligible for state or federal sponsored [employer], including [employer], [place], or [employer].   You generally cannot be eligible for healthcare insurance through your employer.    How to apply: Eligibility screenings are held every Tuesday and Wednesday afternoon from 1:00 pm until 4:00 pm. You do not need an appointment for the interview!  [facility] [address] [place], [place]   [place]  [place] Department  [place]  [phone]    Behavioral Health Resources in the Community: Intensive Outpatient Programs Organization         Address  Phone  Notes  Bejou Flanagan. [address], [place], [place] [phone]   [facility] Outpatient [address] [place], [place]   ADS: Alcohol & Drug S[facility] [address] [place], [place]   [place] [address],  [place], [place] or [phone]   Substance Abuse Resources Organization         Address  Phone  Notes  Alcohol and Drug Services  [phone]   [place]  [phone]   [facility] [place]   Chinita Pester  [phone]   Residential & Outpatient Substance Abuse Program  [phone]   Psychological Services Organization         Address  Phone  Notes  [facility] [place]  [place]  [phone]   [place] [address] [place] or  [phone]    Mobile Crisis Teams Organization         Address  Phone  Notes  [facility], Mobile Crisis Care Unit  [phone]   [facility]  [address] [place], [place]   [place] [address], [place] [phone]    Self-Help/Support Groups Organization         Address  Phone  Notes  [facility] [place] - variety of support groups  [place] Call for more information  Narcotics Anonymous (NA), [facility] [address], [employer] [place]  2 meetings at this location   Special educational needs teacher         Address  Phone  Notes  ASAP Residential Treatment [place],    [place]  [phone]   [facility]  [address] [place] [phone], [place], [place]   [place], [place] [phone] Admissions: 8am-3pm M-F  Incentives Substance [place] [address]    [place], [place] [phone]   [facility] [address] [place], [place], [place]   [facility] [address],  [place], [place]   [facility] Programs - Intensive Outpatient [address] Kristeen [address], [place], [place]   [facility] [address].,  [place], [place] [phone] or [phone]   [facility] Services (RTS) [address] [place], [place] Accepts Medicaid  [facility] [place] [address],  [place] [phone] Substance Abuse/Addiction Treatment   [facility] The Organization         Address  Phone  Notes  [facility]  [phone]   Domenic Schwab, PhD [address] Arlis Porta [place], [place]   [phone] or [phone]   [place] and [place], [place] [phone]   [facility] [address],  [place], [place] [phone] Insurance/Medicaid/sponsorship through [facility] and Families [address], [address] [place]                                    [place], [place] [phone] [place] [address] [place], [place] [phone]    Dr. Adele Schilder  [phone]   [facility] Dept. 1) [address], [place] 2) [place] 3)  [place] [address], [place] [phone]  [phone]   [place] [phone] or [phone] (After Hours)

## 2014-03-24 NOTE — ED Provider Notes (Signed)
Medical screening examination/treatment/procedure(s) were conducted as a shared visit with non-physician practitioner(s) and myself.  I personally evaluated the patient during the encounter.   EKG Interpretation   Date/Time:  Tuesday March 19 2014 18:58:53 EDT Ventricular Rate:  120 PR Interval:  136 QRS Duration: 66 QT Interval:  300 QTC Calculation: 424 R Axis:   72 Text Interpretation:  Sinus tachycardia Otherwise normal ECG No  significant change since last tracing Confirmed by Aigner Horseman  MD, Lourine Alberico  ([id number]) on 03/19/2014 9:28:34 PM       Patient with chest pain and shortness of breath. No significant cough, but x-ray is concerning for new opacities. We'll treat as a pneumonia. She has no hypoxia. She does have some tremors that are mildly improved with benzodiazepines. Discharge stable for outpatient treatment of her pneumonia.  Audree CamelScott T Katrece Roediger, MD 03/24/14 [phone]

## 2014-03-25 ENCOUNTER — Emergency Department (HOSPITAL_COMMUNITY): Payer: Self-pay

## 2014-03-25 ENCOUNTER — Emergency Department (HOSPITAL_COMMUNITY)
Admission: EM | Admit: 2014-03-25 | Discharge: 2014-03-26 | Disposition: A | Discharge status: Home / Self Care | Payer: Self-pay | Attending: Emergency Medicine | Admitting: Emergency Medicine

## 2014-03-25 ENCOUNTER — Encounter (HOSPITAL_COMMUNITY): Payer: Self-pay | Admitting: Emergency Medicine

## 2014-03-25 DIAGNOSIS — Z8701 Personal history of pneumonia (recurrent): Secondary | ICD-10-CM | POA: Insufficient documentation

## 2014-03-25 DIAGNOSIS — Z79899 Other long term (current) drug therapy: Secondary | ICD-10-CM | POA: Insufficient documentation

## 2014-03-25 DIAGNOSIS — F411 Generalized anxiety disorder: Secondary | ICD-10-CM | POA: Insufficient documentation

## 2014-03-25 DIAGNOSIS — J159 Unspecified bacterial pneumonia: Secondary | ICD-10-CM | POA: Insufficient documentation

## 2014-03-25 DIAGNOSIS — Z8719 Personal history of other diseases of the digestive system: Secondary | ICD-10-CM | POA: Insufficient documentation

## 2014-03-25 DIAGNOSIS — F329 Major depressive disorder, single episode, unspecified: Secondary | ICD-10-CM | POA: Insufficient documentation

## 2014-03-25 DIAGNOSIS — J189 Pneumonia, unspecified organism: Secondary | ICD-10-CM

## 2014-03-25 DIAGNOSIS — F3289 Other specified depressive episodes: Secondary | ICD-10-CM | POA: Insufficient documentation

## 2014-03-25 DIAGNOSIS — F429 Obsessive-compulsive disorder, unspecified: Secondary | ICD-10-CM | POA: Insufficient documentation

## 2014-03-25 DIAGNOSIS — F17200 Nicotine dependence, unspecified, uncomplicated: Secondary | ICD-10-CM | POA: Insufficient documentation

## 2014-03-25 DIAGNOSIS — G894 Chronic pain syndrome: Secondary | ICD-10-CM | POA: Insufficient documentation

## 2014-03-25 HISTORY — DX: Pneumonia, unspecified organism: J18.9

## 2014-03-25 LAB — BASIC METABOLIC PANEL
BUN: 7 mg/dL (ref 6–23)
CALCIUM: 10.4 mg/dL (ref 8.4–10.5)
CHLORIDE: 103 meq/L (ref 96–112)
CO2: 22 meq/L (ref 19–32)
Creatinine, Ser: 0.73 mg/dL (ref 0.50–1.10)
GFR calc Af Amer: >90 mL/min (ref >90)
GFR calc non Af Amer: >90 mL/min (ref >90)
Glucose, Bld: 105 mg/dL — ABNORMAL HIGH (ref 70–99)
Potassium: 4.1 mEq/L (ref 3.7–5.3)
SODIUM: 141 meq/L (ref 137–147)

## 2014-03-25 LAB — CBC
HCT: 42.3 % (ref 36.0–46.0)
Hemoglobin: 15 g/dL (ref 12.0–15.0)
MCH: 32.1 pg (ref 26.0–34.0)
MCHC: 35.5 g/dL (ref 30.0–36.0)
MCV: 90.4 fL (ref 78.0–100.0)
PLATELETS: 496 10*3/uL — AB (ref 150–400)
RBC: 4.68 MIL/uL (ref 3.87–5.11)
RDW: 12.7 % (ref 11.5–15.5)
WBC: 9 10*3/uL (ref 4.0–10.5)

## 2014-03-25 LAB — I-STAT TROPONIN, ED: TROPONIN I, POC: 0.01 ng/mL (ref 0.00–0.08)

## 2014-03-25 LAB — PRO B NATRIURETIC PEPTIDE: PRO B NATRI PEPTIDE: 11.8 pg/mL (ref 0–125)

## 2014-03-25 NOTE — ED Notes (Signed)
Pt. reports left chest pain with SOB , chills, and body aches onset last week , seen here last 03/19/2014 diagnosed with pneumonia prescribed with Zithromax with no improvement .

## 2014-03-26 MED ORDER — ALBUTEROL SULFATE HFA 108 (90 BASE) MCG/ACT IN AERS
1.0000 | INHALATION_SPRAY | RESPIRATORY_TRACT | Status: DC | PRN
  Administered 2014-03-26: 2 via RESPIRATORY_TRACT
  Filled 2014-03-26: qty 6.7

## 2014-03-26 MED ORDER — ALBUTEROL SULFATE HFA 108 (90 BASE) MCG/ACT IN AERS: 1.0000 | INHALATION_SPRAY | RESPIRATORY_TRACT | Status: DC | PRN

## 2014-03-26 NOTE — ED Provider Notes (Signed)
CSN: [id number]     Arrival date & time 03/25/14  2101 History   First MD Initiated Contact with Patient 03/25/14 2303     Chief Complaint  Patient presents with  . Chest Pain     (Consider location/radiation/quality/duration/timing/severity/associated sxs/prior Treatment) HPI 33 year old female presents to emergency room with complaint of persistent bodyaches and chest tightness.  Patient was seen on June 9 and diagnosed with pneumonia.  She's finished the antibiotics.  She denies any fever although she has had some chills.  Patient is a smoker.  She reports she is out of the medications given to her as well as her psychiatric medications due to insufficient funds. Past Medical History  Diagnosis Date  . Gallbladder problem   . Anxiety   . Obsessive compulsive disorder   . Depression   . Back pain   . Chronic pain syndrome   . CAP (community acquired pneumonia)    Past Surgical History  Procedure Laterality Date  . Cholecystectomy  2009  . Wisdom teeth extraction    . Cesarean section     Family History  Problem Relation Age of Onset  . Diabetes insipidus Father   . Arthritis Father   . Heart failure Father   . COPD Father   . Emphysema Father   . Asthma Son    History  Substance Use Topics  . Smoking status: Current Every Day Smoker -- 0.50 packs/day for 17 years    Types: Cigarettes  . Smokeless tobacco: Never Used  . Alcohol Use: Yes     Comment: unsure amount (1/6) - states generally does not drink   OB History   Grav Para Term Preterm Abortions TAB SAB Ect Mult Living   1 1 1       1      Review of Systems  See History of Present Illness; otherwise all other systems are reviewed and negative   Allergies  Orange oil; Carisoprodol; and Piroxicam  Home Medications   Prior to Admission medications   Medication Sig Start Date End Date Taking? Authorizing Provider  ALPRAZolam Prudy Feeler(XANAX) 0.5 MG tablet Take 1 tablet (0.5 mg total) by mouth at bedtime as needed  for anxiety. 03/20/14  Yes Lauren Doretha ImusM Parker, PA-C  ARIPiprazole (ABILIFY) 5 MG tablet Take 1 tablet (5 mg total) by mouth daily. For major depressive disorder. 06/13/13  Yes Verne SpurrNeil Mashburn, PA-C  Erythromycin 2 % ointment Apply to affected area 2 times daily 03/20/14  Yes Lauren Doretha ImusM Parker, PA-C  gabapentin (NEURONTIN) 300 MG capsule Take 600-1,200 mg by mouth 3 (three) times daily. 600mg  in the morning and evening and 1200mg  at bedtime.   Yes Historical Provider, MD  HYDROcodone-acetaminophen (NORCO/VICODIN) 5-325 MG per tablet Take 1 tablet by mouth every 4 (four) hours as needed for moderate pain or severe pain. 03/20/14  Yes Lauren Doretha ImusM Parker, PA-C  lamoTRIgine (LAMICTAL) 200 MG tablet Take 1 tablet (200 mg total) by mouth daily. For depression and mood stabilization. 06/13/13  Yes Verne SpurrNeil Mashburn, PA-C  sertraline (ZOLOFT) 100 MG tablet Take 2 tablets (200 mg total) by mouth daily. For depression and anxiety. 06/13/13  Yes Verne SpurrNeil Mashburn, PA-C  traZODone (DESYREL) 100 MG tablet Take 200 mg by mouth at bedtime. For insomnia. 06/13/13  Yes Verne SpurrNeil Mashburn, PA-C  albuterol (PROVENTIL HFA;VENTOLIN HFA) 108 (90 BASE) MCG/ACT inhaler Inhale 1-2 puffs into the lungs every 4 (four) hours as needed for wheezing or shortness of breath. 03/26/14   Olivia Mackielga M Babette Stum, MD   BP  117/76  Pulse 65  Temp(Src) 97.6 F (36.4 C) (Oral)  Resp 22  Wt 205 lb 6 oz (93.157 kg)  SpO2 98%  LMP 03/25/2014 Physical Exam  Nursing note and vitals reviewed. Constitutional: She is oriented to person, place, and time. She appears well-developed and well-nourished.  HENT:  Head: Normocephalic and atraumatic.  Nose: Nose normal.  Mouth/Throat: Oropharynx is clear and moist.  Eyes: Conjunctivae and EOM are normal. Pupils are equal, round, and reactive to light.  Neck: Normal range of motion. Neck supple. No JVD present. No tracheal deviation present. No thyromegaly present.  Cardiovascular: Normal rate, regular rhythm, normal heart sounds and intact  distal pulses.  Exam reveals no gallop and no friction rub.   No murmur heard. Pulmonary/Chest: Effort normal and breath sounds normal. No stridor. No respiratory distress. She has no wheezes. She has no rales. She exhibits no tenderness.  Abdominal: Soft. Bowel sounds are normal. She exhibits no distension and no mass. There is no tenderness. There is no rebound and no guarding.  Musculoskeletal: Normal range of motion. She exhibits no edema and no tenderness.  Lymphadenopathy:    She has no cervical adenopathy.  Neurological: She is alert and oriented to person, place, and time. She exhibits normal muscle tone. Coordination normal.  Skin: Skin is dry. No rash noted. No erythema. No pallor.  Psychiatric: She has a normal mood and affect. Her behavior is normal. Judgment and thought content normal.    ED Course  Procedures (including critical care time) Labs Review Labs Reviewed  CBC - Abnormal; Notable for the following:    Platelets 496 (*)    All other components within normal limits  BASIC METABOLIC PANEL - Abnormal; Notable for the following:    Glucose, Bld 105 (*)    All other components within normal limits  PRO B NATRIURETIC PEPTIDE  I-STAT TROPOININ, ED    Imaging Review Dg Chest 2 View  03/25/2014   CLINICAL DATA:  Left and centralized chest pain. Shortness of breath and dizziness.  EXAM: CHEST  2 VIEW  COMPARISON:  Chest radiograph performed 03/19/2014  FINDINGS: The lungs are well-aerated and clear. There is no evidence of focal opacification, pleural effusion or pneumothorax.  The heart is normal in size; the mediastinal contour is within normal limits. No acute osseous abnormalities are seen. Clips are noted within the right upper quadrant, reflecting prior cholecystectomy.  IMPRESSION: No acute cardiopulmonary process seen.   Electronically Signed   By: Roanna RaiderJeffery  Chang M.D.   On: 03/25/2014 22:06     EKG Interpretation   Date/Time:  Monday March 25 2014 21:09:08  EDT Ventricular Rate:  95 PR Interval:  142 QRS Duration: 62 QT Interval:  370 QTC Calculation: 464 R Axis:   60 Text Interpretation:  Normal sinus rhythm Nonspecific ST and T wave  abnormality Prolonged QT Abnormal ECG Confirmed by Haedyn Breau  MD, Breezy Hertenstein ([id number])  on 03/25/2014 11:14:37 PM      MDM   Final diagnoses:  CAP (community acquired pneumonia)    33 year old female with recent pneumonia.  White blood cell count and chest x-ray have normalized.  Patient was reassured, explained that it will take some time for her body to heal from his recent infection and she should expect some fatigue and chest tightness especially in light of her smoking history.  She will receive a albuterol inhaler she is instructed to take Tylenol or ibuprofen for body aches.    Olivia Mackielga M Nelida Mandarino, MD 03/26/14 [phone]

## 2014-03-26 NOTE — ED Notes (Signed)
Pt A&OX4, ambulatory at d/ with steady gait, NAD. Pt declined wheelchair.

## 2014-03-26 NOTE — Discharge Instructions (Signed)
Your pneumonia on chest xray has resolved.  Expect to be run down and achy while your body heals from this infection.  Avoid smoking.  Take tylenol or motrin for body aches.  Use inhaler as needed for tightness in the chest.  Follow up with your mental health provider for refills on your medications   Pneumonia, Adult Pneumonia is an infection of the lungs.  CAUSES Pneumonia may be caused by bacteria or a virus. Usually, these infections are caused by breathing infectious particles into the lungs (respiratory tract). SYMPTOMS   Cough.  Fever.  Chest pain.  Increased rate of breathing.  Wheezing.  Mucus production. DIAGNOSIS  If you have the common symptoms of pneumonia, your caregiver will typically confirm the diagnosis with a chest X-ray. The X-ray will show an abnormality in the lung (pulmonary infiltrate) if you have pneumonia. Other tests of your blood, urine, or sputum may be done to find the specific cause of your pneumonia. Your caregiver may also do tests (blood gases or pulse oximetry) to see how well your lungs are working. TREATMENT  Some forms of pneumonia may be spread to other people when you cough or sneeze. You may be asked to wear a mask before and during your exam. Pneumonia that is caused by bacteria is treated with antibiotic medicine. Pneumonia that is caused by the influenza virus may be treated with an antiviral medicine. Most other viral infections must run their course. These infections will not respond to antibiotics.  PREVENTION A pneumococcal shot (vaccine) is available to prevent a common bacterial cause of pneumonia. This is usually suggested for:  People over 33 years old.  Patients on chemotherapy.  People with chronic lung problems, such as bronchitis or emphysema.  People with immune system problems. If you are over 65 or have a high risk condition, you may receive the pneumococcal vaccine if you have not received it before. In some countries, a  routine influenza vaccine is also recommended. This vaccine can help prevent some cases of pneumonia.You may be offered the influenza vaccine as part of your care. If you smoke, it is time to quit. You may receive instructions on how to stop smoking. Your caregiver can provide medicines and counseling to help you quit. HOME CARE INSTRUCTIONS   Cough suppressants may be used if you are losing too much rest. However, coughing protects you by clearing your lungs. You should avoid using cough suppressants if you can.  Your caregiver may have prescribed medicine if he or she thinks your pneumonia is caused by a bacteria or influenza. Finish your medicine even if you start to feel better.  Your caregiver may also prescribe an expectorant. This loosens the mucus to be coughed up.  Only take over-the-counter or prescription medicines for pain, discomfort, or fever as directed by your caregiver.  Do not smoke. Smoking is a common cause of bronchitis and can contribute to pneumonia. If you are a smoker and continue to smoke, your cough may last several weeks after your pneumonia has cleared.  A cold steam vaporizer or humidifier in your room or home may help loosen mucus.  Coughing is often worse at night. Sleeping in a semi-upright position in a recliner or using a couple pillows under your head will help with this.  Get rest as you feel it is needed. Your body will usually let you know when you need to rest. SEEK IMMEDIATE MEDICAL CARE IF:   Your illness becomes worse. This is especially true  if you are elderly or weakened from any other disease.  You cannot control your cough with suppressants and are losing sleep.  You begin coughing up blood.  You develop pain which is getting worse or is uncontrolled with medicines.  You have a fever.  Any of the symptoms which initially brought you in for treatment are getting worse rather than better.  You develop shortness of breath or chest  pain. MAKE SURE YOU:   Understand these instructions.  Will watch your condition.  Will get help right away if you are not doing well or get worse. Document Released: 09/27/2005 Document Revised: 12/20/2011 Document Reviewed: 12/17/2010 [facility] Patient Information 2014 [place], [place].

## 2014-08-12 ENCOUNTER — Encounter (HOSPITAL_COMMUNITY): Payer: Self-pay | Admitting: Emergency Medicine

## 2016-04-06 ENCOUNTER — Emergency Department (HOSPITAL_COMMUNITY)
Admission: EM | Admit: 2016-04-06 | Discharge: 2016-04-06 | Disposition: A | Discharge status: Home / Self Care | Payer: Self-pay | Attending: Emergency Medicine | Admitting: Emergency Medicine

## 2016-04-06 ENCOUNTER — Encounter (HOSPITAL_COMMUNITY): Payer: Self-pay

## 2016-04-06 ENCOUNTER — Emergency Department (HOSPITAL_COMMUNITY): Payer: Self-pay

## 2016-04-06 DIAGNOSIS — S93401A Sprain of unspecified ligament of right ankle, initial encounter: Secondary | ICD-10-CM | POA: Insufficient documentation

## 2016-04-06 DIAGNOSIS — Y939 Activity, unspecified: Secondary | ICD-10-CM | POA: Insufficient documentation

## 2016-04-06 DIAGNOSIS — S90812A Abrasion, left foot, initial encounter: Secondary | ICD-10-CM | POA: Insufficient documentation

## 2016-04-06 DIAGNOSIS — Y999 Unspecified external cause status: Secondary | ICD-10-CM | POA: Insufficient documentation

## 2016-04-06 DIAGNOSIS — F17210 Nicotine dependence, cigarettes, uncomplicated: Secondary | ICD-10-CM | POA: Insufficient documentation

## 2016-04-06 DIAGNOSIS — Y929 Unspecified place or not applicable: Secondary | ICD-10-CM | POA: Insufficient documentation

## 2016-04-06 DIAGNOSIS — W109XXA Fall (on) (from) unspecified stairs and steps, initial encounter: Secondary | ICD-10-CM | POA: Insufficient documentation

## 2016-04-06 MED ORDER — OXYCODONE-ACETAMINOPHEN 5-325 MG PO TABS
1.0000 | ORAL_TABLET | ORAL | Status: DC | PRN
  Administered 2016-04-06: 1 via ORAL

## 2016-04-06 MED ORDER — ONDANSETRON 4 MG PO TBDP
4.0000 mg | ORAL_TABLET | Freq: Once | ORAL | Status: AC | PRN
  Administered 2016-04-06: 4 mg via ORAL

## 2016-04-06 MED ORDER — ONDANSETRON 4 MG PO TBDP
ORAL_TABLET | ORAL | Status: AC
  Filled 2016-04-06: qty 1

## 2016-04-06 MED ORDER — OXYCODONE-ACETAMINOPHEN 5-325 MG PO TABS
ORAL_TABLET | ORAL | Status: DC
Start: 2016-04-06 — End: 2016-04-06
  Filled 2016-04-06: qty 1

## 2016-04-06 MED ORDER — HYDROCODONE-ACETAMINOPHEN 5-325 MG PO TABS: 1.0000 | ORAL_TABLET | ORAL | Status: DC | PRN

## 2016-04-06 NOTE — ED Notes (Signed)
Per Pt, Pt was coming down the steps when she fell down the last step and hurt her right ankle. Swelling noted to the ankle with pain. Pulses noted

## 2016-04-06 NOTE — ED Notes (Signed)
PA at bedside.

## 2016-04-06 NOTE — ED Provider Notes (Signed)
CSN: [id number]     Arrival date & time 04/06/16  1402 History  By signing my name below, I, Ronney LionSuzanne Le, attest that this documentation has been prepared under the direction and in the presence of Newell RubbermaidJeffrey Penny Arrambide, PA-C. Electronically Signed: Ronney LionSuzanne Le, ED Scribe. 04/06/2016. 3:42 PM.    Chief Complaint  Patient presents with  . Ankle Pain   The history is provided by the patient. No language interpreter was used.    HPI Comments:  [name] is a 35 y.o. female with a history of chronic pain syndrome, who presents to the Emergency Department complaining of sudden-onset, constant, 10/10, aching right ankle pain that began last night after missing the last step while walking down the stairs, impacting her ankles. She also complains of associated swelling to the area. She notes some pain in her left ankle, but she states the pain in her right ankle is much greater than the pain in her left ankle. No other injuries were noted. No treatments were noted. Palpation and movement exacerbate her pain.   Past Medical History  Diagnosis Date  . Gallbladder problem   . Anxiety   . Obsessive compulsive disorder   . Depression   . Back pain   . Chronic pain syndrome   . CAP (community acquired pneumonia)    Past Surgical History  Procedure Laterality Date  . Cholecystectomy  2009  . Wisdom teeth extraction    . Cesarean section     Family History  Problem Relation Age of Onset  . Diabetes insipidus Father   . Arthritis Father   . Heart failure Father   . COPD Father   . Emphysema Father   . Asthma Son    Social History  Substance Use Topics  . Smoking status: Current Every Day Smoker -- 0.50 packs/day for 17 years    Types: Cigarettes  . Smokeless tobacco: Never Used  . Alcohol Use: Yes     Comment: unsure amount (1/6) - states generally does not drink   OB History    Gravida Para Term Preterm AB TAB SAB Ectopic Multiple Living   1 1 1       1      Review of Systems   Musculoskeletal: Positive for joint swelling and arthralgias.  Neurological: Negative for numbness.      Allergies  Orange oil; Carisoprodol; and Piroxicam  Home Medications   Prior to Admission medications   Medication Sig Start Date End Date Taking? Authorizing Provider  albuterol (PROVENTIL HFA;VENTOLIN HFA) 108 (90 BASE) MCG/ACT inhaler Inhale 1-2 puffs into the lungs every 4 (four) hours as needed for wheezing or shortness of breath. 03/26/14   Marisa Severinlga Otter, MD  ALPRAZolam Prudy Feeler(XANAX) 0.5 MG tablet Take 1 tablet (0.5 mg total) by mouth at bedtime as needed for anxiety. 03/20/14   Mellody DrownLauren Parker, PA-C  ARIPiprazole (ABILIFY) 5 MG tablet Take 1 tablet (5 mg total) by mouth daily. For major depressive disorder. 06/13/13   Tamala JulianNeil T Mashburn, PA-C  Erythromycin 2 % ointment Apply to affected area 2 times daily 03/20/14   Mellody DrownLauren Parker, PA-C  gabapentin (NEURONTIN) 300 MG capsule Take 600-1,200 mg by mouth 3 (three) times daily. 600mg  in the morning and evening and 1200mg  at bedtime.    Historical Provider, MD  HYDROcodone-acetaminophen (NORCO/VICODIN) 5-325 MG tablet Take 1 tablet by mouth every 4 (four) hours as needed. 04/06/16   Eyvonne MechanicJeffrey Thierno Hun, PA-C  lamoTRIgine (LAMICTAL) 200 MG tablet Take 1 tablet (200 mg total)  by mouth daily. For depression and mood stabilization. 06/13/13   Tamala Julian, PA-C  sertraline (ZOLOFT) 100 MG tablet Take 2 tablets (200 mg total) by mouth daily. For depression and anxiety. 06/13/13   Tamala Julian, PA-C  traZODone (DESYREL) 100 MG tablet Take 200 mg by mouth at bedtime. For insomnia. 06/13/13   Tamala Julian, PA-C   BP 124/92 mmHg  Pulse 102  Temp(Src) 98.3 F (36.8 C) (Oral)  Resp 16  SpO2 98%  LMP 03/31/2016 (Approximate) Physical Exam  Constitutional: She is oriented to person, place, and time. She appears well-developed and well-nourished. No distress.  HENT:  Head: Normocephalic and atraumatic.  Eyes: Conjunctivae and EOM are normal.  Neck: Neck supple.  No tracheal deviation present.  Cardiovascular: Normal rate.   Pulmonary/Chest: Effort normal. No respiratory distress.  Musculoskeletal: Normal range of motion.  Patient has obvious swelling to the lateral aspect of the right ankle. She has tenderness to palpation of the lateral malleolus and proximal foot. She has no significant tenderness to palpation of the remainder of lower extremity including fibular head   Patient has superficial abrasions to the left foot, minor tenderness to palpation of the medial ankle no significant swelling or edema sensation intact to bilateral lower extremities, pupils 2+   Neurological: She is alert and oriented to person, place, and time.  Skin: Skin is warm and dry.  Psychiatric: She has a normal mood and affect. Her behavior is normal.  Nursing note and vitals reviewed.   ED Course  Procedures (including critical care time)  DIAGNOSTIC STUDIES: Oxygen Saturation is 98% on RA, normal by my interpretation.    COORDINATION OF CARE: 3:17 PM - Patient made aware of XR results. Discussed treatment plan with pt at bedside which includes RICE protocol. Will place ankle in a splint. Advised ibuprofen prn to reduce inflammation. Pt verbalized understanding and agreed to plan.   Imaging Review Dg Ankle Complete Left  04/06/2016  CLINICAL DATA:  Recent fall with left ankle abrasions medially EXAM: LEFT ANKLE COMPLETE - 3+ VIEW COMPARISON:  09/30/2011 FINDINGS: There is no evidence of fracture, dislocation, or joint effusion. There is no evidence of arthropathy or other focal bone abnormality. Soft tissues are unremarkable. IMPRESSION: No acute abnormality noted. Electronically Signed   By: Alcide Clever M.D.   On: 04/06/2016 15:01   Dg Ankle Complete Right  04/06/2016  CLINICAL DATA:  Fall down concrete stairs with right ankle pain and swelling, initial encounter EXAM: RIGHT ANKLE - COMPLETE 3+ VIEW COMPARISON:  None. FINDINGS: Significant soft tissue swelling is  noted laterally. No acute fracture or dislocation is noted. No other soft tissue abnormality is seen. IMPRESSION: Lateral soft tissue swelling without acute bony abnormality. Electronically Signed   By: Alcide Clever M.D.   On: 04/06/2016 14:59   I have personally reviewed and evaluated these images as part of my medical decision-making.  MDM   Final diagnoses:  Sprained ankle, right, initial encounter   Labs:  Imaging:  Consults:  Therapeutics: Zofran-ODT 4 mg tablet, Percocet 5-325 mg tablet   Discharge Meds:   Assessment/Plan:  Patient X-Ray negative for obvious fracture or dislocation. Pain managed in ED. Pt advised to follow up with orthopedics if symptoms persist for possibility of missed fracture diagnosis. Patient given brace while in ED, conservative therapy recommended and discussed. Patient will be dc home & is agreeable with above plan.     I personally performed the services described in this documentation, which  was scribed in my presence. The recorded information has been reviewed and is accurate.     Eyvonne MechanicJeffrey Cloteal Isaacson, PA-C 04/06/16 1706  Doug SouSam Jacubowitz, MD 04/06/16 [phone]

## 2016-04-06 NOTE — ED Notes (Signed)
Patient transported to X-ray 

## 2016-04-06 NOTE — Discharge Instructions (Signed)
Please apply ICE use ibuprofen,  Crutches and follow-up with orthopedic specialist for further evaluation if symptoms persist   Ankle Sprain An ankle sprain is an injury to the strong, fibrous tissues (ligaments) that hold the bones of your ankle joint together.  CAUSES An ankle sprain is usually caused by a fall or by twisting your ankle. Ankle sprains most commonly occur when you step on the outer edge of your foot, and your ankle turns inward. People who participate in sports are more prone to these types of injuries.  SYMPTOMS   Pain in your ankle. The pain may be present at rest or only when you are trying to stand or walk.  Swelling.  Bruising. Bruising may develop immediately or within 1 to 2 days after your injury.  Difficulty standing or walking, particularly when turning corners or changing directions. DIAGNOSIS  Your caregiver will ask you details about your injury and perform a physical exam of your ankle to determine if you have an ankle sprain. During the physical exam, your caregiver will press on and apply pressure to specific areas of your foot and ankle. Your caregiver will try to move your ankle in certain ways. An X-ray exam may be done to be sure a bone was not broken or a ligament did not separate from one of the bones in your ankle (avulsion fracture).  TREATMENT  Certain types of braces can help stabilize your ankle. Your caregiver can make a recommendation for this. Your caregiver may recommend the use of medicine for pain. If your sprain is severe, your caregiver may refer you to a surgeon who helps to restore function to parts of your skeletal system (orthopedist) or a physical therapist. HOME CARE INSTRUCTIONS   Apply ice to your injury for 1-2 days or as directed by your caregiver. Applying ice helps to reduce inflammation and pain.  Put ice in a plastic bag.  Place a towel between your skin and the bag.  Leave the ice on for 15-20 minutes at a time, every 2  hours while you are awake.  Only take over-the-counter or prescription medicines for pain, discomfort, or fever as directed by your caregiver.  Elevate your injured ankle above the level of your heart as much as possible for 2-3 days.  If your caregiver recommends crutches, use them as instructed. Gradually put weight on the affected ankle. Continue to use crutches or a cane until you can walk without feeling pain in your ankle.  If you have a plaster splint, wear the splint as directed by your caregiver. Do not rest it on anything harder than a pillow for the first 24 hours. Do not put weight on it. Do not get it wet. You may take it off to take a shower or bath.  You may have been given an elastic bandage to wear around your ankle to provide support. If the elastic bandage is too tight (you have numbness or tingling in your foot or your foot becomes cold and blue), adjust the bandage to make it comfortable.  If you have an air splint, you may blow more air into it or let air out to make it more comfortable. You may take your splint off at night and before taking a shower or bath. Wiggle your toes in the splint several times per day to decrease swelling. SEEK MEDICAL CARE IF:   You have rapidly increasing bruising or swelling.  Your toes feel extremely cold or you lose feeling in your foot.  Your pain is not relieved with medicine. SEEK IMMEDIATE MEDICAL CARE IF:  Your toes are numb or blue.  You have severe pain that is increasing. MAKE SURE YOU:   Understand these instructions.  Will watch your condition.  Will get help right away if you are not doing well or get worse.   This information is not intended to replace advice given to you by your health care provider. Make sure you discuss any questions you have with your health care provider.   Document Released: 09/27/2005 Document Revised: 10/18/2014 Document Reviewed: 10/09/2011 Elsevier Interactive Patient Education [id number]  [employer]

## 2017-01-17 ENCOUNTER — Encounter (HOSPITAL_COMMUNITY): Payer: Self-pay | Admitting: Emergency Medicine

## 2017-01-17 ENCOUNTER — Other Ambulatory Visit: Payer: Self-pay

## 2017-01-17 ENCOUNTER — Observation Stay (HOSPITAL_COMMUNITY)
Admission: EM | Admit: 2017-01-17 | Discharge: 2017-01-18 | Disposition: A | Discharge status: Home / Self Care | Payer: Medicaid Other | Attending: Oncology | Admitting: Oncology

## 2017-01-17 ENCOUNTER — Emergency Department (HOSPITAL_COMMUNITY): Payer: Medicaid Other

## 2017-01-17 DIAGNOSIS — Z79899 Other long term (current) drug therapy: Secondary | ICD-10-CM | POA: Diagnosis not present

## 2017-01-17 DIAGNOSIS — Z91048 Other nonmedicinal substance allergy status: Secondary | ICD-10-CM | POA: Diagnosis not present

## 2017-01-17 DIAGNOSIS — Z886 Allergy status to analgesic agent status: Secondary | ICD-10-CM | POA: Diagnosis not present

## 2017-01-17 DIAGNOSIS — J209 Acute bronchitis, unspecified: Secondary | ICD-10-CM | POA: Diagnosis not present

## 2017-01-17 DIAGNOSIS — R05 Cough: Secondary | ICD-10-CM | POA: Diagnosis present

## 2017-01-17 DIAGNOSIS — E139 Other specified diabetes mellitus without complications: Secondary | ICD-10-CM | POA: Insufficient documentation

## 2017-01-17 DIAGNOSIS — R0902 Hypoxemia: Secondary | ICD-10-CM | POA: Diagnosis present

## 2017-01-17 DIAGNOSIS — E669 Obesity, unspecified: Secondary | ICD-10-CM | POA: Insufficient documentation

## 2017-01-17 DIAGNOSIS — Z8249 Family history of ischemic heart disease and other diseases of the circulatory system: Secondary | ICD-10-CM

## 2017-01-17 DIAGNOSIS — Z888 Allergy status to other drugs, medicaments and biological substances status: Secondary | ICD-10-CM | POA: Insufficient documentation

## 2017-01-17 DIAGNOSIS — J40 Bronchitis, not specified as acute or chronic: Secondary | ICD-10-CM

## 2017-01-17 DIAGNOSIS — Z8261 Family history of arthritis: Secondary | ICD-10-CM | POA: Insufficient documentation

## 2017-01-17 DIAGNOSIS — Z825 Family history of asthma and other chronic lower respiratory diseases: Secondary | ICD-10-CM | POA: Insufficient documentation

## 2017-01-17 DIAGNOSIS — G894 Chronic pain syndrome: Secondary | ICD-10-CM | POA: Diagnosis not present

## 2017-01-17 DIAGNOSIS — J069 Acute upper respiratory infection, unspecified: Secondary | ICD-10-CM | POA: Diagnosis not present

## 2017-01-17 DIAGNOSIS — F419 Anxiety disorder, unspecified: Secondary | ICD-10-CM | POA: Insufficient documentation

## 2017-01-17 DIAGNOSIS — Z91018 Allergy to other foods: Secondary | ICD-10-CM

## 2017-01-17 DIAGNOSIS — F429 Obsessive-compulsive disorder, unspecified: Secondary | ICD-10-CM | POA: Diagnosis not present

## 2017-01-17 DIAGNOSIS — J208 Acute bronchitis due to other specified organisms: Secondary | ICD-10-CM

## 2017-01-17 DIAGNOSIS — Z6834 Body mass index (BMI) 34.0-34.9, adult: Secondary | ICD-10-CM | POA: Diagnosis not present

## 2017-01-17 DIAGNOSIS — F17210 Nicotine dependence, cigarettes, uncomplicated: Secondary | ICD-10-CM | POA: Insufficient documentation

## 2017-01-17 DIAGNOSIS — F319 Bipolar disorder, unspecified: Secondary | ICD-10-CM | POA: Insufficient documentation

## 2017-01-17 DIAGNOSIS — Z8701 Personal history of pneumonia (recurrent): Secondary | ICD-10-CM

## 2017-01-17 DIAGNOSIS — Z833 Family history of diabetes mellitus: Secondary | ICD-10-CM | POA: Insufficient documentation

## 2017-01-17 DIAGNOSIS — R0602 Shortness of breath: Secondary | ICD-10-CM

## 2017-01-17 LAB — CBC WITH DIFFERENTIAL/PLATELET
Basophils Absolute: 0 10*3/uL (ref 0.0–0.1)
Basophils Relative: 0 %
Eosinophils Absolute: 0.5 10*3/uL (ref 0.0–0.7)
Eosinophils Relative: 3 %
HCT: 40.1 % (ref 36.0–46.0)
Hemoglobin: 13.8 g/dL (ref 12.0–15.0)
Lymphocytes Relative: 15 %
Lymphs Abs: 2.3 10*3/uL (ref 0.7–4.0)
MCH: 30.7 pg (ref 26.0–34.0)
MCHC: 34.4 g/dL (ref 30.0–36.0)
MCV: 89.3 fL (ref 78.0–100.0)
Monocytes Absolute: 1.1 10*3/uL — ABNORMAL HIGH (ref 0.1–1.0)
Monocytes Relative: 7 %
Neutro Abs: 11.4 10*3/uL — ABNORMAL HIGH (ref 1.7–7.7)
Neutrophils Relative %: 75 %
Platelets: 311 10*3/uL (ref 150–400)
RBC: 4.49 MIL/uL (ref 3.87–5.11)
RDW: 13 % (ref 11.5–15.5)
WBC: 15.3 10*3/uL — ABNORMAL HIGH (ref 4.0–10.5)

## 2017-01-17 LAB — BASIC METABOLIC PANEL
Anion gap: 9 (ref 5–15)
BUN: <5 mg/dL — ABNORMAL LOW (ref 6–20)
CO2: 24 mmol/L (ref 22–32)
Calcium: 8.8 mg/dL — ABNORMAL LOW (ref 8.9–10.3)
Chloride: 100 mmol/L — ABNORMAL LOW (ref 101–111)
Creatinine, Ser: 0.69 mg/dL (ref 0.44–1.00)
GFR calc Af Amer: >60 mL/min (ref >60)
GFR calc non Af Amer: >60 mL/min (ref >60)
Glucose, Bld: 215 mg/dL — ABNORMAL HIGH (ref 65–99)
Potassium: 3.8 mmol/L (ref 3.5–5.1)
Sodium: 133 mmol/L — ABNORMAL LOW (ref 135–145)

## 2017-01-17 LAB — I-STAT TROPONIN, ED: Troponin i, poc: 0 ng/mL (ref 0.00–0.08)

## 2017-01-17 LAB — BRAIN NATRIURETIC PEPTIDE: B Natriuretic Peptide: 20.3 pg/mL (ref 0.0–100.0)

## 2017-01-17 LAB — D-DIMER, QUANTITATIVE: D-Dimer, Quant: 0.45 ug/mL-FEU (ref 0.00–0.50)

## 2017-01-17 MED ORDER — ALBUTEROL SULFATE (2.5 MG/3ML) 0.083% IN NEBU: 2.5000 mg | INHALATION_SOLUTION | RESPIRATORY_TRACT | Status: DC | PRN

## 2017-01-17 MED ORDER — HYDROMORPHONE HCL 1 MG/ML IJ SOLN
0.7500 mg | Freq: Once | INTRAMUSCULAR | Status: AC
  Administered 2017-01-17: 0.75 mg via INTRAVENOUS
  Filled 2017-01-17: qty 1

## 2017-01-17 MED ORDER — KETOROLAC TROMETHAMINE 15 MG/ML IJ SOLN
15.0000 mg | Freq: Once | INTRAMUSCULAR | Status: AC
  Administered 2017-01-17: 15 mg via INTRAVENOUS
  Filled 2017-01-17: qty 1

## 2017-01-17 MED ORDER — SERTRALINE HCL 100 MG PO TABS
200.0000 mg | ORAL_TABLET | Freq: Every day | ORAL | Status: DC
  Administered 2017-01-18: 200 mg via ORAL
  Filled 2017-01-17: qty 2

## 2017-01-17 MED ORDER — ENOXAPARIN SODIUM 40 MG/0.4ML ~~LOC~~ SOLN
40.0000 mg | SUBCUTANEOUS | Status: DC
  Administered 2017-01-17: 40 mg via SUBCUTANEOUS
  Filled 2017-01-17: qty 0.4

## 2017-01-17 MED ORDER — IPRATROPIUM-ALBUTEROL 0.5-2.5 (3) MG/3ML IN SOLN
3.0000 mL | Freq: Once | RESPIRATORY_TRACT | Status: AC
  Administered 2017-01-17: 3 mL via RESPIRATORY_TRACT
  Filled 2017-01-17: qty 3

## 2017-01-17 MED ORDER — NICOTINE 21 MG/24HR TD PT24
21.0000 mg | MEDICATED_PATCH | Freq: Every day | TRANSDERMAL | Status: DC
Start: 2017-01-17 — End: 2017-01-18
  Administered 2017-01-18: 21 mg via TRANSDERMAL
  Filled 2017-01-17: qty 1

## 2017-01-17 MED ORDER — ALBUTEROL (5 MG/ML) CONTINUOUS INHALATION SOLN
15.0000 mg/h | INHALATION_SOLUTION | RESPIRATORY_TRACT | Status: DC
  Administered 2017-01-17: 15 mg/h via RESPIRATORY_TRACT
  Filled 2017-01-17: qty 20

## 2017-01-17 MED ORDER — ACETAMINOPHEN 325 MG PO TABS
650.0000 mg | ORAL_TABLET | Freq: Once | ORAL | Status: AC
  Administered 2017-01-17: 650 mg via ORAL
  Filled 2017-01-17: qty 2

## 2017-01-17 MED ORDER — ACETAMINOPHEN 325 MG PO TABS: 650.0000 mg | ORAL_TABLET | Freq: Four times a day (QID) | ORAL | Status: DC | PRN

## 2017-01-17 MED ORDER — ARIPIPRAZOLE 5 MG PO TABS
5.0000 mg | ORAL_TABLET | Freq: Every day | ORAL | Status: DC
  Administered 2017-01-18: 5 mg via ORAL
  Filled 2017-01-17: qty 1

## 2017-01-17 MED ORDER — GABAPENTIN 400 MG PO CAPS
1200.0000 mg | ORAL_CAPSULE | Freq: Every evening | ORAL | Status: DC
  Administered 2017-01-17: 1200 mg via ORAL
  Filled 2017-01-17: qty 3

## 2017-01-17 MED ORDER — METHYLPREDNISOLONE SODIUM SUCC 125 MG IJ SOLR
125.0000 mg | Freq: Once | INTRAMUSCULAR | Status: AC
  Administered 2017-01-17: 125 mg via INTRAVENOUS
  Filled 2017-01-17: qty 2

## 2017-01-17 MED ORDER — IPRATROPIUM-ALBUTEROL 0.5-2.5 (3) MG/3ML IN SOLN
3.0000 mL | Freq: Four times a day (QID) | RESPIRATORY_TRACT | Status: DC
  Administered 2017-01-17: 3 mL via RESPIRATORY_TRACT
  Filled 2017-01-17: qty 3

## 2017-01-17 MED ORDER — ACETAMINOPHEN 650 MG RE SUPP: 650.0000 mg | Freq: Four times a day (QID) | RECTAL | Status: DC | PRN

## 2017-01-17 MED ORDER — GABAPENTIN 300 MG PO CAPS
600.0000 mg | ORAL_CAPSULE | Freq: Every morning | ORAL | Status: DC
  Administered 2017-01-18: 600 mg via ORAL
  Filled 2017-01-17: qty 2

## 2017-01-17 MED ORDER — IPRATROPIUM-ALBUTEROL 0.5-2.5 (3) MG/3ML IN SOLN
3.0000 mL | Freq: Two times a day (BID) | RESPIRATORY_TRACT | Status: DC
  Administered 2017-01-18: 3 mL via RESPIRATORY_TRACT
  Filled 2017-01-17: qty 3

## 2017-01-17 MED ORDER — LAMOTRIGINE 100 MG PO TABS
200.0000 mg | ORAL_TABLET | Freq: Every day | ORAL | Status: DC
  Administered 2017-01-18: 200 mg via ORAL
  Filled 2017-01-17: qty 1
  Filled 2017-01-17: qty 2

## 2017-01-17 MED ORDER — TRAZODONE HCL 100 MG PO TABS
200.0000 mg | ORAL_TABLET | Freq: Every day | ORAL | Status: DC
  Administered 2017-01-17: 200 mg via ORAL
  Filled 2017-01-17: qty 2

## 2017-01-17 MED ORDER — ALBUTEROL SULFATE (2.5 MG/3ML) 0.083% IN NEBU
5.0000 mg | INHALATION_SOLUTION | Freq: Once | RESPIRATORY_TRACT | Status: AC
  Administered 2017-01-17: 5 mg via RESPIRATORY_TRACT
  Filled 2017-01-17: qty 6

## 2017-01-17 MED ORDER — GABAPENTIN 300 MG PO CAPS: 300.0000 mg | ORAL_CAPSULE | Freq: Two times a day (BID) | ORAL | Status: DC

## 2017-01-17 NOTE — ED Triage Notes (Signed)
Pt states she has been feeling SOB for two days and having a cough. Developed CP yesterday. Today these symptoms worsened.

## 2017-01-17 NOTE — ED Notes (Signed)
Called patient placement and updated on delays

## 2017-01-17 NOTE — ED Notes (Signed)
Called respiratory to come and begin pt continuous neb.

## 2017-01-17 NOTE — ED Provider Notes (Signed)
[facility]-EMERGENCY DEPT Provider Note   CSN: [id number] Arrival date & time: 01/17/17  0802     History   Chief Complaint Chief Complaint  Patient presents with  . Shortness of Breath  . Chest Pain    HPI [name] is a 36 y.o. female.  HPI   36 year old female with dyspnea. Gradual onset about 2 days ago. Progressively worsening since then. Symptoms are constant. Worse with exertion. She feels better when supine because "I feel more relaxed." Cough is nonproductive. Subjective fever & bodyaches. No unusual leg pain or swelling. She's been taking over-the-counter cold medications without significant relief. Denies any known history of underlying lung disease. She is a smoker.  Past Medical History:  Diagnosis Date  . Anxiety   . Back pain   . CAP (community acquired pneumonia)   . Chronic pain syndrome   . Depression   . Gallbladder problem   . Obsessive compulsive disorder     Patient Active Problem List   Diagnosis Date Noted  . Bipolar I disorder, most recent episode depressed (HCC) 06/11/2013  . Depression with suicidal ideation 06/09/2013  . Alcohol abuse, episodic drinking behavior 06/09/2013  . Major depressive disorder, recurrent episode, severe, specified as with psychotic behavior 06/09/2013  . 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 SNORING 07/18/2009  . OVARIAN CYST 06/13/2009  . HYPERCHOLESTEROLEMIA 09/19/2008  . BACK PAIN 09/18/2008  . TOBACCO ABUSE 08/13/2008  . INSOMNIA 08/13/2008    Past Surgical History:  Procedure Laterality Date  . CESAREAN SECTION    . CHOLECYSTECTOMY  2009  . wisdom teeth extraction      OB History    Gravida Para Term Preterm AB Living   SAB TAB Ectopic Multiple Live Births           1       Home Medications    Prior to Admission medications   Medication Sig Start Date End Date Taking? Authorizing Provider  albuterol (PROVENTIL  HFA;VENTOLIN HFA) 108 (90 BASE) MCG/ACT inhaler Inhale 1-2 puffs into the lungs every 4 (four) hours as needed for wheezing or shortness of breath. 03/26/14   Marisa Severin, MD  ALPRAZolam Prudy Feeler) 0.5 MG tablet Take 1 tablet (0.5 mg total) by mouth at bedtime as needed for anxiety. 03/20/14   Mellody Drown, PA-C  ARIPiprazole (ABILIFY) 5 MG tablet Take 1 tablet (5 mg total) by mouth daily. For major depressive disorder. 06/13/13   Tamala Julian, PA-C  Erythromycin 2 % ointment Apply to affected area 2 times daily 03/20/14   Mellody Drown, PA-C  gabapentin (NEURONTIN) 300 MG capsule Take 600-1,200 mg by mouth 3 (three) times daily.  in the morning and evening and  at bedtime.    Historical Provider, MD  HYDROcodone-acetaminophen (NORCO/VICODIN) 5-325 MG tablet Take 1 tablet by mouth every 4 (four) hours as needed. 04/06/16   Eyvonne Mechanic, PA-C  lamoTRIgine (LAMICTAL) 200 MG tablet Take 1 tablet (200 mg total) by mouth daily. For depression and mood stabilization. 06/13/13   Tamala Julian, PA-C  sertraline (ZOLOFT) 100 MG tablet Take 2 tablets (200 mg total) by mouth daily. For depression and anxiety. 06/13/13   Tamala Julian, PA-C  traZODone (DESYREL) 100 MG tablet Take 200 mg by mouth at bedtime. For insomnia. 06/13/13   Tamala Julian, PA-C    Family History Family History  Problem Relation Age of Onset  .  Diabetes insipidus Father   . Arthritis Father   . Heart failure Father   . COPD Father   . Emphysema Father   . Asthma Son     Social History Social History  Substance Use Topics  . Smoking status: Current Every Day Smoker    Packs/day: 0.50    Years: 17.00    Types: Cigarettes  . Smokeless tobacco: Never Used  . Alcohol use Yes     Comment: unsure amount (1/6) - states generally does not drink     Allergies   Orange oil; Carisoprodol; and Piroxicam   Review of Systems Review of Systems  All systems reviewed and negative, other than as noted in HPI.  Physical  Exam Updated Vital Signs BP 113/82 (BP Location: Left Arm)   Pulse (!) 111   Temp 99.3 F (37.4 C) (Oral)   Resp 19   Ht  (1.676 m)   Wt 215 lb (97.5 kg)   LMP 12/21/2016   SpO2 93%   BMI 34.70 kg/m   Physical Exam  Constitutional: She appears well-developed and well-nourished. No distress.  HENT:  Head: Normocephalic and atraumatic.  Eyes: Conjunctivae are normal. Right eye exhibits no discharge. Left eye exhibits no discharge.  Neck: Neck supple.  Cardiovascular: Normal rate, regular rhythm and normal heart sounds.  Exam reveals no gallop and no friction rub.   No murmur heard. Pulmonary/Chest: No respiratory distress. She has wheezes.  Abdominal: Soft. She exhibits no distension. There is no tenderness.  Musculoskeletal: She exhibits no edema or tenderness.  Lower extremities symmetric as compared to each other. No calf tenderness. Negative Homan's. No palpable cords.   Neurological: She is alert.  Skin: Skin is warm and dry.  Psychiatric: She has a normal mood and affect. Her behavior is normal. Thought content normal.  Nursing note and vitals reviewed.    ED Treatments / Results  Labs (all labs ordered are listed, but only abnormal results are displayed) Labs Reviewed  CBC WITH DIFFERENTIAL/PLATELET - Abnormal; Notable for the following:       Result Value   WBC 15.3 (*)    Neutro Abs 11.4 (*)    Monocytes Absolute 1.1 (*)    All other components within normal limits  BASIC METABOLIC PANEL - Abnormal; Notable for the following:    Sodium 133 (*)    Chloride 100 (*)    Glucose, Bld 215 (*)    BUN <5 (*)    Calcium 8.8 (*)    All other components within normal limits  CBC - Abnormal; Notable for the following:    WBC 15.6 (*)    All other components within normal limits  BASIC METABOLIC PANEL - Abnormal; Notable for the following:    Glucose, Bld 245 (*)    All other components within normal limits  BRAIN NATRIURETIC PEPTIDE  HIV ANTIBODY (ROUTINE  TESTING)  INFLUENZA PANEL BY PCR (TYPE A & B)  D-DIMER, QUANTITATIVE (NOT AT [facility])  I-STAT TROPOININ, ED    EKG  EKG Interpretation None       Radiology No results found.  Procedures Procedures (including critical care time)  Medications Ordered in ED Medications  ipratropium-albuterol (DUONEB) 0.5-2.5 (3) MG/3ML nebulizer solution 3 mL (not administered)  methylPREDNISolone sodium succinate (SOLU-MEDROL) 125 mg/2 mL injection 125 mg (not administered)  ketorolac (TORADOL) 15 MG/ML injection 15 mg (not administered)  acetaminophen (TYLENOL) tablet 650 mg (not administered)     Initial Impression / Assessment and Plan /  ED Course  I have reviewed the triage vital signs and the nursing notes.  Pertinent labs & imaging results that were available during my care of the patient were reviewed by me and considered in my medical decision making (see chart for details).     58y female with cough and shortness of breath. Significant wheezing on exam. Has nausea requirement. Given steroids and nebs. Will admit.   Final Clinical Impressions(s) / ED Diagnoses   Final diagnoses:  Shortness of breath  Bronchitis    New Prescriptions New Prescriptions   No medications on file     Raeford Razor, MD 01/26/17 1218

## 2017-01-17 NOTE — H&P (Signed)
Date: 01/17/2017               Patient Name:  [name] MRN: [id number]  DOB: 05/09/1981 Age / Sex: 36 y.o., female   PCP: No Pcp Per Patient         Medical Service: Internal Medicine Teaching Service         Attending Physician: Dr. Levert Feinstein, MD    First Contact: Dr. Mikey Bussing Pager: [phone]  Second Contact: Dr. Allena Katz  Pager: [phone]       After Hours (After 5p/  First Contact Pager: [phone]  weekends / holidays): Second Contact Pager: [phone]   Chief Complaint: Shortness of breath  History of Present Illness: [name] is a 36 year old With history of pneumonia and bipolar type I that presents to the ED with shortness of breath. She states she developed shortness of breath 2 days ago and has been worsening. She was walking in the [facility] today helping her uncle get to a procedure when she felt malaise.  She decided to go to the ED.  She has associated symptoms of sore throat, body aches, dry cough and chills. She denies any recent surgeries, is not on birth control, or recent travel. She denies any sick contacts. She denies any history of lung disease such as asthma or COPD. Patient is a 12-24 pack year smoker.  In the ED patient received DuoNeb's and Solu-Medrol with some relief in symptoms.  Meds:  Current Meds  Medication Sig  . ARIPiprazole (ABILIFY) 5 MG tablet Take 1 tablet (5 mg total) by mouth daily. For major depressive disorder.  . Brexpiprazole (REXULTI PO) Take 1 tablet by mouth daily. Dose unknown. ( pt gets at [facility])  . Erythromycin 2 % ointment Apply to affected area 2 times daily  . gabapentin (NEURONTIN) 300 MG capsule Take 300-600 mg by mouth 2 (two) times daily.  in the morning and evening and  at bedtime.   . lamoTRIgine (LAMICTAL) 200 MG tablet Take 1 tablet (200 mg total) by mouth daily. For depression and mood stabilization.  . sertraline (ZOLOFT) 100 MG tablet Take 2 tablets (200 mg total) by mouth  daily. For depression and anxiety.  . traZODone (DESYREL) 100 MG tablet Take 200 mg by mouth at bedtime. For insomnia.     Allergies: Allergies as of 01/17/2017 - Review Complete 01/17/2017  Allergen Reaction Noted  . Orange oil Shortness Of Breath 08/06/2011  . Carisoprodol  08/06/2011  . Piroxicam  08/06/2011   Past Medical History:  Diagnosis Date  . Anxiety   . Back pain   . CAP (community acquired pneumonia)   . Chronic pain syndrome   . Depression   . Gallbladder problem   . Obsessive compulsive disorder     Family History:  Family History  Problem Relation Age of Onset  . Diabetes insipidus Father   . Arthritis Father   . Heart failure Father   . COPD Father   . Emphysema Father   . Asthma Son      Social History: Tobacco use: 1/2 pack per day smoker for the past 23 years Alcohol use: denies Illicit drug use:  denies  Review of Systems: A complete ROS was negative except as per HPI.   Physical Exam: Blood pressure 126/73, pulse (!) 121, temperature 98.2 F (36.8 C), resp. rate (!) 24, height  (1.676 m), weight 215 lb (97.5 kg), last menstrual period 12/21/2016, SpO2 100 %.  Physical Exam  Constitutional: She is well-developed, well-nourished, and in no distress.  Cardiovascular: Normal rate, regular rhythm and normal heart sounds.  Exam reveals no gallop and no friction rub.   No murmur heard. Pulmonary/Chest: Effort normal. No respiratory distress. She has wheezes.  Diffuse rhonchi throughout all lung fields   Abdominal: Soft. She exhibits no distension. There is no tenderness.  Musculoskeletal: She exhibits no edema.  No calf tenderness  Neurological: She is alert.  Skin: Skin is warm and dry.  Psychiatric: Mood and affect normal.    EKG: Sinus tachycardia   CXR: Impression: Bronchitic changes without infiltrate  CBC    Component Value Date/Time   WBC 15.3 (H) 01/17/2017 0820   RBC 4.49 01/17/2017 0820   HGB 13.8 01/17/2017 0820   HCT  40.1 01/17/2017 0820   PLT 311 01/17/2017 0820   MCV 89.3 01/17/2017 0820   MCH 30.7 01/17/2017 0820   MCHC 34.4 01/17/2017 0820   RDW 13.0 01/17/2017 0820   LYMPHSABS 2.3 01/17/2017 0820   MONOABS 1.1 (H) 01/17/2017 0820   EOSABS 0.5 01/17/2017 0820   BASOSABS 0.0 01/17/2017 0820   BMET    Component Value Date/Time   NA 133 (L) 01/17/2017 0820   K 3.8 01/17/2017 0820   CL 100 (L) 01/17/2017 0820   CO2 24 01/17/2017 0820   GLUCOSE 215 (H) 01/17/2017 0820   BUN <5 (L) 01/17/2017 0820   CREATININE 0.69 01/17/2017 0820   CALCIUM 8.8 (L) 01/17/2017 0820   GFRNONAA >60 01/17/2017 0820   GFRAA >60 01/17/2017 0820     Assessment & Plan by Problem: Active Problems:   Hypoxia In the ED patient became hypoxic to the mid 80s. EKG showed sinus tach. Per history Wells criteria was low probability for PE. Will get a d-dimer. Patient describes a 2 day history of worsening cough and shortness of breath. She also has sore throat and body aches. She had diffuse wheezing and rhonchi on lung exam. Patient is afebrile with a leukocytosis.  This is likely from a viral infection.  Will get a flu swab.  Patient denies any history of asthma or COPD. Chest x-ray shows bronchitic changes without infiltrate. Patient may benefit from PFTs outpatient.   - Duonebs Q6H - albuterol prn - D-dimer - pulse ox   History of bipolar type I and depression Patient takes Abilify 5 mg, Lamictal 200 mg and Zoloft 200 mg -Continue home psych meds  Tobacco abuse Patient has a 12-24 pack year smoking history.  Patient may benefit from PFTs outpatient -Nicotine patch   Dispo: Admit patient to Observation with expected length of stay less than 2 midnights.  Signed: Camelia Phenes, DO 01/17/2017, 6:17 PM  Pager: [phone]

## 2017-01-17 NOTE — ED Notes (Signed)
Phlebotomy at bedside.

## 2017-01-18 DIAGNOSIS — R0902 Hypoxemia: Secondary | ICD-10-CM

## 2017-01-18 LAB — CBC
HCT: 37.6 % (ref 36.0–46.0)
Hemoglobin: 12.6 g/dL (ref 12.0–15.0)
MCH: 30.4 pg (ref 26.0–34.0)
MCHC: 33.5 g/dL (ref 30.0–36.0)
MCV: 90.6 fL (ref 78.0–100.0)
Platelets: 329 10*3/uL (ref 150–400)
RBC: 4.15 MIL/uL (ref 3.87–5.11)
RDW: 13.1 % (ref 11.5–15.5)
WBC: 15.6 10*3/uL — ABNORMAL HIGH (ref 4.0–10.5)

## 2017-01-18 LAB — BASIC METABOLIC PANEL
Anion gap: 9 (ref 5–15)
BUN: 8 mg/dL (ref 6–20)
CO2: 25 mmol/L (ref 22–32)
Calcium: 8.9 mg/dL (ref 8.9–10.3)
Chloride: 103 mmol/L (ref 101–111)
Creatinine, Ser: 0.79 mg/dL (ref 0.44–1.00)
GFR calc Af Amer: >60 mL/min (ref >60)
GFR calc non Af Amer: >60 mL/min (ref >60)
Glucose, Bld: 245 mg/dL — ABNORMAL HIGH (ref 65–99)
Potassium: 3.9 mmol/L (ref 3.5–5.1)
Sodium: 137 mmol/L (ref 135–145)

## 2017-01-18 LAB — INFLUENZA PANEL BY PCR (TYPE A & B)
Influenza A By PCR: NEGATIVE
Influenza B By PCR: NEGATIVE

## 2017-01-18 LAB — HIV ANTIBODY (ROUTINE TESTING W REFLEX): HIV Screen 4th Generation wRfx: NONREACTIVE

## 2017-01-18 MED ORDER — ALBUTEROL SULFATE HFA 108 (90 BASE) MCG/ACT IN AERS: 1.0000 | INHALATION_SPRAY | Freq: Four times a day (QID) | RESPIRATORY_TRACT | 0 refills | Status: DC | PRN

## 2017-01-18 NOTE — Progress Notes (Signed)
Nsg Discharge Note  Admit Date:  01/17/2017 Discharge date: 01/18/2017   [name] to be D/C'd Home per MD order.  AVS completed.  Copy for chart, and copy for patient signed, and dated. Patient/caregiver able to verbalize understanding.  Discharge Medication: Allergies as of 01/18/2017      Reactions   Orange Oil Shortness Of Breath   Carisoprodol    Skin peeled   Piroxicam    Skin peeled      Medication List    TAKE these medications   albuterol 108 (90 Base) MCG/ACT inhaler Commonly known as:  PROVENTIL HFA;VENTOLIN HFA Inhale 1-2 puffs into the lungs every 6 (six) hours as needed for wheezing or shortness of breath. What changed:  when to take this   ALPRAZolam 0.5 MG tablet Commonly known as:  XANAX Take 1 tablet (0.5 mg total) by mouth at bedtime as needed for anxiety.   ARIPiprazole 5 MG tablet Commonly known as:  ABILIFY Take 1 tablet (5 mg total) by mouth daily. For major depressive disorder.   Erythromycin 2 % ointment Apply to affected area 2 times daily   gabapentin 300 MG capsule Commonly known as:  NEURONTIN Take 300-600 mg by mouth 2 (two) times daily.  in the morning and evening and  at bedtime.   HYDROcodone-acetaminophen 5-325 MG tablet Commonly known as:  NORCO/VICODIN Take 1 tablet by mouth every 4 (four) hours as needed.   lamoTRIgine 200 MG tablet Commonly known as:  LAMICTAL Take 1 tablet (200 mg total) by mouth daily. For depression and mood stabilization.   REXULTI PO Take 1 tablet by mouth daily. Dose unknown. ( pt gets at [facility])   sertraline 100 MG tablet Commonly known as:  ZOLOFT Take 2 tablets (200 mg total) by mouth daily. For depression and anxiety.   traZODone 100 MG tablet Commonly known as:  DESYREL Take 200 mg by mouth at bedtime. For insomnia.       Discharge Assessment: Vitals:   01/17/17 2208 01/18/17 0506  BP: (!) 112/51 116/77  Pulse: (!) 114 95  Resp:  18  Temp: 98.7 F (37.1 C) 98  F (36.7 C)   Skin clean, dry and intact without evidence of skin break down, no evidence of skin tears noted. IV catheter discontinued intact. Site without signs and symptoms of complications - no redness or edema noted at insertion site, patient denies c/o pain - only slight tenderness at site.  Dressing with slight pressure applied.  D/c Instructions-Education: Discharge instructions given to patient/family with verbalized understanding. D/c education completed with patient/family including follow up instructions, medication list, d/c activities limitations if indicated, with other d/c instructions as indicated by MD - patient able to verbalize understanding, all questions fully answered. Patient instructed to return to ED, call 911, or call MD for any changes in condition.  Patient escorted via WC, and D/C home via private auto.  Camillo Flaming, RN 01/18/2017 4:05 PM

## 2017-01-18 NOTE — Progress Notes (Signed)
Pt. requesting Neurontin and Trazadone and Dr. Ladona Ridgel paged with RTC.

## 2017-01-18 NOTE — Discharge Summary (Signed)
Name: [name] MRN: [id number] DOB: 1980/10/27 36 y.o. PCP: No Pcp Per Patient  Date of Admission: 01/17/2017  8:53 AM Date of Discharge: 01/18/2017 Attending Physician: Levert Feinstein, MD  Discharge Diagnosis: 1. Viral upper respiratory tract infection 2. Hypoxia  Active Problems:   Hypoxia   Discharge Medications: Allergies as of 01/18/2017      Reactions   Orange Oil Shortness Of Breath   Carisoprodol    Skin peeled   Piroxicam    Skin peeled      Medication List    TAKE these medications   albuterol 108 (90 Base) MCG/ACT inhaler Commonly known as:  PROVENTIL HFA;VENTOLIN HFA Inhale 1-2 puffs into the lungs every 6 (six) hours as needed for wheezing or shortness of breath. What changed:  when to take this   ALPRAZolam 0.5 MG tablet Commonly known as:  XANAX Take 1 tablet (0.5 mg total) by mouth at bedtime as needed for anxiety.   ARIPiprazole 5 MG tablet Commonly known as:  ABILIFY Take 1 tablet (5 mg total) by mouth daily. For major depressive disorder.   Erythromycin 2 % ointment Apply to affected area 2 times daily   gabapentin 300 MG capsule Commonly known as:  NEURONTIN Take 300-600 mg by mouth 2 (two) times daily.  in the morning and evening and  at bedtime.   HYDROcodone-acetaminophen 5-325 MG tablet Commonly known as:  NORCO/VICODIN Take 1 tablet by mouth every 4 (four) hours as needed.   lamoTRIgine 200 MG tablet Commonly known as:  LAMICTAL Take 1 tablet (200 mg total) by mouth daily. For depression and mood stabilization.   REXULTI PO Take 1 tablet by mouth daily. Dose unknown. ( pt gets at [facility])   sertraline 100 MG tablet Commonly known as:  ZOLOFT Take 2 tablets (200 mg total) by mouth daily. For depression and anxiety.   traZODone 100 MG tablet Commonly known as:  DESYREL Take 200 mg by mouth at bedtime. For insomnia.       Disposition and follow-up:   [name] was discharged  from [facility] in good condition.  At the hospital follow up visit please address:  1.  Assess need for pulmonary function tests  2.  Labs / imaging needed at time of follow-up: PFTs  3.  Pending labs/ test needing follow-up: none  Follow-up Appointments:   Hospital Course by problem list: Active Problems:   Hypoxia   Viral upper respiratory tract infection Patient was admitted with a 2 day history of worsening cough and shortness of breath. She also had a sore throat and body aches. She had diffuse wheezing and rhonchi on lung exam. In the ED patient became hypoxic to the mid 80s. EKG showed sinus tach. Per history Wells criteria was low probability for PE. D-dimer was obtained and was low at 0.45. Patient was afebrile with a mild leukocytosis.  Flu swab was negative. Chest x-ray shows bronchitic changes without infiltrate. Suspect patient's symptoms are from a viral upper respiratory tract infection.  Patient denies any history of asthma or COPD.  Patient may benefit from PFTs outpatient.  Patient was treated with duo nebs every 6 hours and had albuterol as needed. On day of discharge patient's vitals were stable and patient was satting well on room air.    History of bipolar type I and depression Patient takes Abilify 5 mg, Lamictal 200 mg and Zoloft 200 mg.  Continued home psych meds  Tobacco abuse  Patient has a 12-24 pack year smoking history.  Patient may benefit from PFTs outpatient.  She was given a nicotine patch during admission  Discharge Vitals:   BP 116/77 (BP Location: Right Arm)   Pulse 95   Temp 98 F (36.7 C) (Oral)   Resp 18   Ht  (1.676 m)   Wt 215 lb (97.5 kg)   LMP 12/21/2016   SpO2 98%   BMI 34.70 kg/m    Discharge Instructions: Discharge Instructions    Diet - low sodium heart healthy    Complete by:  As directed    Discharge instructions    Complete by:  As directed    [name], Please use your albuterol inhaler to help  with your shortness of breath. Please follow up with your primary care provider at discharge.   Increase activity slowly    Complete by:  As directed       Signed: Camelia Phenes, DO 01/21/2017, 3:32 PM   Pager: [phone]

## 2017-01-18 NOTE — Plan of Care (Signed)
Problem: Education: Goal: Knowledge of [facility] Education information/materials will improve Outcome: Progressing POC reviewed with pt.   

## 2017-01-18 NOTE — Progress Notes (Signed)
Inpatient Diabetes Program Recommendations  AACE/ADA: New Consensus Statement on Inpatient Glycemic Control (2015)  Target Ranges:  Prepandial:   less than 140 mg/dL      Peak postprandial:   less than 180 mg/dL (1-2 hours)      Critically ill patients:  140 - 180 mg/dL   Lab Results  Component Value Date   GLUCAP 106 (H) 09/20/2011    Review of Glycemic Control  Diabetes history: None Outpatient Diabetes medications: None Current orders for Inpatient glycemic control: None  Blood glucose - 215 - On solumedrol + family hx DM  Inpatient Diabetes Program Recommendations:    Check HgbA1C to assess glycemic control prior to hospitalization Novolog 0-9 units tidwc  Will follow. Thank you. Ailene Ards, RD, LDN, CDE Inpatient Diabetes Coordinator [phone]

## 2017-01-18 NOTE — Progress Notes (Signed)
   Subjective: Patient was evaluated this morning on rounds. She states improvement in her symptoms overnight. She denies shortness of breath.  She continues to have a cough.  Objective:  Vital signs in last 24 hours: Vitals:   01/17/17 2023 01/17/17 2208 01/18/17 0506 01/18/17 0926  BP:  (!) 112/51 116/77   Pulse:  (!) 114 95   Resp:   18   Temp:  98.7 F (37.1 C) 98 F (36.7 C)   TempSrc:  Oral Oral   SpO2: 96% 95% 99% 98%  Weight:      Height:       Physical Exam  Constitutional: She is well-developed, well-nourished, and in no distress.  Cardiovascular: Normal rate, regular rhythm and normal heart sounds.  Exam reveals no gallop and no friction rub.   No murmur heard. Pulmonary/Chest: Effort normal and breath sounds normal. No respiratory distress.  Mild wheezing throughout all lung fields   Abdominal: Soft. Bowel sounds are normal. She exhibits no distension. There is no tenderness.  Musculoskeletal: She exhibits no edema.  Neurological: She is alert.  Skin: Skin is warm and dry.  Psychiatric: Mood and affect normal.    Assessment/Plan:  Viral upper respiratory tract infection/acute bronchitis Patient reports continued improvement with breathing treatments. Patient is not hypoxic. Flu was negative. She states that she is ready to go home today.   -DuoNeb's -Albuterol when necessary -Discharge with albuterol inhaler  History of bipolar type I and depression Patient takes Abilify 5 mg, Lamictal 200 mg and Zoloft 200 mg -Continue home psych meds  Tobacco abuse Patient has a 12-24 pack year smoking history.  Patient may benefit from PFTs outpatient -Nicotine patch   Dispo: Anticipated discharge today.   Camelia Phenes, DO 01/18/2017, 4:12 PM Pager: [phone]

## 2017-01-22 ENCOUNTER — Encounter (HOSPITAL_COMMUNITY): Payer: Self-pay | Admitting: Emergency Medicine

## 2017-01-22 ENCOUNTER — Observation Stay (HOSPITAL_COMMUNITY)
Admission: EM | Admit: 2017-01-22 | Discharge: 2017-01-24 | Disposition: A | Discharge status: Home / Self Care | Payer: Medicaid Other | Attending: Internal Medicine | Admitting: Internal Medicine

## 2017-01-22 DIAGNOSIS — F17210 Nicotine dependence, cigarettes, uncomplicated: Secondary | ICD-10-CM | POA: Insufficient documentation

## 2017-01-22 DIAGNOSIS — T50904A Poisoning by unspecified drugs, medicaments and biological substances, undetermined, initial encounter: Secondary | ICD-10-CM

## 2017-01-22 DIAGNOSIS — T426X4A Poisoning by other antiepileptic and sedative-hypnotic drugs, undetermined, initial encounter: Secondary | ICD-10-CM | POA: Diagnosis not present

## 2017-01-22 DIAGNOSIS — R0602 Shortness of breath: Secondary | ICD-10-CM | POA: Insufficient documentation

## 2017-01-22 DIAGNOSIS — F319 Bipolar disorder, unspecified: Secondary | ICD-10-CM | POA: Diagnosis present

## 2017-01-22 DIAGNOSIS — F3130 Bipolar disorder, current episode depressed, mild or moderate severity, unspecified: Secondary | ICD-10-CM | POA: Diagnosis present

## 2017-01-22 DIAGNOSIS — E1110 Type 2 diabetes mellitus with ketoacidosis without coma: Secondary | ICD-10-CM | POA: Diagnosis not present

## 2017-01-22 DIAGNOSIS — T43594A Poisoning by other antipsychotics and neuroleptics, undetermined, initial encounter: Secondary | ICD-10-CM | POA: Diagnosis not present

## 2017-01-22 DIAGNOSIS — Z79899 Other long term (current) drug therapy: Secondary | ICD-10-CM | POA: Diagnosis not present

## 2017-01-22 DIAGNOSIS — T43501A Poisoning by unspecified antipsychotics and neuroleptics, accidental (unintentional), initial encounter: Secondary | ICD-10-CM

## 2017-01-22 DIAGNOSIS — F1010 Alcohol abuse, uncomplicated: Secondary | ICD-10-CM | POA: Diagnosis present

## 2017-01-22 DIAGNOSIS — G4700 Insomnia, unspecified: Secondary | ICD-10-CM | POA: Diagnosis present

## 2017-01-22 LAB — COMPREHENSIVE METABOLIC PANEL
ALT: 23 U/L (ref 14–54)
AST: 20 U/L (ref 15–41)
Albumin: 3.9 g/dL (ref 3.5–5.0)
Alkaline Phosphatase: 75 U/L (ref 38–126)
Anion gap: 19 — ABNORMAL HIGH (ref 5–15)
BILIRUBIN TOTAL: 0.4 mg/dL (ref 0.3–1.2)
BUN: 5 mg/dL — AB (ref 6–20)
CO2: 18 mmol/L — ABNORMAL LOW (ref 22–32)
CREATININE: 0.84 mg/dL (ref 0.44–1.00)
Calcium: 9.3 mg/dL (ref 8.9–10.3)
Chloride: 99 mmol/L — ABNORMAL LOW (ref 101–111)
GFR calc Af Amer: >60 mL/min (ref >60)
Glucose, Bld: 376 mg/dL — ABNORMAL HIGH (ref 65–99)
Potassium: 3.3 mmol/L — ABNORMAL LOW (ref 3.5–5.1)
Sodium: 136 mmol/L (ref 135–145)
TOTAL PROTEIN: 7.1 g/dL (ref 6.5–8.1)

## 2017-01-22 LAB — CBC
HCT: 41.1 % (ref 36.0–46.0)
Hemoglobin: 14.5 g/dL (ref 12.0–15.0)
MCH: 31.2 pg (ref 26.0–34.0)
MCHC: 35.3 g/dL (ref 30.0–36.0)
MCV: 88.4 fL (ref 78.0–100.0)
PLATELETS: 448 10*3/uL — AB (ref 150–400)
RBC: 4.65 MIL/uL (ref 3.87–5.11)
RDW: 13 % (ref 11.5–15.5)
WBC: 11.5 10*3/uL — ABNORMAL HIGH (ref 4.0–10.5)

## 2017-01-22 LAB — CBG MONITORING, ED: Glucose-Capillary: 388 mg/dL — ABNORMAL HIGH (ref 65–99)

## 2017-01-22 LAB — ETHANOL: ALCOHOL ETHYL (B): 149 mg/dL — AB (ref <5)

## 2017-01-22 LAB — POC URINE PREG, ED: Preg Test, Ur: NEGATIVE

## 2017-01-22 LAB — ACETAMINOPHEN LEVEL: Acetaminophen (Tylenol), Serum: <10 ug/mL — ABNORMAL LOW (ref 10–30)

## 2017-01-22 LAB — SALICYLATE LEVEL: Salicylate Lvl: <7 mg/dL (ref 2.8–30.0)

## 2017-01-22 MED ORDER — CHARCOAL ACTIVATED PO LIQD
50.0000 g | Freq: Once | ORAL | Status: AC
  Administered 2017-01-22: 50 g via ORAL
  Filled 2017-01-22: qty 240

## 2017-01-22 MED ORDER — SODIUM CHLORIDE 0.9 % IV BOLUS (SEPSIS)
1000.0000 mL | Freq: Once | INTRAVENOUS | Status: AC
  Administered 2017-01-23: 1000 mL via INTRAVENOUS

## 2017-01-22 MED ORDER — ALBUTEROL SULFATE (2.5 MG/3ML) 0.083% IN NEBU
2.5000 mg | INHALATION_SOLUTION | Freq: Once | RESPIRATORY_TRACT | Status: AC
  Administered 2017-01-23: 2.5 mg via RESPIRATORY_TRACT
  Filled 2017-01-22: qty 3

## 2017-01-22 NOTE — ED Notes (Signed)
Checked CBG 388, RN Scott informed

## 2017-01-22 NOTE — ED Provider Notes (Signed)
[facility]-EMERGENCY DEPT Provider Note   CSN: [id number] Arrival date & time: 01/22/17  2242  By signing my name below, I, [name], attest that this documentation has been prepared under the direction and in the presence of physician practitioner, Benjiman Core, MD. Electronically Signed: Linna Olsen, Scribe. 01/22/2017. 11:16 PM.  History   Chief Complaint Chief Complaint  Patient presents with  . Drug Overdose    The history is provided by the patient. No language interpreter was used.     HPI Comments: [name] is a 36 y.o. female with PMHx including anxiety and depression who presents to the Emergency Department via EMS for evaluation of a drug overdose. Patient states she took ~45 pills of her Latuda and ~90 pills of her recently filled gabapentin around 10 PM tonight and immediately called EMS. She reports she and her husband were in an argument last night and notes she was stressed and "tired" from this event. Patient explicitly denies SI and states she was not trying to harm herself tonight, but just "wanted to sleep". She states she took two shots of Fireball whiskey PTA and denies any additional alcohol or drug use. Pt was discharged from the hospital yesterday after being admitted for bronchitis; she reports persistent but improving cough, SOB, and CP secondary to coughing for about one week. She has a h/o suicidal ideation and ingested an entire bottle of her husband's HTN medication on her 50th birthday with subsequent admission to ICU. Patient denies nausea, vomiting, headaches, numbness/tingling, weakness, confusion, or any other associated symptoms.  Past Medical History:  Diagnosis Date  . Anxiety   . Back pain   . CAP (community acquired pneumonia)   . Chronic pain syndrome   . Depression   . Gallbladder problem   . Obsessive compulsive disorder     Patient Active Problem List   Diagnosis Date Noted  . Overdose of antipsychotic 01/23/2017  .  Hypoxia 01/17/2017  . Bipolar I disorder, most recent episode depressed (HCC) 06/11/2013  . Depression with suicidal ideation 06/09/2013  . Alcohol abuse, episodic drinking behavior 06/09/2013  . Major depressive disorder, recurrent episode, severe, specified as with psychotic behavior 06/09/2013  . 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 SNORING 07/18/2009  . OVARIAN CYST 06/13/2009  . HYPERCHOLESTEROLEMIA 09/19/2008  . BACK PAIN 09/18/2008  . TOBACCO ABUSE 08/13/2008  . INSOMNIA 08/13/2008    Past Surgical History:  Procedure Laterality Date  . CESAREAN SECTION    . CHOLECYSTECTOMY  2009  . wisdom teeth extraction      OB History    Gravida Para Term Preterm AB Living   SAB TAB Ectopic Multiple Live Births           1       Home Medications    Prior to Admission medications   Medication Sig Start Date End Date Taking? Authorizing Provider  albuterol (PROVENTIL HFA;VENTOLIN HFA) 108 (90 Base) MCG/ACT inhaler Inhale 1-2 puffs into the lungs every 6 (six) hours as needed for wheezing or shortness of breath. 01/18/17  Yes Jessica Ratliff Hoffman, DO  ARIPiprazole (ABILIFY) 5 MG tablet Take 1 tablet (5 mg total) by mouth daily. For major depressive disorder. 06/13/13  Yes Neil T Mashburn, PA-C  Brexpiprazole (REXULTI PO) Take 1 tablet by mouth daily. Dose unknown. ( pt gets at [facility])   Yes Historical Provider, MD  gabapentin (NEURONTIN) 300 MG capsule  Take 300-600 mg by mouth 3 (three) times daily.  in the morning and  in the afternoon and bedtime.   Yes Historical Provider, MD  lurasidone (LATUDA) 80 MG TABS tablet Take 80 mg by mouth daily with breakfast.   Yes Historical Provider, MD  sertraline (ZOLOFT) 100 MG tablet Take 2 tablets (200 mg total) by mouth daily. For depression and anxiety. 06/13/13  Yes Lloyd Huger T Mashburn, PA-C  traZODone (DESYREL) 100 MG tablet Take 200 mg by mouth at bedtime as needed for  sleep. For insomnia.  06/13/13  Yes Tamala Julian, PA-C    Family History Family History  Problem Relation Age of Onset  . Diabetes insipidus Father   . Arthritis Father   . Heart failure Father   . COPD Father   . Emphysema Father   . Asthma Son     Social History Social History  Substance Use Topics  . Smoking status: Current Every Day Smoker    Packs/day: 0.50    Years: 23.00    Types: Cigarettes  . Smokeless tobacco: Never Used  . Alcohol use Yes     Comment: unsure amount (1/6) - states generally does not drink     Allergies   Orange oil; Carisoprodol; and Piroxicam   Review of Systems Review of Systems  Respiratory: Positive for cough and shortness of breath.   Cardiovascular: Positive for chest pain (secondary to cough).  Gastrointestinal: Negative for nausea and vomiting.  Neurological: Negative for weakness, numbness and headaches.  Psychiatric/Behavioral: Negative for confusion and suicidal ideas.   Physical Exam Updated Vital Signs BP 122/71 (BP Location: Right Arm)   Pulse (!) 106   Temp 98.4 F (36.9 C) (Oral)   Resp (!) 22   Ht  (1.676 m)   Wt 222 lb 1.6 oz (100.7 kg)   LMP 01/21/2017   SpO2 96%   BMI 35.85 kg/m   Physical Exam  Constitutional: She is oriented to person, place, and time. She appears well-developed and well-nourished. No distress.  Awake and appropriate.  HENT:  Head: Normocephalic and atraumatic.  Eyes: Conjunctivae and EOM are normal. Pupils are equal, round, and reactive to light.  Pupils are 3 mm and reactive.  Neck: Neck supple. No tracheal deviation present.  Cardiovascular: Tachycardia present.   Mild tachycardia.  Pulmonary/Chest: Effort normal. No respiratory distress. She has wheezes.  Diffuse wheezes.  Abdominal: Bowel sounds are normal. There is no tenderness.  Musculoskeletal: Normal range of motion. She exhibits no edema.  No peripheral edema.  Neurological: She is alert and oriented to person, place,  and time.  Skin: Skin is warm and dry.  Psychiatric: She has a normal mood and affect. Her behavior is normal.  Nursing note and vitals reviewed.  ED Treatments / Results  Labs (all labs ordered are listed, but only abnormal results are displayed) Labs Reviewed  COMPREHENSIVE METABOLIC PANEL - Abnormal; Notable for the following:       Result Value   Potassium 3.3 (*)    Chloride 99 (*)    CO2 18 (*)    Glucose, Bld 376 (*)    BUN 5 (*)    Anion gap 19 (*)    All other components within normal limits  ETHANOL - Abnormal; Notable for the following:    Alcohol, Ethyl (B) 149 (*)    All other components within normal limits  ACETAMINOPHEN LEVEL - Abnormal; Notable for the following:    Acetaminophen (Tylenol), Serum <10 (*)  All other components within normal limits  CBC - Abnormal; Notable for the following:    WBC 11.5 (*)    Platelets 448 (*)    All other components within normal limits  URINALYSIS, ROUTINE W REFLEX MICROSCOPIC - Abnormal; Notable for the following:    Color, Urine STRAW (*)    Glucose, UA >=500 (*)    Hgb urine dipstick LARGE (*)    Bacteria, UA RARE (*)    Squamous Epithelial / LPF 0-5 (*)    All other components within normal limits  BASIC METABOLIC PANEL - Abnormal; Notable for the following:    CO2 20 (*)    Glucose, Bld 339 (*)    BUN <5 (*)    Calcium 8.5 (*)    All other components within normal limits  BASIC METABOLIC PANEL - Abnormal; Notable for the following:    Glucose, Bld 281 (*)    BUN <5 (*)    Calcium 8.1 (*)    All other components within normal limits  GLUCOSE, CAPILLARY - Abnormal; Notable for the following:    Glucose-Capillary 283 (*)    All other components within normal limits  CBG MONITORING, ED - Abnormal; Notable for the following:    Glucose-Capillary 388 (*)    All other components within normal limits  CBG MONITORING, ED - Abnormal; Notable for the following:    Glucose-Capillary 310 (*)    All other components  within normal limits  SALICYLATE LEVEL  RAPID URINE DRUG SCREEN, HOSP PERFORMED  CK  TSH  HEMOGLOBIN A1C  POC URINE PREG, ED    EKG  EKG Interpretation  Date/Time:  Saturday January 22 2017 22:53:34 EDT Ventricular Rate:  123 PR Interval:    QRS Duration: 77 QT Interval:  306 QTC Calculation: 438 R Axis:   78 Text Interpretation:  Sinus tachycardia Borderline T abnormalities, diffuse leads Confirmed by Rubin Payor  MD, Harrold Donath [phone]) on 01/22/2017 11:01:56 PM       Radiology No results found.  Procedures Procedures (including critical care time)  DIAGNOSTIC STUDIES: Oxygen Saturation is 96% on RA, adequate by my interpretation.    COORDINATION OF CARE: 11:26 PM Discussed treatment plan with pt at bedside and pt agreed to plan.  Medications Ordered in ED Medications  enoxaparin (LOVENOX) injection 40 mg (not administered)  sodium chloride flush (NS) 0.9 % injection 3 mL (0 mLs Intravenous Duplicate 01/23/17 0230)  sodium chloride flush (NS) 0.9 % injection 3 mL (3 mLs Intravenous Given 01/23/17 0304)  sodium chloride flush (NS) 0.9 % injection 3 mL (not administered)  0.9 %  sodium chloride infusion (not administered)  insulin aspart (novoLOG) injection 0-9 Units (not administered)  charcoal activated (NO SORBITOL) (ACTIDOSE-AQUA) suspension 50 g (50 g Oral Given 01/22/17 2356)  albuterol (PROVENTIL) (2.5 MG/3ML) 0.083% nebulizer solution 2.5 mg (2.5 mg Nebulization Given 01/23/17 0000)  sodium chloride 0.9 % bolus 1,000 mL (0 mLs Intravenous Stopped 01/23/17 0100)  sodium chloride 0.9 % bolus 1,000 mL (1,000 mLs Intravenous Given 01/23/17 0304)     Initial Impression / Assessment and Plan / ED Course  I have reviewed the triage vital signs and the nursing notes.  Pertinent labs & imaging results that were available during my care of the patient were reviewed by me and considered in my medical decision making (see chart for details).     Patient with overdose. Overdosed  on gabapentin and what to do. States was not a suicide attempt but was to go to  sleep. This is however worried as it was an intentional overdose. His custom poison control. Tachycardia. Found to be hyperglycemic with a mild DKA. Will admit to primary care doctor.  Final Clinical Impressions(s) / ED Diagnoses   Final diagnoses:  Drug overdose, undetermined intent, initial encounter  Diabetic ketoacidosis without coma associated with type 2 diabetes mellitus [facility])    New Prescriptions Current Discharge Medication List     Medical screening examination/treatment/procedure(s) were performed by non-physician practitioner and as supervising physician I was immediately available for consultation/collaboration.   EKG Interpretation  Date/Time:  Saturday January 22 2017 22:53:34 EDT Ventricular Rate:  123 PR Interval:    QRS Duration: 77 QT Interval:  306 QTC Calculation: 438 R Axis:   78 Text Interpretation:  Sinus tachycardia Borderline T abnormalities, diffuse leads Confirmed by Rubin Payor  MD, Callahan Wild [phone]) on 01/22/2017 11:01:56 PM        Benjiman Core, MD 01/23/17 [phone]
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
## 2017-01-22 NOTE — ED Notes (Signed)
[name], poison control notified of overdose and recommends at least 6 hrs of observation; to expect CNS depression, slight hypotension, lethargy; IVF and norepi if hypotension occurs

## 2017-01-23 DIAGNOSIS — E876 Hypokalemia: Secondary | ICD-10-CM

## 2017-01-23 DIAGNOSIS — Z825 Family history of asthma and other chronic lower respiratory diseases: Secondary | ICD-10-CM

## 2017-01-23 DIAGNOSIS — Z915 Personal history of self-harm: Secondary | ICD-10-CM

## 2017-01-23 DIAGNOSIS — Z833 Family history of diabetes mellitus: Secondary | ICD-10-CM

## 2017-01-23 DIAGNOSIS — T43501A Poisoning by unspecified antipsychotics and neuroleptics, accidental (unintentional), initial encounter: Secondary | ICD-10-CM

## 2017-01-23 LAB — RAPID URINE DRUG SCREEN, HOSP PERFORMED
Amphetamines: NOT DETECTED
BARBITURATES: NOT DETECTED
Benzodiazepines: NOT DETECTED
Cocaine: NOT DETECTED
Opiates: NOT DETECTED
TETRAHYDROCANNABINOL: NOT DETECTED

## 2017-01-23 LAB — BASIC METABOLIC PANEL
Anion gap: 12 (ref 5–15)
Anion gap: 10 (ref 5–15)
BUN: <5 mg/dL — ABNORMAL LOW (ref 6–20)
CALCIUM: 8.5 mg/dL — AB (ref 8.9–10.3)
CHLORIDE: 106 mmol/L (ref 101–111)
CHLORIDE: 107 mmol/L (ref 101–111)
CO2: 20 mmol/L — ABNORMAL LOW (ref 22–32)
CO2: 22 mmol/L (ref 22–32)
CREATININE: 0.77 mg/dL (ref 0.44–1.00)
CREATININE: 0.7 mg/dL (ref 0.44–1.00)
Calcium: 8.1 mg/dL — ABNORMAL LOW (ref 8.9–10.3)
GFR calc Af Amer: >60 mL/min (ref >60)
GFR calc non Af Amer: >60 mL/min (ref >60)
GFR calc non Af Amer: >60 mL/min (ref >60)
Glucose, Bld: 339 mg/dL — ABNORMAL HIGH (ref 65–99)
Glucose, Bld: 281 mg/dL — ABNORMAL HIGH (ref 65–99)
Potassium: 4 mmol/L (ref 3.5–5.1)
Potassium: 4.1 mmol/L (ref 3.5–5.1)
SODIUM: 138 mmol/L (ref 135–145)
Sodium: 139 mmol/L (ref 135–145)

## 2017-01-23 LAB — TSH: TSH: 0.858 u[IU]/mL (ref 0.350–4.500)

## 2017-01-23 LAB — CK: Total CK: 53 U/L (ref 38–234)

## 2017-01-23 LAB — URINALYSIS, ROUTINE W REFLEX MICROSCOPIC
Bilirubin Urine: NEGATIVE
KETONES UR: NEGATIVE mg/dL
LEUKOCYTES UA: NEGATIVE
NITRITE: NEGATIVE
PH: 6 (ref 5.0–8.0)
PROTEIN: NEGATIVE mg/dL
Specific Gravity, Urine: 1.015 (ref 1.005–1.030)

## 2017-01-23 LAB — GLUCOSE, CAPILLARY
GLUCOSE-CAPILLARY: 124 mg/dL — AB (ref 65–99)
GLUCOSE-CAPILLARY: 160 mg/dL — AB (ref 65–99)
Glucose-Capillary: 158 mg/dL — ABNORMAL HIGH (ref 65–99)
Glucose-Capillary: 283 mg/dL — ABNORMAL HIGH (ref 65–99)
Glucose-Capillary: 294 mg/dL — ABNORMAL HIGH (ref 65–99)

## 2017-01-23 LAB — CBG MONITORING, ED: GLUCOSE-CAPILLARY: 310 mg/dL — AB (ref 65–99)

## 2017-01-23 MED ORDER — INSULIN ASPART 100 UNIT/ML ~~LOC~~ SOLN: 0.0000 [IU] | Freq: Every day | SUBCUTANEOUS | Status: DC

## 2017-01-23 MED ORDER — SODIUM CHLORIDE 0.9 % IV BOLUS (SEPSIS)
1000.0000 mL | Freq: Once | INTRAVENOUS | Status: AC
  Administered 2017-01-23: 1000 mL via INTRAVENOUS

## 2017-01-23 MED ORDER — LORAZEPAM 2 MG/ML IJ SOLN
0.5000 mg | Freq: Once | INTRAMUSCULAR | Status: AC
Start: 2017-01-23 — End: 2017-01-23
  Administered 2017-01-23: 0.5 mg via INTRAVENOUS
  Filled 2017-01-23: qty 1

## 2017-01-23 MED ORDER — INSULIN ASPART 100 UNIT/ML ~~LOC~~ SOLN
0.0000 [IU] | Freq: Three times a day (TID) | SUBCUTANEOUS | Status: DC
  Administered 2017-01-23: 2 [IU] via SUBCUTANEOUS
  Administered 2017-01-23: 1 [IU] via SUBCUTANEOUS
  Administered 2017-01-23: 5 [IU] via SUBCUTANEOUS
  Administered 2017-01-24: 3 [IU] via SUBCUTANEOUS
  Administered 2017-01-24: 2 [IU] via SUBCUTANEOUS

## 2017-01-23 MED ORDER — SODIUM CHLORIDE 0.9 % IV SOLN: 250.0000 mL | INTRAVENOUS | Status: DC | PRN

## 2017-01-23 MED ORDER — SODIUM CHLORIDE 0.9% FLUSH
3.0000 mL | Freq: Two times a day (BID) | INTRAVENOUS | Status: DC
  Administered 2017-01-23: 3 mL via INTRAVENOUS

## 2017-01-23 MED ORDER — ENOXAPARIN SODIUM 40 MG/0.4ML ~~LOC~~ SOLN
40.0000 mg | SUBCUTANEOUS | Status: DC
  Administered 2017-01-23 – 2017-01-24 (×2): 40 mg via SUBCUTANEOUS
  Filled 2017-01-23 (×2): qty 0.4

## 2017-01-23 MED ORDER — INSULIN ASPART 100 UNIT/ML ~~LOC~~ SOLN: 0.0000 [IU] | Freq: Three times a day (TID) | SUBCUTANEOUS | Status: DC

## 2017-01-23 MED ORDER — POTASSIUM CHLORIDE CRYS ER 20 MEQ PO TBCR: 40.0000 meq | EXTENDED_RELEASE_TABLET | Freq: Once | ORAL | Status: DC

## 2017-01-23 MED ORDER — SODIUM CHLORIDE 0.9 % IV SOLN: 1000.0000 mL | Freq: Once | INTRAVENOUS | Status: DC

## 2017-01-23 MED ORDER — SODIUM CHLORIDE 0.9% FLUSH
3.0000 mL | Freq: Two times a day (BID) | INTRAVENOUS | Status: DC
  Administered 2017-01-23 – 2017-01-24 (×3): 3 mL via INTRAVENOUS

## 2017-01-23 MED ORDER — SODIUM CHLORIDE 0.9% FLUSH: 3.0000 mL | INTRAVENOUS | Status: DC | PRN

## 2017-01-23 NOTE — Progress Notes (Signed)
Poison control just called checking on pt. They stated that they were going to close her out on their side.   Larey Days, RN

## 2017-01-23 NOTE — Progress Notes (Signed)
This nurse took pts cigarettes and put them at the front desk with her name on them.   Larey Days, RN

## 2017-01-23 NOTE — Progress Notes (Signed)
   Subjective:  Patient admitted overnight after intentional ingestion of ~80 pills gabapentin and ~40 pills Latuda. She states she was stressed after an argument with her husband and took the pills because she wanted to sleep. She says she did not have any intention to harm herself or commit suicide and denies any current suicidal or homicidal ideation. She does admit to a prior intentional suicide attempt in 2012 in which she took a large amount of blood pressure pills. She says she follows up regularly with [facility]. She did not lose consciousness and denies any nausea, vomiting, abdominal pain, chest pain, shortness of breath, or numbness/tingling. She is answering all questions appropriately and eating breakfast when seen. She was on the phone with the father of her child during rounds. She is agreeable to speak with a psychiatrist while admitted.  Objective:  Vital signs in last 24 hours: Vitals:   01/23/17 0200 01/23/17 0242 01/23/17 0458 01/23/17 1114  BP: 106/75 107/72 122/71 125/62  Pulse: (!) 106 100 (!) 106 96  Resp: 19  (!) 22 19  Temp:  98 F (36.7 C) 98.4 F (36.9 C) 98.3 F (36.8 C)  TempSrc:  Oral Oral Oral  SpO2: 98% 96% 96% 95%  Weight:   222 lb 1.6 oz (100.7 kg)   Height:       General: sitting up in bed, no acute distress Cardiac: mild tachycardia, no rubs, murmurs or gallops Pulm: clear to auscultation bilaterally, moving normal volumes of air Abd: soft, nontender, nondistended, BS present Ext: warm and well perfused, no pedal edema Neuro: alert and oriented X3 Psych: Denies suicidal or homicidal ideation   Assessment/Plan:  Principal Problem:   Overdose of antipsychotic Active Problems:   INSOMNIA   Alcohol abuse, episodic drinking behavior   Bipolar I disorder, most recent episode depressed (HCC)  Eviana A Haswell Whitingis a 36 y.o.femalewith PMHx Significant for anxiety, depression and bipolar disorder brought to ED via EMS after taking approximately  85 pills of 300 mg of gabapentin and 40 pills of Latuda 80 mg around 10 PM after having an argument with her husband.  Intentional Overdose of Medications: Patient has a history of anxiety, depression, bipolar disorder and multiple admissions with worsening of depression and drug overdoses. Sne has a prior suicide attempt in 2012 after taking entire bottle of her husband's antihypertensives requiring ICU admission. Although she currently denies any current suicidal ideations and states that she does not want to harm herself or anybody else, I think it is prudent to have a formal psychiatric evaluation while she is admitted. She is otherwise hemodynamically and medically stable without evidence of thyroid dysfunction, rhabdomyolysis, renal dysfunction, or cardiac arrythmia. - Psychiatry consulted, appreciate assistance - Continue to monitor   Hyperglycemia: Blood sugars have been elevated 300+ since admission. This may be secondary to [facility] which can cause hyperglycemia. Gabapentin can also cause hyperglycemia in a small percentage of patients. She certainly may have underlying diabetes as well. - Hgb A1c pending - Continue SSI TIDAC + HS  Dispo: Anticipated discharge in approximately 1-2 day(s).   Darreld Mclean, MD 01/23/2017, 11:42 AM

## 2017-01-23 NOTE — Progress Notes (Signed)
New Admission Note:  Arrival Method: By bed from the ED around 0200 Mental Orientation: Sleeping, will arouse to voice. Oriented x 4 Telemetry: [address], CCMD notified Assessment: Completed Skin: Completed, refer to flowsheets IV: Left hand Pain: Denies Tubes: None Safety Measures: Safety Fall Prevention Plan was given, discussed and signed. Admission: Completed [address] Orientation: Patient has been orientated to the room, unit and the staff. Family: None  Orders have been reviewed and implemented. Will continue to monitor the patient. Call light has been placed within reach.  Alfonse Ras, RN  Phone Number: [phone]

## 2017-01-23 NOTE — Progress Notes (Signed)
Paged MD regarding alcohol level of 149 and needing an order for CIWA. MD stated that she wants to address this in the AM d/t pt stating she does not drink everyday, this was more of an occasional thing. Also, CBG 283, MD wants to continue fluids and wait on A1C. CBG is going down from initial 310.   Larey Days, RN

## 2017-01-23 NOTE — ED Notes (Signed)
Checked CBG 310, RN Scott informed

## 2017-01-23 NOTE — H&P (Signed)
Date: 01/23/2017               Patient Name:  [name] MRN: [id number]  DOB: [birth year] Age / Sex: 36 y.o., female   PCP: No Pcp Per Patient         Medical Service: Internal Medicine Teaching Service         Attending Physician: Dr. Tyson Alias, MD    First Contact: Dr.Hoffmann Pager: [phone]  Second Contact: Dr. Allena Katz Pager: [phone]       After Hours (After 5p/  First Contact Pager: [phone]  weekends / holidays): Second Contact Pager: [phone]   Chief Complaint: Drug Overdose  History of Present Illness: [name] is a 36 y.o. female with PMHx Significant for anxiety, depression And bipolar disorder brought to ED via EMS after taking approximately 90 pills of 300 mg of gabapentin and 30 pills of Latuda 80 mg around 10 PM after having an argument with her husband.  According to patient she had no suicidal ideation, and does not want to hurt herself, but she was very tired and stressed and made a silly mistake. She has an history of previous suicidal attempt in 2012 when she took the whole bottle of her husband's antihypertensive requiring admission to ICU. She also had multiple ED visits with worsening depression and suicidal ideations in the past. Her sister walked in while she was taking the medicine so she called the EMS.  She states she took two shots of Fireball whiskey, but denies any other illicit drug use.  She was complaining of tiredness and sleepiness.  Patient denies any headache, dizziness, blurry vision, abnormal movements,numbness/tingling, weakness, confusion, shortness of breath, palpitations, chest pain, nausea or vomiting, diarrhea or constipation. She also denies any upper respiratory or urinary symptoms. She was recently discharged on 01/21/2017 after having acute bronchitis, according to patient she is still having some productive cough but it is improving.  Meds:  Current Meds  Medication Sig  . albuterol  (PROVENTIL HFA;VENTOLIN HFA) 108 (90 Base) MCG/ACT inhaler Inhale 1-2 puffs into the lungs every 6 (six) hours as needed for wheezing or shortness of breath.  . ARIPiprazole (ABILIFY) 5 MG tablet Take 1 tablet (5 mg total) by mouth daily. For major depressive disorder.  . Brexpiprazole (REXULTI PO) Take 1 tablet by mouth daily. Dose unknown. ( pt gets at [facility])  . gabapentin (NEURONTIN) 300 MG capsule Take 300-600 mg by mouth 3 (three) times daily.  in the morning and  in the afternoon and bedtime.  Marland Kitchen lurasidone (LATUDA) 80 MG TABS tablet Take 80 mg by mouth daily with breakfast.  . sertraline (ZOLOFT) 100 MG tablet Take 2 tablets (200 mg total) by mouth daily. For depression and anxiety.  . traZODone (DESYREL) 100 MG tablet Take 200 mg by mouth at bedtime as needed for sleep. For insomnia.      Allergies: Allergies as of 01/22/2017 - Review Complete 01/22/2017  Allergen Reaction Noted  . Orange oil Shortness Of Breath 08/06/2011  . Carisoprodol  08/06/2011  . Piroxicam  08/06/2011   Past Medical History:  Diagnosis Date  . Anxiety   . Back pain   . CAP (community acquired pneumonia)   . Chronic pain syndrome   . Depression   . Gallbladder problem   . Obsessive compulsive disorder     Family History: Father has diabetes and COPD.  Social History: Lives with her husband and a 80-year-old son. Smokes half a  pack per day, uses. Or whiskey 2-3 times per week and denies any other illicit drug use.  Review of Systems: A complete ROS was negative except as per HPI.   Physical Exam: Blood pressure 106/75, pulse (!) 106, temperature 98.3 F (36.8 C), temperature source Oral, resp. rate 19, height  (1.676 m), weight 218 lb 7.6 oz (99.1 kg), last menstrual period 01/21/2017, SpO2 98 %. Vitals:   01/23/17 0115 01/23/17 0130 01/23/17 0145 01/23/17 0200  BP: (!) 145/97 99/69 107/75 106/75  Pulse: (!) 115 (!) 118 (!) 105 (!) 106  Resp: (!) 26 (!) 23 (!) 29 19  Temp:        TempSrc:      SpO2: 98% 92% 94% 98%  Weight:      Height:       General: Vital signs reviewed.  Patient is well-developed and well-nourished, in no acute distress and cooperative with exam.  Head: Normocephalic and atraumatic. Eyes: EOMI, Dilated pupil reactive to light,conjunctivae normal, no scleral icterus.  Neck: Supple, trachea midline, normal ROM, no JVD, masses, thyromegaly, or carotid bruit present.  Cardiovascular: Tachycardia, S1 normal, S2 normal, no murmurs, gallops, or rubs. Pulmonary/Chest: Clear to auscultation bilaterally, no wheezes, rales, or rhonchi. Abdominal: Soft, non-tender, non-distended, BS +, no masses, organomegaly, or guarding present.  Musculoskeletal: No joint deformities, erythema, or stiffness, ROM full and nontender. Extremities: No lower extremity edema bilaterally,  pulses symmetric and intact bilaterally. No cyanosis or clubbing. Neurological: A&O x3, Strength is normal and symmetric bilaterally, cranial nerve II-XII are grossly intact, no focal motor deficit, sensory intact to light touch bilaterally.  Skin: Warm, dry and intact. No rashes or erythema. Psychiatric: Normal mood and affect. speech and behavior is normal. Cognition and memory are normal.  Labs. CBC Latest Ref Rng & Units 01/22/2017 01/18/2017 01/17/2017  WBC 4.0 - 10.5 K/uL 11.5(H) 15.6(H) 15.3(H)  Hemoglobin 12.0 - 15.0 g/dL 16.1 09.6 04.5  Hematocrit 36.0 - 46.0 % 41.1 37.6 40.1  Platelets 150 - 400 K/uL 448(H) 329 311   CMP Latest Ref Rng & Units 01/23/2017 01/22/2017 01/18/2017  Glucose 65 - 99 mg/dL 409(W) 119(J) 478(G)  BUN 6 - 20 mg/dL <9(F) 5(L) 8  Creatinine 0.44 - 1.00 mg/dL 6.21 3.08 6.57  Sodium 135 - 145 mmol/L 138 136 137  Potassium 3.5 - 5.1 mmol/L 4.0 3.3(L) 3.9  Chloride 101 - 111 mmol/L 106 99(L) 103  CO2 22 - 32 mmol/L 20(L) 18(L) 25  Calcium 8.9 - 10.3 mg/dL 8.4(O) 9.3 8.9  Total Protein 6.5 - 8.1 g/dL - 7.1 -  Total Bilirubin 0.3 - 1.2 mg/dL - 0.4 -  Alkaline Phos  38 - 126 U/L - 75 -  AST 15 - 41 U/L - 20 -  ALT 14 - 54 U/L - 23 -   Urinalysis    Component Value Date/Time   COLORURINE STRAW (A) 01/23/2017 0038   APPEARANCEUR CLEAR 01/23/2017 0038   LABSPEC 1.015 01/23/2017 0038   PHURINE 6.0 01/23/2017 0038   GLUCOSEU >=500 (A) 01/23/2017 0038   HGBUR LARGE (A) 01/23/2017 0038   HGBUR negative 12/26/2008 1435   BILIRUBINUR NEGATIVE 01/23/2017 0038   KETONESUR NEGATIVE 01/23/2017 0038   PROTEINUR NEGATIVE 01/23/2017 0038   UROBILINOGEN 0.2 06/08/2013 2302   NITRITE NEGATIVE 01/23/2017 0038   LEUKOCYTESUR NEGATIVE 01/23/2017 0038   Urine pregnancy test. Negative Acetaminophen level. < 10 Salicylate level. <7.0 Ethanol. 149  EKG: Sinus tachycardia, no acute change, QTc 438.  Assessment & Plan by  Problem:  [name] is a 36 y.o. female with PMHx Significant for anxiety, depression and bipolar disorder brought to ED via EMS after taking approximately 90 pills of 300 mg of gabapentin and 30 pills of Latuda 80 mg around 10 PM after having an argument with her husband.  Drug Overdose. Patient with history of anxiety and depression and multiple admissions with worsening of depression and suicidal ideations. One previous suicide attempt in 2012 after taking entire bottle of her husband's antihypertensives requiring ICU admission. Although patient denies any current suicidal ideations and states that she does not want to harm herself or anybody else, she had risk factors.  Latuda can cause anticholinergic effects, QTc prolongation,          metabolic syndrome, bradycardia or tachycardia, orthostatic hypotension, CNS depression especially if taken with alcohol and extrapyramidal side effects. currently she was having some tachycardia and mild hypotension. her previous EKGs also shows sinus tachycardia, not sure about her baseline. she was complaining of tiredness, exhibited no other signs of CNS depression.  Half life of about 18 hours.    Gabapentin can cause  confusion, CNS depression, drowsiness, hyperkinesia, paresthesia, abnormal vision, nystagmus, hypertension, rhabdomyolysis and increased creatinine kinase.    patient was little drowsy, her UA shows positive dipstick without any RBCs. she has no signs of nystagmus,  hyperkinesia or paresthesias. Half life of 5-7 hours.  She was given a dose of activated charcoal.  -Admit to telemetry. -IVNS bolus (she already got 1 L of bolus in ED) - CBG monitoring  -CK levels   -TSH  Hyperglycemia.  she was hyperglycemic with blood glucose in 300s, initial CMP shows a gap of 19, which closed with 1 L of normal saline bolus. she was hyperglycemic with blood glucose level in 200s during her recent admission. she might be diabetic, although no diagnosis of diabetes. Latuda can cause hyperglycemia too. -Check A1c.     Hypokalemia. Might be a lab error as her  initial CMP shows potassium of 3.2, which becomes 4.0  without any replacement. -Repeat BMP in the morning.                                                                                                                                                                      CODE STATUS. Full DVT prophylaxis. Lovenox Diet. Carb modified  Dispo: Admit patient to Observation with expected length of stay less than 2 midnights.  Signed: Arnetha Courser, MD 01/23/2017, 2:13 AM  Pager: [phone]

## 2017-01-23 NOTE — Progress Notes (Signed)
Pt wants to leave. This nurse called MD regarding this and was told that pt needs to be IVC d/t overdose yesterday. Suicide precautions were discontinued yesterday upon arrival to the unit. MD does not want pt to leave and is coming up to the unit. To speak with pt about situation.   Larey Days, RN

## 2017-01-23 NOTE — ED Notes (Signed)
Spoke with Dr. Karma Greaser and he is removing the order for a suicide sitter.

## 2017-01-24 DIAGNOSIS — G4700 Insomnia, unspecified: Secondary | ICD-10-CM

## 2017-01-24 DIAGNOSIS — Z91018 Allergy to other foods: Secondary | ICD-10-CM

## 2017-01-24 DIAGNOSIS — F17210 Nicotine dependence, cigarettes, uncomplicated: Secondary | ICD-10-CM

## 2017-01-24 DIAGNOSIS — R739 Hyperglycemia, unspecified: Secondary | ICD-10-CM

## 2017-01-24 DIAGNOSIS — F3130 Bipolar disorder, current episode depressed, mild or moderate severity, unspecified: Secondary | ICD-10-CM

## 2017-01-24 DIAGNOSIS — T43592A Poisoning by other antipsychotics and neuroleptics, intentional self-harm, initial encounter: Secondary | ICD-10-CM

## 2017-01-24 DIAGNOSIS — F1010 Alcohol abuse, uncomplicated: Secondary | ICD-10-CM

## 2017-01-24 DIAGNOSIS — F319 Bipolar disorder, unspecified: Secondary | ICD-10-CM

## 2017-01-24 DIAGNOSIS — Z888 Allergy status to other drugs, medicaments and biological substances status: Secondary | ICD-10-CM

## 2017-01-24 DIAGNOSIS — T43502A Poisoning by unspecified antipsychotics and neuroleptics, intentional self-harm, initial encounter: Secondary | ICD-10-CM

## 2017-01-24 DIAGNOSIS — J209 Acute bronchitis, unspecified: Secondary | ICD-10-CM

## 2017-01-24 DIAGNOSIS — T426X2A Poisoning by other antiepileptic and sedative-hypnotic drugs, intentional self-harm, initial encounter: Secondary | ICD-10-CM

## 2017-01-24 DIAGNOSIS — J069 Acute upper respiratory infection, unspecified: Secondary | ICD-10-CM

## 2017-01-24 DIAGNOSIS — T1491XA Suicide attempt, initial encounter: Secondary | ICD-10-CM

## 2017-01-24 LAB — GLUCOSE, CAPILLARY
GLUCOSE-CAPILLARY: 206 mg/dL — AB (ref 65–99)
GLUCOSE-CAPILLARY: 155 mg/dL — AB (ref 65–99)

## 2017-01-24 LAB — HEMOGLOBIN A1C
HEMOGLOBIN A1C: 7.9 % — AB (ref 4.8–5.6)
MEAN PLASMA GLUCOSE: 180 mg/dL

## 2017-01-24 MED ORDER — GABAPENTIN 300 MG PO CAPS: 300.0000 mg | ORAL_CAPSULE | Freq: Three times a day (TID) | ORAL | Status: DC

## 2017-01-24 MED ORDER — IPRATROPIUM-ALBUTEROL 0.5-2.5 (3) MG/3ML IN SOLN
3.0000 mL | Freq: Four times a day (QID) | RESPIRATORY_TRACT | Status: DC | PRN
  Administered 2017-01-24: 3 mL via RESPIRATORY_TRACT
  Filled 2017-01-24: qty 3

## 2017-01-24 MED ORDER — LURASIDONE HCL 80 MG PO TABS: 80.0000 mg | ORAL_TABLET | Freq: Every day | ORAL | Status: DC

## 2017-01-24 MED ORDER — ALBUTEROL SULFATE (2.5 MG/3ML) 0.083% IN NEBU: 2.5000 mg | INHALATION_SOLUTION | Freq: Four times a day (QID) | RESPIRATORY_TRACT | Status: DC | PRN

## 2017-01-24 NOTE — Care Management Note (Signed)
Case Management Note  Patient Details  Name: Barbaraann Avans MRN: [id number] Date of Birth: [birth year]  Subjective/Objective:                    Action/Plan:   Expected Discharge Date:  01/24/17               Expected Discharge Plan:  Home/Self Care  In-House Referral:     Discharge planning Services  CM Consult, MATCH Program, Medication Assistance, Indigent Health Clinic  Post Acute Care Choice:    Choice offered to:  Patient  DME Arranged:    DME Agency:     HH Arranged:    HH Agency:     Status of Service:  Completed, signed off  If discussed at [employer] of Stay Meetings, dates discussed:    Additional Comments:  Kingsley Plan, RN 01/24/2017, 4:46 PM

## 2017-01-24 NOTE — Consult Note (Signed)
[facility] ([facility]) Face-to-Face Psychiatry Consult   Reason for Consult:  Intentional overdose Referring Physician:  Dr. Heide Spark Patient Identification: [name] MRN:  [id number] Principal Diagnosis: Overdose of antipsychotic Diagnosis:   Patient Active Problem List   Diagnosis Date Noted  . Overdose of antipsychotic [T43.501A] 01/23/2017  . Hypoxia [R09.02] 01/17/2017  . Bipolar I disorder, most recent episode depressed (HCC) [F31.30] 06/11/2013  . Depression with suicidal ideation [F32.9, R45.851] 06/09/2013  . Alcohol abuse, episodic drinking behavior [F10.10] 06/09/2013  . Major depressive disorder, recurrent episode, severe, specified as with psychotic behavior [F33.3] 06/09/2013  . Benzodiazepine-based tranquilizers causing adverse effect in therapeutic use [T42.4X5A] 10/19/2011  . SNORING [R06.09, R09.89] 07/18/2009  . OVARIAN CYST [N83.209] 06/13/2009  . HYPERCHOLESTEROLEMIA [E78.00] 09/19/2008  . BACK PAIN [M54.9] 09/18/2008  . TOBACCO ABUSE [F17.200] 08/13/2008  . INSOMNIA [G47.00] 08/13/2008    Total Time spent with patient: 1 hour  Subjective:   [name] is a 36 y.o. female patient admitted with Intentional drug overdose.  HPI:  Amy A Haswell Whitingis a 36 y.o.female, seen, chart reviewed and case discussed with the internal medicine resident and also patient husband was at bedside. Patient is known to the behavioral health from her previous acute psychiatric hospitalization 2014 for bipolar depression and currently receiving outpatient medication management from [facility]. Patient has been suffering with bronchitis which is making her sick and tired all the time. Potentially patient husband gave her fireball whiskey which she had and then taken overdose of gabapentin and Latuda with intention but denied intention to hurt herself or other people. Patient has no evidence of psychosis. Patient husband support her and her statements during  my evaluation. Patient husband also willing to give a medication on daily basis and keep the rest of the medication locked up in a cabinet. Patient husband regrets about providing risk due to her and she feels guilty about intentional overdose and contract for safety during this evaluation.  Past Psychiatric History: Bipolar disorder, MRE is depression, admitted to [facility] in 06/09/2013.  Risk to Self: Is patient at risk for suicide?: No, but patient needs Medical Clearance Risk to Others:   Prior Inpatient Therapy:   Prior Outpatient Therapy:    Past Medical History:  Past Medical History:  Diagnosis Date  . Anxiety   . Back pain   . CAP (community acquired pneumonia)   . Chronic pain syndrome   . Depression   . Gallbladder problem   . Obsessive compulsive disorder     Past Surgical History:  Procedure Laterality Date  . CESAREAN SECTION    . CHOLECYSTECTOMY  2009  . wisdom teeth extraction     Family History:  Family History  Problem Relation Age of Onset  . Diabetes insipidus Father   . Arthritis Father   . Heart failure Father   . COPD Father   . Emphysema Father   . Asthma Son    Family Psychiatric  History: Noncontributory  Social History:  History  Alcohol Use  . Yes    Comment: unsure amount (1/6) - states generally does not drink     History  Drug Use No    Social History   Social History  . Marital status: Married    Spouse name: N/A  . Number of children: N/A  . Years of education: N/A   Social History Main Topics  . Smoking status: Current Every Day Smoker    Packs/day: 0.50    Years: 23.00  Types: Cigarettes  . Smokeless tobacco: Never Used  . Alcohol use Yes     Comment: unsure amount (1/6) - states generally does not drink  . Drug use: No  . Sexual activity: Yes    Birth control/ protection: None   Other Topics Concern  . None   Social History Narrative  . None   Additional Social History:    Allergies:   Allergies  Allergen  Reactions  . Orange Oil Shortness Of Breath  . Carisoprodol     Skin peeled   . Piroxicam     Skin peeled    Labs:  Results for orders placed or performed during the hospital encounter of 01/22/17 (from the past 48 hour(s))  Comprehensive metabolic panel     Status: Abnormal   Collection Time: 01/22/17 11:16 PM  Result Value Ref Range   Sodium 136 135 - 145 mmol/L   Potassium 3.3 (L) 3.5 - 5.1 mmol/L   Chloride 99 (L) 101 - 111 mmol/L   CO2 18 (L) 22 - 32 mmol/L   Glucose, Bld 376 (H) 65 - 99 mg/dL   BUN 5 (L) 6 - 20 mg/dL   Creatinine, Ser 0.84 0.44 - 1.00 mg/dL   Calcium 9.3 8.9 - 10.3 mg/dL   Total Protein 7.1 6.5 - 8.1 g/dL   Albumin 3.9 3.5 - 5.0 g/dL   AST 20 15 - 41 U/L   ALT 23 14 - 54 U/L   Alkaline Phosphatase 75 38 - 126 U/L   Total Bilirubin 0.4 0.3 - 1.2 mg/dL   GFR calc non Af Amer >60 >60 mL/min   GFR calc Af Amer >60 >60 mL/min    Comment: (NOTE) The eGFR has been calculated using the CKD EPI equation. This calculation has not been validated in all clinical situations. eGFR's persistently <60 mL/min signify possible Chronic Kidney Disease.    Anion gap 19 (H) 5 - 15  Ethanol     Status: Abnormal   Collection Time: 01/22/17 11:16 PM  Result Value Ref Range   Alcohol, Ethyl (B) 149 (H) <5 mg/dL    Comment:        LOWEST DETECTABLE LIMIT FOR SERUM ALCOHOL IS 5 mg/dL FOR MEDICAL PURPOSES ONLY   Salicylate level     Status: None   Collection Time: 01/22/17 11:16 PM  Result Value Ref Range   Salicylate Lvl <0.4 2.8 - 30.0 mg/dL  Acetaminophen level     Status: Abnormal   Collection Time: 01/22/17 11:16 PM  Result Value Ref Range   Acetaminophen (Tylenol), Serum <10 (L) 10 - 30 ug/mL    Comment:        THERAPEUTIC CONCENTRATIONS VARY SIGNIFICANTLY. A RANGE OF 10-30 ug/mL MAY BE AN EFFECTIVE CONCENTRATION FOR MANY PATIENTS. HOWEVER, SOME ARE BEST TREATED AT CONCENTRATIONS OUTSIDE THIS RANGE. ACETAMINOPHEN CONCENTRATIONS >150 ug/mL AT 4 HOURS  AFTER INGESTION AND >50 ug/mL AT 12 HOURS AFTER INGESTION ARE OFTEN ASSOCIATED WITH TOXIC REACTIONS.   cbc     Status: Abnormal   Collection Time: 01/22/17 11:16 PM  Result Value Ref Range   WBC 11.5 (H) 4.0 - 10.5 K/uL   RBC 4.65 3.87 - 5.11 MIL/uL   Hemoglobin 14.5 12.0 - 15.0 g/dL   HCT 41.1 36.0 - 46.0 %   MCV 88.4 78.0 - 100.0 fL   MCH 31.2 26.0 - 34.0 pg   MCHC 35.3 30.0 - 36.0 g/dL   RDW 13.0 11.5 - 15.5 %   Platelets 448 (H)  150 - 400 K/uL  Rapid urine drug screen (hospital performed)     Status: None   Collection Time: 01/22/17 11:16 PM  Result Value Ref Range   Opiates NONE DETECTED NONE DETECTED   Cocaine NONE DETECTED NONE DETECTED   Benzodiazepines NONE DETECTED NONE DETECTED   Amphetamines NONE DETECTED NONE DETECTED   Tetrahydrocannabinol NONE DETECTED NONE DETECTED   Barbiturates NONE DETECTED NONE DETECTED    Comment:        DRUG SCREEN FOR MEDICAL PURPOSES ONLY.  IF CONFIRMATION IS NEEDED FOR ANY PURPOSE, NOTIFY LAB WITHIN 5 DAYS.        LOWEST DETECTABLE LIMITS FOR URINE DRUG SCREEN Drug Class       Cutoff (ng/mL) Amphetamine      1000 Barbiturate      200 Benzodiazepine   710 Tricyclics       626 Opiates          300 Cocaine          300 THC              50   CBG monitoring, ED     Status: Abnormal   Collection Time: 01/22/17 11:31 PM  Result Value Ref Range   Glucose-Capillary 388 (H) 65 - 99 mg/dL   Comment 1 Notify RN   POC urine preg, ED     Status: None   Collection Time: 01/22/17 11:33 PM  Result Value Ref Range   Preg Test, Ur NEGATIVE NEGATIVE    Comment:        THE SENSITIVITY OF THIS METHODOLOGY IS >24 mIU/mL   Urinalysis, Routine w reflex microscopic     Status: Abnormal   Collection Time: 01/23/17 12:38 AM  Result Value Ref Range   Color, Urine STRAW (A) YELLOW   APPearance CLEAR CLEAR   Specific Gravity, Urine 1.015 1.005 - 1.030   pH 6.0 5.0 - 8.0   Glucose, UA >=500 (A) NEGATIVE mg/dL   Hgb urine dipstick LARGE (A)  NEGATIVE   Bilirubin Urine NEGATIVE NEGATIVE   Ketones, ur NEGATIVE NEGATIVE mg/dL   Protein, ur NEGATIVE NEGATIVE mg/dL   Nitrite NEGATIVE NEGATIVE   Leukocytes, UA NEGATIVE NEGATIVE   RBC / HPF 0-5 0 - 5 RBC/hpf   WBC, UA 0-5 0 - 5 WBC/hpf   Bacteria, UA RARE (A) NONE SEEN   Squamous Epithelial / LPF 0-5 (A) NONE SEEN  Basic metabolic panel     Status: Abnormal   Collection Time: 01/23/17  1:33 AM  Result Value Ref Range   Sodium 138 135 - 145 mmol/L   Potassium 4.0 3.5 - 5.1 mmol/L    Comment: DELTA CHECK NOTED   Chloride 106 101 - 111 mmol/L   CO2 20 (L) 22 - 32 mmol/L   Glucose, Bld 339 (H) 65 - 99 mg/dL   BUN <5 (L) 6 - 20 mg/dL   Creatinine, Ser 0.77 0.44 - 1.00 mg/dL   Calcium 8.5 (L) 8.9 - 10.3 mg/dL   GFR calc non Af Amer >60 >60 mL/min   GFR calc Af Amer >60 >60 mL/min    Comment: (NOTE) The eGFR has been calculated using the CKD EPI equation. This calculation has not been validated in all clinical situations. eGFR's persistently <60 mL/min signify possible Chronic Kidney Disease.    Anion gap 12 5 - 15  Hemoglobin A1c     Status: Abnormal   Collection Time: 01/23/17  1:33 AM  Result Value Ref Range  Hgb A1c MFr Bld 7.9 (H) 4.8 - 5.6 %    Comment: (NOTE)         Pre-diabetes: 5.7 - 6.4         Diabetes: >6.4         Glycemic control for adults with diabetes: <7.0    Mean Plasma Glucose 180 mg/dL    Comment: (NOTE) Performed At: [facility] [place], [place] [id number] Lindon Romp MD AQ:[id number]   CBG monitoring, ED     Status: Abnormal   Collection Time: 01/23/17  1:49 AM  Result Value Ref Range   Glucose-Capillary 310 (H) 65 - 99 mg/dL   Comment 1 Notify RN   Glucose, capillary     Status: Abnormal   Collection Time: 01/23/17  2:42 AM  Result Value Ref Range   Glucose-Capillary 283 (H) 65 - 99 mg/dL  Basic metabolic panel     Status: Abnormal   Collection Time: 01/23/17  4:09 AM  Result Value Ref Range   Sodium 139  135 - 145 mmol/L   Potassium 4.1 3.5 - 5.1 mmol/L   Chloride 107 101 - 111 mmol/L   CO2 22 22 - 32 mmol/L   Glucose, Bld 281 (H) 65 - 99 mg/dL   BUN <5 (L) 6 - 20 mg/dL   Creatinine, Ser 0.70 0.44 - 1.00 mg/dL   Calcium 8.1 (L) 8.9 - 10.3 mg/dL   GFR calc non Af Amer >60 >60 mL/min   GFR calc Af Amer >60 >60 mL/min    Comment: (NOTE) The eGFR has been calculated using the CKD EPI equation. This calculation has not been validated in all clinical situations. eGFR's persistently <60 mL/min signify possible Chronic Kidney Disease.    Anion gap 10 5 - 15  CK     Status: None   Collection Time: 01/23/17  4:09 AM  Result Value Ref Range   Total CK 53 38 - 234 U/L  TSH     Status: None   Collection Time: 01/23/17  4:09 AM  Result Value Ref Range   TSH 0.858 0.350 - 4.500 uIU/mL    Comment: Performed by a 3rd Generation assay with a functional sensitivity of <=0.01 uIU/mL.  Glucose, capillary     Status: Abnormal   Collection Time: 01/23/17  8:01 AM  Result Value Ref Range   Glucose-Capillary 294 (H) 65 - 99 mg/dL  Glucose, capillary     Status: Abnormal   Collection Time: 01/23/17 11:58 AM  Result Value Ref Range   Glucose-Capillary 124 (H) 65 - 99 mg/dL  Glucose, capillary     Status: Abnormal   Collection Time: 01/23/17  5:02 PM  Result Value Ref Range   Glucose-Capillary 160 (H) 65 - 99 mg/dL   Comment 1 Notify RN    Comment 2 Document in Chart   Glucose, capillary     Status: Abnormal   Collection Time: 01/23/17 11:55 PM  Result Value Ref Range   Glucose-Capillary 158 (H) 65 - 99 mg/dL  Glucose, capillary     Status: Abnormal   Collection Time: 01/24/17  7:55 AM  Result Value Ref Range   Glucose-Capillary 206 (H) 65 - 99 mg/dL    Current Facility-Administered Medications  Medication Dose Route Frequency Provider Last Rate Last Dose  . 0.9 %  sodium chloride infusion  250 mL Intravenous PRN Maryellen Pile, MD      . albuterol (PROVENTIL) (2.5 MG/3ML) 0.083% nebulizer  solution 2.5 mg  2.5 mg Inhalation Q6H PRN Jessica Ratliff Hoffman, DO      . enoxaparin (LOVENOX) injection 40 mg  40 mg Subcutaneous Q24H Maryellen Pile, MD   40 mg at 01/24/17 0910  . insulin aspart (novoLOG) injection 0-5 Units  0-5 Units Subcutaneous QHS Zada Finders, MD      . insulin aspart (novoLOG) injection 0-9 Units  0-9 Units Subcutaneous TID WC Maryellen Pile, MD   3 Units at 01/24/17 0909  . ipratropium-albuterol (DUONEB) 0.5-2.5 (3) MG/3ML nebulizer solution 3 mL  3 mL Nebulization Q6H PRN Jessica Ratliff Hoffman, DO   3 mL at 01/24/17 1128  . sodium chloride flush (NS) 0.9 % injection 3 mL  3 mL Intravenous Q12H Maryellen Pile, MD   3 mL at 01/23/17 2200  . sodium chloride flush (NS) 0.9 % injection 3 mL  3 mL Intravenous Q12H Maryellen Pile, MD   3 mL at 01/24/17 0911  . sodium chloride flush (NS) 0.9 % injection 3 mL  3 mL Intravenous PRN Maryellen Pile, MD        Musculoskeletal: Strength & Muscle Tone: within normal limits Gait & Station: normal Patient leans: N/A  Psychiatric Specialty Exam: Physical Exam as per history and physical   ROS complaining about bronchitis, required breathing treatments on arrival and feeling somewhat better but continued to have a dry cough.  No Fever-chills, No Headache, No changes with Vision or hearing, reports vertigo No problems swallowing food or Liquids, No Chest pain, Cough or Shortness of Breath, No Abdominal pain, No Nausea or Vommitting, Bowel movements are regular, No Blood in stool or Urine, No dysuria, No new skin rashes or bruises, No new joints pains-aches,  No new weakness, tingling, numbness in any extremity, No recent weight gain or loss, No polyuria, polydypsia or polyphagia,  A full 10 point Review of Systems was done, except as stated above, all other Review of Systems were negative.  Blood pressure 139/90, pulse 80, temperature 97.6 F (36.4 C), temperature source Oral, resp. rate 20, height '5\' 6"'$  (1.676 m),  weight 100.5 kg (221 lb 8 oz), last menstrual period 01/21/2017, SpO2 94 %.Body mass index is 35.75 kg/m.  General Appearance: Casual  Eye Contact:  Good  Speech:  Clear and Coherent  Volume:  Normal  Mood:  Euthymic  Affect:  Appropriate and Congruent  Thought Process:  Coherent and Goal Directed  Orientation:  Full (Time, Place, and Person)  Thought Content:  WDL  Suicidal Thoughts:  No  Homicidal Thoughts:  No  Memory:  Immediate;   Good Recent;   Fair Remote;   Fair  Judgement:  Impaired  Insight:  Good  Psychomotor Activity:  Normal  Concentration:  Concentration: Good and Attention Span: Good  Recall:  Slater of Knowledge:  Good  Language:  Good  Akathisia:  Negative  Handed:  Right  AIMS (if indicated):     Assets:  Communication Skills Desire for Improvement Financial Resources/Insurance Housing Intimacy Leisure Time Resilience Social Support Talents/Skills Transportation Vocational/Educational  ADL's:  Intact  Cognition:  WNL  Sleep:        Treatment Plan Summary: 36 years old female who is married presented with the bronchitis, status post intentional drug overdose with the gabapentin and Latuda while intoxicated with whiskey. Patient feels regrets about her behavior and contract for safety. Patient has no safety concerns.   Diagnosis: Bipolar 1 disorder most recent episode depression  Alcohol abuse   Case discussed with internal medicine resident Discontinue  Air cabin crew as patient contract for safety and denies active suicidal/homicidal ideation  Recommendation:  Patient husband is willing to provide medication administration daily and not to give Axis II rest of the medications by using a negative locked cabinet Hold gabapentin and Latuda as patient recently overdosed intentionally while intoxicated. No new prescription for those medication will be given during this hospitalization  Recommended schedule appointment with outpatient  psychiatrist as well as possible to discuss about required medication management for controlling her symptoms bipolar depression Appreciate psychiatric consultation and we sign off as of today Please contact [phone] or [phone] if needs further assistance  Disposition: Patient is psychiatrically cleared for the discharge to outpatient psychiatric medication management Supportive therapy provided about ongoing stressors.  Ambrose Finland, MD 01/24/2017 11:56 AM

## 2017-01-24 NOTE — Progress Notes (Signed)
[name] to be D/C'd Home per MD order.  Discussed prescriptions and follow up appointments with the patient. Prescriptions given to patient, medication list explained in detail. Pt verbalized understanding.  Allergies as of 01/24/2017      Reactions   Orange Oil Shortness Of Breath   Carisoprodol    Skin peeled   Piroxicam    Skin peeled      Medication List    TAKE these medications   albuterol 108 (90 Base) MCG/ACT inhaler Commonly known as:  PROVENTIL HFA;VENTOLIN HFA Inhale 1-2 puffs into the lungs every 6 (six) hours as needed for wheezing or shortness of breath.   ARIPiprazole 5 MG tablet Commonly known as:  ABILIFY Take 1 tablet (5 mg total) by mouth daily. For major depressive disorder.   gabapentin 300 MG capsule Commonly known as:  NEURONTIN Take 1-2 capsules (300-600 mg total) by mouth 3 (three) times daily. Only take as prescribed.  Husband is to administer medication What changed:  additional instructions   lurasidone 80 MG Tabs tablet Commonly known as:  LATUDA Take 1 tablet (80 mg total) by mouth daily with breakfast. Only take as prescribed.  Husband is to administer medication. What changed:  additional instructions   REXULTI PO Take 1 tablet by mouth daily. Dose unknown. ( pt gets at [facility])   sertraline 100 MG tablet Commonly known as:  ZOLOFT Take 2 tablets (200 mg total) by mouth daily. For depression and anxiety.   traZODone 100 MG tablet Commonly known as:  DESYREL Take 200 mg by mouth at bedtime as needed for sleep. For insomnia.       Vitals:   01/24/17 0510 01/24/17 1101  BP: (!) 155/92 139/90  Pulse: 84 80  Resp: 18 20  Temp: 97.6 F (36.4 C)     Skin clean, dry and intact without evidence of skin break down, no evidence of skin tears noted. IV catheter discontinued intact. Site without signs and symptoms of complications. Dressing and pressure applied. Pt denies pain at this time. No complaints noted.  An After  Visit Summary was printed and given to the patient. Patient escorted via WC, and D/C home via private auto.  Britt Bolognese RN, BSN

## 2017-01-24 NOTE — Progress Notes (Signed)
   Subjective: Patient was evaluated this morning on rounds. She was IVC'd overnight because she wanted to leave prior to a psychiatric evaluation.  She currently reports no suicidal ideation. She denies any abdominal pain.  She reports some wheezing since she has not gotten her inhalers.    Objective:  Vital signs in last 24 hours: Vitals:   01/23/17 1114 01/23/17 2354 01/24/17 0510 01/24/17 1101  BP: 125/62 115/66 (!) 155/92 139/90  Pulse: 96 84 84 80  Resp: Temp: 98.3 F (36.8 C) 98.7 F (37.1 C) 97.6 F (36.4 C)   TempSrc: Oral Oral Oral   SpO2: 95% 98% 95% 94%  Weight:  221 lb 8 oz (100.5 kg)    Height:       Physical Exam  Constitutional: She is well-developed, well-nourished, and in no distress.  Cardiovascular: Normal rate, regular rhythm and normal heart sounds.   Pulmonary/Chest: Effort normal and breath sounds normal. No respiratory distress. She has no wheezes. She has no rales.  Abdominal: Soft. Bowel sounds are normal. She exhibits no distension. There is no tenderness.  Musculoskeletal: She exhibits no edema.  Skin: Skin is warm and dry.  Psychiatric: Mood and affect normal.    Assessment/Plan:  Principal Problem:   Overdose of antipsychotic Active Problems:   INSOMNIA   Alcohol abuse, episodic drinking behavior   Bipolar I disorder, most recent episode depressed (HCC)  Intentional Overdose of Medications She currently denies suicidal ideation. Because she intentionally overdosed on gabapentin and Latuda and has a history of suicide attempt in the past (2012) would recommend psychiatric evaluation. She is otherwise hemodynamically and medically stable without evidence of thyroid dysfunction, rhabdomyolysis, renal dysfunction or cardiac arrhythmia.   - Psychiatry consulted, appreciate assistance - Continue to monitor   Hyperglycemia Blood sugars were elevated on admission. This may be secondary to [facility] which can cause hyperglycemia. Hgb A1c  was 7.9.  Will need to be evaluated outpatient to discuss medical management of her hyperglycemia. - Continue SSI TIDAC + HS  Recent upper respiratory infection Patient was recently admitted on 01/17/17 for bronchitis.  - DuoNeb's Q6H PRN - Albuterol Q6H PRN   Dispo: Anticipated discharge pending psychiatry evaluation.   Camelia Phenes, DO 01/24/2017, 11:08 AM Pager: [phone]

## 2017-01-24 NOTE — Discharge Summary (Signed)
Name: [name] MRN: [id number] DOB: 04-Apr-1981 36 y.o. PCP: No Pcp Per Patient  Date of Admission: 01/22/2017 10:42 PM Date of Discharge: 01/24/2017 Attending Physician: Dr. Heide Spark  Discharge Diagnosis: 1. Overdose of antipsychotic  Principal Problem:   Overdose of antipsychotic Active Problems:   INSOMNIA   Alcohol abuse, episodic drinking behavior   Bipolar I disorder, most recent episode depressed [facility])   Discharge Medications: Allergies as of 01/24/2017      Reactions   Orange Oil Shortness Of Breath   Carisoprodol    Skin peeled   Piroxicam    Skin peeled      Medication List    TAKE these medications   albuterol 108 (90 Base) MCG/ACT inhaler Commonly known as:  PROVENTIL HFA;VENTOLIN HFA Inhale 1-2 puffs into the lungs every 6 (six) hours as needed for wheezing or shortness of breath.   ARIPiprazole 5 MG tablet Commonly known as:  ABILIFY Take 1 tablet (5 mg total) by mouth daily. For major depressive disorder.   gabapentin 300 MG capsule Commonly known as:  NEURONTIN Take 1-2 capsules (300-600 mg total) by mouth 3 (three) times daily. Only take as prescribed.  Husband is to administer medication What changed:  additional instructions   lurasidone 80 MG Tabs tablet Commonly known as:  LATUDA Take 1 tablet (80 mg total) by mouth daily with breakfast. Only take as prescribed.  Husband is to administer medication. What changed:  additional instructions   REXULTI PO Take 1 tablet by mouth daily. Dose unknown. ( pt gets at [facility])   sertraline 100 MG tablet Commonly known as:  ZOLOFT Take 2 tablets (200 mg total) by mouth daily. For depression and anxiety.   traZODone 100 MG tablet Commonly known as:  DESYREL Take 200 mg by mouth at bedtime as needed for sleep. For insomnia.       Disposition and follow-up:   [name] was discharged from [facility] in stable condition.  At the hospital follow up  visit please address:  1.  Patient had an intentional overdose of antipsychotic. Please assess if there needs to be a change in psychiatric medications. 2. Hyperglycemia, patient's blood glucose levels were labile with a hemoglobin A1C of 7.9  2.  Labs / imaging needed at time of follow-up: CMP and CBC  3.  Pending labs/ test needing follow-up: none  Follow-up Appointments: Follow-up Information    [facility]. Call.   Specialty:  Behavioral Health Contact information: [address] [place] [address] [phone]        [facility]. Schedule an appointment as soon as possible for a visit.   Contact information: [address] [place] [address] [phone]          Hospital Course by problem list: Principal Problem:   Overdose of antipsychotic Active Problems:   INSOMNIA   Alcohol abuse, episodic drinking behavior   Bipolar I disorder, most recent episode depressed (HCC)   1. Overdose of antipsychotic medications   Patient has a long history of depression and was brought to the ED by her sister for an intentional overdose of gabapentin and Latuda.  Patient reports having an argument with her husband and subsequently taking 80 pills of gabapentin and 40 pills of Latuda to help her sleep.  She denies intention to harm herself or suicidal ideation. She did not lose consciousness and denied nausea/vomiting, abdominal pain, chest pain and shortness of breath.  During admission patient was alert and oriented to person place and time.  There was no evidence of renal dysfunction, no transaminitis, acetaminophen level and aspirin levels were undetectable.  ECGs were appropriate.  Gabapentin and Latuda were held during admission.  Psychiatry was consulted.  Patient contracted for safety and denied active suicidal/homicidal ideation. Patient's husband is willing to provide medication administration daily to patient and have  medications under lock. Patient was advised to follow-up with her outpatient psychiatrist at [facility].    2. Hyperglycemia Blood sugars were elevated on admission. This may be secondary to [facility] which can cause hyperglycemia. Hgb A1c was 7.9.  Will need to be evaluated outpatient to discuss medical management of her hyperglycemia.  She was on sliding scale insulin during her stay.  3. Recent upper respiratory infection Patient was recently admitted on 01/17/17 for bronchitis. DuoNeb's and albuterol were continued during admission.  Discharge Vitals:   BP 139/90 (BP Location: Left Arm)   Pulse 80   Temp 97.6 F (36.4 C) (Oral)   Resp 20   Ht  (1.676 m)   Wt 221 lb 8 oz (100.5 kg)   LMP 01/21/2017   SpO2 94%   BMI 35.75 kg/m    Discharge Instructions: Discharge Instructions    Diet - low sodium heart healthy    Complete by:  As directed    Discharge instructions    Complete by:  As directed    [name],  Please follow up with [facility] or your psychiatrist on discharge. Only take your medications as prescribed.  Per psychiatry please have your husband administer your medications.   Increase activity slowly    Complete by:  As directed       Signed: Camelia Phenes, DO 01/25/2017, 8:22 AM   Pager: [phone]

## 2017-05-06 ENCOUNTER — Encounter (HOSPITAL_COMMUNITY): Payer: Self-pay | Admitting: *Deleted

## 2017-05-06 ENCOUNTER — Emergency Department (HOSPITAL_COMMUNITY)
Admission: EM | Admit: 2017-05-06 | Discharge: 2017-05-06 | Disposition: A | Discharge status: Home / Self Care | Payer: Medicaid Other | Attending: Emergency Medicine | Admitting: Emergency Medicine

## 2017-05-06 DIAGNOSIS — E1165 Type 2 diabetes mellitus with hyperglycemia: Secondary | ICD-10-CM | POA: Insufficient documentation

## 2017-05-06 DIAGNOSIS — W19XXXA Unspecified fall, initial encounter: Secondary | ICD-10-CM

## 2017-05-06 DIAGNOSIS — Y939 Activity, unspecified: Secondary | ICD-10-CM | POA: Insufficient documentation

## 2017-05-06 DIAGNOSIS — F17210 Nicotine dependence, cigarettes, uncomplicated: Secondary | ICD-10-CM | POA: Insufficient documentation

## 2017-05-06 DIAGNOSIS — Z7984 Long term (current) use of oral hypoglycemic drugs: Secondary | ICD-10-CM | POA: Insufficient documentation

## 2017-05-06 DIAGNOSIS — Y929 Unspecified place or not applicable: Secondary | ICD-10-CM | POA: Insufficient documentation

## 2017-05-06 DIAGNOSIS — S300XXA Contusion of lower back and pelvis, initial encounter: Secondary | ICD-10-CM | POA: Insufficient documentation

## 2017-05-06 DIAGNOSIS — Z79899 Other long term (current) drug therapy: Secondary | ICD-10-CM | POA: Insufficient documentation

## 2017-05-06 DIAGNOSIS — Y999 Unspecified external cause status: Secondary | ICD-10-CM | POA: Insufficient documentation

## 2017-05-06 DIAGNOSIS — W010XXA Fall on same level from slipping, tripping and stumbling without subsequent striking against object, initial encounter: Secondary | ICD-10-CM | POA: Insufficient documentation

## 2017-05-06 LAB — BASIC METABOLIC PANEL
Anion gap: 10 (ref 5–15)
CHLORIDE: 98 mmol/L — AB (ref 101–111)
CO2: 25 mmol/L (ref 22–32)
CREATININE: 0.8 mg/dL (ref 0.44–1.00)
Calcium: 9.4 mg/dL (ref 8.9–10.3)
GFR calc non Af Amer: >60 mL/min (ref >60)
GLUCOSE: 366 mg/dL — AB (ref 65–99)
POTASSIUM: 3.7 mmol/L (ref 3.5–5.1)
SODIUM: 133 mmol/L — AB (ref 135–145)

## 2017-05-06 LAB — CBG MONITORING, ED
Glucose-Capillary: 397 mg/dL — ABNORMAL HIGH (ref 65–99)
Glucose-Capillary: 335 mg/dL — ABNORMAL HIGH (ref 65–99)
Glucose-Capillary: 201 mg/dL — ABNORMAL HIGH (ref 65–99)

## 2017-05-06 LAB — CBC
HEMATOCRIT: 41.2 % (ref 36.0–46.0)
Hemoglobin: 14.7 g/dL (ref 12.0–15.0)
MCH: 30.8 pg (ref 26.0–34.0)
MCHC: 35.7 g/dL (ref 30.0–36.0)
MCV: 86.4 fL (ref 78.0–100.0)
PLATELETS: 371 10*3/uL (ref 150–400)
RBC: 4.77 MIL/uL (ref 3.87–5.11)
RDW: 12.4 % (ref 11.5–15.5)
WBC: 12.4 10*3/uL — ABNORMAL HIGH (ref 4.0–10.5)

## 2017-05-06 LAB — URINALYSIS, ROUTINE W REFLEX MICROSCOPIC
Bacteria, UA: NONE SEEN
Bilirubin Urine: NEGATIVE
Glucose, UA: >=500 mg/dL — AB
Hgb urine dipstick: NEGATIVE
KETONES UR: NEGATIVE mg/dL
LEUKOCYTES UA: NEGATIVE
Nitrite: NEGATIVE
PH: 6 (ref 5.0–8.0)
Protein, ur: NEGATIVE mg/dL
SPECIFIC GRAVITY, URINE: 1.033 — AB (ref 1.005–1.030)

## 2017-05-06 MED ORDER — INSULIN ASPART 100 UNIT/ML ~~LOC~~ SOLN
6.0000 [IU] | Freq: Once | SUBCUTANEOUS | Status: AC
  Administered 2017-05-06: 6 [IU] via INTRAVENOUS

## 2017-05-06 MED ORDER — ACETAMINOPHEN 325 MG PO TABS: 650.0000 mg | ORAL_TABLET | Freq: Four times a day (QID) | ORAL | 0 refills | Status: DC | PRN

## 2017-05-06 MED ORDER — SODIUM CHLORIDE 0.9 % IV BOLUS (SEPSIS)
1000.0000 mL | Freq: Once | INTRAVENOUS | Status: AC
  Administered 2017-05-06: 1000 mL via INTRAVENOUS

## 2017-05-06 MED ORDER — METFORMIN HCL 500 MG PO TABS: 500.0000 mg | ORAL_TABLET | Freq: Two times a day (BID) | ORAL | 1 refills | Status: DC

## 2017-05-06 NOTE — ED Triage Notes (Signed)
Pt in c/o diaphoresis and hyperglycemia today with dizziness, pt states, " I was in the hospital in APril and they were giving me Insulin for it, but I never was told I was a diabetic. I bought a monitor offline and don't have insurance and they never gave me a prescription for insulin." pt reports falling down the stairs yesterday, c/o sacral pain 8/10, pt denies hitting head, denies LOC, MAE, ambulatory, A&O x4

## 2017-05-06 NOTE — ED Provider Notes (Signed)
[facility]-EMERGENCY DEPT Provider Note   CSN: [id number] Arrival date & time: 05/06/17  1016     History   Chief Complaint Chief Complaint  Patient presents with  . Hyperglycemia    HPI [name] is a 36 y.o. female.  [name] is a 36 y.o. Female who presents to the emergency room complaining of high blood sugars as well as a slip and fall yesterday. Patient reports she was admitted to the hospital back in April of this year for an overdose. They were providing her with insulin during her admission and encouraged her to follow-up with primary care for management of her hyperglycemia. She does not have insurance and she did not follow-up. Her hemoglobin A1c at this admission was 7.9. She tells me she has a glucometer at home and has been checking her blood sugars that range from 240 and 280. She is never been diagnosed with diabetes previously. She reports yesterday she slipped on some wet stairs falling down on her bottom. She denies hitting her head or LOC. She complains of pain to her left buttocks that is worse with sitting. No treatments attempted prior to arrival. She denies fevers, coughing, chest pain, shortness of breath, abdominal pain, nausea, diarrhea, dysuria, loss of bladder control, loss of bowel control, numbness, tingling, weakness, lightheadedness or dizziness.   The history is provided by the patient and medical records. No language interpreter was used.  Hyperglycemia  Associated symptoms: polyuria   Associated symptoms: no abdominal pain, no chest pain, no dizziness, no dysuria, no fever, no nausea, no shortness of breath, no vomiting and no weakness     Past Medical History:  Diagnosis Date  . Anxiety   . Back pain   . CAP (community acquired pneumonia)   . Chronic pain syndrome   . Depression   . Gallbladder problem   . Obsessive compulsive disorder     Patient Active Problem List   Diagnosis Date Noted  . Overdose of  antipsychotic 01/23/2017  . Hypoxia 01/17/2017  . Bipolar I disorder, most recent episode depressed (HCC) 06/11/2013  . Depression with suicidal ideation 06/09/2013  . Alcohol abuse, episodic drinking behavior 06/09/2013  . Major depressive disorder, recurrent episode, severe, specified as with psychotic behavior 06/09/2013  . 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 SNORING 07/18/2009  . OVARIAN CYST 06/13/2009  . HYPERCHOLESTEROLEMIA 09/19/2008  . BACK PAIN 09/18/2008  . TOBACCO ABUSE 08/13/2008  . INSOMNIA 08/13/2008    Past Surgical History:  Procedure Laterality Date  . CESAREAN SECTION    . CHOLECYSTECTOMY  2009  . wisdom teeth extraction      OB History    Gravida Para Term Preterm AB Living   1 1 1     1    SAB TAB Ectopic Multiple Live Births           1       Home Medications    Prior to Admission medications   Medication Sig Start Date End Date Taking? Authorizing Provider  albuterol (PROVENTIL HFA;VENTOLIN HFA) 108 (90 Base) MCG/ACT inhaler Inhale 1-2 puffs into the lungs every 6 (six) hours as needed for wheezing or shortness of breath. 01/18/17  Yes Hoffman, Jessica Ratliff, DO  ARIPiprazole (ABILIFY) 5 MG tablet Take 1 tablet (5 mg total) by mouth daily. For major depressive disorder. 06/13/13  Yes Mashburn, Rona Ravens, PA-C  Brexpiprazole (REXULTI PO) Take 0.5 tablets by mouth daily. Dose unknown. ( pt  gets at [facility])    Yes [provider]  gabapentin (NEURONTIN) 300 MG capsule Take 1-2 capsules (300-600 mg total) by mouth 3 (three) times daily. Only take as prescribed.  Husband is to administer medication Patient taking differently: Take 300-600 mg by mouth 2 (two) times daily. Only take as prescribed.  Husband is to administer medication 01/24/17  Yes Hoffman, Jessica Ratliff, DO  lurasidone (LATUDA) 80 MG TABS tablet Take 1 tablet (80 mg total) by mouth daily with breakfast. Only take as prescribed.   Husband is to administer medication. 01/24/17  Yes Hoffman, Jessica Ratliff, DO  sertraline (ZOLOFT) 100 MG tablet Take 2 tablets (200 mg total) by mouth daily. For depression and anxiety. 06/13/13  Yes Mashburn, Rona RavensNeil T, PA-C  traZODone (DESYREL) 100 MG tablet Take 200 mg by mouth at bedtime as needed for sleep. For insomnia.  06/13/13  Yes Mashburn, Rona RavensNeil T, PA-C  acetaminophen (TYLENOL) 325 MG tablet Take 2 tablets (650 mg total) by mouth every 6 (six) hours as needed for mild pain or moderate pain. 05/06/17   Everlene Farrieransie, Lynore Coscia, PA-C  metFORMIN (GLUCOPHAGE) 500 MG tablet Take 1 tablet (500 mg total) by mouth 2 (two) times daily with a meal. 05/06/17   Everlene Farrieransie, Kairyn Olmeda, PA-C    Family History Family History  Problem Relation Age of Onset  . Diabetes insipidus Father   . Arthritis Father   . Heart failure Father   . COPD Father   . Emphysema Father   . Asthma Son     Social History Social History  Substance Use Topics  . Smoking status: Current Every Day Smoker    Packs/day: 0.50    Years: 23.00    Types: Cigarettes  . Smokeless tobacco: Never Used  . Alcohol use Yes     Comment: unsure amount (1/6) - states generally does not drink     Allergies   Orange oil; Carisoprodol; and Piroxicam   Review of Systems Review of Systems  Constitutional: Negative for chills and fever.  HENT: Negative for congestion and sore throat.   Eyes: Negative for visual disturbance.  Respiratory: Negative for cough, shortness of breath and wheezing.   Cardiovascular: Negative for chest pain and palpitations.  Gastrointestinal: Negative for abdominal pain, diarrhea, nausea and vomiting.  Endocrine: Positive for polyuria.  Genitourinary: Positive for frequency. Negative for dysuria.  Musculoskeletal: Positive for arthralgias. Negative for back pain and neck pain.  Skin: Negative for rash.  Neurological: Negative for dizziness, syncope, weakness, numbness and headaches.     Physical Exam Updated Vital  Signs BP 124/81   Pulse 91   Temp 98.1 F (36.7 C) (Oral)   Resp 16   SpO2 93%   Physical Exam  Constitutional: She is oriented to person, place, and time. She appears well-developed and well-nourished. No distress.  Nontoxic appearing. Overweight female.  HENT:  Head: Normocephalic and atraumatic.  Right Ear: External ear normal.  Left Ear: External ear normal.  Mouth/Throat: Oropharynx is clear and moist.  No visible or palpated signs of head injury or trauma. Bilateral tympanic membranes are pearly-gray without erythema or loss of landmarks.   Eyes: Pupils are equal, round, and reactive to light. Conjunctivae and EOM are normal. Right eye exhibits no discharge. Left eye exhibits no discharge.  Neck: Neck supple. No JVD present.  Cardiovascular: Normal rate, regular rhythm, normal heart sounds and intact distal pulses.  Exam reveals no gallop and no friction rub.   No murmur heard. Bilateral radial, posterior tibialis  and dorsalis pedis pulses are intact.    Pulmonary/Chest: Effort normal and breath sounds normal. No stridor. No respiratory distress. She has no wheezes. She has no rales.  Lungs are clear to ascultation bilaterally. Symmetric chest expansion bilaterally. No increased work of breathing. No rales or rhonchi.    Abdominal: Soft. There is no tenderness. There is no guarding.  Musculoskeletal: Normal range of motion. She exhibits tenderness. She exhibits no edema or deformity.  Tenderness over her left buttocks where there is an area of ecchymosis. No midline neck or back tenderness to palpation. No back erythema, deformity. Good strength in her bilateral upper and lower extremities without deformity.  Lymphadenopathy:    She has no cervical adenopathy.  Neurological: She is alert and oriented to person, place, and time. No cranial nerve deficit or sensory deficit. She exhibits normal muscle tone. Coordination normal.  Patient is alert and oriented 3. Cranial nerves are  intact. Speech is clear coherent. No pronator drift. Finger to nose intact bilaterally. Vision is grossly intact. Sensation and strength is intact in her bilateral upper and lower extremities.  Skin: Skin is warm and dry. Capillary refill takes less than 2 seconds. No rash noted. She is not diaphoretic. No erythema. No pallor.  Psychiatric: She has a normal mood and affect. Her behavior is normal.  Nursing note and vitals reviewed.    ED Treatments / Results  Labs (all labs ordered are listed, but only abnormal results are displayed) Labs Reviewed  BASIC METABOLIC PANEL - Abnormal; Notable for the following:       Result Value   Sodium 133 (*)    Chloride 98 (*)    Glucose, Bld 366 (*)    BUN <5 (*)    All other components within normal limits  CBC - Abnormal; Notable for the following:    WBC 12.4 (*)    All other components within normal limits  URINALYSIS, ROUTINE W REFLEX MICROSCOPIC - Abnormal; Notable for the following:    Specific Gravity, Urine 1.033 (*)    Glucose, UA >=500 (*)    Squamous Epithelial / LPF 0-5 (*)    All other components within normal limits  CBG MONITORING, ED - Abnormal; Notable for the following:    Glucose-Capillary 397 (*)    All other components within normal limits  CBG MONITORING, ED - Abnormal; Notable for the following:    Glucose-Capillary 335 (*)    All other components within normal limits  CBG MONITORING, ED - Abnormal; Notable for the following:    Glucose-Capillary 201 (*)    All other components within normal limits    EKG  EKG Interpretation None       Radiology No results found.  Procedures Procedures (including critical care time)  Medications Ordered in ED Medications  sodium chloride 0.9 % bolus 1,000 mL (1,000 mLs Intravenous New Bag/Given 05/06/17 1335)  insulin aspart (novoLOG) injection 6 Units (6 Units Intravenous Given 05/06/17 1335)     Initial Impression / Assessment and Plan / ED Course  I have reviewed  the triage vital signs and the nursing notes.  Pertinent labs & imaging results that were available during my care of the patient were reviewed by me and considered in my medical decision making (see chart for details).    This is a 36 y.o. Female who presents to the emergency room complaining of high blood sugars as well as a slip and fall yesterday. Patient reports she was admitted to the hospital  back in April of this year for an overdose. They were providing her with insulin during her admission and encouraged her to follow-up with primary care for management of her hyperglycemia. She does not have insurance and she did not follow-up. Her hemoglobin A1c at this admission was 7.9. She tells me she has a glucometer at home and has been checking her blood sugars that range from 240 and 280. She is never been diagnosed with diabetes previously. She reports yesterday she slipped on some wet stairs falling down on her bottom. She denies hitting her head or LOC. She complains of pain to her left buttocks that is worse with sitting.  On exam the patient is afebrile and nontoxic appearing. Mucous membranes are moist. No focal neurological deficits. No midline neck or back tenderness. She does have an area of ecchymosis noted to her left buttocks. No crepitus or deformity. Normal gait. I see no need for any imaging as patient's pain is limited to her buttocks. No focal deficits. Blood work here reveals a blood sugar of 366. Good kidney function. Normal anion gap. No evidence of DKA. We'll provide with fluid bolus and insulin to bring her glucose down. Repeat blood sugar is 201. Patient with diabetes. She was requiring insulin at last admission. Will start the patient on metformin. 500 twice a day. We'll provide her with follow-up with the wellness [facility]. They'll see her without insurance. She has a glucometer at home. I encouraged her to continue taking her blood sugars and to keep a log of them. I also  discussed diet instructions to help with her blood sugar. I discussed the expected side effects of metformin. I discussed that she maintain require further management of metformin by primary care. Will have her see wellness Center. I advised the patient to follow-up with their primary care provider this week. I advised the patient to return to the emergency department with new or worsening symptoms or new concerns. The patient verbalized understanding and agreement with plan.     This patient was discussed with Dr. Rosalia Hammersay who agrees with assessment and plan.   Final Clinical Impressions(s) / ED Diagnoses   Final diagnoses:  Type 2 diabetes mellitus with hyperglycemia, without long-term current use of insulin (HCC)  Fall, initial encounter  Contusion of buttock, initial encounter    New Prescriptions New Prescriptions   ACETAMINOPHEN (TYLENOL) 325 MG TABLET    Take 2 tablets (650 mg total) by mouth every 6 (six) hours as needed for mild pain or moderate pain.   METFORMIN (GLUCOPHAGE) 500 MG TABLET    Take 1 tablet (500 mg total) by mouth 2 (two) times daily with a meal.     Everlene FarrierDansie, Shilo Pauwels, PA-C 05/06/17 1454    Margarita Grizzleay, Danielle, MD 05/07/17 [phone]

## 2017-05-10 ENCOUNTER — Encounter: Payer: Self-pay | Admitting: Family Medicine

## 2017-05-10 ENCOUNTER — Ambulatory Visit (INDEPENDENT_AMBULATORY_CARE_PROVIDER_SITE_OTHER): Payer: Self-pay | Admitting: Family Medicine

## 2017-05-10 VITALS — BP 130/84 | HR 96 | Temp 98.6°F | Resp 16 | Ht 66.0 in | Wt 218.8 lb

## 2017-05-10 DIAGNOSIS — E7800 Pure hypercholesterolemia, unspecified: Secondary | ICD-10-CM

## 2017-05-10 DIAGNOSIS — E119 Type 2 diabetes mellitus without complications: Secondary | ICD-10-CM

## 2017-05-10 LAB — CBC WITH DIFFERENTIAL/PLATELET
BASOS ABS: 107 {cells}/uL (ref 0–200)
Basophils Relative: 1 %
EOS PCT: 2 %
Eosinophils Absolute: 214 cells/uL (ref 15–500)
HEMATOCRIT: 45.7 % — AB (ref 35.0–45.0)
HEMOGLOBIN: 15.5 g/dL (ref 11.7–15.5)
LYMPHS ABS: 3638 {cells}/uL (ref 850–3900)
Lymphocytes Relative: 34 %
MCH: 31 pg (ref 27.0–33.0)
MCHC: 33.9 g/dL (ref 32.0–36.0)
MCV: 91.4 fL (ref 80.0–100.0)
MONO ABS: 642 {cells}/uL (ref 200–950)
MPV: 8.9 fL (ref 7.5–12.5)
Monocytes Relative: 6 %
NEUTROS ABS: 6099 {cells}/uL (ref 1500–7800)
Neutrophils Relative %: 57 %
Platelets: 393 10*3/uL (ref 140–400)
RBC: 5 MIL/uL (ref 3.80–5.10)
RDW: 13.6 % (ref 11.0–15.0)
WBC: 10.7 10*3/uL (ref 3.8–10.8)

## 2017-05-10 LAB — POCT URINALYSIS DIP (DEVICE)
BILIRUBIN URINE: NEGATIVE
GLUCOSE, UA: 500 mg/dL — AB
Hgb urine dipstick: NEGATIVE
KETONES UR: NEGATIVE mg/dL
Leukocytes, UA: NEGATIVE
Nitrite: NEGATIVE
PH: 6.5 (ref 5.0–8.0)
PROTEIN: NEGATIVE mg/dL
SPECIFIC GRAVITY, URINE: 1.01 (ref 1.005–1.030)
Urobilinogen, UA: 0.2 mg/dL (ref 0.0–1.0)

## 2017-05-10 MED ORDER — METFORMIN HCL 1000 MG PO TABS: 1000.0000 mg | ORAL_TABLET | Freq: Two times a day (BID) | ORAL | 1 refills | Status: DC

## 2017-05-10 MED ORDER — INSULIN PEN NEEDLE 30G X 8 MM MISC: 1.0000 | 2 refills | Status: DC | PRN

## 2017-05-10 MED ORDER — LISINOPRIL 2.5 MG PO TABS: 2.5000 mg | ORAL_TABLET | Freq: Every day | ORAL | 2 refills | Status: AC

## 2017-05-10 MED ORDER — INSULIN GLARGINE 100 UNIT/ML SOLOSTAR PEN
10.0000 [IU] | PEN_INJECTOR | Freq: Every day | SUBCUTANEOUS | 2 refills | Status: DC
Start: 2017-05-10 — End: 2017-06-14

## 2017-05-10 MED ORDER — BLOOD GLUCOSE MONITOR KIT: PACK | 0 refills | Status: DC

## 2017-05-10 MED ORDER — ASPIRIN EC 81 MG PO TBEC: 81.0000 mg | DELAYED_RELEASE_TABLET | Freq: Every day | ORAL | 3 refills | Status: DC

## 2017-05-10 MED FILL — TRUEPLUS PEN NDL 31GX5/16: 31G X 8 MM | 50 days supply | Qty: 100 | Fill #0

## 2017-05-10 MED FILL — LISINOPRIL 2.5 MG TABLET: 2.5 | 90 days supply | Qty: 90 | Fill #0

## 2017-05-10 MED FILL — LANTUS SOLOSTAR 100 UNITS/M: 100 | 28 days supply | Qty: 3 | Fill #0

## 2017-05-10 MED FILL — TRUE METRIX TEST STRIP: 25 days supply | Qty: 100 | Fill #0

## 2017-05-10 MED FILL — !TRUE METRIX BLOOD GLUCOSE: 365 days supply | Qty: 1 | Fill #0

## 2017-05-10 MED FILL — metFORMIN HCL 1000 MG TABS: 1000 | 90 days supply | Qty: 180 | Fill #0

## 2017-05-10 MED FILL — TRUEPLUS PEN NDL 31GX5/16": 31G X 8 MM | 50 days supply | Qty: 100 | Fill #0

## 2017-05-10 MED FILL — TRUEplus LANCETS 28G MISC: 25 days supply | Qty: 100 | Fill #0

## 2017-05-10 NOTE — Patient Instructions (Addendum)
Your hemoglobin A1C today is 9.1. Your goal A1C is <7. Check your blood sugar at least once in the morning fasting and keep a log of the readings, bring readings with you to your next visit.  The following are your medication order this visit. Please take/adminsiter as prescribed: You will inject 10 units of Lantus once daily at bedtime only and eat a high protein snack: meat, cheese, or peanut butter.  Meds ordered this encounter  Medications  . metFORMIN (GLUCOPHAGE) 1000 MG tablet    Sig: Take 1 tablet (1,000 mg total) by mouth 2 (two) times daily with a meal.    Dispense:  180 tablet    Refill:  1    Order Specific Question:   Supervising Provider    Answer:   Tresa Garter [id number]  . Insulin Glargine (LANTUS SOLOSTAR) 100 UNIT/ML Solostar Pen    Sig: Inject 10 Units into the skin daily at 10 pm.    Dispense:  5 pen    Refill:  2    Order Specific Question:   Supervising Provider    Answer:   Tresa Garter [id number]  . Insulin Pen Needle (NOVOFINE) 30G X 8 MM MISC    Sig: Inject 10 each into the skin as needed.    Dispense:  100 each    Refill:  2    Order Specific Question:   Supervising Provider    Answer:   Tresa Garter [id number]  . blood glucose meter kit and supplies KIT    Sig: Dispense based on patient and insurance preference. Use up to four times daily as directed. (FOR ICD-9 250.00, 250.01).    Dispense:  1 each    Refill:  0    Order Specific Question:   Supervising Provider    Answer:   Tresa Garter [id number]    Order Specific Question:   Number of strips    Answer:   150    Order Specific Question:   Number of lancets    Answer:   150  . aspirin EC 81 MG tablet    Sig: Take 1 tablet (81 mg total) by mouth daily.    Dispense:  90 tablet    Refill:  3    Order Specific Question:   Supervising Provider    Answer:   Tresa Garter [id number]  . lisinopril (PRINIVIL,ZESTRIL) 2.5 MG tablet    Sig: Take 1 tablet (2.5 mg total)  by mouth daily.    Dispense:  90 tablet    Refill:  2    Order Specific Question:   Supervising Provider    Answer:   Tresa Garter [[id number]]     Diabetes Mellitus and Exercise Exercising regularly is important for your overall health, especially when you have diabetes (diabetes mellitus). Exercising is not only about losing weight. It has many health benefits, such as increasing muscle strength and bone density and reducing body fat and stress. This leads to improved fitness, flexibility, and endurance, all of which result in better overall health. Exercise has additional benefits for people with diabetes, including:  Reducing appetite.  Helping to lower and control blood glucose.  Lowering blood pressure.  Helping to control amounts of fatty substances (lipids) in the blood, such as cholesterol and triglycerides.  Helping the body to respond better to insulin (improving insulin sensitivity).  Reducing how much insulin the body needs.  Decreasing the risk for heart disease by: ? Lowering cholesterol and triglyceride levels. ?  Increasing the levels of good cholesterol. ? Lowering blood glucose levels.  What is my activity plan? Your health care provider or certified diabetes educator can help you make a plan for the type and frequency of exercise (activity plan) that works for you. Make sure that you:  Do at least 150 minutes of moderate-intensity or vigorous-intensity exercise each week. This could be brisk walking, biking, or water aerobics. ? Do stretching and strength exercises, such as yoga or weightlifting, at least 2 times a week. ? Spread out your activity over at least 3 days of the week.  Get some form of physical activity every day. ? Do not go more than 2 days in a row without some kind of physical activity. ? Avoid being inactive for more than 90 minutes at a time. Take frequent breaks to walk or stretch.  Choose a type of exercise or activity that you  enjoy, and set realistic goals.  Start slowly, and gradually increase the intensity of your exercise over time.  What do I need to know about managing my diabetes?  Check your blood glucose before and after exercising. ? If your blood glucose is higher than 240 mg/dL (13.3 mmol/L) before you exercise, check your urine for ketones. If you have ketones in your urine, do not exercise until your blood glucose returns to normal.  Know the symptoms of low blood glucose (hypoglycemia) and how to treat it. Your risk for hypoglycemia increases during and after exercise. Common symptoms of hypoglycemia can include: ? Hunger. ? Anxiety. ? Sweating and feeling clammy. ? Confusion. ? Dizziness or feeling light-headed. ? Increased heart rate or palpitations. ? Blurry vision. ? Tingling or numbness around the mouth, lips, or tongue. ? Tremors or shakes. ? Irritability.  Keep a rapid-acting carbohydrate snack available before, during, and after exercise to help prevent or treat hypoglycemia.  Avoid injecting insulin into areas of the body that are going to be exercised. For example, avoid injecting insulin into: ? The arms, when playing tennis. ? The legs, when jogging.  Keep records of your exercise habits. Doing this can help you and your health care provider adjust your diabetes management plan as needed. Write down: ? Food that you eat before and after you exercise. ? Blood glucose levels before and after you exercise. ? The type and amount of exercise you have done. ? When your insulin is expected to peak, if you use insulin. Avoid exercising at times when your insulin is peaking.  When you start a new exercise or activity, work with your health care provider to make sure the activity is safe for you, and to adjust your insulin, medicines, or food intake as needed.  Drink plenty of water while you exercise to prevent dehydration or heat stroke. Drink enough fluid to keep your urine clear or  pale yellow. This information is not intended to replace advice given to you by your health care provider. Make sure you discuss any questions you have with your health care provider. Document Released: 12/18/2003 Document Revised: 04/16/2016 Document Reviewed: 03/08/2016 Elsevier Interactive Patient Education  2018 [employer]. Diabetes Mellitus and Food It is important for you to manage your blood sugar (glucose) level. Your blood glucose level can be greatly affected by what you eat. Eating healthier foods in the appropriate amounts throughout the day at about the same time each day will help you control your blood glucose level. It can also help slow or prevent worsening of your diabetes mellitus. Healthy  eating may even help you improve the level of your blood pressure and reach or maintain a healthy weight. General recommendations for healthful eating and cooking habits include:  Eating meals and snacks regularly. Avoid going long periods of time without eating to lose weight.  Eating a diet that consists mainly of plant-based foods, such as fruits, vegetables, nuts, legumes, and whole grains.  Using low-heat cooking methods, such as baking, instead of high-heat cooking methods, such as deep frying.  Work with your dietitian to make sure you understand how to use the Nutrition Facts information on food labels. How can food affect me? Carbohydrates Carbohydrates affect your blood glucose level more than any other type of food. Your dietitian will help you determine how many carbohydrates to eat at each meal and teach you how to count carbohydrates. Counting carbohydrates is important to keep your blood glucose at a healthy level, especially if you are using insulin or taking certain medicines for diabetes mellitus. Alcohol Alcohol can cause sudden decreases in blood glucose (hypoglycemia), especially if you use insulin or take certain medicines for diabetes mellitus. Hypoglycemia can be a  life-threatening condition. Symptoms of hypoglycemia (sleepiness, dizziness, and disorientation) are similar to symptoms of having too much alcohol. If your health care provider has given you approval to drink alcohol, do so in moderation and use the following guidelines:  Women should not have more than one drink per day, and men should not have more than two drinks per day. One drink is equal to: ? 12 oz of beer. ? 5 oz of wine. ? 1 oz of hard liquor.  Do not drink on an empty stomach.  Keep yourself hydrated. Have water, diet soda, or unsweetened iced tea.  Regular soda, juice, and other mixers might contain a lot of carbohydrates and should be counted.  What foods are not recommended? As you make food choices, it is important to remember that all foods are not the same. Some foods have fewer nutrients per serving than other foods, even though they might have the same number of calories or carbohydrates. It is difficult to get your body what it needs when you eat foods with fewer nutrients. Examples of foods that you should avoid that are high in calories and carbohydrates but low in nutrients include:  Trans fats (most processed foods list trans fats on the Nutrition Facts label).  Regular soda.  Juice.  Candy.  Sweets, such as cake, pie, doughnuts, and cookies.  Fried foods.  What foods can I eat? Eat nutrient-rich foods, which will nourish your body and keep you healthy. The food you should eat also will depend on several factors, including:  The calories you need.  The medicines you take.  Your weight.  Your blood glucose level.  Your blood pressure level.  Your cholesterol level.  You should eat a variety of foods, including:  Protein. ? Lean cuts of meat. ? Proteins low in saturated fats, such as fish, egg whites, and beans. Avoid processed meats.  Fruits and vegetables. ? Fruits and vegetables that may help control blood glucose levels, such as apples,  mangoes, and yams.  Dairy products. ? Choose fat-free or low-fat dairy products, such as milk, yogurt, and cheese.  Grains, bread, pasta, and rice. ? Choose whole grain products, such as multigrain bread, whole oats, and brown rice. These foods may help control blood pressure.  Fats. ? Foods containing healthful fats, such as nuts, avocado, olive oil, canola oil, and fish.  Does  everyone with diabetes mellitus have the same meal plan? Because every person with diabetes mellitus is different, there is not one meal plan that works for everyone. It is very important that you meet with a dietitian who will help you create a meal plan that is just right for you. This information is not intended to replace advice given to you by your health care provider. Make sure you discuss any questions you have with your health care provider. Document Released: 06/24/2005 Document Revised: 03/04/2016 Document Reviewed: 08/24/2013 Elsevier Interactive Patient Education  2017 [employer].

## 2017-05-10 NOTE — Progress Notes (Signed)
Patient ID: [name], female    DOB: 1981-06-04, 36 y.o.   MRN: [id number]  PCP: Scot Jun, FNP  Chief Complaint  Patient presents with  . Hospitalization Follow-up  . Establish Care    Subjective:  HPI Chenell A Haidee Stogsdill is a 36 y.o. female presents to establish care and hospital follow-up.  Medical problems include: Bipolar disorder, Type 2 Diabetes (new diagnosis), and Obesity. Cadance was recently evaluated at [facility] for a complaint of hyperglycemia.  Her blood sugar on admission was 397. Addylynn reports that she presented to the emergency room as she experienced several weeks nausea, dizziness, fatigue, and increased thirst. Her hemoglobin A1c 7.9, 3 months prior. She was discharged and prescribed metformin 500 mg twice daily. She reports that she was unaware of prior diagnosis of diabetes. [name] is a current everyday smoker and leads a very sedentary lifestyle. Denies neuropathic pain or visual disturbances.  Social History   Social History  . Marital status: Married    Spouse name: N/A  . Number of children: N/A  . Years of education: N/A   Occupational History  . Not on file.   Social History Main Topics  . Smoking status: Current Every Day Smoker    Packs/day: 0.50    Years: 23.00    Types: Cigarettes  . Smokeless tobacco: Never Used  . Alcohol use Yes     Comment: unsure amount (1/6) - states generally does not drink  . Drug use: No  . Sexual activity: Yes    Birth control/ protection: None   Other Topics Concern  . Not on file   Social History Narrative  . No narrative on file    Family History  Problem Relation Age of Onset  . Diabetes insipidus Father   . Arthritis Father   . Heart failure Father   . COPD Father   . Emphysema Father   . Asthma Son    Review of Systems See HPI  Patient Active Problem List   Diagnosis Date Noted  . Overdose of antipsychotic 01/23/2017  . Hypoxia 01/17/2017   . Bipolar I disorder, most recent episode depressed ([place]) 06/11/2013  . Depression with suicidal ideation 06/09/2013  . Alcohol abuse, episodic drinking behavior 06/09/2013  . Major depressive disorder, recurrent episode, severe, specified as with psychotic behavior 06/09/2013  . 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 SNORING 07/18/2009  . OVARIAN CYST 06/13/2009  . HYPERCHOLESTEROLEMIA 09/19/2008  . BACK PAIN 09/18/2008  . TOBACCO ABUSE 08/13/2008  . INSOMNIA 08/13/2008    Allergies  Allergen Reactions  . Orange Oil Shortness Of Breath  . Carisoprodol     Skin peeled   . Piroxicam     Skin peeled    Prior to Admission medications   Medication Sig Start Date End Date Taking? Authorizing Provider  acetaminophen (TYLENOL) 325 MG tablet Take 2 tablets (650 mg total) by mouth every 6 (six) hours as needed for mild pain or moderate pain. 05/06/17  Yes Waynetta Pean, PA-C  albuterol (PROVENTIL HFA;VENTOLIN HFA) 108 (90 Base) MCG/ACT inhaler Inhale 1-2 puffs into the lungs every 6 (six) hours as needed for wheezing or shortness of breath. 01/18/17  Yes Hoffman, Jessica Ratliff, DO  ARIPiprazole (ABILIFY) 5 MG tablet Take 1 tablet (5 mg total) by mouth daily. For major depressive disorder. 06/13/13  Yes Mashburn, Marlane Hatcher, PA-C  Brexpiprazole (REXULTI PO) Take 0.5 tablets by mouth daily. Dose unknown. (  pt gets at [employer])    Yes [provider]  gabapentin (NEURONTIN) 300 MG capsule Take 1-2 capsules (300-600 mg total) by mouth 3 (three) times daily. Only take as prescribed.  Husband is to administer medication Patient taking differently: Take 300-600 mg by mouth 2 (two) times daily. Only take as prescribed.  Husband is to administer medication 01/24/17  Yes Hoffman, Jessica Ratliff, DO  lurasidone (LATUDA) 80 MG TABS tablet Take 1 tablet (80 mg total) by mouth daily with breakfast. Only take as prescribed.  Husband is to administer  medication. 01/24/17  Yes Kalman Shan Ratliff, DO  metFORMIN (GLUCOPHAGE) 500 MG tablet Take 1 tablet (500 mg total) by mouth 2 (two) times daily with a meal. 05/06/17  Yes Waynetta Pean, PA-C  sertraline (ZOLOFT) 100 MG tablet Take 2 tablets (200 mg total) by mouth daily. For depression and anxiety. 06/13/13  Yes Mashburn, Marlane Hatcher, PA-C  traZODone (DESYREL) 100 MG tablet Take 200 mg by mouth at bedtime as needed for sleep. For insomnia.  06/13/13  Yes Mashburn, Marlane Hatcher, PA-C    Past Medical, Surgical Family and Social History reviewed and updated.    Objective:   Today's Vitals   05/10/17 0903  BP: 130/84  Pulse: 96  Resp: 16  Temp: 98.6 F (37 C)  TempSrc: Oral  SpO2: 100%  Weight: 218 lb 12.8 oz (99.2 kg)  Height: _0  (1.676 m)    Wt Readings from Last 3 Encounters:  05/10/17 218 lb 12.8 oz (99.2 kg)  01/23/17 221 lb 8 oz (100.5 kg)  01/17/17 215 lb (97.5 kg)    Physical Exam  Constitutional: She is oriented to person, place, and time. She appears well-developed and well-nourished.  HENT:  Head: Normocephalic and atraumatic.  Eyes: Pupils are equal, round, and reactive to light. Conjunctivae and EOM are normal.  Neck: Normal range of motion. Neck supple.  Cardiovascular: Normal rate, regular rhythm, normal heart sounds and intact distal pulses.   Pulmonary/Chest: Effort normal and breath sounds normal.  Abdominal: Soft. Bowel sounds are normal.  Musculoskeletal: Normal range of motion.  Lymphadenopathy:    She has no cervical adenopathy.  Neurological: She is alert and oriented to person, place, and time.  Skin: Skin is warm and dry.  Psychiatric: She has a normal mood and affect. Her behavior is normal. Judgment and thought content normal.   Assessment & Plan:  1. Type 2 diabetes mellitus without complication, without long-term current use of insulin ([place]), uncontrolled, A1C today 9.1. 2. Hypercholesteremia  Diagnostic Tests Ordered This Encounter: - HM  Diabetes Foot Exam - CBC with Differential - COMPLETE METABOLIC PANEL WITH GFR - Thyroid Panel With TSH - Microalbumin/Creatinine Ratio, Urine - Lipid panel;   Meds ordered this encounter  Medications  . metFORMIN (GLUCOPHAGE) 1000 MG tablet    Sig: Take 1 tablet (1,000 mg total) by mouth 2 (two) times daily with a meal.    Dispense:  180 tablet    Refill:  1    Order Specific Question:   Supervising Provider    Answer:   Tresa Garter [id number]  . Insulin Glargine (LANTUS SOLOSTAR) 100 UNIT/ML Solostar Pen    Sig: Inject 10 Units into the skin daily at 10 pm.    Dispense:  5 pen    Refill:  2    Order Specific Question:   Supervising Provider    Answer:   Tresa Garter [id number]  . Insulin Pen Needle (NOVOFINE) 30G X 8  MM MISC    Sig: Inject 10 each into the skin as needed.    Dispense:  100 each    Refill:  2    Order Specific Question:   Supervising Provider    Answer:   Tresa Garter [id number]  . blood glucose meter kit and supplies KIT    Sig: Dispense based on patient and insurance preference. Use up to four times daily as directed. (FOR ICD-9 250.00, 250.01).    Dispense:  1 each    Refill:  0    Order Specific Question:   Supervising Provider    Answer:   Tresa Garter [id number]    Order Specific Question:   Number of strips    Answer:   150    Order Specific Question:   Number of lancets    Answer:   150  . aspirin EC 81 MG tablet    Sig: Take 1 tablet (81 mg total) by mouth daily.    Dispense:  90 tablet    Refill:  3    Order Specific Question:   Supervising Provider    Answer:   Tresa Garter [id number]  . lisinopril (PRINIVIL,ZESTRIL) 2.5 MG tablet    Sig: Take 1 tablet (2.5 mg total) by mouth daily.    Dispense:  90 tablet    Refill:  2    Order Specific Question:   Supervising Provider    Answer:   Tresa Garter [id number]  . pravastatin (PRAVACHOL) 80 MG tablet    Sig: Take 1 tablet (80 mg total) by mouth daily.     Dispense:  90 tablet    Refill:  3    Order Specific Question:   Supervising Provider    Answer:   Tresa Garter [[id number]]     RTC: 1 month diabetes follow-up and medication management   Carroll Sage. Kenton Kingfisher, MSN, FNP-C [facility]  [address] [place], [place] [address] [phone]

## 2017-05-11 LAB — THYROID PANEL WITH TSH
Free Thyroxine Index: 2.8 (ref 1.4–3.8)
T3 UPTAKE: 27 % (ref 22–35)
T4 TOTAL: 10.5 ug/dL (ref 4.5–12.0)
TSH: 1.06 m[IU]/L

## 2017-05-11 LAB — COMPLETE METABOLIC PANEL WITH GFR
ALBUMIN: 4.5 g/dL (ref 3.6–5.1)
ALK PHOS: 78 U/L (ref 33–115)
ALT: 21 U/L (ref 6–29)
AST: 17 U/L (ref 10–30)
BILIRUBIN TOTAL: 0.4 mg/dL (ref 0.2–1.2)
BUN: 5 mg/dL — AB (ref 7–25)
CO2: 24 mmol/L (ref 20–31)
Calcium: 9.8 mg/dL (ref 8.6–10.2)
Chloride: 98 mmol/L (ref 98–110)
Creat: 0.75 mg/dL (ref 0.50–1.10)
GFR, Est African American: >89 mL/min (ref >=60)
GLUCOSE: 193 mg/dL — AB (ref 65–99)
Potassium: 4.6 mmol/L (ref 3.5–5.3)
SODIUM: 133 mmol/L — AB (ref 135–146)
TOTAL PROTEIN: 6.9 g/dL (ref 6.1–8.1)

## 2017-05-11 LAB — LIPID PANEL
Cholesterol: 337 mg/dL — ABNORMAL HIGH (ref <200)
HDL: 39 mg/dL — ABNORMAL LOW (ref >50)
Total CHOL/HDL Ratio: 8.6 Ratio — ABNORMAL HIGH (ref <5.0)
Triglycerides: 501 mg/dL — ABNORMAL HIGH (ref <150)

## 2017-05-11 LAB — MICROALBUMIN / CREATININE URINE RATIO
CREATININE, URINE: 49 mg/dL (ref 20–320)
Microalb Creat Ratio: 18 mcg/mg creat (ref <30)
Microalb, Ur: 0.9 mg/dL

## 2017-05-11 MED ORDER — PRAVASTATIN SODIUM 80 MG PO TABS: 80.0000 mg | ORAL_TABLET | Freq: Every day | ORAL | 3 refills | Status: AC

## 2017-05-11 MED FILL — PRAVASTATIN NA 80 MG TAB: 80 | 30 days supply | Qty: 30 | Fill #0 | Status: TO

## 2017-05-15 LAB — POCT GLYCOSYLATED HEMOGLOBIN (HGB A1C): Hemoglobin A1C: 9.1

## 2017-05-20 DIAGNOSIS — F17210 Nicotine dependence, cigarettes, uncomplicated: Secondary | ICD-10-CM | POA: Insufficient documentation

## 2017-05-20 DIAGNOSIS — E1165 Type 2 diabetes mellitus with hyperglycemia: Secondary | ICD-10-CM | POA: Insufficient documentation

## 2017-05-20 DIAGNOSIS — Z79899 Other long term (current) drug therapy: Secondary | ICD-10-CM | POA: Insufficient documentation

## 2017-05-20 DIAGNOSIS — Z7982 Long term (current) use of aspirin: Secondary | ICD-10-CM | POA: Insufficient documentation

## 2017-05-21 ENCOUNTER — Emergency Department (HOSPITAL_COMMUNITY)
Admission: EM | Admit: 2017-05-21 | Discharge: 2017-05-21 | Disposition: A | Discharge status: Home / Self Care | Payer: Medicaid Other | Attending: Emergency Medicine | Admitting: Emergency Medicine

## 2017-05-21 ENCOUNTER — Encounter (HOSPITAL_COMMUNITY): Payer: Self-pay | Admitting: Emergency Medicine

## 2017-05-21 DIAGNOSIS — R739 Hyperglycemia, unspecified: Secondary | ICD-10-CM

## 2017-05-21 LAB — URINALYSIS, ROUTINE W REFLEX MICROSCOPIC
BACTERIA UA: NONE SEEN
Bilirubin Urine: NEGATIVE
Hgb urine dipstick: NEGATIVE
KETONES UR: NEGATIVE mg/dL
LEUKOCYTES UA: NEGATIVE
Nitrite: NEGATIVE
PROTEIN: NEGATIVE mg/dL
SQUAMOUS EPITHELIAL / LPF: NONE SEEN
Specific Gravity, Urine: 1.005 (ref 1.005–1.030)
WBC, UA: NONE SEEN WBC/hpf (ref 0–5)
pH: 6 (ref 5.0–8.0)

## 2017-05-21 LAB — CBC
HEMATOCRIT: 41.4 % (ref 36.0–46.0)
Hemoglobin: 14.7 g/dL (ref 12.0–15.0)
MCH: 31.2 pg (ref 26.0–34.0)
MCHC: 35.5 g/dL (ref 30.0–36.0)
MCV: 87.9 fL (ref 78.0–100.0)
PLATELETS: 419 10*3/uL — AB (ref 150–400)
RBC: 4.71 MIL/uL (ref 3.87–5.11)
RDW: 12.8 % (ref 11.5–15.5)
WBC: 14 10*3/uL — AB (ref 4.0–10.5)

## 2017-05-21 LAB — BASIC METABOLIC PANEL
Anion gap: 14 (ref 5–15)
CHLORIDE: 102 mmol/L (ref 101–111)
CO2: 21 mmol/L — ABNORMAL LOW (ref 22–32)
CREATININE: 0.77 mg/dL (ref 0.44–1.00)
Calcium: 9.4 mg/dL (ref 8.9–10.3)
GFR calc non Af Amer: >60 mL/min (ref >60)
Glucose, Bld: 263 mg/dL — ABNORMAL HIGH (ref 65–99)
POTASSIUM: 3.4 mmol/L — AB (ref 3.5–5.1)
SODIUM: 137 mmol/L (ref 135–145)

## 2017-05-21 LAB — CBG MONITORING, ED
GLUCOSE-CAPILLARY: 204 mg/dL — AB (ref 65–99)
GLUCOSE-CAPILLARY: 169 mg/dL — AB (ref 65–99)
Glucose-Capillary: 278 mg/dL — ABNORMAL HIGH (ref 65–99)

## 2017-05-21 NOTE — ED Notes (Signed)
ED Provider at bedside. 

## 2017-05-21 NOTE — ED Triage Notes (Signed)
Pt reports sugar reading in 350's at home after taking insulin. CBG 278 upon arrival. Pt reports excessive sweating and chronic back pain.

## 2017-05-21 NOTE — ED Notes (Signed)
Pt departed in NAD, refused use of wheelchair.  

## 2017-05-21 NOTE — ED Provider Notes (Signed)
[facility] DEPT Provider Note   CSN: [id number] Arrival date & time: 05/20/17  2321     History   Chief Complaint Chief Complaint  Patient presents with  . Hyperglycemia    HPI [name] is a 36 y.o. female.  The history is provided by the patient.  Hyperglycemia  Blood sugar level PTA:  285 Severity:  Moderate Onset quality:  Gradual Timing:  Constant Progression:  Worsening Chronicity:  New Diabetes status:  Controlled with insulin and controlled with oral medications Context: new diabetes diagnosis   Relieved by:  Nothing Ineffective treatments:  None tried Associated symptoms: no abdominal pain   Risk factors: obesity   Risk factors: no family hx of diabetes     Past Medical History:  Diagnosis Date  . Anxiety   . Back pain   . CAP (community acquired pneumonia)   . Chronic pain syndrome   . Depression   . Gallbladder problem   . Obsessive compulsive disorder     Patient Active Problem List   Diagnosis Date Noted  . Overdose of antipsychotic 01/23/2017  . Hypoxia 01/17/2017  . Bipolar I disorder, most recent episode depressed ([place]) 06/11/2013  . Depression with suicidal ideation 06/09/2013  . Alcohol abuse, episodic drinking behavior 06/09/2013  . Major depressive disorder, recurrent episode, severe, specified as with psychotic behavior 06/09/2013  . 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 SNORING 07/18/2009  . OVARIAN CYST 06/13/2009  . HYPERCHOLESTEROLEMIA 09/19/2008  . BACK PAIN 09/18/2008  . TOBACCO ABUSE 08/13/2008  . INSOMNIA 08/13/2008    Past Surgical History:  Procedure Laterality Date  . CESAREAN SECTION    . CHOLECYSTECTOMY  2009  . wisdom teeth extraction      OB History    Gravida Para Term Preterm AB Living   '1 1 1     1   '$ SAB TAB Ectopic Multiple Live Births           1       Home Medications    Prior to Admission medications   Medication Sig Start  Date End Date Taking? Authorizing Provider  acetaminophen (TYLENOL) 325 MG tablet Take 2 tablets (650 mg total) by mouth every 6 (six) hours as needed for mild pain or moderate pain. 05/06/17  Yes Waynetta Pean, PA-C  albuterol (PROVENTIL HFA;VENTOLIN HFA) 108 (90 Base) MCG/ACT inhaler Inhale 1-2 puffs into the lungs every 6 (six) hours as needed for wheezing or shortness of breath. 01/18/17  Yes Hoffman, Jessica Ratliff, DO  ARIPiprazole (ABILIFY) 5 MG tablet Take 1 tablet (5 mg total) by mouth daily. For major depressive disorder. 06/13/13  Yes Mashburn, Marlane Hatcher, PA-C  aspirin EC 81 MG tablet Take 1 tablet (81 mg total) by mouth daily. 05/10/17  Yes Scot Jun, FNP  Brexpiprazole 0.5 MG TABS Take 0.5 mg by mouth daily.   Yes [provider]  gabapentin (NEURONTIN) 300 MG capsule Take 1-2 capsules (300-600 mg total) by mouth 3 (three) times daily. Only take as prescribed.  Husband is to administer medication Patient taking differently: Take 300-600 mg by mouth 2 (two) times daily.  01/24/17  Yes Hoffman, Jessica Ratliff, DO  Insulin Glargine (LANTUS SOLOSTAR) 100 UNIT/ML Solostar Pen Inject 10 Units into the skin daily at 10 pm. 05/10/17  Yes Scot Jun, FNP  lisinopril (PRINIVIL,ZESTRIL) 2.5 MG tablet Take 1 tablet (2.5 mg total) by mouth daily. 05/10/17  Yes Scot Jun, FNP  lurasidone (LATUDA)  80 MG TABS tablet Take 1 tablet (80 mg total) by mouth daily with breakfast. Only take as prescribed.  Husband is to administer medication. 01/24/17  Yes Kalman Shan Ratliff, DO  metFORMIN (GLUCOPHAGE) 1000 MG tablet Take 1 tablet (1,000 mg total) by mouth 2 (two) times daily with a meal. 05/10/17 11/06/17 Yes Scot Jun, FNP  pravastatin (PRAVACHOL) 80 MG tablet Take 1 tablet (80 mg total) by mouth daily. 05/11/17  Yes Scot Jun, FNP  sertraline (ZOLOFT) 100 MG tablet Take 2 tablets (200 mg total) by mouth daily. For depression and anxiety. 06/13/13  Yes Mashburn, Marlane Hatcher, PA-C  traZODone (DESYREL) 100 MG tablet Take 200 mg by mouth at bedtime as needed for sleep. For insomnia.  06/13/13  Yes Mashburn, Milta Deiters T, PA-C  blood glucose meter kit and supplies KIT Dispense based on patient and insurance preference. Use up to four times daily as directed. (FOR ICD-9 250.00, 250.01). 05/10/17   Scot Jun, FNP  Brexpiprazole (REXULTI PO) Take 0.5 tablets by mouth daily. Dose unknown. ( pt gets at [facility])     [provider]  Insulin Pen Needle (NOVOFINE) 30G X 8 MM MISC Inject 10 each into the skin as needed. 05/10/17   Scot Jun, FNP    Family History Family History  Problem Relation Age of Onset  . Diabetes insipidus Father   . Arthritis Father   . Heart failure Father   . COPD Father   . Emphysema Father   . Asthma Son     Social History Social History  Substance Use Topics  . Smoking status: Current Every Day Smoker    Packs/day: 0.50    Years: 23.00    Types: Cigarettes  . Smokeless tobacco: Never Used  . Alcohol use Yes     Comment: unsure amount (1/6) - states generally does not drink     Allergies   Orange oil; Carisoprodol; and Piroxicam   Review of Systems Review of Systems  Gastrointestinal: Negative for abdominal pain.  Genitourinary: Negative for difficulty urinating.  All other systems reviewed and are negative.    Physical Exam Updated Vital Signs BP 124/87   Pulse 98   Temp 98.3 F (36.8 C) (Oral)   Resp 14   Ht '5\' 6"'$  (1.676 m)   Wt 97.1 kg (214 lb)   LMP 04/26/2017   SpO2 96%   BMI 34.54 kg/m   Physical Exam  Constitutional: She is oriented to person, place, and time. She appears well-developed and well-nourished.  HENT:  Head: Normocephalic and atraumatic.  Nose: Nose normal.  Mouth/Throat: No oropharyngeal exudate.  Eyes: Pupils are equal, round, and reactive to light. EOM are normal.  Neck: Normal range of motion. Neck supple.  Cardiovascular: Normal rate, regular rhythm, normal heart  sounds and intact distal pulses.   Pulmonary/Chest: Effort normal and breath sounds normal. She has no wheezes. She has no rales.  Abdominal: Soft. Bowel sounds are normal. She exhibits no mass. There is no tenderness. There is no rebound and no guarding.  Musculoskeletal: Normal range of motion.  Neurological: She is alert and oriented to person, place, and time. She displays normal reflexes.  Skin: Skin is warm and dry. Capillary refill takes less than 2 seconds.  Psychiatric: She has a normal mood and affect.  Nursing note and vitals reviewed.    ED Treatments / Results   Vitals:   05/21/17 0530 05/21/17 0545  BP: 107/78 124/87  Pulse: 95 98  Resp:    Temp:    SpO2: 94% 96%    Labs (all labs ordered are listed, but only abnormal results are displayed) Results for orders placed or performed during the hospital encounter of 63/78/58  Basic metabolic panel  Result Value Ref Range   Sodium 137 135 - 145 mmol/L   Potassium 3.4 (L) 3.5 - 5.1 mmol/L   Chloride 102 101 - 111 mmol/L   CO2 21 (L) 22 - 32 mmol/L   Glucose, Bld 263 (H) 65 - 99 mg/dL   BUN <5 (L) 6 - 20 mg/dL   Creatinine, Ser 0.77 0.44 - 1.00 mg/dL   Calcium 9.4 8.9 - 10.3 mg/dL   GFR calc non Af Amer >60 >60 mL/min   GFR calc Af Amer >60 >60 mL/min   Anion gap 14 5 - 15  CBC  Result Value Ref Range   WBC 14.0 (H) 4.0 - 10.5 K/uL   RBC 4.71 3.87 - 5.11 MIL/uL   Hemoglobin 14.7 12.0 - 15.0 g/dL   HCT 41.4 36.0 - 46.0 %   MCV 87.9 78.0 - 100.0 fL   MCH 31.2 26.0 - 34.0 pg   MCHC 35.5 30.0 - 36.0 g/dL   RDW 12.8 11.5 - 15.5 %   Platelets 419 (H) 150 - 400 K/uL  Urinalysis, Routine w reflex microscopic  Result Value Ref Range   Color, Urine STRAW (A) YELLOW   APPearance CLEAR CLEAR   Specific Gravity, Urine 1.005 1.005 - 1.030   pH 6.0 5.0 - 8.0   Glucose, UA >=500 (A) NEGATIVE mg/dL   Hgb urine dipstick NEGATIVE NEGATIVE   Bilirubin Urine NEGATIVE NEGATIVE   Ketones, ur NEGATIVE NEGATIVE mg/dL    Protein, ur NEGATIVE NEGATIVE mg/dL   Nitrite NEGATIVE NEGATIVE   Leukocytes, UA NEGATIVE NEGATIVE   RBC / HPF 0-5 0 - 5 RBC/hpf   WBC, UA NONE SEEN 0 - 5 WBC/hpf   Bacteria, UA NONE SEEN NONE SEEN   Squamous Epithelial / LPF NONE SEEN NONE SEEN  CBG monitoring, ED  Result Value Ref Range   Glucose-Capillary 278 (H) 65 - 99 mg/dL  CBG monitoring, ED  Result Value Ref Range   Glucose-Capillary 204 (H) 65 - 99 mg/dL  CBG monitoring, ED  Result Value Ref Range   Glucose-Capillary 169 (H) 65 - 99 mg/dL   No results found.   Procedures Procedures (including critical care time)   Final Clinical Impressions(s) / ED Diagnoses   Final diagnoses:  Hyperglycemia  Count carbs and no alcohol.  Follow up with your PMD.    The patient is very well appearing and has been observed in the ED.  Strict return precautions given for:    intractable abdominal pain, hard or rigid abdomen, inability to pass gas or stool, intractable diarrhea.  inability to tolerate oral liquids or foods, shortness of breath, changes in vision or thinking, chest pain, dyspnea on exertion, weakness or numbness or any concerns. No signs of systemic illness or infection. The patient is nontoxic-appearing on exam and vital signs are within normal limits. He is advised to take tylenol for pain and drink copious liquids and follow up with his PMD in 2 days.  I have reviewed the triage vital signs and the nursing notes. Pertinent labs &imaging results that were available during my care of the patient were reviewed by me and considered in my medical decision making (see chart for details).  After history, exam, and medical workup I feel the  patient has been appropriately medically screened and is safe for discharge home. Pertinent diagnoses were discussed with the patient. Patient was given return precautions.     Vahan Wadsworth, MD 05/21/17 [phone]

## 2017-06-10 ENCOUNTER — Ambulatory Visit (INDEPENDENT_AMBULATORY_CARE_PROVIDER_SITE_OTHER): Payer: Self-pay | Admitting: Family Medicine

## 2017-06-10 ENCOUNTER — Encounter: Payer: Self-pay | Admitting: Family Medicine

## 2017-06-10 VITALS — BP 126/89 | HR 90 | Temp 98.7°F | Resp 14 | Ht 66.0 in | Wt 214.2 lb

## 2017-06-10 DIAGNOSIS — E119 Type 2 diabetes mellitus without complications: Secondary | ICD-10-CM

## 2017-06-10 DIAGNOSIS — Z794 Long term (current) use of insulin: Secondary | ICD-10-CM

## 2017-06-10 DIAGNOSIS — Z23 Encounter for immunization: Secondary | ICD-10-CM

## 2017-06-10 LAB — POCT URINALYSIS DIP (DEVICE)
BILIRUBIN URINE: NEGATIVE
Glucose, UA: NEGATIVE mg/dL
HGB URINE DIPSTICK: NEGATIVE
Leukocytes, UA: NEGATIVE
Nitrite: NEGATIVE
PH: 6 (ref 5.0–8.0)
PROTEIN: NEGATIVE mg/dL
SPECIFIC GRAVITY, URINE: 1.02 (ref 1.005–1.030)
Urobilinogen, UA: 0.2 mg/dL (ref 0.0–1.0)

## 2017-06-10 LAB — GLUCOSE, CAPILLARY: GLUCOSE-CAPILLARY: 129 mg/dL — AB (ref 65–99)

## 2017-06-10 MED FILL — metFORMIN HCL 1000 MG TABS: 1000 | 90 days supply | Qty: 180 | Fill #1 | Status: TO

## 2017-06-10 MED FILL — LANTUS SOLOSTAR 100 UNITS/M: 100 | 28 days supply | Qty: 3 | Fill #1 | Status: TO

## 2017-06-10 MED FILL — LISINOPRIL 2.5 MG TABLET: 2.5 | 90 days supply | Qty: 90 | Fill #1 | Status: TO

## 2017-06-10 NOTE — Progress Notes (Signed)
Patient ID: [name], female    DOB: 12-06-1980, 36 y.o.   MRN: [id number]  PCP: Scot Jun, FNP  Chief Complaint  Patient presents with  . Follow-up    1 MONTH    Subjective:  HPI [name] is a 36 y.o. female with diabetes, uncontrolled  presents for a 1 month follow-up evaluation of diabetes. Most recent hemoglobin A1C 9.1 and she was recently prescribed Lantus in addition to metformin in order to achieve better glycemic control. She reports routinely monitoring her blood sugar at home and the highest reading has been 165. She denies hyperglycemia. [name] reports occasional hand neuropathy, although denies foot pain, headaches, chest pain, shortness of breath or urinary frequency. Social History   Social History  . Marital status: Married    Spouse name: N/A  . Number of children: N/A  . Years of education: N/A   Occupational History  . Not on file.   Social History Main Topics  . Smoking status: Current Every Day Smoker    Packs/day: 0.50    Years: 23.00    Types: Cigarettes  . Smokeless tobacco: Never Used  . Alcohol use Yes     Comment: unsure amount (1/6) - states generally does not drink  . Drug use: No  . Sexual activity: Yes    Birth control/ protection: None   Other Topics Concern  . Not on file   Social History Narrative  . No narrative on file    Family History  Problem Relation Age of Onset  . Diabetes insipidus Father   . Arthritis Father   . Heart failure Father   . COPD Father   . Emphysema Father   . Asthma Son    Review of Systems See HPI  Patient Active Problem List   Diagnosis Date Noted  . Overdose of antipsychotic 01/23/2017  . Hypoxia 01/17/2017  . Bipolar I disorder, most recent episode depressed ([place]) 06/11/2013  . Depression with suicidal ideation 06/09/2013  . Alcohol abuse, episodic drinking behavior 06/09/2013  . Major depressive disorder, recurrent episode, severe, specified as with  psychotic behavior 06/09/2013  . 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 SNORING 07/18/2009  . OVARIAN CYST 06/13/2009  . HYPERCHOLESTEROLEMIA 09/19/2008  . BACK PAIN 09/18/2008  . TOBACCO ABUSE 08/13/2008  . INSOMNIA 08/13/2008    Allergies  Allergen Reactions  . Orange Oil Shortness Of Breath  . Carisoprodol     Skin peeled   . Piroxicam     Skin peeled    Prior to Admission medications   Medication Sig Start Date End Date Taking? Authorizing Provider  acetaminophen (TYLENOL) 325 MG tablet Take 2 tablets (650 mg total) by mouth every 6 (six) hours as needed for mild pain or moderate pain. 05/06/17  Yes Waynetta Pean, PA-C  albuterol (PROVENTIL HFA;VENTOLIN HFA) 108 (90 Base) MCG/ACT inhaler Inhale 1-2 puffs into the lungs every 6 (six) hours as needed for wheezing or shortness of breath. 01/18/17  Yes Hoffman, Jessica Ratliff, DO  ARIPiprazole (ABILIFY) 5 MG tablet Take 1 tablet (5 mg total) by mouth daily. For major depressive disorder. 06/13/13  Yes Mashburn, Marlane Hatcher, PA-C  aspirin EC 81 MG tablet Take 1 tablet (81 mg total) by mouth daily. 05/10/17  Yes Scot Jun, FNP  blood glucose meter kit and supplies KIT Dispense based on patient and insurance preference. Use up to four times daily as directed. (FOR ICD-9 250.00, 250.01). 05/10/17  Yes Scot Jun, FNP  Brexpiprazole (REXULTI PO) Take 0.5 tablets by mouth daily. Dose unknown. ( pt gets at [facility])    Yes [provider]  Brexpiprazole 0.5 MG TABS Take 0.5 mg by mouth daily.   Yes [provider]  gabapentin (NEURONTIN) 300 MG capsule Take 1-2 capsules (300-600 mg total) by mouth 3 (three) times daily. Only take as prescribed.  Husband is to administer medication Patient taking differently: Take 300-600 mg by mouth 2 (two) times daily.  01/24/17  Yes Hoffman, Jessica Ratliff, DO  Insulin Glargine (LANTUS SOLOSTAR) 100 UNIT/ML Solostar Pen Inject 10  Units into the skin daily at 10 pm. 05/10/17  Yes Scot Jun, FNP  Insulin Pen Needle (NOVOFINE) 30G X 8 MM MISC Inject 10 each into the skin as needed. 05/10/17  Yes Scot Jun, FNP  lisinopril (PRINIVIL,ZESTRIL) 2.5 MG tablet Take 1 tablet (2.5 mg total) by mouth daily. 05/10/17  Yes Scot Jun, FNP  lurasidone (LATUDA) 80 MG TABS tablet Take 1 tablet (80 mg total) by mouth daily with breakfast. Only take as prescribed.  Husband is to administer medication. 01/24/17  Yes Kalman Shan Ratliff, DO  metFORMIN (GLUCOPHAGE) 1000 MG tablet Take 1 tablet (1,000 mg total) by mouth 2 (two) times daily with a meal. 05/10/17 11/06/17 Yes Scot Jun, FNP  pravastatin (PRAVACHOL) 80 MG tablet Take 1 tablet (80 mg total) by mouth daily. 05/11/17  Yes Scot Jun, FNP  sertraline (ZOLOFT) 100 MG tablet Take 2 tablets (200 mg total) by mouth daily. For depression and anxiety. 06/13/13  Yes Mashburn, Marlane Hatcher, PA-C  traZODone (DESYREL) 100 MG tablet Take 200 mg by mouth at bedtime as needed for sleep. For insomnia.  06/13/13  Yes Mashburn, Marlane Hatcher, PA-C    Past Medical, Surgical Family and Social History reviewed and updated.    Objective:   Today's Vitals   06/10/17 1026  BP: 126/89  Pulse: 90  Resp: 14  Temp: 98.7 F (37.1 C)  TempSrc: Oral  SpO2: 99%  Weight: 214 lb 3.2 oz (97.2 kg)  Height: 5' 6" (1.676 m)    Wt Readings from Last 3 Encounters:  06/10/17 214 lb 3.2 oz (97.2 kg)  05/20/17 214 lb (97.1 kg)  05/10/17 218 lb 12.8 oz (99.2 kg)   Physical Exam  Constitutional: She is oriented to person, place, and time. She appears well-developed and well-nourished.  HENT:  Head: Normocephalic and atraumatic.  Right Ear: External ear normal.  Left Ear: External ear normal.  Eyes: Pupils are equal, round, and reactive to light. Conjunctivae and EOM are normal.  Cardiovascular: Normal rate, regular rhythm, normal heart sounds and intact distal pulses.    Pulmonary/Chest: Effort normal and breath sounds normal.  Musculoskeletal: Normal range of motion.  Neurological: She is alert and oriented to person, place, and time.  Skin: Skin is warm and dry.  Psychiatric: She has a normal mood and affect. Her behavior is normal. Judgment and thought content normal.   Assessment & Plan:  1. Type 2 diabetes mellitus without complication, with long-term current use of insulin ([facility]) -Continue current regimen. CBG today non-fasting 129 which indicated good control. Current A1C 9.1 2. Need for influenza vaccination - Flu Vaccine QUAD 36+ mos IM  RTC: 2 month for diabetes follow-up.   Carroll Sage. Kenton Kingfisher, MSN, FNP-C [facility]  [address] [place], [place] [address] [phone]

## 2017-06-14 ENCOUNTER — Other Ambulatory Visit: Payer: Self-pay | Admitting: *Deleted

## 2017-06-14 MED ORDER — INSULIN GLARGINE 100 UNIT/ML SOLOSTAR PEN
10.0000 [IU] | PEN_INJECTOR | Freq: Every day | SUBCUTANEOUS | 3 refills | Status: DC
Start: 2017-06-14 — End: 2017-09-09

## 2017-06-14 NOTE — Telephone Encounter (Signed)
PRINTED FOR PASS PROGRAM 

## 2017-06-17 MED FILL — ?METFORMIN HCL 1,000 MG TAB: 1000 | 30 days supply | Qty: 60 | Fill #0

## 2017-06-17 MED FILL — !LANTUS SOLOSTAR 100UNITS/M: 100 | 30 days supply | Qty: 3 | Fill #0

## 2017-06-17 MED FILL — LISINOPRIL 2.5 MG TABLET: 2.5 | 30 days supply | Qty: 30 | Fill #0

## 2017-06-17 MED FILL — PRAVASTATIN NA 80 MG TAB: 80 | 30 days supply | Qty: 30 | Fill #0

## 2017-07-12 MED FILL — LISINOPRIL 2.5 MG TABLET: 2.5 | 30 days supply | Qty: 30 | Fill #1

## 2017-07-12 MED FILL — $LANTUS SOLOSTAR 100 UNITS/: 100 | 30 days supply | Qty: 3 | Fill #1

## 2017-07-12 MED FILL — PRAVASTATIN NA 80 MG TAB: 80 | 30 days supply | Qty: 30 | Fill #1

## 2017-08-11 MED FILL — LISINOPRIL 2.5 MG TABLET: 2.5 | 30 days supply | Qty: 30 | Fill #2

## 2017-08-11 MED FILL — PRAVASTATIN NA 80 MG TAB: 80 | 30 days supply | Qty: 30 | Fill #2

## 2017-08-11 MED FILL — !LANTUS SOLOSTAR 100UNITS/M: 100 | 30 days supply | Qty: 3 | Fill #2

## 2017-08-16 MED FILL — TRUEPLUS PEN NDL 31GX5/16": 31G X 8 MM | 50 days supply | Qty: 100 | Fill #1

## 2017-08-16 MED FILL — TRUEPLUS PEN NDL 31GX5/16: 31G X 8 MM | 50 days supply | Qty: 100 | Fill #1

## 2017-09-09 ENCOUNTER — Ambulatory Visit (INDEPENDENT_AMBULATORY_CARE_PROVIDER_SITE_OTHER): Payer: Self-pay | Admitting: Family Medicine

## 2017-09-09 ENCOUNTER — Encounter: Payer: Self-pay | Admitting: Family Medicine

## 2017-09-09 VITALS — BP 124/72 | HR 92 | Temp 98.5°F | Resp 18 | Ht 66.0 in | Wt 202.0 lb

## 2017-09-09 DIAGNOSIS — Z794 Long term (current) use of insulin: Secondary | ICD-10-CM

## 2017-09-09 DIAGNOSIS — E119 Type 2 diabetes mellitus without complications: Secondary | ICD-10-CM

## 2017-09-09 DIAGNOSIS — E782 Mixed hyperlipidemia: Secondary | ICD-10-CM

## 2017-09-09 LAB — POCT URINALYSIS DIP (DEVICE)
BILIRUBIN URINE: NEGATIVE
GLUCOSE, UA: NEGATIVE mg/dL
Hgb urine dipstick: NEGATIVE
Ketones, ur: NEGATIVE mg/dL
LEUKOCYTES UA: NEGATIVE
NITRITE: NEGATIVE
PH: 6 (ref 5.0–8.0)
Protein, ur: NEGATIVE mg/dL
Specific Gravity, Urine: 1.01 (ref 1.005–1.030)
Urobilinogen, UA: 0.2 mg/dL (ref 0.0–1.0)

## 2017-09-09 LAB — POCT GLYCOSYLATED HEMOGLOBIN (HGB A1C): Hemoglobin A1C: 6

## 2017-09-09 LAB — LIPID PANEL
CHOL/HDL RATIO: 5.5 (calc) — AB (ref <5.0)
Cholesterol: 231 mg/dL — ABNORMAL HIGH (ref <200)
HDL: 42 mg/dL — AB (ref >50)
LDL Cholesterol (Calc): 137 mg/dL (calc) — ABNORMAL HIGH
NON-HDL CHOLESTEROL (CALC): 189 mg/dL — AB (ref <130)
Triglycerides: 366 mg/dL — ABNORMAL HIGH (ref <150)

## 2017-09-09 MED ORDER — INSULIN GLARGINE 100 UNIT/ML SOLOSTAR PEN: 5.0000 [IU] | PEN_INJECTOR | Freq: Every day | SUBCUTANEOUS | 3 refills | Status: DC

## 2017-09-09 NOTE — Patient Instructions (Signed)
I have decreased your lantus to 5 units at bedtime. If you experience

## 2017-09-09 NOTE — Progress Notes (Signed)
Patient ID: [name], female    DOB: 29-Mar-1981, 36 y.o.   MRN: [id number]  PCP: Scot Jun, FNP  Chief Complaint  Patient presents with  . Follow-up    3 MONTH    Subjective:  HPI [name] is a 36 y.o. female presents for a diabetes follow-up. [name] doesn't monitor glucose at home. She reports adherence to current medication regimen. Denies any associated urinary frequency, visual disturbances, episodic diaphoresis or hot flashes. Last A1C  9.1.She has lossed 12lbs within the last 3 months. Current Body mass index is 32.6 kg/m. She has adopted a strict modified carbohydrate meal plan and engages in physical activity. [name] also suffers from hypercholesterinemia with last total cholesterol  337 and triglycerides 501. She is currently prescribed statin therapy.  Social History   Socioeconomic History  . Marital status: Married    Spouse name: Not on file  . Number of children: Not on file  . Years of education: Not on file  . Highest education level: Not on file  Social Needs  . Financial resource strain: Not on file  . Food insecurity - worry: Not on file  . Food insecurity - inability: Not on file  . Transportation needs - medical: Not on file  . Transportation needs - non-medical: Not on file  Occupational History  . Not on file  Tobacco Use  . Smoking status: Current Every Day Smoker    Packs/day: 0.50    Years: 23.00    Pack years: 11.50    Types: Cigarettes  . Smokeless tobacco: Never Used  Substance and Sexual Activity  . Alcohol use: Yes    Comment: unsure amount (1/6) - states generally does not drink  . Drug use: No  . Sexual activity: Yes    Birth control/protection: None  Other Topics Concern  . Not on file  Social History Narrative  . Not on file    Family History  Problem Relation Age of Onset  . Diabetes insipidus Father   . Arthritis Father   . Heart failure Father   . COPD Father   . Emphysema Father    . Asthma Son    Review of Systems  Constitutional: Negative.   Eyes: Negative.   Respiratory: Negative.   Cardiovascular: Negative.   Gastrointestinal: Negative.   Endocrine: Negative.   Genitourinary: Negative.   Neurological: Negative.   Psychiatric/Behavioral: Negative.       Patient Active Problem List   Diagnosis Date Noted  . Overdose of antipsychotic 01/23/2017  . Hypoxia 01/17/2017  . Bipolar I disorder, most recent episode depressed ([place]) 06/11/2013  . Depression with suicidal ideation 06/09/2013  . Alcohol abuse, episodic drinking behavior 06/09/2013  . Major depressive disorder, recurrent episode, severe, specified as with psychotic behavior 06/09/2013  . 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 SNORING 07/18/2009  . OVARIAN CYST 06/13/2009  . HYPERCHOLESTEROLEMIA 09/19/2008  . BACK PAIN 09/18/2008  . TOBACCO ABUSE 08/13/2008  . INSOMNIA 08/13/2008    Allergies  Allergen Reactions  . Orange Oil Shortness Of Breath  . Carisoprodol     Skin peeled   . Piroxicam     Skin peeled    Prior to Admission medications   Medication Sig Start Date End Date Taking? Authorizing Provider  acetaminophen (TYLENOL) 325 MG tablet Take 2 tablets (650 mg total) by mouth every 6 (six) hours as needed for mild pain or moderate pain. 05/06/17  Yes Dansie,  Gwyndolyn Saxon, PA-C  albuterol (PROVENTIL HFA;VENTOLIN HFA) 108 (90 Base) MCG/ACT inhaler Inhale 1-2 puffs into the lungs every 6 (six) hours as needed for wheezing or shortness of breath. 01/18/17  Yes Hoffman, Jessica Ratliff, DO  ARIPiprazole (ABILIFY) 5 MG tablet Take 1 tablet (5 mg total) by mouth daily. For major depressive disorder. 06/13/13  Yes Mashburn, Marlane Hatcher, PA-C  aspirin EC 81 MG tablet Take 1 tablet (81 mg total) by mouth daily. 05/10/17  Yes Scot Jun, FNP  blood glucose meter kit and supplies KIT Dispense based on patient and insurance preference. Use up to four  times daily as directed. (FOR ICD-9 250.00, 250.01). 05/10/17  Yes Scot Jun, FNP  Brexpiprazole (REXULTI PO) Take 0.5 tablets by mouth daily. Dose unknown. ( pt gets at [facility])    Yes [provider]  Brexpiprazole 0.5 MG TABS Take 0.5 mg by mouth daily.   Yes [provider]  gabapentin (NEURONTIN) 300 MG capsule Take 1-2 capsules (300-600 mg total) by mouth 3 (three) times daily. Only take as prescribed.  Husband is to administer medication Patient taking differently: Take 300-600 mg by mouth 2 (two) times daily.  01/24/17  Yes Hoffman, Jessica Ratliff, DO  Insulin Glargine (LANTUS SOLOSTAR) 100 UNIT/ML Solostar Pen Inject 10 Units into the skin daily at 10 pm. 06/14/17  Yes Jegede, Olugbemiga E, MD  Insulin Pen Needle (NOVOFINE) 30G X 8 MM MISC Inject 10 each into the skin as needed. 05/10/17  Yes Scot Jun, FNP  lisinopril (PRINIVIL,ZESTRIL) 2.5 MG tablet Take 1 tablet (2.5 mg total) by mouth daily. 05/10/17  Yes Scot Jun, FNP  lurasidone (LATUDA) 80 MG TABS tablet Take 1 tablet (80 mg total) by mouth daily with breakfast. Only take as prescribed.  Husband is to administer medication. 01/24/17  Yes Kalman Shan Ratliff, DO  metFORMIN (GLUCOPHAGE) 1000 MG tablet Take 1 tablet (1,000 mg total) by mouth 2 (two) times daily with a meal. 05/10/17 11/06/17 Yes Scot Jun, FNP  pravastatin (PRAVACHOL) 80 MG tablet Take 1 tablet (80 mg total) by mouth daily. 05/11/17  Yes Scot Jun, FNP  sertraline (ZOLOFT) 100 MG tablet Take 2 tablets (200 mg total) by mouth daily. For depression and anxiety. 06/13/13  Yes Mashburn, Marlane Hatcher, PA-C  traZODone (DESYREL) 100 MG tablet Take 200 mg by mouth at bedtime as needed for sleep. For insomnia.  06/13/13  Yes Mashburn, Marlane Hatcher, PA-C    Past Medical, Surgical Family and Social History reviewed and updated.    Objective:   Today's Vitals   09/09/17 1022  BP: 124/72  Pulse: 92  Resp: 18  Temp: 98.5 F (36.9 C)   TempSrc: Oral  SpO2: 98%  Weight: 202 lb (91.6 kg)  Height: '5\' 6"'$  (1.676 m)    Wt Readings from Last 3 Encounters:  09/09/17 202 lb (91.6 kg)  06/10/17 214 lb 3.2 oz (97.2 kg)  05/20/17 214 lb (97.1 kg)    Physical Exam Constitutional: She is oriented to person, place, and time. She appears well-developed and well-nourished.  HENT:  Head: Normocephalic and atraumatic.  Eyes: Pupils are equal, round, and reactive to light. Conjunctivae and EOM are normal.  Neck: Normal range of motion. Neck supple.  Cardiovascular: Normal rate, regular rhythm, normal heart sounds and intact distal pulses.   Pulmonary/Chest: Effort normal and breath sounds normal.  Abdominal: Soft. Bowel sounds are normal.  Musculoskeletal: Normal range of motion.  Lymphadenopathy:    She has no  cervical adenopathy.  Neurological: She is alert and oriented to person, place, and time.  Skin: Skin is warm and dry.  Psychiatric: She has a normal mood and affect. Her behavior is normal. Judgment and thought content within normal range.   Assessment & Plan:  1. Type 2 diabetes mellitus without complication, with long-term current use of insulin ([place]), A1C  6.0, well controlled. Decreasing Lantus 5 units at bedtime only.  Continue modified carbohydrate diet.  10 you exercise with a goal of 150 minutes/week.  2. Hypercholesterolemia with hypertriglyceridemia, recheck in a lipid panel today.  She should continue pravastatin 80 mg once daily.  Lipid panel not at goal we will consider adding the gemfibrozil and/or Zetia to improve triglycerides and/or total cholesterol.   Orders Placed This Encounter  Procedures  . Lipid panel  . POCT glycosylated hemoglobin (Hb A1C)  . POCT urinalysis dip (device)   Meds ordered this encounter  Medications  . Insulin Glargine (LANTUS SOLOSTAR) 100 UNIT/ML Solostar Pen    Sig: Inject 5 Units into the skin daily at 10 pm.    Dispense:  15 mL    Refill:  3    Order Specific  Question:   Supervising Provider    Answer:   Tresa Garter [[id number]]   Return for care in 3 months for chronic conditions.  A1c and fasting lipid panel.  Carroll Sage. Kenton Kingfisher, MSN, FNP-C [facility]  [address] [place], [place] [address] [phone]

## 2017-09-14 ENCOUNTER — Telehealth: Payer: Self-pay | Admitting: Family Medicine

## 2017-09-14 NOTE — Telephone Encounter (Signed)
Left a vm for patient to callback 

## 2017-09-14 NOTE — Telephone Encounter (Signed)
Patient notified and states that she has been taking the Pravastatin on a regular basis.

## 2017-09-14 NOTE — Telephone Encounter (Signed)
Contact patient to advise her total cholesterol, triglycerides, and  LDLs which are all portions of her cholesterol panel remain very elevated.  Her HDL which is her good cholesterol remains low.  Verify that she is continuing to take  pravastatin daily at dinnertime on a consistent basis.  If she is continue to take medication consistently I will need to add an additional medication in efforts to improve her overall total cholesterol which will decrease her risk of cardiovascular disease.  Godfrey PickKimberly S. Tiburcio PeaHarris, MSN, FNP-C [facility]  [address] [place], [place] [address] [phone]

## 2017-09-15 MED ORDER — EZETIMIBE 10 MG PO TABS
10.0000 mg | ORAL_TABLET | Freq: Every day | ORAL | 3 refills | Status: DC
Start: 2017-09-15 — End: 2017-09-26

## 2017-09-15 NOTE — Addendum Note (Signed)
Addended by: Bing NeighborsHARRIS, Kilan Banfill S on: 09/15/2017 04:49 AM   Modules accepted: Orders

## 2017-09-15 NOTE — Telephone Encounter (Signed)
Patient notified

## 2017-09-15 NOTE — Telephone Encounter (Signed)
Advise patient I am prescribing Zetia which is an adjunct medication used to improve cholesterol. I am sending prescription to pharmacy on file however, if medication is too expensive, [facility]  Hady Niemczyk S. Tiburcio PeaHarris, MSN, FNP-C [facility]  [address] [place], [place] [address] [phone]

## 2017-09-17 ENCOUNTER — Encounter: Payer: Self-pay | Admitting: Family Medicine

## 2017-09-26 ENCOUNTER — Telehealth: Payer: Self-pay | Admitting: Family Medicine

## 2017-09-26 MED ORDER — EZETIMIBE 10 MG PO TABS: 10.0000 mg | ORAL_TABLET | Freq: Every day | ORAL | 3 refills | Status: AC

## 2017-09-26 MED FILL — ?EZETIMIBE 10MG TAB: 10 | 30 days supply | Qty: 30 | Fill #0

## 2017-09-26 NOTE — Telephone Encounter (Signed)
Patient called because she needs her Zetia sent to the [facility] instead of the [facility].

## 2017-09-26 NOTE — Telephone Encounter (Signed)
Medication sent to community health per patient

## 2017-09-27 MED FILL — PRAVASTATIN NA 80 MG TAB: 80 | 30 days supply | Qty: 30 | Fill #3

## 2017-09-27 MED FILL — $LANTUS SOLOSTAR 100 UNITS/: 100 | 30 days supply | Qty: 3 | Fill #3

## 2017-09-27 MED FILL — LISINOPRIL 2.5 MG TABLET: 2.5 | 30 days supply | Qty: 30 | Fill #3

## 2017-10-26 MED FILL — ?EZETIMIBE 10MG TAB: 10 | 30 days supply | Qty: 30 | Fill #1

## 2017-10-26 MED FILL — PRAVASTATIN SODIUM 80 MG TA: 80 | 30 days supply | Qty: 30 | Fill #4

## 2017-12-19 MED FILL — TRUEPLUS PEN NDL 31GX5/16: 31G X 8 MM | 50 days supply | Qty: 100 | Fill #2

## 2017-12-19 MED FILL — $LANTUS SOLOSTAR 100 UNITS/: 100 | 28 days supply | Qty: 3 | Fill #0

## 2017-12-19 MED FILL — PRAVASTATIN SODIUM 80 MG TA: 80 | 30 days supply | Qty: 30 | Fill #5

## 2017-12-19 MED FILL — TRUEPLUS PEN NDL 31GX5/16": 31G X 8 MM | 50 days supply | Qty: 100 | Fill #2

## 2017-12-19 MED FILL — metFORMIN HCL 1000 MG TABS: 1000 | 30 days supply | Qty: 60 | Fill #1

## 2017-12-19 MED FILL — LISINOPRIL 2.5 MG TABLET: 2.5 | 30 days supply | Qty: 30 | Fill #4

## 2018-01-09 ENCOUNTER — Ambulatory Visit (INDEPENDENT_AMBULATORY_CARE_PROVIDER_SITE_OTHER): Payer: Self-pay | Admitting: Family Medicine

## 2018-01-09 ENCOUNTER — Encounter: Payer: Self-pay | Admitting: Family Medicine

## 2018-01-09 VITALS — BP 112/68 | HR 98 | Temp 98.0°F | Resp 16 | Ht 66.0 in | Wt 210.0 lb

## 2018-01-09 DIAGNOSIS — Z794 Long term (current) use of insulin: Secondary | ICD-10-CM

## 2018-01-09 DIAGNOSIS — E119 Type 2 diabetes mellitus without complications: Secondary | ICD-10-CM

## 2018-01-09 LAB — POCT URINALYSIS DIP (DEVICE)
BILIRUBIN URINE: NEGATIVE
Glucose, UA: NEGATIVE mg/dL
KETONES UR: NEGATIVE mg/dL
Leukocytes, UA: NEGATIVE
Nitrite: NEGATIVE
PH: 5 (ref 5.0–8.0)
PROTEIN: NEGATIVE mg/dL
Specific Gravity, Urine: 1.01 (ref 1.005–1.030)
Urobilinogen, UA: 0.2 mg/dL (ref 0.0–1.0)

## 2018-01-09 LAB — POCT GLYCOSYLATED HEMOGLOBIN (HGB A1C): HEMOGLOBIN A1C: 6.5

## 2018-01-09 MED ORDER — GLIPIZIDE 10 MG PO TABS: 10.0000 mg | ORAL_TABLET | Freq: Every day | ORAL | 3 refills | Status: DC

## 2018-01-09 NOTE — Progress Notes (Signed)
Patient ID: [name], female    DOB: 1981/04/19, 37 y.o.   MRN: [id number]  PCP: Scot Jun, FNP  Chief Complaint  Patient presents with  . Follow-up    3 month on chronic condition    Subjective:  HPI  [name] is a 37 y.o. female presents for evaluation of diabetes. She endorses that she has been compliant with her current medication regimen and wishes to come off lantus insulin. She endorses that her blood sugars at home have been in range of 120's to 150's with no hypoglycemic events. She endorses that she is in good spirit and her depression and mood is well and she is feeling good. She denies any dizziness, lightheadedness, or blurred vision. She denies any chest pain, palpitations, or shortness of breath.   Social History   Socioeconomic History  . Marital status: Married    Spouse name: Not on file  . Number of children: Not on file  . Years of education: Not on file  . Highest education level: Not on file  Occupational History  . Not on file  Social Needs  . Financial resource strain: Not on file  . Food insecurity:    Worry: Not on file    Inability: Not on file  . Transportation needs:    Medical: Not on file    Non-medical: Not on file  Tobacco Use  . Smoking status: Current Every Day Smoker    Packs/day: 0.50    Years: 23.00    Pack years: 11.50    Types: Cigarettes  . Smokeless tobacco: Never Used  Substance and Sexual Activity  . Alcohol use: Yes    Comment: unsure amount (1/6) - states generally does not drink  . Drug use: No  . Sexual activity: Yes    Birth control/protection: None  Lifestyle  . Physical activity:    Days per week: Not on file    Minutes per session: Not on file  . Stress: Not on file  Relationships  . Social connections:    Talks on phone: Not on file    Gets together: Not on file    Attends religious service: Not on file    Active member of club or organization: Not on file    Attends  meetings of clubs or organizations: Not on file    Relationship status: Not on file  . Intimate partner violence:    Fear of current or ex partner: Not on file    Emotionally abused: Not on file    Physically abused: Not on file    Forced sexual activity: Not on file  Other Topics Concern  . Not on file  Social History Narrative  . Not on file    Family History  Problem Relation Age of Onset  . Diabetes insipidus Father   . Arthritis Father   . Heart failure Father   . COPD Father   . Emphysema Father   . Asthma Son      Review of Systems  Constitutional: Negative.   Respiratory: Negative.   Cardiovascular: Negative.   Gastrointestinal: Negative.   Skin: Negative.   Neurological: Negative.   Psychiatric/Behavioral: Negative.     Patient Active Problem List   Diagnosis Date Noted  . Overdose of antipsychotic 01/23/2017  . Hypoxia 01/17/2017  . Bipolar I disorder, most recent episode depressed ([place]) 06/11/2013  . Depression with suicidal ideation 06/09/2013  . Alcohol abuse, episodic drinking behavior 06/09/2013  .  Major depressive disorder, recurrent episode, severe, specified as with psychotic behavior 06/09/2013  . 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 SNORING 07/18/2009  . OVARIAN CYST 06/13/2009  . HYPERCHOLESTEROLEMIA 09/19/2008  . BACK PAIN 09/18/2008  . TOBACCO ABUSE 08/13/2008  . INSOMNIA 08/13/2008    Allergies  Allergen Reactions  . Orange Oil Shortness Of Breath  . Carisoprodol     Skin peeled   . Piroxicam     Skin peeled    Prior to Admission medications   Medication Sig Start Date End Date Taking? Authorizing Provider  acetaminophen (TYLENOL) 325 MG tablet Take 2 tablets (650 mg total) by mouth every 6 (six) hours as needed for mild pain or moderate pain. 05/06/17  Yes Waynetta Pean, PA-C  albuterol (PROVENTIL HFA;VENTOLIN HFA) 108 (90 Base) MCG/ACT inhaler Inhale 1-2 puffs into the  lungs every 6 (six) hours as needed for wheezing or shortness of breath. 01/18/17  Yes Hoffman, Jessica Ratliff, DO  ARIPiprazole (ABILIFY) 5 MG tablet Take 1 tablet (5 mg total) by mouth daily. For major depressive disorder. 06/13/13  Yes Mashburn, Marlane Hatcher, PA-C  aspirin EC 81 MG tablet Take 1 tablet (81 mg total) by mouth daily. 05/10/17  Yes Scot Jun, FNP  blood glucose meter kit and supplies KIT Dispense based on patient and insurance preference. Use up to four times daily as directed. (FOR ICD-9 250.00, 250.01). 05/10/17  Yes Scot Jun, FNP  Brexpiprazole (REXULTI PO) Take 0.5 tablets by mouth daily. Dose unknown. ( pt gets at [facility])    Yes [provider]  ezetimibe (ZETIA) 10 MG tablet Take 1 tablet (10 mg total) by mouth daily. 09/26/17  Yes Scot Jun, FNP  gabapentin (NEURONTIN) 300 MG capsule Take 1-2 capsules (300-600 mg total) by mouth 3 (three) times daily. Only take as prescribed.  Husband is to administer medication Patient taking differently: Take 300-600 mg by mouth 2 (two) times daily.  01/24/17  Yes Hoffman, Jessica Ratliff, DO  lisinopril (PRINIVIL,ZESTRIL) 2.5 MG tablet Take 1 tablet (2.5 mg total) by mouth daily. 05/10/17  Yes Scot Jun, FNP  lurasidone (LATUDA) 80 MG TABS tablet Take 1 tablet (80 mg total) by mouth daily with breakfast. Only take as prescribed.  Husband is to administer medication. 01/24/17  Yes Hoffman, Jessica Ratliff, DO  pravastatin (PRAVACHOL) 80 MG tablet Take 1 tablet (80 mg total) by mouth daily. 05/11/17  Yes Scot Jun, FNP  sertraline (ZOLOFT) 100 MG tablet Take 2 tablets (200 mg total) by mouth daily. For depression and anxiety. 06/13/13  Yes Mashburn, Marlane Hatcher, PA-C  traZODone (DESYREL) 100 MG tablet Take 200 mg by mouth at bedtime as needed for sleep. For insomnia.  06/13/13  Yes Mashburn, Milta Deiters T, PA-C  glipiZIDE (GLUCOTROL) 10 MG tablet Take 1 tablet (10 mg total) by mouth daily before breakfast. 01/09/18    Scot Jun, FNP  metFORMIN (GLUCOPHAGE) 1000 MG tablet Take 1 tablet (1,000 mg total) by mouth 2 (two) times daily with a meal. 05/10/17 11/06/17  Scot Jun, FNP    Past Medical, Surgical Family and Social History reviewed and updated.    Objective:   Today's Vitals   01/09/18 1105  BP: 112/68  Pulse: 98  Resp: 16  Temp: 98 F (36.7 C)  TempSrc: Oral  SpO2: 100%  Weight: 210 lb (95.3 kg)  Height: _0  (1.676 m)    Wt Readings from Last 3 Encounters:  01/09/18 210  lb (95.3 kg)  09/09/17 202 lb (91.6 kg)  06/10/17 214 lb 3.2 oz (97.2 kg)    Physical Exam  Constitutional: She is oriented to person, place, and time. She appears well-developed and well-nourished.  HENT:  Head: Normocephalic.  Eyes: Pupils are equal, round, and reactive to light. Conjunctivae and EOM are normal.  Neck: Normal range of motion. Neck supple.  Cardiovascular: Normal rate, regular rhythm and normal heart sounds.  Pulmonary/Chest: Effort normal and breath sounds normal.  Musculoskeletal: Normal range of motion.  Neurological: She is alert and oriented to person, place, and time.  Skin: Skin is warm and dry.  Psychiatric: She has a normal mood and affect. Her behavior is normal. Judgment and thought content normal.           Assessment & Plan:  1. Type 2 diabetes mellitus without complication, with long-term current use of insulin ([facility])  - POCT glycosylated hemoglobin (Hb A1C) Current A1c is 6.5, will stop lantus isulin and start on glipizide 10 mg daily with breakfast. Will have the patient follow up and evaluate therapy change. Diabetes meal plan and support handout information given.  If symptoms worsen or do not improve, return for follow-up, follow-up with PCP, or at the emergency department if severity of symptoms warrant a higher level of care. Festus Aloe, FNP student

## 2018-01-09 NOTE — Progress Notes (Signed)
Patient ID: [name], female    DOB: 03-28-1981, 37 y.o.   MRN: [id number]  PCP: Amy Jun, FNP  Chief Complaint  Patient presents with  . Follow-up    3 month on chronic condition    Subjective:  HPI [name] is a 37 y.o. female with type 2 diabetes, bipolar, hyperlipidemia, current smoker presents for a 3 month diabetes follow-up. [name] monitors glucose at home consistently.  Last A1C 6.0. Discussed during last visit discontinuing Lantus if A1C remained controlled. Reports blood sugars range 100-125 consistently. Remains inactive of physical activity. Currently followed by an outside mental health provider for management of bipolar disorder. Continues to smoke about 1/2 pack per day, although reports efforts to cut back and plans to completely quit soon.  Continue to adhere to a very strict carbohydrate limited diet to manage diabetes. She has no other complaints today. Denies chest pain, shortness of breath, dizziness, or new weakness. Social History   Socioeconomic History  . Marital status: Married    Spouse name: Not on file  . Number of children: Not on file  . Years of education: Not on file  . Highest education level: Not on file  Occupational History  . Not on file  Social Needs  . Financial resource strain: Not on file  . Food insecurity:    Worry: Not on file    Inability: Not on file  . Transportation needs:    Medical: Not on file    Non-medical: Not on file  Tobacco Use  . Smoking status: Current Every Day Smoker    Packs/day: 0.50    Years: 23.00    Pack years: 11.50    Types: Cigarettes  . Smokeless tobacco: Never Used  Substance and Sexual Activity  . Alcohol use: Yes    Comment: unsure amount (1/6) - states generally does not drink  . Drug use: No  . Sexual activity: Yes    Birth control/protection: None  Lifestyle  . Physical activity:    Days per week: Not on file    Minutes per session: Not on file  .  Stress: Not on file  Relationships  . Social connections:    Talks on phone: Not on file    Gets together: Not on file    Attends religious service: Not on file    Active member of club or organization: Not on file    Attends meetings of clubs or organizations: Not on file    Relationship status: Not on file  . Intimate partner violence:    Fear of current or ex partner: Not on file    Emotionally abused: Not on file    Physically abused: Not on file    Forced sexual activity: Not on file  Other Topics Concern  . Not on file  Social History Narrative  . Not on file    Family History  Problem Relation Age of Onset  . Diabetes insipidus Father   . Arthritis Father   . Heart failure Father   . COPD Father   . Emphysema Father   . Asthma Son    Review of Systems Pertinent negatives listed in HPI  Patient Active Problem List   Diagnosis Date Noted  . Overdose of antipsychotic 01/23/2017  . Hypoxia 01/17/2017  . Bipolar I disorder, most recent episode depressed ([place]) 06/11/2013  . Depression with suicidal ideation 06/09/2013  . Alcohol abuse, episodic drinking behavior 06/09/2013  . Major depressive  disorder, recurrent episode, severe, specified as with psychotic behavior 06/09/2013  . 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 SNORING 07/18/2009  . OVARIAN CYST 06/13/2009  . HYPERCHOLESTEROLEMIA 09/19/2008  . BACK PAIN 09/18/2008  . TOBACCO ABUSE 08/13/2008  . INSOMNIA 08/13/2008    Allergies  Allergen Reactions  . Orange Oil Shortness Of Breath  . Carisoprodol     Skin peeled   . Piroxicam     Skin peeled    Prior to Admission medications   Medication Sig Start Date End Date Taking? Authorizing Provider  acetaminophen (TYLENOL) 325 MG tablet Take 2 tablets (650 mg total) by mouth every 6 (six) hours as needed for mild pain or moderate pain. 05/06/17  Yes Waynetta Pean, PA-C  albuterol (PROVENTIL HFA;VENTOLIN  HFA) 108 (90 Base) MCG/ACT inhaler Inhale 1-2 puffs into the lungs every 6 (six) hours as needed for wheezing or shortness of breath. 01/18/17  Yes Hoffman, Jessica Ratliff, DO  ARIPiprazole (ABILIFY) 5 MG tablet Take 1 tablet (5 mg total) by mouth daily. For major depressive disorder. 06/13/13  Yes Mashburn, Marlane Hatcher, PA-C  aspirin EC 81 MG tablet Take 1 tablet (81 mg total) by mouth daily. 05/10/17  Yes Amy Jun, FNP  blood glucose meter kit and supplies KIT Dispense based on patient and insurance preference. Use up to four times daily as directed. (FOR ICD-9 250.00, 250.01). 05/10/17  Yes Amy Jun, FNP  Brexpiprazole (REXULTI PO) Take 0.5 tablets by mouth daily. Dose unknown. ( pt gets at [facility])    Yes [provider]  ezetimibe (ZETIA) 10 MG tablet Take 1 tablet (10 mg total) by mouth daily. 09/26/17  Yes Amy Jun, FNP  gabapentin (NEURONTIN) 300 MG capsule Take 1-2 capsules (300-600 mg total) by mouth 3 (three) times daily. Only take as prescribed.  Husband is to administer medication Patient taking differently: Take 300-600 mg by mouth 2 (two) times daily.  01/24/17  Yes Hoffman, Jessica Ratliff, DO  Insulin Glargine (LANTUS SOLOSTAR) 100 UNIT/ML Solostar Pen Inject 5 Units into the skin daily at 10 pm. 09/09/17  Yes Amy Jun, FNP  Insulin Pen Needle (NOVOFINE) 30G X 8 MM MISC Inject 10 each into the skin as needed. 05/10/17  Yes Amy Jun, FNP  lisinopril (PRINIVIL,ZESTRIL) 2.5 MG tablet Take 1 tablet (2.5 mg total) by mouth daily. 05/10/17  Yes Amy Jun, FNP  lurasidone (LATUDA) 80 MG TABS tablet Take 1 tablet (80 mg total) by mouth daily with breakfast. Only take as prescribed.  Husband is to administer medication. 01/24/17  Yes Hoffman, Jessica Ratliff, DO  pravastatin (PRAVACHOL) 80 MG tablet Take 1 tablet (80 mg total) by mouth daily. 05/11/17  Yes Amy Jun, FNP  sertraline (ZOLOFT) 100 MG tablet Take 2 tablets (200 mg total)  by mouth daily. For depression and anxiety. 06/13/13  Yes Mashburn, Marlane Hatcher, PA-C  traZODone (DESYREL) 100 MG tablet Take 200 mg by mouth at bedtime as needed for sleep. For insomnia.  06/13/13  Yes Nena Polio T, PA-C  metFORMIN (GLUCOPHAGE) 1000 MG tablet Take 1 tablet (1,000 mg total) by mouth 2 (two) times daily with a meal. 05/10/17 11/06/17  Amy Jun, FNP    Past Medical, Surgical Family and Social History reviewed and updated.    Objective:   Today's Vitals   01/09/18 1105  BP: 112/68  Pulse: 98  Resp: 16  Temp: 98 F (36.7 C)  TempSrc: Oral  SpO2:  100%  Weight: 210 lb (95.3 kg)  Height: _0  (1.676 m)    Wt Readings from Last 3 Encounters:  01/09/18 210 lb (95.3 kg)  09/09/17 202 lb (91.6 kg)  06/10/17 214 lb 3.2 oz (97.2 kg)   Physical Exam Constitutional: Patient appears well-developed and well-nourished. No distress. HENT: Normocephalic, atraumatic. Eyes: Conjunctivae and EOM are normal. PERRLA, no scleral icterus. Neck: Normal ROM. Neck supple. No JVD. No tracheal deviation. No thyromegaly. CVS: RRR, S1/S2 +, no murmurs, no gallops, no carotid bruit.  Pulmonary: Effort and breath sounds normal, no stridor, rhonchi, wheezes, rales.  Musculoskeletal: Normal range of motion. No edema and no tenderness.  Neuro: Alert. Normal reflexes, muscle tone coordination. No cranial nerve deficit. Skin: Skin is warm and dry. No rash noted. Not diaphoretic. No erythema. No pallor. Psychiatric: Normal mood and affect. Behavior, judgment, thought content normal.   Assessment & Plan:  1. Type 2 diabetes mellitus without complication, with long-term current use of insulin ([place]), A1C 6.5. Discontinued Lantus today. Will start Glipizide 10 mg with breakfast daily. Continue metformin as prescribed. Highly recommended complete cessation of smoking. Continue to monitor blood glucose daily or at least a few times weekly and log readings. If readings begin to trend high return to  clinic before your 3 month follow-up.  Orders Placed This Encounter  Procedures  . POCT glycosylated hemoglobin (Hb A1C)  . POCT urinalysis dip (device)    RTC: 3-6 months for chronic condition management  Carroll Sage. Kenton Kingfisher, MSN, FNP-C [facility] [place]  [address] [place], [place] [address] [phone]

## 2018-01-09 NOTE — Patient Instructions (Signed)
I am taking you off the lantus insulin  I am starting you in glipizide 10mg  daily with breakfast  Continue to monitor your blood sugar and diet   Diabetes Mellitus and Nutrition When you have diabetes (diabetes mellitus), it is very important to have healthy eating habits because your blood sugar (glucose) levels are greatly affected by what you eat and drink. Eating healthy foods in the appropriate amounts, at about the same times every day, can help you:  Control your blood glucose.  Lower your risk of heart disease.  Improve your blood pressure.  Reach or maintain a healthy weight.  Every person with diabetes is different, and each person has different needs for a meal plan. Your health care provider may recommend that you work with a diet and nutrition specialist (dietitian) to make a meal plan that is best for you. Your meal plan may vary depending on factors such as:  The calories you need.  The medicines you take.  Your weight.  Your blood glucose, blood pressure, and cholesterol levels.  Your activity level.  Other health conditions you have, such as heart or kidney disease.  How do carbohydrates affect me? Carbohydrates affect your blood glucose level more than any other type of food. Eating carbohydrates naturally increases the amount of glucose in your blood. Carbohydrate counting is a method for keeping track of how many carbohydrates you eat. Counting carbohydrates is important to keep your blood glucose at a healthy level, especially if you use insulin or take certain oral diabetes medicines. It is important to know how many carbohydrates you can safely have in each meal. This is different for every person. Your dietitian can help you calculate how many carbohydrates you should have at each meal and for snack. Foods that contain carbohydrates include:  Bread, cereal, rice, pasta, and crackers.  Potatoes and corn.  Peas, beans, and lentils.  Milk and  yogurt.  Fruit and juice.  Desserts, such as cakes, cookies, ice cream, and candy.  How does alcohol affect me? Alcohol can cause a sudden decrease in blood glucose (hypoglycemia), especially if you use insulin or take certain oral diabetes medicines. Hypoglycemia can be a life-threatening condition. Symptoms of hypoglycemia (sleepiness, dizziness, and confusion) are similar to symptoms of having too much alcohol. If your health care provider says that alcohol is safe for you, follow these guidelines:  Limit alcohol intake to no more than 1 drink per day for nonpregnant women and 2 drinks per day for men. One drink equals 12 oz of beer, 5 oz of wine, or 1 oz of hard liquor.  Do not drink on an empty stomach.  Keep yourself hydrated with water, diet soda, or unsweetened iced tea.  Keep in mind that regular soda, juice, and other mixers may contain a lot of sugar and must be counted as carbohydrates.  What are tips for following this plan? Reading food labels  Start by checking the serving size on the label. The amount of calories, carbohydrates, fats, and other nutrients listed on the label are based on one serving of the food. Many foods contain more than one serving per package.  Check the total grams (g) of carbohydrates in one serving. You can calculate the number of servings of carbohydrates in one serving by dividing the total carbohydrates by 15. For example, if a food has 30 g of total carbohydrates, it would be equal to 2 servings of carbohydrates.  Check the number of grams (g) of saturated and  trans fats in one serving. Choose foods that have low or no amount of these fats.  Check the number of milligrams (mg) of sodium in one serving. Most people should limit total sodium intake to less than 2,300 mg per day.  Always check the nutrition information of foods labeled as "low-fat" or "nonfat". These foods may be higher in added sugar or refined carbohydrates and should be  avoided.  Talk to your dietitian to identify your daily goals for nutrients listed on the label. Shopping  Avoid buying canned, premade, or processed foods. These foods tend to be high in fat, sodium, and added sugar.  Shop around the outside edge of the grocery store. This includes fresh fruits and vegetables, bulk grains, fresh meats, and fresh dairy. Cooking  Use low-heat cooking methods, such as baking, instead of high-heat cooking methods like deep frying.  Cook using healthy oils, such as olive, canola, or sunflower oil.  Avoid cooking with butter, cream, or high-fat meats. Meal planning  Eat meals and snacks regularly, preferably at the same times every day. Avoid going long periods of time without eating.  Eat foods high in fiber, such as fresh fruits, vegetables, beans, and whole grains. Talk to your dietitian about how many servings of carbohydrates you can eat at each meal.  Eat 4-6 ounces of lean protein each day, such as lean meat, chicken, fish, eggs, or tofu. 1 ounce is equal to 1 ounce of meat, chicken, or fish, 1 egg, or 1/4 cup of tofu.  Eat some foods each day that contain healthy fats, such as avocado, nuts, seeds, and fish. Lifestyle   Check your blood glucose regularly.  Exercise at least 30 minutes 5 or more days each week, or as told by your health care provider.  Take medicines as told by your health care provider.  Do not use any products that contain nicotine or tobacco, such as cigarettes and e-cigarettes. If you need help quitting, ask your health care provider.  Work with a Social worker or diabetes educator to identify strategies to manage stress and any emotional and social challenges. What are some questions to ask my health care provider?  Do I need to meet with a diabetes educator?  Do I need to meet with a dietitian?  What number can I call if I have questions?  When are the best times to check my blood glucose? Where to find more  information:  American Diabetes Association: diabetes.org/food-and-fitness/food  Academy of Nutrition and Dietetics: PokerClues.dk  [employer] of Diabetes and Digestive and Kidney Diseases (NIH): ContactWire.be Summary  A healthy meal plan will help you control your blood glucose and maintain a healthy lifestyle.  Working with a diet and nutrition specialist (dietitian) can help you make a meal plan that is best for you.  Keep in mind that carbohydrates and alcohol have immediate effects on your blood glucose levels. It is important to count carbohydrates and to use alcohol carefully. This information is not intended to replace advice given to you by your health care provider. Make sure you discuss any questions you have with your health care provider. Document Released: 06/24/2005 Document Revised: 11/01/2016 Document Reviewed: 11/01/2016 Elsevier Interactive Patient Education  [employer].

## 2018-04-16 ENCOUNTER — Encounter (HOSPITAL_COMMUNITY): Payer: Self-pay | Admitting: Emergency Medicine

## 2018-04-16 ENCOUNTER — Other Ambulatory Visit: Payer: Self-pay

## 2018-04-16 ENCOUNTER — Emergency Department (HOSPITAL_COMMUNITY)
Admission: EM | Admit: 2018-04-16 | Discharge: 2018-04-17 | Disposition: A | Discharge status: Home / Self Care | Payer: Managed Care, Other (non HMO) | Attending: Emergency Medicine | Admitting: Emergency Medicine

## 2018-04-16 ENCOUNTER — Emergency Department (HOSPITAL_COMMUNITY): Payer: Managed Care, Other (non HMO)

## 2018-04-16 DIAGNOSIS — Z7982 Long term (current) use of aspirin: Secondary | ICD-10-CM | POA: Diagnosis not present

## 2018-04-16 DIAGNOSIS — J40 Bronchitis, not specified as acute or chronic: Secondary | ICD-10-CM | POA: Diagnosis not present

## 2018-04-16 DIAGNOSIS — F17210 Nicotine dependence, cigarettes, uncomplicated: Secondary | ICD-10-CM | POA: Insufficient documentation

## 2018-04-16 DIAGNOSIS — M7918 Myalgia, other site: Secondary | ICD-10-CM | POA: Insufficient documentation

## 2018-04-16 DIAGNOSIS — Z79899 Other long term (current) drug therapy: Secondary | ICD-10-CM | POA: Insufficient documentation

## 2018-04-16 DIAGNOSIS — R05 Cough: Secondary | ICD-10-CM | POA: Diagnosis present

## 2018-04-16 LAB — BASIC METABOLIC PANEL
ANION GAP: 10 (ref 5–15)
BUN: 6 mg/dL (ref 6–20)
CALCIUM: 9.1 mg/dL (ref 8.9–10.3)
CHLORIDE: 102 mmol/L (ref 98–111)
CO2: 25 mmol/L (ref 22–32)
CREATININE: 0.67 mg/dL (ref 0.44–1.00)
GFR calc non Af Amer: >60 mL/min (ref >60)
Glucose, Bld: 137 mg/dL — ABNORMAL HIGH (ref 70–99)
Potassium: 3.1 mmol/L — ABNORMAL LOW (ref 3.5–5.1)
Sodium: 137 mmol/L (ref 135–145)

## 2018-04-16 LAB — URINALYSIS, ROUTINE W REFLEX MICROSCOPIC
BILIRUBIN URINE: NEGATIVE
Glucose, UA: NEGATIVE mg/dL
Hgb urine dipstick: NEGATIVE
KETONES UR: NEGATIVE mg/dL
LEUKOCYTES UA: NEGATIVE
NITRITE: NEGATIVE
Protein, ur: NEGATIVE mg/dL
SPECIFIC GRAVITY, URINE: 1.023 (ref 1.005–1.030)
pH: 6 (ref 5.0–8.0)

## 2018-04-16 LAB — TROPONIN I

## 2018-04-16 MED ORDER — ALBUTEROL SULFATE (2.5 MG/3ML) 0.083% IN NEBU
5.0000 mg | INHALATION_SOLUTION | Freq: Once | RESPIRATORY_TRACT | Status: AC
  Administered 2018-04-16: 5 mg via RESPIRATORY_TRACT
  Filled 2018-04-16: qty 6

## 2018-04-16 NOTE — ED Provider Notes (Signed)
[facility] EMERGENCY DEPARTMENT Provider Note   CSN: [id number] Arrival date & time: 04/16/18  [place]     History   Chief Complaint Chief Complaint  Patient presents with  . Cough    HPI [name] is a 37 y.o. female.  Patient is a 38 year old female who presents to the emergency department with a complaint of shortness of breath and cough.  The patient states that this problem is been going on for 4 days.  She says she feels as though she has to yawn in order to get a deep enough breath in.  Today she got overheated and it made the symptoms that she been feeling over the last 4 days worse.  This scared her and she came into the emergency department by EMS.  The patient states she has never received a respiratory diagnosis.  She is a smoker.  She says she has not been around any noxious gases.  She denies use of any recreational drugs.  She has not been out of the country recently.  She denies any hemoptysis, fever, chills, or any known sick contacts.  She has not had any injury to the chest or lung area.  The patient states she has had some chest tightness, but no chest pain.  No radiation of pain.  No loss of consciousness reported.     Past Medical History:  Diagnosis Date  . Anxiety   . Back pain   . CAP (community acquired pneumonia)   . Chronic pain syndrome   . Depression   . Gallbladder problem   . Obsessive compulsive disorder     Patient Active Problem List   Diagnosis Date Noted  . Overdose of antipsychotic 01/23/2017  . Hypoxia 01/17/2017  . Bipolar I disorder, most recent episode depressed ([place]) 06/11/2013  . Depression with suicidal ideation 06/09/2013  . Alcohol abuse, episodic drinking behavior 06/09/2013  . Major depressive disorder, recurrent episode, severe, specified as with psychotic behavior 06/09/2013  . 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 SNORING 07/18/2009  . OVARIAN CYST  06/13/2009  . HYPERCHOLESTEROLEMIA 09/19/2008  . BACK PAIN 09/18/2008  . TOBACCO ABUSE 08/13/2008  . INSOMNIA 08/13/2008    Past Surgical History:  Procedure Laterality Date  . CESAREAN SECTION    . CHOLECYSTECTOMY  2009  . wisdom teeth extraction       OB History    Gravida  1   Para  1   Term  1   Preterm      AB      Living  1     SAB      TAB      Ectopic      Multiple      Live Births  1            Home Medications    Prior to Admission medications   Medication Sig Start Date End Date Taking? Authorizing Provider  acetaminophen (TYLENOL) 325 MG tablet Take 2 tablets (650 mg total) by mouth every 6 (six) hours as needed for mild pain or moderate pain. 05/06/17  Yes Waynetta Pean, PA-C  ARIPiprazole (ABILIFY) 5 MG tablet Take 1 tablet (5 mg total) by mouth daily. For major depressive disorder. Patient taking differently: Take 10 mg by mouth every morning. For major depressive disorder. 06/13/13  Yes Mashburn, Marlane Hatcher, PA-C  aspirin EC 81 MG tablet Take 1 tablet (81 mg total) by mouth daily. 05/10/17  Yes Harris,  Carroll Sage, FNP  ezetimibe (ZETIA) 10 MG tablet Take 1 tablet (10 mg total) by mouth daily. 09/26/17  Yes Scot Jun, FNP  gabapentin (NEURONTIN) 300 MG capsule Take 1-2 capsules (300-600 mg total) by mouth 3 (three) times daily. Only take as prescribed.  Husband is to administer medication Patient taking differently: Take 300-600 mg by mouth See admin instructions. '300mg'$  in the morning and '600mg'$  at bedtime 01/24/17  Yes Hoffman, Jessica Ratliff, DO  lisinopril (PRINIVIL,ZESTRIL) 2.5 MG tablet Take 1 tablet (2.5 mg total) by mouth daily. 05/10/17  Yes Scot Jun, FNP  metFORMIN (GLUCOPHAGE) 1000 MG tablet Take 1 tablet (1,000 mg total) by mouth 2 (two) times daily with a meal. Patient taking differently: Take 500 mg by mouth.  05/10/17 04/16/18 Yes Scot Jun, FNP  phentermine 37.5 MG capsule Take 37.5 mg by mouth every morning.    Yes [provider]  pravastatin (PRAVACHOL) 80 MG tablet Take 1 tablet (80 mg total) by mouth daily. Patient taking differently: Take 80 mg by mouth every morning.  05/11/17  Yes Scot Jun, FNP  sertraline (ZOLOFT) 100 MG tablet Take 2 tablets (200 mg total) by mouth daily. For depression and anxiety. 06/13/13  Yes Mashburn, Marlane Hatcher, PA-C  traZODone (DESYREL) 100 MG tablet Take 200 mg by mouth at bedtime. For insomnia.  06/13/13  Yes Mashburn, Milta Deiters T, PA-C  blood glucose meter kit and supplies KIT Dispense based on patient and insurance preference. Use up to four times daily as directed. (FOR ICD-9 250.00, 250.01). 05/10/17   Scot Jun, FNP  glipiZIDE (GLUCOTROL) 10 MG tablet Take 1 tablet (10 mg total) by mouth daily before breakfast. Patient not taking: Reported on 04/16/2018 01/09/18   Scot Jun, FNP  lurasidone (LATUDA) 80 MG TABS tablet Take 1 tablet (80 mg total) by mouth daily with breakfast. Only take as prescribed.  Husband is to administer medication. Patient not taking: Reported on 04/16/2018 01/24/17   Valinda Party, DO    Family History Family History  Problem Relation Age of Onset  . Diabetes insipidus Father   . Arthritis Father   . Heart failure Father   . COPD Father   . Emphysema Father   . Asthma Son     Social History Social History   Tobacco Use  . Smoking status: Current Every Day Smoker    Packs/day: 0.50    Years: 23.00    Pack years: 11.50    Types: Cigarettes  . Smokeless tobacco: Never Used  Substance Use Topics  . Alcohol use: Yes    Comment: unsure amount (1/6) - states generally does not drink  . Drug use: No     Allergies   Orange oil; Carisoprodol; and Piroxicam   Review of Systems Review of Systems  Constitutional: Negative for activity change.       All ROS Neg except as noted in HPI  HENT: Negative for nosebleeds.   Eyes: Negative for photophobia and discharge.  Respiratory: Positive for cough, chest  tightness and shortness of breath. Negative for wheezing.   Cardiovascular: Negative for chest pain and palpitations.  Gastrointestinal: Negative for abdominal pain and blood in stool.  Genitourinary: Negative for dysuria, frequency and hematuria.  Musculoskeletal: Negative for arthralgias, back pain and neck pain.  Skin: Negative.   Neurological: Negative for dizziness, seizures and speech difficulty.  Psychiatric/Behavioral: Negative for confusion and hallucinations. The patient is nervous/anxious.      Physical Exam Updated Vital  Signs BP 119/84   Pulse (!) 104   Temp 98.4 F (36.9 C) (Oral)   Resp 20   Ht '5\' 6"'$  (1.676 m)   Wt 90.7 kg (200 lb)   LMP 03/27/2018   SpO2 98%   BMI 32.28 kg/m   Physical Exam  Constitutional: She appears well-developed and well-nourished. No distress.  HENT:  Head: Normocephalic and atraumatic.  Right Ear: External ear normal.  Left Ear: External ear normal.  Eyes: Conjunctivae are normal. Right eye exhibits no discharge. Left eye exhibits no discharge. No scleral icterus.  Neck: Neck supple. No tracheal deviation present.  Cardiovascular: Regular rhythm, normal heart sounds, intact distal pulses and normal pulses.  Occasional extrasystoles are present. Tachycardia present. Exam reveals no gallop.  Pulmonary/Chest: Effort normal and breath sounds normal. No stridor. No respiratory distress. She has no wheezes. She has no rales.  Abdominal: Soft. Bowel sounds are normal. She exhibits no distension. There is no tenderness. There is no rebound and no guarding.  Musculoskeletal: She exhibits no edema or tenderness.  Neurological: She is alert. She has normal strength. No cranial nerve deficit (no facial droop, extraocular movements intact, no slurred speech) or sensory deficit. She exhibits normal muscle tone. She displays no seizure activity. Coordination normal.  Skin: Skin is warm and dry. No rash noted.  Psychiatric: She has a normal mood and  affect.  Nursing note and vitals reviewed.    ED Treatments / Results  Labs (all labs ordered are listed, but only abnormal results are displayed) Labs Reviewed  BASIC METABOLIC PANEL  URINALYSIS, ROUTINE W REFLEX MICROSCOPIC  TROPONIN I    EKG None  Radiology Dg Chest 2 View  Result Date: 04/16/2018 CLINICAL DATA:  Shortness of breath.  Chest pain. EXAM: CHEST - 2 VIEW COMPARISON:  January 17, 2017 FINDINGS: The heart, hila, and mediastinum are normal. No pneumothorax. No pulmonary nodules or masses. No focal infiltrates identified. IMPRESSION: No active cardiopulmonary disease. Electronically Signed   By: Dorise Bullion III M.D   On: 04/16/2018 19:56    Procedures Procedures (including critical care time)  Medications Ordered in ED Medications  albuterol (PROVENTIL) (2.5 MG/3ML) 0.083% nebulizer solution 5 mg (5 mg Nebulization Given 04/16/18 2132)     Initial Impression / Assessment and Plan / ED Course  I have reviewed the triage vital signs and the nursing notes.  Pertinent labs & imaging results that were available during my care of the patient were reviewed by me and considered in my medical decision making (see chart for details).       Final Clinical Impressions(s) / ED Diagnoses MDM  Heart rate is slightly elevated, otherwise vital signs are within normal limits.  Pulse oximetry is 98% on room air.  Within normal limits by my interpretation.  The examination reveals occasional extrasystoles.  Electrocardiogram however shows a normal sinus rhythm.  No extrasystoles caught on telemetry. Troponin wnl.  I have examined the electrolytes.  The potassium is slightly low at 3.1.  Oral potassium given to the patient.  The patient states that she is having some back pain and some generalized muscle pain.  Intravenous Toradol and promethazine were given.  The patient was treated earlier with a nebulizer treatment.  There is no further wheezing there is no further problem with  shortness of breath or unusual cough.  Prescription for albuterol inhaler and a short course of steroid given to the patient to use.  Patient to follow-up with Dr. Kenton Kingfisher concerning her ongoing  pain issue as well as the cough and sensation of shortness of breath.     Final diagnoses:  Bronchitis  Musculoskeletal pain    ED Discharge Orders    None       Lily Kocher, PA-C 04/17/18 0052    Nat Christen, MD 04/17/18 [phone]

## 2018-04-16 NOTE — ED Triage Notes (Signed)
Onset of SOB x 4 day, dry cough.

## 2018-04-16 NOTE — ED Triage Notes (Signed)
Pt upset that she is having to wait. States "I came in by EMS and I have to wait in waiting room"

## 2018-04-17 MED ORDER — KETOROLAC TROMETHAMINE 60 MG/2ML IM SOLN: 60.0000 mg | Freq: Once | INTRAMUSCULAR | Status: DC

## 2018-04-17 MED ORDER — POTASSIUM CHLORIDE CRYS ER 20 MEQ PO TBCR
40.0000 meq | EXTENDED_RELEASE_TABLET | Freq: Once | ORAL | Status: AC
  Administered 2018-04-17: 40 meq via ORAL
  Filled 2018-04-17: qty 2

## 2018-04-17 MED ORDER — DEXAMETHASONE 4 MG PO TABS: 4.0000 mg | ORAL_TABLET | Freq: Two times a day (BID) | ORAL | 0 refills | Status: DC

## 2018-04-17 MED ORDER — PROMETHAZINE HCL 12.5 MG PO TABS: 12.5000 mg | ORAL_TABLET | Freq: Once | ORAL | Status: DC

## 2018-04-17 MED ORDER — PROMETHAZINE HCL 25 MG/ML IJ SOLN
12.5000 mg | Freq: Once | INTRAMUSCULAR | Status: AC
  Administered 2018-04-17: 12.5 mg via INTRAVENOUS
  Filled 2018-04-17: qty 1

## 2018-04-17 MED ORDER — ALBUTEROL SULFATE HFA 108 (90 BASE) MCG/ACT IN AERS: 1.0000 | INHALATION_SPRAY | Freq: Four times a day (QID) | RESPIRATORY_TRACT | 0 refills | Status: DC | PRN

## 2018-04-17 MED ORDER — KETOROLAC TROMETHAMINE 30 MG/ML IJ SOLN
30.0000 mg | Freq: Once | INTRAMUSCULAR | Status: AC
  Administered 2018-04-17: 30 mg via INTRAVENOUS
  Filled 2018-04-17: qty 1

## 2018-04-17 NOTE — ED Notes (Signed)
Pt ambulatory to waiting room. Pt verbalized understanding of discharge instructions.   

## 2018-04-17 NOTE — Discharge Instructions (Addendum)
Your chest x-ray is negative for pneumonia or extra fluid or mass or acute problem.  Your oxygen level is 98% on room air.  I question if your cough and sensation of shortness of breath may be related to a bronchitis.  Please use albuterol 2 puffs every 4 hours and Decadron 2 times daily.  Please see [name] for follow-up and recheck.  Please also discuss your ongoing back pain and musculoskeletal pain with her and seek her counsel for pain management.

## 2019-04-11 DIAGNOSIS — F988 Other specified behavioral and emotional disorders with onset usually occurring in childhood and adolescence: Secondary | ICD-10-CM | POA: Insufficient documentation

## 2019-04-11 DIAGNOSIS — E1165 Type 2 diabetes mellitus with hyperglycemia: Secondary | ICD-10-CM | POA: Insufficient documentation

## 2019-04-11 DIAGNOSIS — G629 Polyneuropathy, unspecified: Secondary | ICD-10-CM | POA: Insufficient documentation

## 2019-04-11 DIAGNOSIS — E785 Hyperlipidemia, unspecified: Secondary | ICD-10-CM | POA: Insufficient documentation

## 2019-04-11 DIAGNOSIS — IMO0002 Reserved for concepts with insufficient information to code with codable children: Secondary | ICD-10-CM | POA: Insufficient documentation

## 2019-04-11 DIAGNOSIS — E669 Obesity, unspecified: Secondary | ICD-10-CM | POA: Insufficient documentation

## 2019-05-29 ENCOUNTER — Other Ambulatory Visit: Payer: Self-pay | Admitting: Nurse Practitioner

## 2019-05-29 DIAGNOSIS — G8929 Other chronic pain: Secondary | ICD-10-CM

## 2019-06-15 ENCOUNTER — Ambulatory Visit
Admission: RE | Admit: 2019-06-15 | Discharge: 2019-06-15 | Disposition: A | Discharge status: Home / Self Care | Payer: Managed Care, Other (non HMO) | Source: Ambulatory Visit | Attending: Nurse Practitioner | Admitting: Nurse Practitioner

## 2019-06-15 ENCOUNTER — Other Ambulatory Visit: Payer: Self-pay

## 2019-06-15 DIAGNOSIS — G8929 Other chronic pain: Secondary | ICD-10-CM

## 2019-06-15 DIAGNOSIS — M545 Low back pain, unspecified: Secondary | ICD-10-CM

## 2019-06-19 ENCOUNTER — Other Ambulatory Visit: Payer: Managed Care, Other (non HMO)

## 2019-10-26 ENCOUNTER — Encounter: Payer: Self-pay | Admitting: Specialist

## 2019-10-26 ENCOUNTER — Other Ambulatory Visit: Payer: Self-pay

## 2019-10-26 ENCOUNTER — Ambulatory Visit: Payer: Managed Care, Other (non HMO) | Admitting: Specialist

## 2019-10-26 ENCOUNTER — Ambulatory Visit: Payer: Self-pay

## 2019-10-26 ENCOUNTER — Telehealth: Payer: Self-pay | Admitting: Nurse Practitioner

## 2019-10-26 VITALS — BP 115/81 | HR 97 | Ht 66.0 in | Wt 201.0 lb

## 2019-10-26 DIAGNOSIS — M545 Low back pain, unspecified: Secondary | ICD-10-CM

## 2019-10-26 DIAGNOSIS — M5136 Other intervertebral disc degeneration, lumbar region: Secondary | ICD-10-CM | POA: Diagnosis not present

## 2019-10-26 DIAGNOSIS — M5116 Intervertebral disc disorders with radiculopathy, lumbar region: Secondary | ICD-10-CM

## 2019-10-26 DIAGNOSIS — M4807 Spinal stenosis, lumbosacral region: Secondary | ICD-10-CM | POA: Diagnosis not present

## 2019-10-26 MED ORDER — DICLOFENAC SODIUM 50 MG PO TBEC: DELAYED_RELEASE_TABLET | ORAL | 0 refills | Status: AC

## 2019-10-26 NOTE — Patient Instructions (Addendum)
Avoid bending, stooping and avoid lifting weights greater than 10 lbs. Avoid prolong standing and walking. Avoid frequent bending and stooping  No lifting greater than 10 lbs. May use ice or moist heat for pain. Weight loss is of benefit. Handicap license is approved. Myelogram and post myelogram CT of the lumbar spine with flexion and extension radiographs. Antiinflamatory agents like diclofenac may help with pain due to inflamatory joint changes and posterior disc degeneration. Yoga or Gwynneth Albright Do  I recommend avoiding backwards bending, flexion exercises to decrease stress on the posterior disc space and the joints. A stationary bicycle or pool walking exercises, not long distance walking.  Hemp CBD capsules, amazon.com 5,000-7,000 mg per bottle, 60 capsules per bottle, take one capsule twice a day.  Follow-Up Instructions: No follow-ups on file.

## 2019-10-26 NOTE — Progress Notes (Signed)
Office Visit Note   Patient: [name]           Date of Birth: 01/27/1981           MRN: [id number] Visit Date: 10/26/2019              Requested by: Bing Neighbors, FNP [address] [place],  [place] [address] PCP: Bing Neighbors, FNP   Assessment & Plan: Visit Diagnoses:  1. Low back pain, unspecified back pain laterality, unspecified chronicity, unspecified whether sciatica present   2. Spinal stenosis of lumbosacral region   3. Lumbar disc herniation with radiculopathy   4. Degenerative disc disease, lumbar     Plan:Avoid bending, stooping and avoid lifting weights greater than 10 lbs. Avoid prolong standing and walking. Avoid frequent bending and stooping  No lifting greater than 10 lbs. May use ice or moist heat for pain. Weight loss is of benefit. Handicap license is approved. Myelogram and post myelogram CT of the lumbar spine with flexion and extension radiographs. Antiinflamatory agents like diclofenac may help with pain due to inflamatory joint changes and posterior disc degeneration. Yoga or Gwynneth Albright Do  I recommend avoiding backwards bending, flexion exercises to decrease stress on the posterior disc space and the joints. A stationary bicycle or pool walking exercises, not long distance walking.  Hemp CBD capsules, amazon.com 5,000-7,000 mg per bottle, 60 capsules per bottle, take one capsule twice a day.  Follow-Up Instructions: Return in about 3 weeks (around 11/16/2019).   Orders:  Orders Placed This Encounter  Procedures  . XR Lumbar Spine 2-3 Views  . DG Myelogram Lumbar  . CT LUMBAR SPINE W CONTRAST   Meds ordered this encounter  Medications  . diclofenac (VOLTAREN) 50 MG EC tablet    Sig: Take 1 tablet (50 mg total) by mouth daily after breakfast for 4 days, THEN 1 tablet (50 mg total) 2 (two) times daily after a meal for 26 days.    Dispense:  56 tablet    Refill:  0      Procedures: No procedures  performed   Clinical Data: No additional findings.   Subjective: Chief Complaint  Patient presents with  . Lower Back - Pain    39 year old female with a 12 year history of back pain with bilateral buttocks and hips and into the front of both knees. The pain is  Worse with bending stooping, standing, lying or sitting too long worsens the pain. No bowel or bladder difficulty. No numbness and Tingling. The pain is a constant aching pain but there are days that are sharp pain with difficulty getting out of bed. She has been  Told she does not qualify for disablility. The pain limits her standing and walking. She has pain with grocery shopping and her husband does the shopping. Son was being born and she had an emergency C-section the Epidural anesthesia was placed and the c-section done. When the anesthesia wore off she had the onset of the back pain and it has been present since then.  She has done PT and walks for exercise depending on the day she doesn't walk much, today is bad. Pain with lifting weight and she has pain sometimes with riding in cars. Pain with low chairs and can't sit on the floor or in low lying chairs.    Review of Systems  Constitutional: Negative for activity change, appetite change, chills, diaphoresis, fatigue, fever and unexpected weight change (30  lbs weight loss last 6 months).  HENT: Negative.  Negative for congestion, dental problem, drooling, ear discharge, ear pain, facial swelling, hearing loss, mouth sores, nosebleeds, postnasal drip, rhinorrhea, sinus pressure, sinus pain, sneezing, sore throat, tinnitus, trouble swallowing and voice change.   Eyes: Negative.  Negative for photophobia, pain, discharge, redness, itching and visual disturbance.  Respiratory: Positive for apnea (mother has epressed concern) and cough. Negative for choking, chest tightness, shortness of breath, wheezing and stridor.   Cardiovascular: Negative.  Negative for chest pain,  palpitations and leg swelling.  Gastrointestinal: Negative.  Negative for abdominal distention, abdominal pain, anal bleeding, blood in stool, constipation, diarrhea, nausea, rectal pain and vomiting.  Endocrine: Negative.  Negative for cold intolerance, heat intolerance, polydipsia, polyphagia and polyuria.  Genitourinary: Negative for difficulty urinating, dyspareunia, dysuria, enuresis, flank pain, frequency, genital sores, hematuria and urgency.  Musculoskeletal: Positive for back pain. Negative for arthralgias, gait problem, joint swelling, myalgias, neck pain and neck stiffness.  Skin: Negative.   Allergic/Immunologic: Positive for food allergies (orange allergy). Negative for environmental allergies and immunocompromised state.  Neurological: Negative.  Negative for dizziness, tremors, seizures, syncope, facial asymmetry, speech difficulty, weakness, light-headedness, numbness and headaches.  Hematological: Negative.  Negative for adenopathy. Does not bruise/bleed easily.  Psychiatric/Behavioral: Negative for agitation, behavioral problems, confusion, decreased concentration, dysphoric mood, hallucinations, self-injury, sleep disturbance and suicidal ideas. The patient is hyperactive. The patient is not nervous/anxious.      Objective: Vital Signs: BP 115/81 (BP Location: Left Arm, Patient Position: Sitting)   Pulse 97   Ht 5\' 6"  (1.676 m)   Wt 201 lb (91.2 kg)   BMI 32.44 kg/m   Physical Exam Constitutional:      Appearance: She is well-developed.  HENT:     Head: Normocephalic and atraumatic.  Eyes:     Pupils: Pupils are equal, round, and reactive to light.  Pulmonary:     Effort: Pulmonary effort is normal.     Breath sounds: Normal breath sounds.  Abdominal:     General: Bowel sounds are normal.     Palpations: Abdomen is soft.  Musculoskeletal:        General: Normal range of motion.     Cervical back: Normal range of motion and neck supple.  Skin:    General: Skin  is warm and dry.  Neurological:     Mental Status: She is alert and oriented to person, place, and time.  Psychiatric:        Behavior: Behavior normal.        Thought Content: Thought content normal.        Judgment: Judgment normal.     Ortho Exam  Specialty Comments:  No specialty comments available.  Imaging: No results found.   PMFS History: Patient Active Problem List   Diagnosis Date Noted  . Overdose of antipsychotic 01/23/2017  . Hypoxia 01/17/2017  . Bipolar I disorder, most recent episode depressed ([place]) 06/11/2013  . Depression with suicidal ideation 06/09/2013  . Alcohol abuse, episodic drinking behavior 06/09/2013  . Major depressive disorder, recurrent episode, severe, specified as with psychotic behavior 06/09/2013  . 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 SNORING 07/18/2009  . OVARIAN CYST 06/13/2009  . HYPERCHOLESTEROLEMIA 09/19/2008  . BACK PAIN 09/18/2008  . TOBACCO ABUSE 08/13/2008  . INSOMNIA 08/13/2008   Past Medical History:  Diagnosis Date  . Anxiety   . Back pain   . CAP (community acquired pneumonia)   . Chronic pain  syndrome   . Depression   . Gallbladder problem   . Obsessive compulsive disorder     Family History  Problem Relation Age of Onset  . Diabetes insipidus Father   . Arthritis Father   . Heart failure Father   . COPD Father   . Emphysema Father   . Asthma Son     Past Surgical History:  Procedure Laterality Date  . CESAREAN SECTION    . CHOLECYSTECTOMY  2009  . wisdom teeth extraction     Social History   Occupational History  . Not on file  Tobacco Use  . Smoking status: Current Every Day Smoker    Packs/day: 0.50    Years: 23.00    Pack years: 11.50    Types: Cigarettes  . Smokeless tobacco: Never Used  Substance and Sexual Activity  . Alcohol use: Yes    Comment: unsure amount (1/6) - states generally does not drink  . Drug use: No  . Sexual  activity: Yes    Birth control/protection: None

## 2019-10-26 NOTE — Telephone Encounter (Signed)
Phone call to patient to verify medication list and allergies for myelogram procedure. Pt instructed to hold abilify, phentermine, zoloft, trazodone for 48hrs prior to myelogram appointment time. Pt verbalized understanding. Pre and post procedure instructions reviewed with pt.

## 2019-10-30 ENCOUNTER — Emergency Department (HOSPITAL_COMMUNITY)
Admission: EM | Admit: 2019-10-30 | Discharge: 2019-10-30 | Discharge status: Against Medical Advice | Payer: Managed Care, Other (non HMO) | Attending: Emergency Medicine | Admitting: Emergency Medicine

## 2019-10-30 ENCOUNTER — Other Ambulatory Visit: Payer: Self-pay

## 2019-10-30 DIAGNOSIS — Z5321 Procedure and treatment not carried out due to patient leaving prior to being seen by health care provider: Secondary | ICD-10-CM | POA: Insufficient documentation

## 2019-10-30 DIAGNOSIS — R739 Hyperglycemia, unspecified: Secondary | ICD-10-CM | POA: Diagnosis present

## 2019-10-30 LAB — CBG MONITORING, ED: Glucose-Capillary: 161 mg/dL — ABNORMAL HIGH (ref 70–99)

## 2019-10-30 NOTE — ED Notes (Signed)
Pt stated she did not feel like she needed to be seen anymore. Triage RN Ilene made aware. Pt ambulated out of ED.

## 2019-11-09 ENCOUNTER — Other Ambulatory Visit: Payer: Self-pay

## 2019-11-09 ENCOUNTER — Ambulatory Visit
Admission: RE | Admit: 2019-11-09 | Discharge: 2019-11-09 | Disposition: A | Discharge status: Home / Self Care | Payer: Managed Care, Other (non HMO) | Source: Ambulatory Visit | Attending: Specialist | Admitting: Specialist

## 2019-11-09 DIAGNOSIS — M4807 Spinal stenosis, lumbosacral region: Secondary | ICD-10-CM

## 2019-11-09 DIAGNOSIS — M545 Low back pain, unspecified: Secondary | ICD-10-CM

## 2019-11-09 DIAGNOSIS — M5116 Intervertebral disc disorders with radiculopathy, lumbar region: Secondary | ICD-10-CM

## 2019-11-09 MED ORDER — MEPERIDINE HCL 50 MG/ML IJ SOLN
50.0000 mg | Freq: Once | INTRAMUSCULAR | Status: AC
  Administered 2019-11-09: 50 mg via INTRAMUSCULAR

## 2019-11-09 MED ORDER — IOPAMIDOL (ISOVUE-M 200) INJECTION 41%
15.0000 mL | Freq: Once | INTRAMUSCULAR | Status: AC
  Administered 2019-11-09: 15 mL via INTRA_ARTICULAR

## 2019-11-09 MED ORDER — ONDANSETRON HCL 4 MG/2ML IJ SOLN
4.0000 mg | Freq: Once | INTRAMUSCULAR | Status: AC
  Administered 2019-11-09: 4 mg via INTRAMUSCULAR

## 2019-11-09 MED ORDER — DIAZEPAM 5 MG PO TABS
10.0000 mg | ORAL_TABLET | Freq: Once | ORAL | Status: AC
  Administered 2019-11-09: 10:00:00 10 mg via ORAL

## 2019-11-09 NOTE — Progress Notes (Signed)
Pt reports she has been off of her Abilify, Phentermine, Zoloft, and Trazodone for at least 48 hours. Pt verbalizes understanding that she can safely restart these medications tomorrow 11/10/2019 at 1030AM or after.

## 2019-11-09 NOTE — Discharge Instructions (Signed)
Myelogram Discharge Instructions  1. Go home and rest quietly for the next 24 hours.  It is important to lie flat for the next 24 hours.  Get up only to go to the restroom.  You may lie in the bed or on a couch on your back, your stomach, your left side or your right side.  You may have one pillow under your head.  You may have pillows between your knees while you are on your side or under your knees while you are on your back.  2. DO NOT drive today.  Recline the seat as far back as it will go, while still wearing your seat belt, on the way home.  3. You may get up to go to the bathroom as needed.  You may sit up for 10 minutes to eat.  You may resume your normal diet and medications unless otherwise indicated.  Drink lots of extra fluids today and tomorrow.  4. The incidence of headache, nausea, or vomiting is about 5% (one in 20 patients).  If you develop a headache, lie flat and drink plenty of fluids until the headache goes away.  Caffeinated beverages may be helpful.  If you develop severe nausea and vomiting or a headache that does not go away with flat bed rest, call [phone].  5. You may resume normal activities after your 24 hours of bed rest is over; however, do not exert yourself strongly or do any heavy lifting tomorrow. If when you get up you have a headache when standing, go back to bed and force fluids for another 24 hours.  6. Call your physician for a follow-up appointment.  The results of your myelogram will be sent directly to your physician by the following day.  7. If you have any questions or if complications develop after you arrive home, please call [phone].  Discharge instructions have been explained to the patient.  The patient, or the person responsible for the patient, fully understands these instructions.  YOU MAY RESTART YOUR ABILIFY, PHENTERMINE, ZOLOFT AND TRAZODONE TOMORROW 11/10/19 AT 10:30AM.

## 2019-11-09 NOTE — Discharge Instr - Other Orders (Signed)
Pt reports achy, pressure like pain 8/10 due to myelogram procedure. Dr. Karin Golden notified, see MAR.

## 2019-11-22 ENCOUNTER — Ambulatory Visit: Payer: Managed Care, Other (non HMO) | Admitting: Specialist

## 2019-11-22 ENCOUNTER — Encounter: Payer: Self-pay | Admitting: Specialist

## 2019-11-22 ENCOUNTER — Other Ambulatory Visit: Payer: Self-pay

## 2019-11-22 VITALS — BP 114/78 | HR 90 | Ht 66.0 in | Wt 201.0 lb

## 2019-11-22 DIAGNOSIS — M4807 Spinal stenosis, lumbosacral region: Secondary | ICD-10-CM

## 2019-11-22 DIAGNOSIS — M5136 Other intervertebral disc degeneration, lumbar region: Secondary | ICD-10-CM | POA: Diagnosis not present

## 2019-11-22 DIAGNOSIS — M5116 Intervertebral disc disorders with radiculopathy, lumbar region: Secondary | ICD-10-CM | POA: Diagnosis not present

## 2019-11-22 NOTE — Patient Instructions (Signed)
Avoid bending, stooping and avoid lifting weights greater than 10 lbs. Avoid prolong standing and walking. Order for a new walker with wheels. Surgery scheduling secretary Tivis Ringer, will call you in the next week to schedule for surgery.  Surgery recommended is a two level lumbar decompressive partial hemilaminectomies left L3-4 and bilateral  L4-5 with possible microdiscectomy left L4-5 this would be done with a microscope. Take hydrocodone for for pain. Risk of surgery includes risk of infection 1 in 200 patients, bleeding 1/2% chance you would need a transfusion.   Risk to the nerves is one in 10,000. You will need to use a brace for 3 months and wean from the brace on the 4th month. Expect improved walking and standing tolerance. Expect relief of leg pain but numbness may persist depending on the length and degree of pressure that has been present. Unfortunately I would expect you would still have back pain with decompression alone. Fusion is the only surgery that potentially would be more predictable in terms of relieving back pain and that is only to a level of about  60-70 % pain relief.

## 2019-11-22 NOTE — Progress Notes (Signed)
Office Visit Note   Patient: [name]           Date of Birth: Feb 16, 1981           MRN: [id number] Visit Date: 11/22/2019              Requested by: Bing Neighbors, FNP [address] [place],  [place] [address] PCP: System, Pcp Not In   Assessment & Plan: Visit Diagnoses:  1. Degenerative disc disease, lumbar   2. Spinal stenosis of lumbosacral region   3. Lumbar disc herniation with radiculopathy     Plan: Avoid frequent bending and stooping  No lifting greater than 10 lbs. May use ice or moist heat for pain. Weight loss is of benefit. Best medication for lumbar disc disease is arthritis medications like motrin, celebrex and naprosyn. Exercise is important to improve your indurance and does allow people to function better inspite of back pain.  Avoid bending, stooping and avoid lifting weights greater than 10 lbs. Avoid prolong standing and walking. Order for a new walker with wheels. Surgery scheduling secretary Tivis Ringer, will call you in the next week to schedule for surgery.  Surgery recommended is a two level lumbar decompressive partial hemilaminectomies left L3-4 and bilateral  L4-5 with possible microdiscectomy left L4-5 this would be done with a microscope. Take hydrocodone for for pain. Risk of surgery includes risk of infection 1 in 200 patients, bleeding 1/2% chance you would need a transfusion.   Risk to the nerves is one in 10,000. You will need to use a brace for 3 months and wean from the brace on the 4th month. Expect improved walking and standing tolerance. Expect relief of leg pain but numbness may persist depending on the length and degree of pressure that has been present. Unfortunately I would expect you would still have back pain with decompression alone. Fusion is the only surgery that potentially would be more predictable in terms of relieving back pain and that is only to a level of about  60-70 % pain relief.   Follow-Up  Instructions: No follow-ups on file.   Orders:  No orders of the defined types were placed in this encounter.  No orders of the defined types were placed in this encounter.     Procedures: No procedures performed   Clinical Data: No additional findings.   Subjective: Chief Complaint  Patient presents with  . Lower Back - Follow-up    Here for CT Myelogram review of Lumbar spine    39 year old female with history of back pain and radiation into the legs. She is not able to walk much further than through the grocery store and that is it painful and prevents her from continuing to walk due pain in the buttocks and thighs. She is in pain continuously. Has had ESIs by Dr. Regino Schultze without relief and now is at a point where she is unable to tolerate the discomfort. Myelogram and post myelogram have been done and she is here to go over the studies and discuss options concerning her back.   Review of Systems  Constitutional: Negative for activity change, appetite change, chills, diaphoresis, fatigue, fever and unexpected weight change.  HENT: Negative.  Negative for congestion, dental problem, drooling, ear discharge, ear pain, facial swelling, hearing loss, mouth sores, nosebleeds, postnasal drip, rhinorrhea, sinus pressure, sinus pain, sneezing, sore throat, tinnitus, trouble swallowing and voice change.   Eyes: Negative.  Negative for photophobia, pain, discharge, redness,  itching and visual disturbance.  Respiratory: Negative for apnea, cough, choking, chest tightness, shortness of breath, wheezing and stridor.   Cardiovascular: Negative.   Gastrointestinal: Negative for abdominal distention, abdominal pain, anal bleeding, blood in stool, constipation, diarrhea, nausea, rectal pain and vomiting.  Endocrine: Negative.  Negative for cold intolerance, heat intolerance, polydipsia, polyphagia and polyuria.  Genitourinary: Negative.  Negative for decreased urine volume, difficulty urinating,  dyspareunia, dysuria, enuresis, flank pain, frequency, genital sores, hematuria, menstrual problem, pelvic pain, urgency, vaginal bleeding and vaginal discharge.  Musculoskeletal: Positive for back pain and gait problem. Negative for arthralgias, joint swelling, myalgias, neck pain and neck stiffness.  Skin: Negative.  Negative for color change, pallor, rash and wound.  Allergic/Immunologic: Negative.  Negative for environmental allergies, food allergies and immunocompromised state.  Neurological: Negative for dizziness, tremors, seizures, syncope, facial asymmetry, speech difficulty, weakness, light-headedness, numbness and headaches.  Hematological: Negative for adenopathy. Does not bruise/bleed easily.  Psychiatric/Behavioral: Negative for agitation, behavioral problems, confusion, decreased concentration, dysphoric mood, hallucinations, self-injury, sleep disturbance and suicidal ideas. The patient is not nervous/anxious and is not hyperactive.      Objective: Vital Signs: BP 114/78 (BP Location: Left Arm, Patient Position: Sitting)   Pulse 90   Ht 5\' 6"  (1.676 m)   Wt 201 lb (91.2 kg)   BMI 32.44 kg/m   Physical Exam  Ortho Exam  Specialty Comments:  No specialty comments available.  Imaging: No results found.   PMFS History: Patient Active Problem List   Diagnosis Date Noted  . Obesity (BMI 30.0-34.9) 04/11/2019  . Neuropathy 04/11/2019  . Hyperlipidemia 04/11/2019  . 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  . Overdose of antipsychotic 01/23/2017  . Hypoxia 01/17/2017  . Diabetes 1.5, managed as type 2 ([place]) 01/17/2017  . Bipolar I disorder, most recent episode depressed ([place]) 06/11/2013  . Depression with suicidal ideation 06/09/2013  . Alcohol abuse, episodic drinking behavior 06/09/2013  . Major depressive disorder, recurrent episode, severe, specified as with psychotic behavior 06/09/2013  . 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 SNORING 07/18/2009  . OVARIAN CYST 06/13/2009  . HYPERCHOLESTEROLEMIA 09/19/2008  . BACK PAIN 09/18/2008  . TOBACCO ABUSE 08/13/2008  . INSOMNIA 08/13/2008   Past Medical History:  Diagnosis Date  . Anxiety   . Back pain   . CAP (community acquired pneumonia)   . Chronic pain syndrome   . Depression   . Gallbladder problem   . Obsessive compulsive disorder     Family History  Problem Relation Age of Onset  . Diabetes insipidus Father   . Arthritis Father   . Heart failure Father   . COPD Father   . Emphysema Father   . Asthma Son     Past Surgical History:  Procedure Laterality Date  . CESAREAN SECTION    . CHOLECYSTECTOMY  2009  . wisdom teeth extraction     Social History   Occupational History  . Not on file  Tobacco Use  . Smoking status: Current Every Day Smoker    Packs/day: 0.50    Years: 23.00    Pack years: 11.50    Types: Cigarettes  . Smokeless tobacco: Never Used  Substance and Sexual Activity  . Alcohol use: Yes    Comment: unsure amount (1/6) - states generally does not drink  . Drug use: No  . Sexual activity: Yes    Birth control/protection: None

## 2019-12-14 ENCOUNTER — Telehealth: Payer: Self-pay | Admitting: Specialist

## 2019-12-14 ENCOUNTER — Encounter: Payer: Self-pay | Admitting: Specialist

## 2019-12-14 NOTE — Telephone Encounter (Signed)
Records faxed to [facility] [phone], attn:Jamelle Lucretia Field

## 2019-12-14 NOTE — Telephone Encounter (Signed)
Please see message below, I am not sure this is something we can do or not, as we don't control her pain medication at this point

## 2019-12-14 NOTE — Telephone Encounter (Signed)
Patient called requesting Dr. Otelia Sergeant call pain management in regards to patient dosage being changed. Patient phone number is [phone].

## 2019-12-20 ENCOUNTER — Telehealth: Payer: Self-pay | Admitting: Specialist

## 2019-12-20 ENCOUNTER — Ambulatory Visit: Payer: Managed Care, Other (non HMO) | Admitting: Specialist

## 2019-12-20 NOTE — Telephone Encounter (Signed)
Patient called.  She submitted a message via MyChart but hasn't heard back yet. She needs his permission to up her pain medication sent to her pain management doctor.   Patient call back: [phone]

## 2019-12-20 NOTE — Telephone Encounter (Signed)
Per Otelia Sergeant, she needs to talk to them about increasing her pain meds and as we are not controlling her meds since we have not done surgery on her.

## 2019-12-20 NOTE — Telephone Encounter (Signed)
I called and spoke with patient advised that she wold have to discuss it with pain management she states that they were worried about pain management for after surgery I advised that she should try to remain on the lowest possible dose till she has surgery

## 2019-12-21 ENCOUNTER — Telehealth: Payer: Self-pay | Admitting: Specialist

## 2019-12-21 NOTE — Telephone Encounter (Signed)
Patient called again.   She is still adamant on her request for Nitka to advocate an increase in her dosage for her pain medicine to her pain management doctor.   Call back: [phone]

## 2019-12-21 NOTE — Telephone Encounter (Signed)
Holding for you.  

## 2019-12-24 ENCOUNTER — Other Ambulatory Visit: Payer: Self-pay

## 2019-12-24 ENCOUNTER — Encounter: Payer: Self-pay | Admitting: Specialist

## 2019-12-24 ENCOUNTER — Ambulatory Visit (INDEPENDENT_AMBULATORY_CARE_PROVIDER_SITE_OTHER): Payer: Managed Care, Other (non HMO) | Admitting: Specialist

## 2019-12-24 VITALS — BP 116/83 | HR 83 | Ht 66.0 in | Wt 204.0 lb

## 2019-12-24 DIAGNOSIS — M51369 Other intervertebral disc degeneration, lumbar region without mention of lumbar back pain or lower extremity pain: Secondary | ICD-10-CM

## 2019-12-24 DIAGNOSIS — M5116 Intervertebral disc disorders with radiculopathy, lumbar region: Secondary | ICD-10-CM

## 2019-12-24 DIAGNOSIS — M5136 Other intervertebral disc degeneration, lumbar region: Secondary | ICD-10-CM

## 2019-12-24 DIAGNOSIS — M48062 Spinal stenosis, lumbar region with neurogenic claudication: Secondary | ICD-10-CM

## 2019-12-24 DIAGNOSIS — M533 Sacrococcygeal disorders, not elsewhere classified: Secondary | ICD-10-CM | POA: Diagnosis not present

## 2019-12-24 DIAGNOSIS — M4807 Spinal stenosis, lumbosacral region: Secondary | ICD-10-CM

## 2019-12-24 NOTE — Progress Notes (Addendum)
Office Visit Note   Patient: [name]           Date of Birth: 22-Mar-1981           MRN: [id number] Visit Date: 12/24/2019              Requested by: No referring provider defined for this encounter. PCP: System, Pcp Not In   Assessment & Plan: Visit Diagnoses:  1. Degenerative disc disease, lumbar   2. Spinal stenosis of lumbosacral region   3. Lumbar disc herniation with radiculopathy     Plan: Avoid bending, stooping and avoid lifting weights greater than 10 lbs. Avoid prolong standing and walking. Order for a new walker with wheels. Surgery scheduling secretary Tivis Ringer, will call you in the next week to schedule for surgery.  Surgery recommended is a three level lumbar fusion L3-4, L4-5 and L5-S1 this would be done with rods, screws and cages with local bone graft and allograft (donor bone graft). Take oxycodone for for pain. Note to Dr. Lucretia Field indicating that I believe increase in her narcotics is appropriate until surgery can proceed. Risk of surgery includes risk of infection 1 in 100 patients, bleeding 3-5% chance you would need a transfusion.   Risk to the nerves is one in 10,000. You will need to use a brace for 3 months and wean from the brace on the 4th month. Expect improved walking and standing tolerance. Expect relief of leg pain but numbness may persist depending on the length and degree of pressure that has been present.  Follow-Up Instructions: Return in about 4 weeks (around 01/21/2020).   Orders:  No orders of the defined types were placed in this encounter.  No orders of the defined types were placed in this encounter.     Procedures: No procedures performed   Clinical Data: No additional findings.   Subjective: Chief Complaint  Patient presents with  . Lower Back - Pain    39 year old female with greater than 10 year history of back pain greater than leg pain with radiation into the buttocks and thighs.She  reports Pain is an "8" of 10 and sometime as bad as a "10". She has tried numerous courses of physical therapy, NSAIDs and epidural injections without relief. She is seeing Dr. Lucretia Field at [facility] and is taking Oxycodone 10 mg every 8 hours for pain. She takes tranzadone at night for a sleep disorder. She has pain that is worsened with bending, stooping, lifting, twisting and riding in cars. Pain is Such that she has discussed the situation with her husband and she would prefer to go ahead with surgery in the form of lumbar fusion  Surgery as opposed to lumbar decompression alone. I have reviewed her chart and agree than a lumbar fusion is a more predictable way of Decreasing her pain.We had discussion today concerning the risks and benefits of fusion surgery vs decompression alone. She reports most of her pain is in her lower back and with that I believe fusion surgery is a reasonable solution to her condition but has risks related to Her diabetes and hypertension and swell as expected increase chance for need for further fusin surgeries in the future. I have explained That fusion surgery may only relief 60-70 percent of the pain and that the likelihood of needing more surgery is about 40%        Review of Systems  Constitutional: Positive for unexpected weight change. Negative for activity change,  appetite change, chills, diaphoresis, fatigue and fever.  HENT: Negative.  Negative for congestion, dental problem, drooling, ear discharge, ear pain, facial swelling, hearing loss, mouth sores, nosebleeds, postnasal drip, rhinorrhea, sinus pressure, sinus pain, sneezing, sore throat, tinnitus, trouble swallowing and voice change.   Eyes: Negative.  Negative for photophobia, pain, discharge, redness, itching and visual disturbance.  Respiratory: Negative.   Cardiovascular: Negative.   Gastrointestinal: Negative.   Endocrine: Negative.   Genitourinary: Negative.  Negative for  difficulty urinating, dyspareunia, dysuria, enuresis, flank pain, frequency, genital sores, hematuria and urgency.  Musculoskeletal: Positive for back pain. Negative for arthralgias, gait problem, joint swelling, myalgias, neck pain and neck stiffness.  Skin: Negative.  Negative for color change, pallor, rash and wound.  Allergic/Immunologic: Negative.  Negative for environmental allergies, food allergies and immunocompromised state.  Neurological: Negative.  Negative for dizziness, tremors, seizures, syncope, facial asymmetry, speech difficulty, weakness, light-headedness, numbness and headaches.  Hematological: Negative.  Negative for adenopathy. Does not bruise/bleed easily.  Psychiatric/Behavioral: Negative.  Negative for agitation, behavioral problems, confusion, decreased concentration, dysphoric mood, hallucinations, self-injury, sleep disturbance and suicidal ideas. The patient is not nervous/anxious and is not hyperactive.      Objective: Vital Signs: BP 116/83 (BP Location: Left Arm, Patient Position: Sitting)   Pulse 83   Ht 5\' 6"  (1.676 m)   Wt 204 lb (92.5 kg)   BMI 32.93 kg/m   Physical Exam Constitutional:      Appearance: She is well-developed.  HENT:     Head: Normocephalic and atraumatic.  Eyes:     Pupils: Pupils are equal, round, and reactive to light.  Pulmonary:     Effort: Pulmonary effort is normal.     Breath sounds: Normal breath sounds.  Abdominal:     General: Bowel sounds are normal.     Palpations: Abdomen is soft.  Musculoskeletal:     Cervical back: Normal range of motion and neck supple.     Lumbar back: Negative right straight leg raise test and negative left straight leg raise test.  Skin:    General: Skin is warm and dry.  Neurological:     Mental Status: She is alert and oriented to person, place, and time.  Psychiatric:        Behavior: Behavior normal.        Thought Content: Thought content normal.        Judgment: Judgment normal.      Back Exam   Range of Motion  Extension: normal  Flexion:  60 abnormal  Lateral bend right:  60 abnormal  Lateral bend left:  60 abnormal  Rotation right: 60  Rotation left: 60   Muscle Strength  Right Quadriceps:  5/5  Left Quadriceps:  5/5  Right Hamstrings:  5/5  Left Hamstrings:  5/5   Tests  Straight leg raise right: negative Straight leg raise left: negative  Reflexes  Patellar: 2/4 Achilles: 2/4 Babinski's sign: normal   Other  Toe walk: normal Heel walk: normal Sensation: normal      Specialty Comments:  No specialty comments available.  Imaging: No results found.   PMFS History: Patient Active Problem List   Diagnosis Date Noted  . Obesity (BMI 30.0-34.9) 04/11/2019  . Neuropathy 04/11/2019  . Hyperlipidemia 04/11/2019  . 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  . Overdose of antipsychotic 01/23/2017  . Hypoxia 01/17/2017  . 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 Bipolar I disorder, most recent episode depressed (HCC) 06/11/2013  .  Depression with suicidal ideation 06/09/2013  . Alcohol abuse, episodic drinking behavior 06/09/2013  . Major depressive disorder, recurrent episode, severe, specified as with psychotic behavior 06/09/2013  . 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 SNORING 07/18/2009  . OVARIAN CYST 06/13/2009  . HYPERCHOLESTEROLEMIA 09/19/2008  . BACK PAIN 09/18/2008  . TOBACCO ABUSE 08/13/2008  . INSOMNIA 08/13/2008   Past Medical History:  Diagnosis Date  . Anxiety   . Back pain   . CAP (community acquired pneumonia)   . Chronic pain syndrome   . Depression   . Gallbladder problem   . Obsessive compulsive disorder     Family History  Problem Relation Age of Onset  . Diabetes insipidus Father   . Arthritis Father   . Heart failure Father   . COPD Father   . Emphysema Father   . Asthma Son     Past Surgical History:   Procedure Laterality Date  . CESAREAN SECTION    . CHOLECYSTECTOMY  2009  . wisdom teeth extraction     Social History   Occupational History  . Not on file  Tobacco Use  . Smoking status: Current Every Day Smoker    Packs/day: 0.50    Years: 23.00    Pack years: 11.50    Types: Cigarettes  . Smokeless tobacco: Never Used  Substance and Sexual Activity  . Alcohol use: Yes    Comment: unsure amount (1/6) - states generally does not drink  . Drug use: No  . Sexual activity: Yes    Birth control/protection: None

## 2019-12-24 NOTE — Telephone Encounter (Signed)
This will be addressed at her appt today

## 2019-12-24 NOTE — Patient Instructions (Signed)
Avoid bending, stooping and avoid lifting weights greater than 10 lbs. Avoid prolong standing and walking. Order for a new walker with wheels. Surgery scheduling secretary Tivis Ringer, will call you in the next week to schedule for surgery.  Surgery recommended is a three level lumbar fusion L3-4, L4-5 and L5-S1 this would be done with rods, screws and cages with local bone graft and allograft (donor bone graft). Take hydrocodone for for pain. Risk of surgery includes risk of infection 1 in 100 patients, bleeding 3-5% chance you would need a transfusion.   Risk to the nerves is one in 10,000. You will need to use a brace for 3 months and wean from the brace on the 4th month. Expect improved walking and standing tolerance. Expect relief of leg pain but numbness may persist depending on the length and degree of pressure that has been present.

## 2020-02-14 ENCOUNTER — Ambulatory Visit: Payer: Managed Care, Other (non HMO) | Admitting: Specialist

## 2020-02-14 ENCOUNTER — Encounter: Payer: Self-pay | Admitting: Specialist

## 2020-02-14 ENCOUNTER — Other Ambulatory Visit: Payer: Self-pay

## 2020-02-14 VITALS — BP 97/66 | HR 94 | Ht 66.0 in | Wt 204.0 lb

## 2020-02-14 DIAGNOSIS — M5116 Intervertebral disc disorders with radiculopathy, lumbar region: Secondary | ICD-10-CM | POA: Diagnosis not present

## 2020-02-14 DIAGNOSIS — M48062 Spinal stenosis, lumbar region with neurogenic claudication: Secondary | ICD-10-CM

## 2020-02-14 DIAGNOSIS — M545 Low back pain, unspecified: Secondary | ICD-10-CM

## 2020-02-14 DIAGNOSIS — M533 Sacrococcygeal disorders, not elsewhere classified: Secondary | ICD-10-CM | POA: Diagnosis not present

## 2020-02-14 DIAGNOSIS — M5136 Other intervertebral disc degeneration, lumbar region: Secondary | ICD-10-CM

## 2020-02-14 MED ORDER — GABAPENTIN 100 MG PO CAPS: 100.0000 mg | ORAL_CAPSULE | Freq: Every day | ORAL | 3 refills | Status: AC

## 2020-02-14 MED ORDER — GABAPENTIN 300 MG PO CAPS: 300.0000 mg | ORAL_CAPSULE | Freq: Every day | ORAL | 3 refills | Status: AC

## 2020-02-14 MED ORDER — METFORMIN HCL 1000 MG PO TABS: 500.0000 mg | ORAL_TABLET | Freq: Two times a day (BID) | ORAL | 3 refills | Status: DC

## 2020-02-14 NOTE — Patient Instructions (Signed)
Plan: Avoid bending, stooping and avoid lifting weights greater than 10 lbs. Avoid prolong standing and walking. Order for a new walker with wheels. Surgery scheduling secretary Tivis Ringer, will call you in the next week to schedule for surgery.  Surgery recommended is a three level lumbar fusion L3-4, L4-5 and L5-S1 this would be done with rods, screws and cages with local bone graft and allograft (donor bone graft). Take oxycodone for for pain. Note to Dr. Lucretia Field indicating that I believe increase in her narcotics is appropriate until surgery can proceed. Risk of surgery includes risk of infection 1 in 100 patients, bleeding 3-5% chance you would need a transfusion.   Risk to the nerves is one in 10,000. You will need to use a brace for 3 months and wean from the brace on the 4th month. Expect improved walking and standing tolerance. Expect relief of leg pain but numbness may persist depending on the length and degree of pressure that has been present.

## 2020-02-14 NOTE — Progress Notes (Signed)
Office Visit Note   Patient: [name]           Date of Birth: 1980-11-07           MRN: [id number] Visit Date: 02/14/2020              Requested by: No referring provider defined for this encounter. PCP: System, Pcp Not In   Assessment & Plan: Visit Diagnoses:  1. Degenerative disc disease, lumbar   2. Sacroiliac joint disease   3. Lumbar disc herniation with radiculopathy   4. Low back pain, unspecified back pain laterality, unspecified chronicity, unspecified whether sciatica present   5. Spinal stenosis of lumbar region with neurogenic claudication     Plan: Avoid bending, stooping and avoid lifting weights greater than 10 lbs. Avoid prolong standing and walking. Order for a new walker with wheels. Surgery scheduling secretary Tivis Ringer, will call you in the next week to schedule for surgery.  Surgery recommended is a three level lumbar fusion L3-4, L4-5 and L5-S1 this would be done with rods, screws and cages with local bone graft and allograft (donor bone graft). Take oxycodone for for pain. Note to Dr. Lucretia Field indicating that I believe increase in her narcotics is appropriate until surgery can proceed. Risk of surgery includes risk of infection 1 in 100 patients, bleeding 3-5% chance you would need a transfusion.   Risk to the nerves is one in 10,000. You will need to use a brace for 3 months and wean from the brace on the 4th month. Expect improved walking and standing tolerance. Expect relief of leg pain but numbness may persist depending on the length and degree of pressure that has been present.   Follow-Up Instructions: No follow-ups on file.   Orders:  No orders of the defined types were placed in this encounter.  No orders of the defined types were placed in this encounter.     Procedures: No procedures performed   Clinical Data: No additional findings.   Subjective: Chief Complaint  Patient presents with  . Lower Back -  Follow-up    66 yeaar old female with history of lumbar pain with radiation into the left buttock. No left leg pain or numbness. She has feelings of movement of an area of the left low back, an area of " loose bone " it feels like it is trying to push its way through. She has pain with movement, twisting, forward bending not as bad as backwards bending. Mopping and sweeping are painful. She is not working since about 5 month pregnant with premature labour about 12 years ago. Pain is now about "3-4" On a scale of 1-10. Without meds it is an "9".  She is able to sleep with or without meds. The pain is there constantly, less painful in AM and then worsens as the day goes on. She is scheduled to  Three level lumbar fusion on 03/25/2020.    Review of Systems  Constitutional: Positive for activity change. Negative for appetite change, chills, diaphoresis, fatigue, fever and unexpected weight change.  HENT: Negative.   Eyes: Negative.   Respiratory: Negative.  Negative for apnea, cough, choking, chest tightness, shortness of breath, wheezing and stridor.   Cardiovascular: Negative.  Negative for chest pain, palpitations and leg swelling.  Gastrointestinal: Negative.  Negative for abdominal distention, abdominal pain, anal bleeding, blood in stool, constipation, diarrhea, nausea, rectal pain and vomiting.  Endocrine: Negative.   Genitourinary: Negative.   Musculoskeletal: Positive for  back pain. Negative for gait problem, joint swelling, myalgias, neck pain and neck stiffness.  Skin: Negative.  Negative for color change, pallor, rash and wound.  Allergic/Immunologic: Negative.   Neurological: Negative.  Negative for dizziness, tremors, seizures, syncope, facial asymmetry, speech difficulty, weakness, light-headedness, numbness and headaches.  Hematological: Negative.  Negative for adenopathy. Does not bruise/bleed easily.  Psychiatric/Behavioral: Negative for agitation, behavioral problems, confusion,  decreased concentration, dysphoric mood, hallucinations, self-injury, sleep disturbance and suicidal ideas. The patient is not nervous/anxious and is not hyperactive.      Objective: Vital Signs: BP 97/66 (BP Location: Left Arm, Patient Position: Sitting)   Pulse 94   Ht 5\' 6"  (1.676 m)   Wt 204 lb (92.5 kg)   BMI 32.93 kg/m   Physical Exam Constitutional:      Appearance: She is well-developed.  HENT:     Head: Normocephalic and atraumatic.  Eyes:     Pupils: Pupils are equal, round, and reactive to light.  Pulmonary:     Effort: Pulmonary effort is normal.     Breath sounds: Normal breath sounds.  Abdominal:     General: Bowel sounds are normal.     Palpations: Abdomen is soft.  Musculoskeletal:     Cervical back: Normal range of motion and neck supple.     Lumbar back: Negative right straight leg raise test and negative left straight leg raise test.  Skin:    General: Skin is warm and dry.  Neurological:     Mental Status: She is alert and oriented to person, place, and time.  Psychiatric:        Behavior: Behavior normal.        Thought Content: Thought content normal.        Judgment: Judgment normal.     Back Exam   Tenderness  The patient is experiencing tenderness in the lumbar.  Range of Motion  Extension: abnormal  Flexion: abnormal  Lateral bend right: abnormal  Lateral bend left: abnormal  Rotation right: abnormal   Muscle Strength  Right Quadriceps:  5/5  Left Quadriceps:  5/5  Right Hamstrings:  5/5  Left Hamstrings:  5/5   Tests  Straight leg raise right: negative Straight leg raise left: negative  Reflexes  Patellar: 1/4 Achilles: 1/4 Biceps: 1/4 Babinski's sign: normal   Other  Toe walk: normal Heel walk: normal Sensation: normal Erythema: no back redness Scars: absent      Specialty Comments:  No specialty comments available.  Imaging: No results found.   PMFS History: Patient Active Problem List   Diagnosis Date  Noted  . Obesity (BMI 30.0-34.9) 04/11/2019  . Neuropathy 04/11/2019  . Hyperlipidemia 04/11/2019  . 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  . Overdose of antipsychotic 01/23/2017  . Hypoxia 01/17/2017  . Diabetes 1.5, managed as type 2 ([place]) 01/17/2017  . Bipolar I disorder, most recent episode depressed ([place]) 06/11/2013  . Depression with suicidal ideation 06/09/2013  . Alcohol abuse, episodic drinking behavior 06/09/2013  . Major depressive disorder, recurrent episode, severe, specified as with psychotic behavior 06/09/2013  . 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 SNORING 07/18/2009  . OVARIAN CYST 06/13/2009  . HYPERCHOLESTEROLEMIA 09/19/2008  . BACK PAIN 09/18/2008  . TOBACCO ABUSE 08/13/2008  . INSOMNIA 08/13/2008   Past Medical History:  Diagnosis Date  . Anxiety   . Back pain   . CAP (community acquired pneumonia)   . Chronic pain syndrome   . Depression   .  Gallbladder problem   . Obsessive compulsive disorder     Family History  Problem Relation Age of Onset  . Diabetes insipidus Father   . Arthritis Father   . Heart failure Father   . COPD Father   . Emphysema Father   . Asthma Son     Past Surgical History:  Procedure Laterality Date  . CESAREAN SECTION    . CHOLECYSTECTOMY  2009  . wisdom teeth extraction     Social History   Occupational History  . Not on file  Tobacco Use  . Smoking status: Current Every Day Smoker    Packs/day: 0.50    Years: 23.00    Pack years: 11.50    Types: Cigarettes  . Smokeless tobacco: Never Used  Substance and Sexual Activity  . Alcohol use: Yes    Comment: unsure amount (1/6) - states generally does not drink  . Drug use: No  . Sexual activity: Yes    Birth control/protection: None

## 2020-03-13 ENCOUNTER — Ambulatory Visit: Payer: Managed Care, Other (non HMO) | Admitting: Specialist

## 2020-03-13 ENCOUNTER — Ambulatory Visit: Payer: Managed Care, Other (non HMO) | Admitting: Surgery

## 2020-03-13 ENCOUNTER — Encounter: Payer: Self-pay | Admitting: Specialist

## 2020-03-13 ENCOUNTER — Other Ambulatory Visit: Payer: Self-pay

## 2020-03-13 VITALS — BP 111/75 | HR 91 | Ht 66.0 in | Wt 204.0 lb

## 2020-03-13 DIAGNOSIS — M48062 Spinal stenosis, lumbar region with neurogenic claudication: Secondary | ICD-10-CM | POA: Diagnosis not present

## 2020-03-13 DIAGNOSIS — M5116 Intervertebral disc disorders with radiculopathy, lumbar region: Secondary | ICD-10-CM

## 2020-03-13 DIAGNOSIS — G894 Chronic pain syndrome: Secondary | ICD-10-CM

## 2020-03-13 DIAGNOSIS — M5136 Other intervertebral disc degeneration, lumbar region: Secondary | ICD-10-CM | POA: Diagnosis not present

## 2020-03-13 DIAGNOSIS — M545 Low back pain, unspecified: Secondary | ICD-10-CM

## 2020-03-13 NOTE — Patient Instructions (Addendum)
Plan: Avoid bending, stooping and avoid lifting weights greater than 10 lbs. Avoid prolong standing and walking. Order for a new walker with wheels. Surgery scheduling secretary Tivis Ringer, will call you in the next week to schedule for surgery.  Surgery recommended is a three level lumbar fusion L3-4, L4-5 and L5-S1 this would be done with rods, screws and cages with local bone graft and allograft (donor bone graft). Take oxycodone for for pain. Note to Dr. Lucretia Field indicating that I believe increase in her narcotics is appropriate until surgery can proceed. Risk of surgery includes risk of infection 1 in 100 patients, bleeding 3-5% chance you would need a transfusion.   Risk to the nerves is one in 10,000. You will need to use a brace for 3 months and wean from the brace on the 4th month. Expect improved walking and standing tolerance. Expect relief of leg pain but numbness may persist depending on the length and degree of pressure that has been present. Patient request a note to her pain management, Dr. Lucretia Field for increasing her narcotic medications and removing tylenol to prevent  Liver sideeffects. I believe that she has pain, I am not a pain management specialist but I believe she has a tolerance of narcotics and Increasing her meds is appropriate. We are planning to intervene to decrease her mechanical pain and hopefully be decreasing her use of narcotics for pain control.    She report that she has completed PT recently and based on this she has completed conservative management. Will revisit obtaining authorization from her insurance company for 3 level lumbar fusion, if unable to obtain then have her seen for a  Second opinion. Has had a psychological eval and reports being told there was no psychological concern, this was prior to her starting Treatment by Dr. Lucretia Field.  Follow-Up Instructions: Return in about 4 weeks (around 01/21/2020).

## 2020-03-13 NOTE — Progress Notes (Signed)
Office Visit Note   Patient: [name]           Date of Birth: 05/09/1981           MRN: [id number] Visit Date: 03/13/2020              Requested by: No referring provider defined for this encounter. PCP: System, Pcp Not In   Assessment & Plan: Visit Diagnoses:  1. Degenerative disc disease, lumbar   2. Lumbar disc herniation with radiculopathy   3. Low back pain, unspecified back pain laterality, unspecified chronicity, unspecified whether sciatica present   4. Spinal stenosis of lumbar region with neurogenic claudication   5. Chronic pain syndrome     Plan: Plan: Avoid bending, stooping and avoid lifting weights greater than 10 lbs. Avoid prolong standing and walking. Order for a new walker with wheels. Surgery scheduling secretary Tivis Ringer, will call you in the next week to schedule for surgery.  Surgery recommended is a three level lumbar fusion L3-4, L4-5 and L5-S1 this would be done with rods, screws and cages with local bone graft and allograft (donor bone graft). Take oxycodone for for pain. Note to Dr. Lucretia Field indicating that I believe increase in her narcotics is appropriate until surgery can proceed. Risk of surgery includes risk of infection 1 in 100 patients, bleeding 3-5% chance you would need a transfusion.   Risk to the nerves is one in 10,000. You will need to use a brace for 3 months and wean from the brace on the 4th month. Expect improved walking and standing tolerance. Expect relief of leg pain but numbness may persist depending on the length and degree of pressure that has been present. Patient request a note to her pain management, Dr. Lucretia Field for increasing her narcotic medications and removing tylenol to prevent  Liver sideeffects. I believe that she has pain, I am not a pain management specialist but I believe she has a tolerance of narcotics and Increasing her meds is appropriate. We are planning to intervene to decrease her  mechanical pain and hopefully be decreasing her use of narcotics for pain control.  She report that she has completed PT recently and based on this she has completed conservative management. Will revisit obtaining authorization from her insurance company for 3 level lumbar fusion, if unable to obtain then have her seen for a  Second opinion. Has had a psychological eval and reports being told there was no psychological concern, this was prior to her starting Treatment by Dr. Lucretia Field Follow-Up Instructions: Return in about 4 weeks (around 01/21/2020).   Orders:  No orders of the defined types were placed in this encounter.  No orders of the defined types were placed in this encounter.   Follow-Up Instructions: No follow-ups on file.   Orders:  No orders of the defined types were placed in this encounter.  No orders of the defined types were placed in this encounter.     Procedures: No procedures performed   Clinical Data: Findings:  CLINICAL DATA:  Low back and bilateral leg pain. Lower lumbar degenerative disc disease by MRI.  EXAM: LUMBAR MYELOGRAM  FLUOROSCOPY TIME:  0 minutes 41 seconds. 201.90 micro gray meter squared  PROCEDURE: After thorough discussion of risks and benefits of the procedure including bleeding, infection, injury to nerves, blood vessels, adjacent structures as well as headache and CSF leak, written and oral informed consent was obtained. Consent was obtained by Dr. Paulina Fusi. Time out form  was completed.  Patient was positioned prone on the fluoroscopy table. Local anesthesia was provided with 1% lidocaine without epinephrine after prepped and draped in the usual sterile fashion. Puncture was performed at L3-4 using a 5 inch 22-gauge spinal needle via left paramedian approach. Using a single pass through the dura, the needle was placed within the thecal sac, with return of clear CSF. 15 mL of Isovue M-200 was injected into the thecal  sac, with normal opacification of the nerve roots and cauda equina consistent with free flow within the subarachnoid space.  I personally performed the lumbar puncture and administered the intrathecal contrast. I also personally performed acquisition of the myelogram images.  TECHNIQUE: Contiguous axial images were obtained through the Lumbar spine after the intrathecal infusion of infusion. Coronal and sagittal reconstructions were obtained of the axial image sets.  COMPARISON:  MRI 06/15/2019  FINDINGS: LUMBAR MYELOGRAM FINDINGS:  Small anterior extradural defects at L3-4 and L4-5. flexion extension views do not show any subluxation. No distinct neural compression. Lateral recess narrowing left more than right at L4-5.  CT LUMBAR MYELOGRAM FINDINGS:  No abnormality at L2-3 or above.  L3-4: Annular bulging slightly more towards the left. Slight indentation of the thecal sac but no apparent neural compressive stenosis.  L4-5: Annular bulging more towards the left. Definite neural compression is not established, but there is some potential to affect the L5 nerves in the lateral recesses, particularly the left.  L5-S1: Mild bulging of the disc.  No neural compressive stenosis.  No significant facet arthropathy. No pars defect. Patient does have bilateral sacroiliac osteoarthritis.  IMPRESSION: L3-4: Annular bulging slightly more prominent towards the left. No apparent compressive stenosis.  L4-5: Annular bulging more prominent towards the left. Mild narrowing of the lateral recesses left more than right. Definite neural compression is not established, but there would be some potential for neural irritation, particularly on the left.  L5-S1: Disc bulge.  No compressive stenosis.  Bilateral sacroiliac osteoarthritis.   Electronically Signed   By: Paulina Fusi M.D.   On: 11/09/2019 12:08    CLINICAL DATA:  Low back and bilateral leg pain. Lower  lumbar degenerative disc disease by MRI.  EXAM: LUMBAR MYELOGRAM  FLUOROSCOPY TIME:  0 minutes 41 seconds. 201.90 micro gray meter squared  PROCEDURE: After thorough discussion of risks and benefits of the procedure including bleeding, infection, injury to nerves, blood vessels, adjacent structures as well as headache and CSF leak, written and oral informed consent was obtained. Consent was obtained by Dr. Paulina Fusi. Time out form was completed.  Patient was positioned prone on the fluoroscopy table. Local anesthesia was provided with 1% lidocaine without epinephrine after prepped and draped in the usual sterile fashion. Puncture was performed at L3-4 using a 5 inch 22-gauge spinal needle via left paramedian approach. Using a single pass through the dura, the needle was placed within the thecal sac, with return of clear CSF. 15 mL of Isovue M-200 was injected into the thecal sac, with normal opacification of the nerve roots and cauda equina consistent with free flow within the subarachnoid space.  I personally performed the lumbar puncture and administered the intrathecal contrast. I also personally performed acquisition of the myelogram images.  TECHNIQUE: Contiguous axial images were obtained through the Lumbar spine after the intrathecal infusion of infusion. Coronal and sagittal reconstructions were obtained of the axial image sets.  COMPARISON:  MRI 06/15/2019  FINDINGS: LUMBAR MYELOGRAM FINDINGS:  Small anterior extradural defects at L3-4 and L4-5.  flexion extension views do not show any subluxation. No distinct neural compression. Lateral recess narrowing left more than right at L4-5.  CT LUMBAR MYELOGRAM FINDINGS:  No abnormality at L2-3 or above.  L3-4: Annular bulging slightly more towards the left. Slight indentation of the thecal sac but no apparent neural compressive stenosis.  L4-5: Annular bulging more towards the left. Definite  neural compression is not established, but there is some potential to affect the L5 nerves in the lateral recesses, particularly the left.  L5-S1: Mild bulging of the disc.  No neural compressive stenosis.  No significant facet arthropathy. No pars defect. Patient does have bilateral sacroiliac osteoarthritis.  IMPRESSION: L3-4: Annular bulging slightly more prominent towards the left. No apparent compressive stenosis.  L4-5: Annular bulging more prominent towards the left. Mild narrowing of the lateral recesses left more than right. Definite neural compression is not established, but there would be some potential for neural irritation, particularly on the left.  L5-S1: Disc bulge.  No compressive stenosis.  Bilateral sacroiliac osteoarthritis.   Electronically Signed   By: Nelson Chimes M.D.   On: 11/09/2019 12:08     Subjective: Chief Complaint  Patient presents with  . Lower Back - Follow-up    39 year old female with long history of back pain greater than leg pain. The pain is central to the small of the back, worsens with bending stooping and lifting. She has been taking and receiving oxycodone for nearly 18 months. She has been unable to work for almost 6 years. She was unable to stand for greater than 8 hours in tears at the end of the day. She has studies showing DDD of the lumbar spine and  Has been through PT the last therapy with breakthrough in the last one year. She rates her pain as a "30" of 10. Taking oxycodone 10 mg 4 times per day. No bowel or bladder difficulty. Take 200 mg of trazadone at night and is able to sleep. She is doing home chores, but recently  She and her husband recently hired a maid, due to inabiltiy to bend, dust lower shelves, clean lower areas of house, mopping and vacuuming increases the pain. She has exhausted concervative management. We requested approval of lumbar fusion L3-4, L4-5 and L5-S1. This apparently was denied. Smoking  1/2 ppd.   Review of Systems  Constitutional: Positive for activity change and unexpected weight change. Negative for appetite change, chills, diaphoresis, fatigue and fever.  HENT: Negative.  Negative for congestion, dental problem, drooling, ear discharge, ear pain, facial swelling, hearing loss, mouth sores, nosebleeds, postnasal drip, rhinorrhea, sinus pressure, sinus pain, sneezing, sore throat, tinnitus, trouble swallowing and voice change.   Eyes: Negative.   Respiratory: Negative.  Negative for apnea, cough, choking, chest tightness, shortness of breath, wheezing and stridor.   Cardiovascular: Negative.  Negative for chest pain, palpitations and leg swelling.  Gastrointestinal: Negative.  Negative for abdominal distention, abdominal pain, anal bleeding, blood in stool, constipation, diarrhea, nausea, rectal pain and vomiting.  Endocrine: Negative.   Genitourinary: Negative.   Musculoskeletal: Positive for back pain and gait problem. Negative for arthralgias, joint swelling, myalgias, neck pain and neck stiffness.  Skin: Negative.  Negative for color change, pallor, rash and wound.  Allergic/Immunologic: Negative.  Negative for environmental allergies and food allergies.  Neurological: Negative for dizziness, tremors, seizures, syncope, facial asymmetry, speech difficulty, weakness, light-headedness, numbness and headaches.  Hematological: Negative.  Negative for adenopathy. Does not bruise/bleed easily.  Psychiatric/Behavioral: Negative.  Negative for agitation, behavioral problems, confusion, decreased concentration, dysphoric mood, hallucinations, self-injury, sleep disturbance and suicidal ideas. The patient is not nervous/anxious and is not hyperactive.      Objective: Vital Signs: BP 111/75 (BP Location: Left Arm, Patient Position: Sitting)   Pulse 91   Ht 5\' 6"  (1.676 m)   Wt 204 lb (92.5 kg)   BMI 32.93 kg/m   Physical Exam  Ortho Exam  Specialty Comments:  No  specialty comments available.  Imaging: No results found.   PMFS History: Patient Active Problem List   Diagnosis Date Noted  . Obesity (BMI 30.0-34.9) 04/11/2019  . Neuropathy 04/11/2019  . Hyperlipidemia 04/11/2019  . 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  . Overdose of antipsychotic 01/23/2017  . Hypoxia 01/17/2017  . 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 Bipolar I disorder, most recent episode depressed ([facility]) 06/11/2013  . Depression with suicidal ideation 06/09/2013  . Alcohol abuse, episodic drinking behavior 06/09/2013  . Major depressive disorder, recurrent episode, severe, specified as with psychotic behavior 06/09/2013  . 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 SNORING 07/18/2009  . OVARIAN CYST 06/13/2009  . HYPERCHOLESTEROLEMIA 09/19/2008  . BACK PAIN 09/18/2008  . TOBACCO ABUSE 08/13/2008  . INSOMNIA 08/13/2008   Past Medical History:  Diagnosis Date  . Anxiety   . Back pain   . CAP (community acquired pneumonia)   . Chronic pain syndrome   . Depression   . Gallbladder problem   . Obsessive compulsive disorder     Family History  Problem Relation Age of Onset  . Diabetes insipidus Father   . Arthritis Father   . Heart failure Father   . COPD Father   . Emphysema Father   . Asthma Son     Past Surgical History:  Procedure Laterality Date  . CESAREAN SECTION    . CHOLECYSTECTOMY  2009  . wisdom teeth extraction     Social History   Occupational History  . Not on file  Tobacco Use  . Smoking status: Current Every Day Smoker    Packs/day: 0.50    Years: 23.00    Pack years: 11.50    Types: Cigarettes  . Smokeless tobacco: Never Used  Substance and Sexual Activity  . Alcohol use: Yes    Comment: unsure amount (1/6) - states generally does not drink  . Drug use: No  . Sexual activity: Yes    Birth control/protection: None

## 2020-03-26 ENCOUNTER — Ambulatory Visit: Payer: Managed Care, Other (non HMO) | Admitting: Orthopaedic Surgery

## 2020-03-26 ENCOUNTER — Other Ambulatory Visit: Payer: Self-pay

## 2020-03-26 ENCOUNTER — Encounter: Payer: Self-pay | Admitting: Orthopaedic Surgery

## 2020-03-26 DIAGNOSIS — M5126 Other intervertebral disc displacement, lumbar region: Secondary | ICD-10-CM

## 2020-03-27 ENCOUNTER — Ambulatory Visit (INDEPENDENT_AMBULATORY_CARE_PROVIDER_SITE_OTHER): Payer: Managed Care, Other (non HMO) | Admitting: Surgery

## 2020-03-27 ENCOUNTER — Encounter: Payer: Self-pay | Admitting: Surgery

## 2020-03-27 VITALS — BP 115/80 | HR 105 | Ht 66.0 in | Wt 200.0 lb

## 2020-03-27 DIAGNOSIS — Z5329 Procedure and treatment not carried out because of patient's decision for other reasons: Secondary | ICD-10-CM

## 2020-03-31 DIAGNOSIS — M5126 Other intervertebral disc displacement, lumbar region: Secondary | ICD-10-CM | POA: Insufficient documentation

## 2020-03-31 NOTE — Progress Notes (Signed)
Office Visit Note   Patient: [name]           Date of Birth: [birth year]           MRN: [id number] Visit Date: 03/26/2020              Requested by: No referring provider defined for this encounter. PCP: System, Pcp Not In   Assessment & Plan: Visit Diagnoses:  1. Protrusion of lumbar intervertebral disc     Plan: Patient been treated with anti-inflammatories epidural injections multiple courses of physical therapy.  She has had facet injections, Neurontin, muscle relaxants.  Patient is on chronic narcotic medication managed at [facility].  She has some disc protrusions at L3-4, L4-5 and L5-S1.  We reviewed myelogram CT scan today as well as MRI scan.  We discussed some problems controlling postoperative pain with the amount of narcotics that she is taking but she is on less than 50mg  morphine equivalent daily at this point.  3 level lumbar fusion is recommended.  She understands that there is a chance that she may have continued ongoing pain and may require additional surgery at some point in future.  Follow-Up Instructions: No follow-ups on file.   Orders:  No orders of the defined types were placed in this encounter.  No orders of the defined types were placed in this encounter.     Procedures: No procedures performed   Clinical Data: No additional findings.   Subjective: Chief Complaint  Patient presents with  . Lower Back - Pain    2nd opinion    HPI 39 year old female with chronic back pain with annular bulging L3-4, L4-5 with lateral recess narrowing at L4-5 and disc bulge at L5-S1.  Patient's been followed for greater than 9 months by Dr. 24 with persistent back pain symptoms that radiates into both hips slightly worse on the left than right.  Patient's been through a complete course of physical therapy.  She sees Dr. Otelia Sergeant at [facility] for pain management and has been cleared psychologically and referred for surgical  intervention with Dr.Nitka.  Patient said previous multiple epidurals.  Patient rates her pain at 7-9 out of 10.  She complains of increased pain when she tries to walk turn or twist.  She states she has pain every day .  Review of Systems ulcer history alcohol abuse ADHD bipolar disorder type I.  Type 2 diabetes hypercholesterolemia.  Patient is on chronic oxycodone 15 mg 3 times daily.  Patient denies suicide ideations.  Otherwise unchanged from 12/24/2019 office visit.   Objective: Vital Signs: BP 102/71   Pulse 97   Ht 5\' 6"  (1.676 m)   Wt 200 lb (90.7 kg)   BMI 32.28 kg/m   Physical Exam Constitutional:      Appearance: She is well-developed.  HENT:     Head: Normocephalic.     Right Ear: External ear normal.     Left Ear: External ear normal.  Eyes:     Pupils: Pupils are equal, round, and reactive to light.  Neck:     Thyroid: No thyromegaly.     Trachea: No tracheal deviation.  Cardiovascular:     Rate and Rhythm: Normal rate.  Pulmonary:     Effort: Pulmonary effort is normal.  Abdominal:     Palpations: Abdomen is soft.  Skin:    General: Skin is warm and dry.  Neurological:     Mental Status: She is alert and  oriented to person, place, and time.  Psychiatric:        Behavior: Behavior normal.     Ortho Exam patient has increased pain with forward flexion fingertips to mid tibial level.  Abnormal lateral bending right and left.  Knee and ankle jerk are intact.  She is able to heel and toe walk.  Pedal pulses are intact no plantar foot lesions.  Specialty Comments:  No specialty comments available.  Imaging:CLINICAL DATA:  Low back pain with bilateral radiculopathy  EXAM: MRI LUMBAR SPINE WITHOUT CONTRAST  TECHNIQUE: Multiplanar, multisequence MR imaging of the lumbar spine was performed. No intravenous contrast was administered.  COMPARISON:  X-ray 01/31/2009  FINDINGS: Segmentation:  Standard.  Alignment:  Physiologic.  Vertebrae:  No  fracture, evidence of discitis, or bone lesion.  Conus medullaris and cauda equina: Conus extends to the L1 level. Conus and cauda equina appear normal.  Paraspinal and other soft tissues: Negative.  Disc levels:  T12-L1: No significant disc protrusion, foraminal stenosis, or canal stenosis.  L1-L2: No significant disc protrusion, foraminal stenosis, or canal stenosis.  L2-L3: No significant disc protrusion, foraminal stenosis, or canal stenosis.  L3-L4: Small central disc protrusion with posterior annular fissure results in mild mass effect on the ventral thecal sac without stenosis. Minimal bilateral facet arthrosis. No foraminal stenosis.  L4-L5: Small central disc protrusion with posterior annular fissure results in mass effect on the ventral thecal sac and mild narrowing of the left greater than right subarticular recesses without canal stenosis. Mild bilateral facet arthropathy without foraminal stenosis.  L5-S1: Right paracentral disc protrusion results in narrowing of the right subarticular recess without canal stenosis. Mild bilateral facet arthrosis without foraminal stenosis.  IMPRESSION: Degenerative disc disease of the lower lumbar spine including a right paracentral disc protrusion at L5-S1 narrowing the right subarticular recess. Correlate for radicular symptoms in the right S1 distribution.   Electronically Signed   By: Duanne Guess M.D.   On: 06/15/2019 13:41  CLINICAL DATA:  Low back and bilateral leg pain. Lower lumbar degenerative disc disease by MRI.  EXAM: LUMBAR MYELOGRAM  FLUOROSCOPY TIME:  0 minutes 41 seconds. 201.90 micro gray meter squared  PROCEDURE: After thorough discussion of risks and benefits of the procedure including bleeding, infection, injury to nerves, blood vessels, adjacent structures as well as headache and CSF leak, written and oral informed consent was obtained. Consent was obtained by Dr.  Paulina Fusi. Time out form was completed.  Patient was positioned prone on the fluoroscopy table. Local anesthesia was provided with 1% lidocaine without epinephrine after prepped and draped in the usual sterile fashion. Puncture was performed at L3-4 using a 5 inch 22-gauge spinal needle via left paramedian approach. Using a single pass through the dura, the needle was placed within the thecal sac, with return of clear CSF. 15 mL of Isovue M-200 was injected into the thecal sac, with normal opacification of the nerve roots and cauda equina consistent with free flow within the subarachnoid space.  I personally performed the lumbar puncture and administered the intrathecal contrast. I also personally performed acquisition of the myelogram images.  TECHNIQUE: Contiguous axial images were obtained through the Lumbar spine after the intrathecal infusion of infusion. Coronal and sagittal reconstructions were obtained of the axial image sets.  COMPARISON:  MRI 06/15/2019  FINDINGS: LUMBAR MYELOGRAM FINDINGS:  Small anterior extradural defects at L3-4 and L4-5. flexion extension views do not show any subluxation. No distinct neural compression. Lateral recess narrowing left more than right  at L4-5.  CT LUMBAR MYELOGRAM FINDINGS:  No abnormality at L2-3 or above.  L3-4: Annular bulging slightly more towards the left. Slight indentation of the thecal sac but no apparent neural compressive stenosis.  L4-5: Annular bulging more towards the left. Definite neural compression is not established, but there is some potential to affect the L5 nerves in the lateral recesses, particularly the left.  L5-S1: Mild bulging of the disc.  No neural compressive stenosis.  No significant facet arthropathy. No pars defect. Patient does have bilateral sacroiliac osteoarthritis.  IMPRESSION: L3-4: Annular bulging slightly more prominent towards the left. No apparent compressive  stenosis.  L4-5: Annular bulging more prominent towards the left. Mild narrowing of the lateral recesses left more than right. Definite neural compression is not established, but there would be some potential for neural irritation, particularly on the left.  L5-S1: Disc bulge.  No compressive stenosis.  Bilateral sacroiliac osteoarthritis.   Electronically Signed   By: Nelson Chimes M.D.   On: 11/09/2019 12:08   PMFS History: Patient Active Problem List   Diagnosis Date Noted  . Protrusion of lumbar intervertebral disc 03/31/2020  . Obesity (BMI 30.0-34.9) 04/11/2019  . Neuropathy 04/11/2019  . Hyperlipidemia 04/11/2019  . 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  . Overdose of antipsychotic 01/23/2017  . Hypoxia 01/17/2017  . 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 Bipolar I disorder, most recent episode depressed ([place]) 06/11/2013  . Depression with suicidal ideation 06/09/2013  . Alcohol abuse, episodic drinking behavior 06/09/2013  . Major depressive disorder, recurrent episode, severe, specified as with psychotic behavior 06/09/2013  . 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 SNORING 07/18/2009  . OVARIAN CYST 06/13/2009  . HYPERCHOLESTEROLEMIA 09/19/2008  . BACK PAIN 09/18/2008  . TOBACCO ABUSE 08/13/2008  . INSOMNIA 08/13/2008   Past Medical History:  Diagnosis Date  . Anxiety   . Back pain   . CAP (community acquired pneumonia)   . Chronic pain syndrome   . Depression   . Gallbladder problem   . Obsessive compulsive disorder     Family History  Problem Relation Age of Onset  . Diabetes insipidus Father   . Arthritis Father   . Heart failure Father   . COPD Father   . Emphysema Father   . Asthma Son     Past Surgical History:  Procedure Laterality Date  . CESAREAN SECTION    . CHOLECYSTECTOMY  2009  . wisdom teeth extraction     Social History    Occupational History  . Not on file  Tobacco Use  . Smoking status: Current Every Day Smoker    Packs/day: 0.50    Years: 23.00    Pack years: 11.50    Types: Cigarettes  . Smokeless tobacco: Never Used  Substance and Sexual Activity  . Alcohol use: Yes    Comment: unsure amount (1/6) - states generally does not drink  . Drug use: No  . Sexual activity: Yes    Birth control/protection: None

## 2020-04-30 ENCOUNTER — Ambulatory Visit: Payer: Managed Care, Other (non HMO) | Admitting: Specialist

## 2020-05-07 ENCOUNTER — Encounter: Payer: Self-pay | Admitting: Specialist

## 2020-05-07 ENCOUNTER — Other Ambulatory Visit: Payer: Self-pay

## 2020-05-07 ENCOUNTER — Ambulatory Visit: Payer: Managed Care, Other (non HMO) | Admitting: Specialist

## 2020-05-07 VITALS — BP 104/72 | HR 91 | Ht 66.0 in | Wt 198.0 lb

## 2020-05-07 DIAGNOSIS — M533 Sacrococcygeal disorders, not elsewhere classified: Secondary | ICD-10-CM

## 2020-05-07 DIAGNOSIS — M5116 Intervertebral disc disorders with radiculopathy, lumbar region: Secondary | ICD-10-CM

## 2020-05-07 DIAGNOSIS — M5136 Other intervertebral disc degeneration, lumbar region: Secondary | ICD-10-CM

## 2020-05-07 DIAGNOSIS — G894 Chronic pain syndrome: Secondary | ICD-10-CM

## 2020-05-07 DIAGNOSIS — M5126 Other intervertebral disc displacement, lumbar region: Secondary | ICD-10-CM

## 2020-05-07 DIAGNOSIS — M4807 Spinal stenosis, lumbosacral region: Secondary | ICD-10-CM | POA: Diagnosis not present

## 2020-05-07 NOTE — Progress Notes (Signed)
Office Visit Note   Patient: [name]           Date of Birth: September 18, 1981           MRN: [id number] Visit Date: 05/07/2020              Requested by: No referring provider defined for this encounter. PCP: System, Pcp Not In   Assessment & Plan: Visit Diagnoses:  1. Degenerative disc disease, lumbar   2. Lumbar disc herniation with radiculopathy   3. Sacroiliac joint disease   4. Spinal stenosis of lumbosacral region   5. Chronic pain syndrome   6. Protrusion of lumbar intervertebral disc     Plan:Avoid bending, stooping and avoid lifting weights greater than 10 lbs. Avoid prolong standing and walking. Order for a new walker with wheels. Surgery scheduling secretary Tivis Ringer, will call you in the next week to schedule for surgery.  Surgery recommended is a three level lumbar fusion L3-4, L4-5 and L5-S1 this would be done with rods, screws and cages with local bone graft and allograft (donor bone graft). Takeoxycodone for for pain.Note to Dr. Lucretia Field indicating that I believe increase in her narcotics is appropriate until surgery can proceed. Risk of surgery includes risk of infection 1 in 100 patients, bleeding 3-5% chance you would need a transfusion. Risk to the nerves is one in 10,000. You will need to use a brace for 3 months and wean from the brace on the 4th month. Expect improved walking and standing tolerance. Expect relief of leg pain but numbness may persist depending on the length and degree of pressure that has been present. Patient request a note to her pain management, Dr. Lucretia Field for increasing her narcotic medications and removing tylenol to prevent  Liver sideeffects. I believe that she has pain, I am not a pain management specialist but I believe she has a tolerance of narcotics and Increasing her meds is appropriate. We are planning to intervene to decrease her mechanical pain and hopefully be decreasing her use of narcotics for pain  control.  She report that she has completed PT recently and based on this she has completed conservative management. Will revisit obtaining authorization from her insurance company for 3 level lumbar fusion, if unable to obtain then have her seen for a  Second opinion. Has had a psychological eval and reports being told there was no psychological concern, this was prior to her starting Treatment by Dr. Lucretia Field Follow-Up Instructions:Return in about 4 weeks (around 01/21/2020).  Orders:  Follow-Up Instructions: No follow-ups on file.   Orders:  No orders of the defined types were placed in this encounter.  No orders of the defined types were placed in this encounter.     Procedures: No procedures performed   Clinical Data: No additional findings.   Subjective: Chief Complaint  Patient presents with  . Lower Back - Follow-up    38 year old female with long history of lumbago and leg pain. Findings of mild lateral recess stenosis with DDD L3-4, L4-5 and L5-S1. Her pain is primarily Central to the back and buttocks. She was last seen about 2 months ago and at that time I recommended 3 level lumbar fusion. She is taking oxycodone  chronicly and remains in a pain management program with [facility]. She has been evaluated by her psychiatrist and with primary care and Cleared for surgery. Thus far surgery has not been scheduled. I will have our surgical scheduler contact  her today. No bowel or bladder difficulty. She is currently Unemployed for about 6 years. Has been to PT numerous times. 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 Would work and go home and to bed with pain. Pain is a 10 when she was working. Now The pain is about a 4 with narcotic medication, Oxycodone 15 mg every 6 hours as need.   Review of Systems  Constitutional:  Negative.  Negative for activity change, appetite change, chills, diaphoresis, fatigue, fever and unexpected weight change.  HENT: Negative.  Negative for congestion, dental problem, drooling, ear discharge, ear pain, facial swelling, hearing loss, mouth sores, nosebleeds, postnasal drip, rhinorrhea, sinus pressure, sinus pain, sneezing, sore throat, tinnitus, trouble swallowing and voice change.   Eyes: Negative.  Negative for photophobia, pain, discharge, redness, itching and visual disturbance.  Respiratory: Negative.  Negative for apnea, cough, choking, chest tightness, shortness of breath, wheezing and stridor.   Cardiovascular: Negative.  Negative for chest pain, palpitations and leg swelling.  Gastrointestinal: Negative.  Negative for abdominal distention, abdominal pain, anal bleeding, blood in stool, constipation, diarrhea, nausea and rectal pain.  Endocrine: Negative for cold intolerance, heat intolerance, polydipsia, polyphagia and polyuria.  Genitourinary: Negative for difficulty urinating, dyspareunia, dysuria, enuresis, flank pain, frequency and genital sores.  Musculoskeletal: Positive for back pain. Negative for arthralgias, gait problem, joint swelling, myalgias, neck pain and neck stiffness.  Skin: Negative.  Negative for color change, pallor, rash and wound.  Allergic/Immunologic: Negative.  Negative for environmental allergies, food allergies and immunocompromised state.  Neurological: Negative.  Negative for dizziness, tremors, seizures, syncope, facial asymmetry, speech difficulty, weakness, light-headedness, numbness and headaches.  Hematological: Negative.  Negative for adenopathy. Does not bruise/bleed easily.  Psychiatric/Behavioral: Negative.  Negative for agitation, behavioral problems, confusion, decreased concentration, dysphoric mood, hallucinations, self-injury, sleep disturbance and suicidal ideas. The patient is not nervous/anxious and is not hyperactive.       Objective: Vital Signs: BP 104/72 (BP Location: Left Arm, Patient Position: Sitting)   Pulse 91   Ht 5\' 6"  (1.676 m)   Wt 198 lb (89.8 kg)   BMI 31.96 kg/m   Physical Exam Constitutional:      Appearance: She is well-developed.  HENT:     Head: Normocephalic and atraumatic.  Eyes:     Pupils: Pupils are equal, round, and reactive to light.  Pulmonary:     Effort: Pulmonary effort is normal.     Breath sounds: Normal breath sounds.  Abdominal:     General: Bowel sounds are normal.     Palpations: Abdomen is soft.  Musculoskeletal:     Cervical back: Normal range of motion and neck supple.     Lumbar back: Negative right straight leg raise test and negative left straight leg raise test.  Skin:    General: Skin is warm and dry.  Neurological:     Mental Status: She is alert and oriented to person, place, and time.  Psychiatric:        Behavior: Behavior normal.        Thought Content: Thought content normal.        Judgment: Judgment normal.     Back Exam   Range of Motion  Extension: abnormal  Flexion: abnormal  Lateral bend right: abnormal  Rotation right: abnormal   Muscle Strength  Right Quadriceps:  5/5  Left Quadriceps:  5/5  Right Hamstrings:  5/5  Left Hamstrings:  5/5   Tests  Straight leg raise right: negative Straight leg raise left: negative  Reflexes  Patellar: 2/4 Achilles: 2/4 Babinski's sign: normal   Other  Toe walk: normal Heel walk: normal Sensation: normal Gait: normal  Erythema: no back redness Scars: absent      Specialty Comments:  No specialty comments available.  Imaging: No results found.   PMFS History: Patient Active Problem List   Diagnosis Date Noted  . Protrusion of lumbar intervertebral disc 03/31/2020  . Obesity (BMI 30.0-34.9) 04/11/2019  . Neuropathy 04/11/2019  . Hyperlipidemia 04/11/2019  . 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  . Overdose of antipsychotic  01/23/2017  . Hypoxia 01/17/2017  . 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 Bipolar I disorder, most recent episode depressed (HCC) 06/11/2013  . Depression with suicidal ideation 06/09/2013  . Alcohol abuse, episodic drinking behavior 06/09/2013  . Major depressive disorder, recurrent episode, severe, specified as with psychotic behavior 06/09/2013  . 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 SNORING 07/18/2009  . OVARIAN CYST 06/13/2009  . HYPERCHOLESTEROLEMIA 09/19/2008  . BACK PAIN 09/18/2008  . TOBACCO ABUSE 08/13/2008  . INSOMNIA 08/13/2008   Past Medical History:  Diagnosis Date  . Anxiety   . Back pain   . CAP (community acquired pneumonia)   . Chronic pain syndrome   . Depression   . Gallbladder problem   . Obsessive compulsive disorder     Family History  Problem Relation Age of Onset  . Diabetes insipidus Father   . Arthritis Father   . Heart failure Father   . COPD Father   . Emphysema Father   . Asthma Son     Past Surgical History:  Procedure Laterality Date  . CESAREAN SECTION    . CHOLECYSTECTOMY  2009  . wisdom teeth extraction     Social History   Occupational History  . Not on file  Tobacco Use  . Smoking status: Current Every Day Smoker    Packs/day: 0.50    Years: 23.00    Pack years: 11.50    Types: Cigarettes  . Smokeless tobacco: Never Used  Substance and Sexual Activity  . Alcohol use: Yes    Comment: unsure amount (1/6) - states generally does not drink  . Drug use: No  . Sexual activity: Yes    Birth control/protection: None

## 2020-05-07 NOTE — Patient Instructions (Signed)
Plan:Avoid bending, stooping and avoid lifting weights greater than 10 lbs. Avoid prolong standing and walking. Order for a new walker with wheels. Surgery scheduling secretary Tivis Ringer, will call you in the next week to schedule for surgery.  Surgery recommended is a three level lumbar fusion L3-4, L4-5 and L5-S1 this would be done with rods, screws and cages with local bone graft and allograft (donor bone graft). Takeoxycodone for for pain.Note to Dr. Lucretia Field indicating that I believe increase in her narcotics is appropriate until surgery can proceed. Risk of surgery includes risk of infection 1 in 100 patients, bleeding 3-5% chance you would need a transfusion. Risk to the nerves is one in 10,000. You will need to use a brace for 3 months and wean from the brace on the 4th month. Expect improved walking and standing tolerance. Expect relief of leg pain but numbness may persist depending on the length and degree of pressure that has been present. Patient request a note to her pain management, Dr. Lucretia Field for increasing her narcotic medications and removing tylenol to prevent  Liver sideeffects. I believe that she has pain, I am not a pain management specialist but I believe she has a tolerance of narcotics and Increasing her meds is appropriate. We are planning to intervene to decrease her mechanical pain and hopefully be decreasing her use of narcotics for pain control.  She report that she has completed PT recently and based on this she has completed conservative management. Will revisit obtaining authorization from her insurance company for 3 level lumbar fusion, if unable to obtain then have her seen for a  Second opinion. Has had a psychological eval and reports being told there was no psychological concern, this was prior to her starting Treatment by Dr. Lucretia Field Follow-Up Instructions:Return in about 4 weeks (around 01/21/2020).  Orders:

## 2020-06-05 ENCOUNTER — Ambulatory Visit: Payer: Managed Care, Other (non HMO) | Admitting: Specialist

## 2020-08-07 ENCOUNTER — Ambulatory Visit: Payer: Managed Care, Other (non HMO) | Admitting: Specialist

## 2020-09-10 ENCOUNTER — Encounter: Payer: Self-pay | Admitting: Specialist

## 2020-09-10 ENCOUNTER — Ambulatory Visit: Payer: Managed Care, Other (non HMO) | Admitting: Specialist

## 2020-09-10 ENCOUNTER — Other Ambulatory Visit: Payer: Self-pay

## 2020-09-10 ENCOUNTER — Ambulatory Visit (INDEPENDENT_AMBULATORY_CARE_PROVIDER_SITE_OTHER): Payer: Managed Care, Other (non HMO)

## 2020-09-10 VITALS — BP 105/73 | HR 97 | Ht 66.0 in | Wt 189.8 lb

## 2020-09-10 DIAGNOSIS — G894 Chronic pain syndrome: Secondary | ICD-10-CM | POA: Diagnosis not present

## 2020-09-10 DIAGNOSIS — M5136 Other intervertebral disc degeneration, lumbar region: Secondary | ICD-10-CM | POA: Diagnosis not present

## 2020-09-10 DIAGNOSIS — M533 Sacrococcygeal disorders, not elsewhere classified: Secondary | ICD-10-CM

## 2020-09-10 DIAGNOSIS — M5116 Intervertebral disc disorders with radiculopathy, lumbar region: Secondary | ICD-10-CM

## 2020-09-10 DIAGNOSIS — M5126 Other intervertebral disc displacement, lumbar region: Secondary | ICD-10-CM

## 2020-09-10 MED ORDER — DIAZEPAM 5 MG PO TABS: ORAL_TABLET | ORAL | 0 refills | Status: DC

## 2020-09-10 NOTE — Progress Notes (Signed)
Office Visit Note   Patient: [name]           Date of Birth: [birth year]           MRN: [id number] Visit Date: 09/10/2020              Requested by: No referring provider defined for this encounter. PCP: Pcp, No   Assessment & Plan: Visit Diagnoses:  1. Degenerative disc disease, lumbar   2. Sacroiliac joint disease   3. Lumbar disc herniation with radiculopathy   4. Chronic pain syndrome   5. Protrusion of lumbar intervertebral disc     Plan: Repeat MRI as it is nearly one year since last study, I expect there may be more degenerative changes, assess the disc protrusions at each level L3-4, L4-5 and L5-S1. Avoid bending, stooping and avoid lifting weights greater than 10 lbs. Avoid prolong standing and walking. Order for a new walker with wheels. Surgery scheduling secretary Tivis Ringer, will call you in the next week to schedule for surgery.  Surgery recommended is a three level lumbar fusion L3-4, L4-5 and L5-S1 this would be done with rods, screws and cages with local bone graft and allograft (donor bone graft). Takeoxycodone for for pain.Note to Dr. Lucretia Field indicating that I believe increase in her narcotics is appropriate until surgery can proceed. Risk of surgery includes risk of infection 1 in 100 patients, bleeding 3-5% chance you would need a transfusion. Risk to the nerves is one in 10,000. You will need to use a brace for 3 months and wean from the brace on the 4th month. Expect improved walking and standing tolerance. Expect relief of leg pain but numbness may persist depending on the length and degree of pressure that has been present. Patient request a note to her pain management, Dr. Lucretia Field for increasing her narcotic medications and removing tylenol to prevent Liver sideeffects. I believe that she has pain, I am not a pain management specialist but I believe she has a tolerance of narcotics and Increasing her meds is appropriate. We  are planning to intervene to decrease her mechanical pain and hopefully be decreasing her use of narcotics for pain control.  She report that she has completed PT recently and based on this she has completed conservative management. Will revisit obtaining authorization from her insurance company for 3 level lumbar fusion, if unable to obtain then have her seen for a  Second opinion. Has had a psychological eval and reports being told there was no psychological concern, this was prior to her starting Treatment by Dr. Lucretia Field Follow-Up Instructions:Return in about 4 weeks (around 01/21/2020).  Follow-Up Instructions: No follow-ups on file.   Orders:  Orders Placed This Encounter  Procedures  . XR Lumbar Spine 2-3 Views   No orders of the defined types were placed in this encounter.     Procedures: No procedures performed   Clinical Data: Findings:  CLINICAL DATA: Low back and bilateral leg pain. Lower lumbar degenerative disc disease by MRI.  EXAM: LUMBAR MYELOGRAM  FLUOROSCOPY TIME: 0 minutes 41 seconds. 201.90 micro gray meter squared  PROCEDURE: After thorough discussion of risks and benefits of the procedure including bleeding, infection, injury to nerves, blood vessels, adjacent structures as well as headache and CSF leak, written and oral informed consent was obtained. Consent was obtained by Dr. Paulina Fusi. Time out form was completed.  Patient was positioned prone on the fluoroscopy table. Local anesthesia was provided with 1% lidocaine without  epinephrine after prepped and draped in the usual sterile fashion. Puncture was performed at L3-4 using a 5 inch 22-gauge spinal needle via left paramedian approach. Using a single pass through the dura, the needle was placed within the thecal sac, with return of clear CSF. 15 mL of Isovue M-200 was injected into the thecal sac, with normal opacification of the nerve roots and cauda equina consistent with free flow  within the subarachnoid space.  I personally performed the lumbar puncture and administered the intrathecal contrast. I also personally performed acquisition of the myelogram images.  TECHNIQUE: Contiguous axial images were obtained through the Lumbar spine after the intrathecal infusion of infusion. Coronal and sagittal reconstructions were obtained of the axial image sets.  COMPARISON: MRI 06/15/2019  FINDINGS: LUMBAR MYELOGRAM FINDINGS:  Small anterior extradural defects at L3-4 and L4-5. flexion extension views do not show any subluxation. No distinct neural compression. Lateral recess narrowing left more than right at L4-5.  CT LUMBAR MYELOGRAM FINDINGS:  No abnormality at L2-3 or above.  L3-4: Annular bulging slightly more towards the left. Slight indentation of the thecal sac but no apparent neural compressive stenosis.  L4-5: Annular bulging more towards the left. Definite neural compression is not established, but there is some potential to affect the L5 nerves in the lateral recesses, particularly the left.  L5-S1: Mild bulging of the disc. No neural compressive stenosis.  No significant facet arthropathy. No pars defect. Patient does have bilateral sacroiliac osteoarthritis.  IMPRESSION: L3-4: Annular bulging slightly more prominent towards the left. No apparent compressive stenosis.  L4-5: Annular bulging more prominent towards the left. Mild narrowing of the lateral recesses left more than right. Definite neural compression is not established, but there would be some potential for neural irritation, particularly on the left.  L5-S1: Disc bulge. No compressive stenosis.  Bilateral sacroiliac osteoarthritis.   Electronically Signed By: Paulina Fusi M.D. On: 11/09/2019 12:08    Subjective: Chief Complaint  Patient presents with  . Lower Back - Follow-up    39 year old female with history of lumbar DDD, she was evaluated for persistent mechanical  back pain with pain with bending, stooping, riding in cars and with vacuuming and sweeping and with raking. She is experience pain that is nearly constant and taking oxycodone now  15 mg up to QID for pain in pain management with [facility], Dr. Lucretia Field. We have evaluated her, she has been to PT years ago and she is not exercising, as the exercises cause the pain to worsen. She underwent a second opinion with Dr. Ophelia Charter and he Felt that 3 level lumbar fusion was the only surgical solution that could be considered. Her use of meds has increased since last seen. Her insurance company has apparently denied surgical intervention even with a second opinion. We will need to further evaluate.   Review of Systems  Constitutional: Negative.   HENT: Negative.   Eyes: Negative.   Respiratory: Negative.   Cardiovascular: Negative.   Gastrointestinal: Negative.   Endocrine: Negative.   Genitourinary: Negative.   Musculoskeletal: Negative.   Skin: Negative.   Allergic/Immunologic: Negative.   Neurological: Negative.   Hematological: Negative.   Psychiatric/Behavioral: Negative.      Objective: Vital Signs: BP 105/73 (BP Location: Left Arm, Patient Position: Sitting)   Pulse 97   Ht 5\' 6"  (1.676 m)   Wt 189 lb 12.8 oz (86.1 kg)   BMI 30.63 kg/m   Physical Exam Constitutional:      Appearance:  She is well-developed.  HENT:     Head: Normocephalic and atraumatic.  Eyes:     Pupils: Pupils are equal, round, and reactive to light.  Pulmonary:     Effort: Pulmonary effort is normal.     Breath sounds: Normal breath sounds.  Abdominal:     General: Bowel sounds are normal.     Palpations: Abdomen is soft.  Musculoskeletal:     Cervical back: Normal range of motion and neck supple.     Lumbar back: Negative right straight leg raise test and negative left straight leg raise test.  Skin:    General: Skin is warm and dry.  Neurological:     Mental Status: She is alert and  oriented to person, place, and time.  Psychiatric:        Behavior: Behavior normal.        Thought Content: Thought content normal.        Judgment: Judgment normal.     Back Exam   Range of Motion  Extension: abnormal  Flexion: 80  Lateral bend right: 80  Lateral bend left:  80 normal  Rotation right: 80  Rotation left: 80   Muscle Strength  Right Quadriceps:  5/5  Right Hamstrings:  5/5  Left Hamstrings:  5/5   Tests  Straight leg raise right: negative Straight leg raise left: negative  Reflexes  Patellar: 2/4 Achilles: 2/4 Babinski's sign: normal   Other  Toe walk: normal Heel walk: normal      Specialty Comments:  No specialty comments available.  Imaging: No results found.   PMFS History: Patient Active Problem List   Diagnosis Date Noted  . Protrusion of lumbar intervertebral disc 03/31/2020  . Obesity (BMI 30.0-34.9) 04/11/2019  . Neuropathy 04/11/2019  . Hyperlipidemia 04/11/2019  . 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  . Overdose of antipsychotic 01/23/2017  . Hypoxia 01/17/2017  . 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 Bipolar I disorder, most recent episode depressed (HCC) 06/11/2013  . Depression with suicidal ideation 06/09/2013  . Alcohol abuse, episodic drinking behavior 06/09/2013  . Major depressive disorder, recurrent episode, severe, specified as with psychotic behavior 06/09/2013  . 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 SNORING 07/18/2009  . OVARIAN CYST 06/13/2009  . HYPERCHOLESTEROLEMIA 09/19/2008  . BACK PAIN 09/18/2008  . TOBACCO ABUSE 08/13/2008  . INSOMNIA 08/13/2008   Past Medical History:  Diagnosis Date  . Anxiety   . Back pain   . CAP (community acquired pneumonia)   . Chronic pain syndrome   . Depression   . Gallbladder problem   . Obsessive compulsive disorder     Family History  Problem Relation Age of Onset    . Diabetes insipidus Father   . Arthritis Father   . Heart failure Father   . COPD Father   . Emphysema Father   . Asthma Son     Past Surgical History:  Procedure Laterality Date  . CESAREAN SECTION    . CHOLECYSTECTOMY  2009  . wisdom teeth extraction     Social History   Occupational History  . Not on file  Tobacco Use  . Smoking status: Current Every Day Smoker    Packs/day: 0.50    Years: 23.00    Pack years: 11.50    Types: Cigarettes  . Smokeless tobacco: Never Used  Substance and Sexual Activity  . Alcohol use: Yes    Comment: unsure amount (1/6) -  states generally does not drink  . Drug use: No  . Sexual activity: Yes    Birth control/protection: None

## 2020-09-10 NOTE — Patient Instructions (Signed)
Plan: Repeat MRI as it is nearly one year since last study, I expect there may be more degenerative changes, assess the disc protrusions at each level L3-4, L4-5 and L5-S1. Avoid bending, stooping and avoid lifting weights greater than 10 lbs. Avoid prolong standing and walking. Order for a new walker with wheels. Surgery scheduling secretary Tivis Ringer, will call you in the next week to schedule for surgery.  Surgery recommended is a three level lumbar fusion L3-4, L4-5 and L5-S1 this would be done with rods, screws and cages with local bone graft and allograft (donor bone graft). Takeoxycodone for for pain.Note to Dr. Lucretia Field indicating that I believe increase in her narcotics is appropriate until surgery can proceed. Risk of surgery includes risk of infection 1 in 100 patients, bleeding 3-5% chance you would need a transfusion. Risk to the nerves is one in 10,000. You will need to use a brace for 3 months and wean from the brace on the 4th month. Expect improved walking and standing tolerance. Expect relief of leg pain but numbness may persist depending on the length and degree of pressure that has been present. Patient request a note to her pain management, Dr. Lucretia Field for increasing her narcotic medications and removing tylenol to prevent Liver sideeffects. I believe that she has pain, I am not a pain management specialist but I believe she has a tolerance of narcotics and Increasing her meds is appropriate. We are planning to intervene to decrease her mechanical pain and hopefully be decreasing her use of narcotics for pain control.  She report that she has completed PT recently and based on this she has completed conservative management. Will revisit obtaining authorization from her insurance company for 3 level lumbar fusion, if unable to obtain then have her seen for a  Second opinion. Has had a psychological eval and reports being told there was no psychological concern, this  was prior to her starting Treatment by Dr. Lucretia Field Follow-Up Instructions:Return in about 4 weeks (around 01/21/2020).

## 2020-09-21 ENCOUNTER — Other Ambulatory Visit: Payer: Self-pay

## 2020-09-21 ENCOUNTER — Emergency Department (HOSPITAL_COMMUNITY)
Admission: EM | Admit: 2020-09-21 | Discharge: 2020-09-21 | Disposition: A | Discharge status: Home / Self Care | Payer: Managed Care, Other (non HMO) | Attending: Emergency Medicine | Admitting: Emergency Medicine

## 2020-09-21 DIAGNOSIS — R739 Hyperglycemia, unspecified: Secondary | ICD-10-CM | POA: Diagnosis present

## 2020-09-21 DIAGNOSIS — R519 Headache, unspecified: Secondary | ICD-10-CM | POA: Insufficient documentation

## 2020-09-21 DIAGNOSIS — E1165 Type 2 diabetes mellitus with hyperglycemia: Secondary | ICD-10-CM | POA: Diagnosis not present

## 2020-09-21 DIAGNOSIS — Z5321 Procedure and treatment not carried out due to patient leaving prior to being seen by health care provider: Secondary | ICD-10-CM | POA: Diagnosis not present

## 2020-09-21 DIAGNOSIS — M6289 Other specified disorders of muscle: Secondary | ICD-10-CM | POA: Insufficient documentation

## 2020-09-21 LAB — I-STAT BETA HCG BLOOD, ED (MC, WL, AP ONLY): I-stat hCG, quantitative: <5 m[IU]/mL (ref <5)

## 2020-09-21 LAB — BASIC METABOLIC PANEL
Anion gap: 11 (ref 5–15)
BUN: 8 mg/dL (ref 6–20)
CO2: 24 mmol/L (ref 22–32)
Calcium: 9.3 mg/dL (ref 8.9–10.3)
Chloride: 100 mmol/L (ref 98–111)
Creatinine, Ser: 0.66 mg/dL (ref 0.44–1.00)
GFR, Estimated: >60 mL/min (ref >60)
Glucose, Bld: 254 mg/dL — ABNORMAL HIGH (ref 70–99)
Potassium: 3.7 mmol/L (ref 3.5–5.1)
Sodium: 135 mmol/L (ref 135–145)

## 2020-09-21 LAB — CBC
HCT: 43.6 % (ref 36.0–46.0)
Hemoglobin: 14.6 g/dL (ref 12.0–15.0)
MCH: 31.2 pg (ref 26.0–34.0)
MCHC: 33.5 g/dL (ref 30.0–36.0)
MCV: 93.2 fL (ref 80.0–100.0)
Platelets: 358 10*3/uL (ref 150–400)
RBC: 4.68 MIL/uL (ref 3.87–5.11)
RDW: 12.4 % (ref 11.5–15.5)
WBC: 18.7 10*3/uL — ABNORMAL HIGH (ref 4.0–10.5)
nRBC: 0 % (ref 0.0–0.2)

## 2020-09-21 LAB — URINALYSIS, ROUTINE W REFLEX MICROSCOPIC
Bacteria, UA: NONE SEEN
Bilirubin Urine: NEGATIVE
Glucose, UA: >=500 mg/dL — AB
Hgb urine dipstick: NEGATIVE
Ketones, ur: NEGATIVE mg/dL
Leukocytes,Ua: NEGATIVE
Nitrite: NEGATIVE
Protein, ur: NEGATIVE mg/dL
Specific Gravity, Urine: 1.023 (ref 1.005–1.030)
pH: 5 (ref 5.0–8.0)

## 2020-09-21 LAB — CBG MONITORING, ED
Glucose-Capillary: 273 mg/dL — ABNORMAL HIGH (ref 70–99)
Glucose-Capillary: 236 mg/dL — ABNORMAL HIGH (ref 70–99)

## 2020-09-21 NOTE — ED Notes (Signed)
Pt states she is leaving due to wait time  

## 2020-09-21 NOTE — ED Triage Notes (Signed)
Pt was at home and said she checked her sugars and they were in the upper 400's. Pt said she is a diabetic and not on insulin. Pt said she feels like her muscles are all tight and headaches. Pt sweaty.

## 2020-09-22 DIAGNOSIS — E1165 Type 2 diabetes mellitus with hyperglycemia: Secondary | ICD-10-CM | POA: Insufficient documentation

## 2020-09-28 ENCOUNTER — Ambulatory Visit
Admission: RE | Admit: 2020-09-28 | Discharge: 2020-09-28 | Disposition: A | Discharge status: Home / Self Care | Payer: Managed Care, Other (non HMO) | Source: Ambulatory Visit | Attending: Specialist | Admitting: Specialist

## 2020-09-28 DIAGNOSIS — M5126 Other intervertebral disc displacement, lumbar region: Secondary | ICD-10-CM

## 2020-09-28 DIAGNOSIS — M5136 Other intervertebral disc degeneration, lumbar region: Secondary | ICD-10-CM

## 2020-09-28 DIAGNOSIS — G894 Chronic pain syndrome: Secondary | ICD-10-CM

## 2020-09-28 DIAGNOSIS — M533 Sacrococcygeal disorders, not elsewhere classified: Secondary | ICD-10-CM

## 2020-09-28 DIAGNOSIS — M5116 Intervertebral disc disorders with radiculopathy, lumbar region: Secondary | ICD-10-CM

## 2020-10-15 ENCOUNTER — Telehealth: Payer: Self-pay | Admitting: Specialist

## 2020-10-15 ENCOUNTER — Ambulatory Visit: Payer: Managed Care, Other (non HMO) | Admitting: Specialist

## 2020-10-15 NOTE — Telephone Encounter (Signed)
Can you please call her

## 2020-10-15 NOTE — Telephone Encounter (Signed)
Pt can't make her appointment today due to COVID but she wants the results of her MRI so she was wondering if you could do them over the phone. Please give her a call at [phone].

## 2020-11-05 ENCOUNTER — Telehealth: Payer: Self-pay | Admitting: Specialist

## 2020-11-05 NOTE — Telephone Encounter (Signed)
Placed on Dr. Barbaraann Faster desk to sign

## 2020-11-05 NOTE — Telephone Encounter (Signed)
Pt is aware that this is ready for pick up at the front desk

## 2020-11-05 NOTE — Telephone Encounter (Signed)
Pt called stating she needs her handicap placard extended for another 6 months and she would like a CB when it's ready to be picked up.   [phone]

## 2021-02-17 ENCOUNTER — Other Ambulatory Visit: Payer: Self-pay

## 2021-02-17 ENCOUNTER — Encounter (HOSPITAL_COMMUNITY): Payer: Self-pay | Admitting: Emergency Medicine

## 2021-02-17 DIAGNOSIS — E1165 Type 2 diabetes mellitus with hyperglycemia: Secondary | ICD-10-CM | POA: Insufficient documentation

## 2021-02-17 DIAGNOSIS — F17210 Nicotine dependence, cigarettes, uncomplicated: Secondary | ICD-10-CM | POA: Insufficient documentation

## 2021-02-17 DIAGNOSIS — R739 Hyperglycemia, unspecified: Secondary | ICD-10-CM | POA: Diagnosis present

## 2021-02-17 LAB — BASIC METABOLIC PANEL
Anion gap: 11 (ref 5–15)
BUN: 8 mg/dL (ref 6–20)
CO2: 29 mmol/L (ref 22–32)
Calcium: 8.9 mg/dL (ref 8.9–10.3)
Chloride: 88 mmol/L — ABNORMAL LOW (ref 98–111)
Creatinine, Ser: 0.64 mg/dL (ref 0.44–1.00)
GFR, Estimated: >60 mL/min (ref >60)
Glucose, Bld: 611 mg/dL (ref 70–99)
Potassium: 4.1 mmol/L (ref 3.5–5.1)
Sodium: 128 mmol/L — ABNORMAL LOW (ref 135–145)

## 2021-02-17 LAB — CBC
HCT: 45.6 % (ref 36.0–46.0)
Hemoglobin: 15.8 g/dL — ABNORMAL HIGH (ref 12.0–15.0)
MCH: 30.7 pg (ref 26.0–34.0)
MCHC: 34.6 g/dL (ref 30.0–36.0)
MCV: 88.7 fL (ref 80.0–100.0)
Platelets: 440 10*3/uL — ABNORMAL HIGH (ref 150–400)
RBC: 5.14 MIL/uL — ABNORMAL HIGH (ref 3.87–5.11)
RDW: 13.2 % (ref 11.5–15.5)
WBC: 14.1 10*3/uL — ABNORMAL HIGH (ref 4.0–10.5)
nRBC: 0 % (ref 0.0–0.2)

## 2021-02-17 LAB — CBG MONITORING, ED: Glucose-Capillary: >600 mg/dL (ref 70–99)

## 2021-02-17 NOTE — ED Triage Notes (Signed)
Pt c/o blood sugars over 400. Pt states she has not been checking her blood sugars.

## 2021-02-18 ENCOUNTER — Emergency Department (HOSPITAL_COMMUNITY)
Admission: EM | Admit: 2021-02-18 | Discharge: 2021-02-18 | Disposition: A | Discharge status: Home / Self Care | Payer: Managed Care, Other (non HMO) | Attending: Emergency Medicine | Admitting: Emergency Medicine

## 2021-02-18 DIAGNOSIS — R739 Hyperglycemia, unspecified: Secondary | ICD-10-CM

## 2021-02-18 HISTORY — DX: Type 2 diabetes mellitus without complications: E11.9

## 2021-02-18 LAB — URINALYSIS, ROUTINE W REFLEX MICROSCOPIC
Bacteria, UA: NONE SEEN
Bilirubin Urine: NEGATIVE
Glucose, UA: >=500 mg/dL — AB
Hgb urine dipstick: NEGATIVE
Ketones, ur: NEGATIVE mg/dL
Leukocytes,Ua: NEGATIVE
Nitrite: NEGATIVE
Protein, ur: NEGATIVE mg/dL
Specific Gravity, Urine: 1.03 (ref 1.005–1.030)
pH: 6 (ref 5.0–8.0)

## 2021-02-18 LAB — CBG MONITORING, ED
Glucose-Capillary: 218 mg/dL — ABNORMAL HIGH (ref 70–99)
Glucose-Capillary: 214 mg/dL — ABNORMAL HIGH (ref 70–99)

## 2021-02-18 LAB — POC URINE PREG, ED: Preg Test, Ur: NEGATIVE

## 2021-02-18 MED ORDER — ALBUTEROL SULFATE HFA 108 (90 BASE) MCG/ACT IN AERS
2.0000 | INHALATION_SPRAY | Freq: Once | RESPIRATORY_TRACT | Status: AC
  Administered 2021-02-18: 2 via RESPIRATORY_TRACT
  Filled 2021-02-18: qty 6.7

## 2021-02-18 MED ORDER — SODIUM CHLORIDE 0.9 % IV BOLUS
1000.0000 mL | Freq: Once | INTRAVENOUS | Status: AC
  Administered 2021-02-18: 1000 mL via INTRAVENOUS

## 2021-02-18 MED ORDER — INSULIN ASPART 100 UNIT/ML IV SOLN
10.0000 [IU] | Freq: Once | INTRAVENOUS | Status: AC
  Administered 2021-02-18: 10 [IU] via INTRAVENOUS

## 2021-02-18 NOTE — ED Provider Notes (Signed)
[facility]-EMERGENCY DEPT [facility] Emergency Department Provider Note MRN:  [id number]  Arrival date & time: 02/18/21     Chief Complaint   Hyperglycemia   History of Present Illness   [name] is a 40 y.o. year-old female with a history of diabetes presenting to the ED with chief complaint of hyperglycemia.  Patient feeling malaise, feeling like her blood sugar was elevated.  Over 600 at home.  Here for evaluation.  Denies fever.  Getting over recent pneumonia, completed antibiotics.  Denies burning with urination, no rash, no chest pain, no abdominal pain.  Symptoms constant, mild, no exacerbating or alleviating factors.  Has not been checking her sugars very often.  Taking medicines like normal.  Review of Systems  A complete 10 system review of systems was obtained and all systems are negative except as noted in the HPI and PMH.   Patient's Health History    Past Medical History:  Diagnosis Date  . Anxiety   . Back pain   . CAP (community acquired pneumonia)   . Chronic pain syndrome   . Depression   . Diabetes mellitus without complication (HCC)   . Gallbladder problem   . Obsessive compulsive disorder     Past Surgical History:  Procedure Laterality Date  . CESAREAN SECTION    . CHOLECYSTECTOMY  2009  . wisdom teeth extraction      Family History  Problem Relation Age of Onset  . Diabetes insipidus Father   . Arthritis Father   . Heart failure Father   . COPD Father   . Emphysema Father   . Asthma Son     Social History   Socioeconomic History  . Marital status: Married    Spouse name: Not on file  . Number of children: Not on file  . Years of education: Not on file  . Highest education level: Not on file  Occupational History  . Not on file  Tobacco Use  . Smoking status: Current Every Day Smoker    Packs/day: 0.50    Years: 23.00    Pack years: 11.50    Types: Cigarettes  . Smokeless tobacco: Never Used  Substance and Sexual  Activity  . Alcohol use: Yes    Comment: unsure amount (1/6) - states generally does not drink  . Drug use: No  . Sexual activity: Yes    Birth control/protection: None  Other Topics Concern  . Not on file  Social History Narrative  . Not on file   Social Determinants of Health   Financial Resource Strain: Not on file  Food Insecurity: Not on file  Transportation Needs: Not on file  Physical Activity: Not on file  Stress: Not on file  Social Connections: Not on file  Intimate Partner Violence: Not on file     Physical Exam   Vitals:   02/18/21 0200 02/18/21 0230  BP: 118/83 118/77  Pulse: 79 87  Resp: 18 18  Temp:    SpO2: 90% (!) 89%    CONSTITUTIONAL: Well-appearing, NAD NEURO:  Alert and oriented x 3, no focal deficits EYES:  eyes equal and reactive ENT/NECK:  no LAD, no JVD CARDIO: Regular rate, well-perfused, normal S1 and S2 PULM:  CTAB no wheezing or rhonchi GI/GU:  normal bowel sounds, non-distended, non-tender MSK/SPINE:  No gross deformities, no edema SKIN:  no rash, atraumatic PSYCH:  Appropriate speech and behavior  *Additional and/or pertinent findings included in MDM below  Diagnostic and Interventional Summary  EKG Interpretation  Date/Time:    Ventricular Rate:    PR Interval:    QRS Duration:   QT Interval:    QTC Calculation:   R Axis:     Text Interpretation:        Labs Reviewed  BASIC METABOLIC PANEL - Abnormal; Notable for the following components:      Result Value   Sodium 128 (*)    Chloride 88 (*)    Glucose, Bld 611 (*)    All other components within normal limits  CBC - Abnormal; Notable for the following components:   WBC 14.1 (*)    RBC 5.14 (*)    Hemoglobin 15.8 (*)    Platelets 440 (*)    All other components within normal limits  URINALYSIS, ROUTINE W REFLEX MICROSCOPIC - Abnormal; Notable for the following components:   Color, Urine STRAW (*)    Glucose, UA >=500 (*)    All other components within normal  limits  CBG MONITORING, ED - Abnormal; Notable for the following components:   Glucose-Capillary >600 (*)    All other components within normal limits  CBG MONITORING, ED - Abnormal; Notable for the following components:   Glucose-Capillary 218 (*)    All other components within normal limits  CBG MONITORING, ED - Abnormal; Notable for the following components:   Glucose-Capillary 214 (*)    All other components within normal limits  POC URINE PREG, ED    No orders to display    Medications  sodium chloride 0.9 % bolus 1,000 mL (0 mLs Intravenous Stopped 02/18/21 0130)  insulin aspart (novoLOG) injection 10 Units (10 Units Intravenous Given 02/18/21 0030)  albuterol (VENTOLIN HFA) 108 (90 Base) MCG/ACT inhaler 2 puff (2 puffs Inhalation Given 02/18/21 0059)     Procedures  /  Critical Care Procedures  ED Course and Medical Decision Making  I have reviewed the triage vital signs, the nursing notes, and pertinent available records from the EMR.  Listed above are laboratory and imaging tests that I personally ordered, reviewed, and interpreted and then considered in my medical decision making (see below for details).  Hyperglycemia but no evidence of DKA, normal vital signs, no infectious symptoms, possibly labile due to recent pneumonia, or due to diet.  Providing insulin, fluids, will recheck sugar, anticipating discharge.     Blood sugar improved and appears to be stabilizing in the 200s, continues to look and feel well with normal vital signs, appropriate for discharge.  Elmer Sow. Pilar Plate, MD [facility] Emergency Medicine [facility] [email or web address]  Final Clinical Impressions(s) / ED Diagnoses     ICD-10-CM   1. Hyperglycemia  R73.9     ED Discharge Orders    None       Discharge Instructions Discussed with and Provided to Patient:     Discharge Instructions     You were evaluated in the Emergency Department and after careful evaluation, we did  not find any emergent condition requiring admission or further testing in the hospital.  Your exam/testing today was overall reassuring.  Recommend more frequent checking of your blood sugar and follow-up with your primary care doctor.  Please return to the Emergency Department if you experience any worsening of your condition.  Thank you for allowing [place] to be a part of your care.        Sabas Sous, MD 02/18/21 [phone]

## 2021-02-18 NOTE — Discharge Instructions (Addendum)
You were evaluated in the Emergency Department and after careful evaluation, we did not find any emergent condition requiring admission or further testing in the hospital.  Your exam/testing today was overall reassuring.  Recommend more frequent checking of your blood sugar and follow-up with your primary care doctor.  Please return to the Emergency Department if you experience any worsening of your condition.  Thank you for allowing [place] to be a part of your care.

## 2021-02-23 ENCOUNTER — Telehealth: Payer: Self-pay | Admitting: Specialist

## 2021-02-23 NOTE — Telephone Encounter (Signed)
PT called and states that she had received a call from here. I didn't see that she had an appt or any kind of note in the chart. CB [phone]

## 2021-02-23 NOTE — Telephone Encounter (Signed)
I have not called patient.  Sherrie, have you tired to call this patient?

## 2021-02-24 NOTE — Telephone Encounter (Signed)
I had not called her either.  I called patient and advised we are not sure who would have called her.

## 2021-03-09 ENCOUNTER — Emergency Department (HOSPITAL_COMMUNITY): Payer: Managed Care, Other (non HMO)

## 2021-03-09 ENCOUNTER — Inpatient Hospital Stay (HOSPITAL_COMMUNITY)
Admission: EM | Admit: 2021-03-09 | Discharge: 2021-03-11 | DRG: 193 | Disposition: A | Discharge status: Home / Self Care | Payer: Managed Care, Other (non HMO) | Attending: Internal Medicine | Admitting: Internal Medicine

## 2021-03-09 ENCOUNTER — Inpatient Hospital Stay (HOSPITAL_COMMUNITY): Payer: Managed Care, Other (non HMO)

## 2021-03-09 ENCOUNTER — Other Ambulatory Visit: Payer: Self-pay

## 2021-03-09 ENCOUNTER — Encounter (HOSPITAL_COMMUNITY): Payer: Self-pay | Admitting: Emergency Medicine

## 2021-03-09 DIAGNOSIS — F429 Obsessive-compulsive disorder, unspecified: Secondary | ICD-10-CM | POA: Diagnosis present

## 2021-03-09 DIAGNOSIS — J9691 Respiratory failure, unspecified with hypoxia: Secondary | ICD-10-CM | POA: Diagnosis present

## 2021-03-09 DIAGNOSIS — F988 Other specified behavioral and emotional disorders with onset usually occurring in childhood and adolescence: Secondary | ICD-10-CM | POA: Diagnosis present

## 2021-03-09 DIAGNOSIS — E785 Hyperlipidemia, unspecified: Secondary | ICD-10-CM | POA: Diagnosis present

## 2021-03-09 DIAGNOSIS — J189 Pneumonia, unspecified organism: Principal | ICD-10-CM | POA: Diagnosis present

## 2021-03-09 DIAGNOSIS — Z79899 Other long term (current) drug therapy: Secondary | ICD-10-CM

## 2021-03-09 DIAGNOSIS — J9601 Acute respiratory failure with hypoxia: Secondary | ICD-10-CM | POA: Diagnosis present

## 2021-03-09 DIAGNOSIS — Z7984 Long term (current) use of oral hypoglycemic drugs: Secondary | ICD-10-CM

## 2021-03-09 DIAGNOSIS — E1165 Type 2 diabetes mellitus with hyperglycemia: Secondary | ICD-10-CM | POA: Diagnosis present

## 2021-03-09 DIAGNOSIS — Z888 Allergy status to other drugs, medicaments and biological substances status: Secondary | ICD-10-CM

## 2021-03-09 DIAGNOSIS — Z20822 Contact with and (suspected) exposure to covid-19: Secondary | ICD-10-CM | POA: Diagnosis present

## 2021-03-09 DIAGNOSIS — E871 Hypo-osmolality and hyponatremia: Secondary | ICD-10-CM | POA: Diagnosis present

## 2021-03-09 DIAGNOSIS — F17210 Nicotine dependence, cigarettes, uncomplicated: Secondary | ICD-10-CM | POA: Diagnosis present

## 2021-03-09 DIAGNOSIS — T380X5A Adverse effect of glucocorticoids and synthetic analogues, initial encounter: Secondary | ICD-10-CM | POA: Diagnosis not present

## 2021-03-09 DIAGNOSIS — F32A Depression, unspecified: Secondary | ICD-10-CM | POA: Diagnosis present

## 2021-03-09 DIAGNOSIS — G894 Chronic pain syndrome: Secondary | ICD-10-CM | POA: Diagnosis present

## 2021-03-09 DIAGNOSIS — R0602 Shortness of breath: Secondary | ICD-10-CM | POA: Diagnosis present

## 2021-03-09 DIAGNOSIS — Z9049 Acquired absence of other specified parts of digestive tract: Secondary | ICD-10-CM | POA: Diagnosis not present

## 2021-03-09 DIAGNOSIS — F1010 Alcohol abuse, uncomplicated: Secondary | ICD-10-CM | POA: Diagnosis present

## 2021-03-09 DIAGNOSIS — R739 Hyperglycemia, unspecified: Secondary | ICD-10-CM

## 2021-03-09 DIAGNOSIS — Z8249 Family history of ischemic heart disease and other diseases of the circulatory system: Secondary | ICD-10-CM | POA: Diagnosis not present

## 2021-03-09 DIAGNOSIS — B370 Candidal stomatitis: Secondary | ICD-10-CM

## 2021-03-09 DIAGNOSIS — Z825 Family history of asthma and other chronic lower respiratory diseases: Secondary | ICD-10-CM

## 2021-03-09 DIAGNOSIS — Z720 Tobacco use: Secondary | ICD-10-CM | POA: Diagnosis present

## 2021-03-09 DIAGNOSIS — F17200 Nicotine dependence, unspecified, uncomplicated: Secondary | ICD-10-CM | POA: Diagnosis present

## 2021-03-09 DIAGNOSIS — Z7952 Long term (current) use of systemic steroids: Secondary | ICD-10-CM

## 2021-03-09 DIAGNOSIS — Z794 Long term (current) use of insulin: Secondary | ICD-10-CM

## 2021-03-09 DIAGNOSIS — J440 Chronic obstructive pulmonary disease with acute lower respiratory infection: Secondary | ICD-10-CM | POA: Diagnosis present

## 2021-03-09 DIAGNOSIS — Z7982 Long term (current) use of aspirin: Secondary | ICD-10-CM

## 2021-03-09 DIAGNOSIS — Z8701 Personal history of pneumonia (recurrent): Secondary | ICD-10-CM | POA: Diagnosis not present

## 2021-03-09 DIAGNOSIS — E7800 Pure hypercholesterolemia, unspecified: Secondary | ICD-10-CM | POA: Diagnosis present

## 2021-03-09 DIAGNOSIS — F419 Anxiety disorder, unspecified: Secondary | ICD-10-CM | POA: Diagnosis present

## 2021-03-09 DIAGNOSIS — Z880 Allergy status to penicillin: Secondary | ICD-10-CM

## 2021-03-09 DIAGNOSIS — Z8261 Family history of arthritis: Secondary | ICD-10-CM

## 2021-03-09 DIAGNOSIS — E119 Type 2 diabetes mellitus without complications: Secondary | ICD-10-CM | POA: Insufficient documentation

## 2021-03-09 LAB — LACTIC ACID, PLASMA
Lactic Acid, Venous: 1.3 mmol/L (ref 0.5–1.9)
Lactic Acid, Venous: 1.1 mmol/L (ref 0.5–1.9)

## 2021-03-09 LAB — CBC WITH DIFFERENTIAL/PLATELET
Abs Immature Granulocytes: 0.07 10*3/uL (ref 0.00–0.07)
Basophils Absolute: 0 10*3/uL (ref 0.0–0.1)
Basophils Relative: 0 %
Eosinophils Absolute: 0.4 10*3/uL (ref 0.0–0.5)
Eosinophils Relative: 3 %
HCT: 45.2 % (ref 36.0–46.0)
Hemoglobin: 15.7 g/dL — ABNORMAL HIGH (ref 12.0–15.0)
Immature Granulocytes: 1 %
Lymphocytes Relative: 13 %
Lymphs Abs: 1.7 10*3/uL (ref 0.7–4.0)
MCH: 30.8 pg (ref 26.0–34.0)
MCHC: 34.7 g/dL (ref 30.0–36.0)
MCV: 88.8 fL (ref 80.0–100.0)
Monocytes Absolute: 1.2 10*3/uL — ABNORMAL HIGH (ref 0.1–1.0)
Monocytes Relative: 9 %
Neutro Abs: 9.6 10*3/uL — ABNORMAL HIGH (ref 1.7–7.7)
Neutrophils Relative %: 74 %
Platelets: 326 10*3/uL (ref 150–400)
RBC: 5.09 MIL/uL (ref 3.87–5.11)
RDW: 13.3 % (ref 11.5–15.5)
WBC: 13 10*3/uL — ABNORMAL HIGH (ref 4.0–10.5)
nRBC: 0 % (ref 0.0–0.2)

## 2021-03-09 LAB — HEPATIC FUNCTION PANEL
ALT: 17 U/L (ref 0–44)
AST: 14 U/L — ABNORMAL LOW (ref 15–41)
Albumin: 2.8 g/dL — ABNORMAL LOW (ref 3.5–5.0)
Alkaline Phosphatase: 103 U/L (ref 38–126)
Bilirubin, Direct: 0.1 mg/dL (ref 0.0–0.2)
Indirect Bilirubin: 0.6 mg/dL (ref 0.3–0.9)
Total Bilirubin: 0.7 mg/dL (ref 0.3–1.2)
Total Protein: 6.2 g/dL — ABNORMAL LOW (ref 6.5–8.1)

## 2021-03-09 LAB — D-DIMER, QUANTITATIVE: D-Dimer, Quant: 0.34 ug/mL-FEU (ref 0.00–0.50)

## 2021-03-09 LAB — BASIC METABOLIC PANEL
Anion gap: 12 (ref 5–15)
BUN: 7 mg/dL (ref 6–20)
CO2: 24 mmol/L (ref 22–32)
Calcium: 8.6 mg/dL — ABNORMAL LOW (ref 8.9–10.3)
Chloride: 94 mmol/L — ABNORMAL LOW (ref 98–111)
Creatinine, Ser: 0.54 mg/dL (ref 0.44–1.00)
GFR, Estimated: >60 mL/min (ref >60)
Glucose, Bld: 369 mg/dL — ABNORMAL HIGH (ref 70–99)
Potassium: 3.9 mmol/L (ref 3.5–5.1)
Sodium: 130 mmol/L — ABNORMAL LOW (ref 135–145)

## 2021-03-09 LAB — TROPONIN I (HIGH SENSITIVITY)
Troponin I (High Sensitivity): 5 ng/L (ref <18)
Troponin I (High Sensitivity): 4 ng/L (ref <18)

## 2021-03-09 LAB — BRAIN NATRIURETIC PEPTIDE: B Natriuretic Peptide: 29 pg/mL (ref 0.0–100.0)

## 2021-03-09 LAB — GLUCOSE, CAPILLARY
Glucose-Capillary: 481 mg/dL — ABNORMAL HIGH (ref 70–99)
Glucose-Capillary: 370 mg/dL — ABNORMAL HIGH (ref 70–99)
Glucose-Capillary: 329 mg/dL — ABNORMAL HIGH (ref 70–99)

## 2021-03-09 LAB — RESP PANEL BY RT-PCR (FLU A&B, COVID) ARPGX2
Influenza A by PCR: NEGATIVE
Influenza B by PCR: NEGATIVE
SARS Coronavirus 2 by RT PCR: NEGATIVE

## 2021-03-09 LAB — HIV ANTIBODY (ROUTINE TESTING W REFLEX): HIV Screen 4th Generation wRfx: NONREACTIVE

## 2021-03-09 MED ORDER — VANCOMYCIN HCL 1500 MG/300ML IV SOLN
1500.0000 mg | Freq: Two times a day (BID) | INTRAVENOUS | Status: DC
  Administered 2021-03-10: 1500 mg via INTRAVENOUS
  Filled 2021-03-09 (×2): qty 300

## 2021-03-09 MED ORDER — SODIUM CHLORIDE 0.9 % IV SOLN
2.0000 g | Freq: Three times a day (TID) | INTRAVENOUS | Status: DC
  Administered 2021-03-09 – 2021-03-10 (×3): 2 g via INTRAVENOUS
  Filled 2021-03-09 (×4): qty 2

## 2021-03-09 MED ORDER — INSULIN ASPART 100 UNIT/ML IJ SOLN
0.0000 [IU] | Freq: Three times a day (TID) | INTRAMUSCULAR | Status: DC
  Administered 2021-03-10 (×2): 11 [IU] via SUBCUTANEOUS

## 2021-03-09 MED ORDER — ALBUTEROL SULFATE HFA 108 (90 BASE) MCG/ACT IN AERS
6.0000 | INHALATION_SPRAY | Freq: Once | RESPIRATORY_TRACT | Status: AC
  Administered 2021-03-09: 6 via RESPIRATORY_TRACT
  Filled 2021-03-09: qty 6.7

## 2021-03-09 MED ORDER — IPRATROPIUM-ALBUTEROL 0.5-2.5 (3) MG/3ML IN SOLN
3.0000 mL | Freq: Four times a day (QID) | RESPIRATORY_TRACT | Status: DC
  Administered 2021-03-09: 3 mL via RESPIRATORY_TRACT
  Filled 2021-03-09: qty 3

## 2021-03-09 MED ORDER — INSULIN ASPART 100 UNIT/ML IJ SOLN
8.0000 [IU] | Freq: Once | INTRAMUSCULAR | Status: AC
  Administered 2021-03-09: 8 [IU] via INTRAVENOUS

## 2021-03-09 MED ORDER — AEROCHAMBER PLUS FLO-VU LARGE MISC
1.0000 | Freq: Once | Status: AC
  Administered 2021-03-09: 1

## 2021-03-09 MED ORDER — MAGNESIUM SULFATE 2 GM/50ML IV SOLN
2.0000 g | Freq: Once | INTRAVENOUS | Status: AC
  Administered 2021-03-09: 2 g via INTRAVENOUS
  Filled 2021-03-09: qty 50

## 2021-03-09 MED ORDER — PANTOPRAZOLE SODIUM 40 MG IV SOLR
40.0000 mg | INTRAVENOUS | Status: DC
  Administered 2021-03-09 – 2021-03-10 (×2): 40 mg via INTRAVENOUS
  Filled 2021-03-09 (×2): qty 40

## 2021-03-09 MED ORDER — NICOTINE 21 MG/24HR TD PT24
21.0000 mg | MEDICATED_PATCH | Freq: Every day | TRANSDERMAL | Status: DC
  Administered 2021-03-09 – 2021-03-11 (×4): 21 mg via TRANSDERMAL
  Filled 2021-03-09 (×4): qty 1

## 2021-03-09 MED ORDER — SODIUM CHLORIDE 0.9 % IV SOLN: 2.0000 g | Freq: Once | INTRAVENOUS | Status: DC

## 2021-03-09 MED ORDER — VANCOMYCIN HCL 1750 MG/350ML IV SOLN
1750.0000 mg | Freq: Once | INTRAVENOUS | Status: AC
  Administered 2021-03-09: 1750 mg via INTRAVENOUS
  Filled 2021-03-09: qty 350

## 2021-03-09 MED ORDER — SODIUM CHLORIDE 0.9 % IV SOLN
500.0000 mg | INTRAVENOUS | Status: DC
  Administered 2021-03-09: 500 mg via INTRAVENOUS
  Filled 2021-03-09: qty 500

## 2021-03-09 MED ORDER — IPRATROPIUM-ALBUTEROL 0.5-2.5 (3) MG/3ML IN SOLN: 3.0000 mL | Freq: Three times a day (TID) | RESPIRATORY_TRACT | Status: DC

## 2021-03-09 MED ORDER — METHYLPREDNISOLONE SODIUM SUCC 125 MG IJ SOLR
125.0000 mg | Freq: Once | INTRAMUSCULAR | Status: AC
  Administered 2021-03-09: 125 mg via INTRAVENOUS
  Filled 2021-03-09: qty 2

## 2021-03-09 MED ORDER — ENOXAPARIN SODIUM 40 MG/0.4ML IJ SOSY
40.0000 mg | PREFILLED_SYRINGE | INTRAMUSCULAR | Status: DC
  Administered 2021-03-09 – 2021-03-10 (×2): 40 mg via SUBCUTANEOUS
  Filled 2021-03-09 (×2): qty 0.4

## 2021-03-09 MED ORDER — METHYLPREDNISOLONE SODIUM SUCC 125 MG IJ SOLR
80.0000 mg | Freq: Two times a day (BID) | INTRAMUSCULAR | Status: DC
  Administered 2021-03-09 – 2021-03-10 (×2): 80 mg via INTRAVENOUS
  Filled 2021-03-09 (×2): qty 2

## 2021-03-09 MED ORDER — ALBUTEROL SULFATE (2.5 MG/3ML) 0.083% IN NEBU: 3.0000 mL | INHALATION_SOLUTION | Freq: Four times a day (QID) | RESPIRATORY_TRACT | Status: DC | PRN

## 2021-03-09 MED ORDER — SODIUM CHLORIDE 0.9 % IV SOLN: INTRAVENOUS | Status: DC

## 2021-03-09 MED ORDER — INSULIN ASPART 100 UNIT/ML IJ SOLN
0.0000 [IU] | Freq: Every day | INTRAMUSCULAR | Status: DC
  Administered 2021-03-09: 4 [IU] via SUBCUTANEOUS

## 2021-03-09 NOTE — Progress Notes (Signed)
Pharmacy Antibiotic Note  [name] is a 40 y.o. female admitted on 03/09/2021 with pneumonia.  Pharmacy has been consulted for vancomycin dosing.  Plan: Vanc1750mg  x1 then 1500mg  IV q12h -monitor renal function, clinical status, and peak/trough as needed    Temp (24hrs), Avg:98.3 F (36.8 C), Min:98 F (36.7 C), Max:98.5 F (36.9 C)  Recent Labs  Lab 03/09/21 1220  WBC 13.0*  CREATININE 0.54    CrCl cannot be calculated (Unknown ideal weight.).    Allergies  Allergen Reactions  . Orange Oil Shortness Of Breath  . Penicillins Other (See Comments)  . Carisoprodol     Skin peeled   . Piroxicam     Skin peeled    Antimicrobials this admission: Vanc 5/30 >>  Cefepime 5/30 >>  Dose adjustments this admission: N/A  Microbiology results: 5/30 resp panel - pending  Thank you for allowing pharmacy to be a part of this patient's care.  6/30, PharmD, BCCCP Emergency Medicine Clinical Pharmacist  Please check AMION for all [facility] Pharmacy phone numbers After 10:00 PM, call Main Pharmacy [phone]

## 2021-03-09 NOTE — ED Notes (Signed)
Pt transported to 2W room 11 with SWOT.

## 2021-03-09 NOTE — ED Triage Notes (Signed)
C/o SOB x 2 weeks with a dry cough and pain all over.

## 2021-03-09 NOTE — ED Notes (Signed)
Report given to [place], RN on 2W.

## 2021-03-09 NOTE — ED Provider Notes (Signed)
Emergency Medicine Provider Triage Evaluation Note  [name] , a 40 y.o. female  was evaluated in triage.  Pt complains of sob that started 2 days ago. Recently dx with cap and tx with several rounds of abx and 4 rounds of steroids in the last 3-4 weeks without relief. Reports some mild chest pain, cough and wheezing.  Review of Systems  Positive: Cough, sob, chest pain, wheezing, fevers Negative: abd pain  Physical Exam  BP 102/81 (BP Location: Right Arm)   Pulse (!) 103   Temp 98.5 F (36.9 C)   Resp 20   SpO2 (!) 87%  Gen:   Awake, no distress   Resp:  Normal effort  MSK:   Moves extremities without difficulty  Other:  Expiratory wheezing, dry cough  Medical Decision Making  Medically screening exam initiated at 12:08 PM.  Appropriate orders placed.  [name] was informed that the remainder of the evaluation will be completed by another provider, this initial triage assessment does not replace that evaluation, and the importance of remaining in the ED until their evaluation is complete.     Rayne Du 03/09/21 1212    Cheryll Cockayne, MD 03/09/21 1538

## 2021-03-09 NOTE — ED Provider Notes (Signed)
[facility] EMERGENCY DEPARTMENT Provider Note   CSN: [id number] Arrival date & time: 03/09/21  1158     History No chief complaint on file.   [name] is a 40 y.o. female.  The history is provided by the patient and medical records. No language interpreter was used.     40 year old female significant history of diabetes, anxiety, treated for community-acquired pneumonia who presents today with complaints of shortness of breath.  Patient mentions since February of this year she has had recurrent infection of her lungs.  She endorsed persistent nausea and productive cough, posttussive emesis, shortness of breath, wheezing.  States she was diagnosed with pneumonia, treated with multiple rounds of antibiotics and steroid as well as being diagnosed with bronchitis and treated with the same for the past several months but symptoms still is recurring.  For the past 2 weeks she endorsed worsening of her symptoms thus prompting this ER visit.  She also endorsed having subjective fever and chills for the past several days.  She endorsed having thrush in her mouth from persistent antibiotic and steroid use.  She denies did not test positive for COVID in the past.  She has been fully vaccinated for COVID already.  Past Medical History:  Diagnosis Date  . Anxiety   . Back pain   . CAP (community acquired pneumonia)   . Chronic pain syndrome   . Depression   . Diabetes mellitus without complication (HCC)   . Gallbladder problem   . Obsessive compulsive disorder     Patient Active Problem List   Diagnosis Date Noted  . Type 2 diabetes mellitus with hyperglycemia (HCC) 09/22/2020  . Protrusion of lumbar intervertebral disc 03/31/2020  . Obesity (BMI 30.0-34.9) 04/11/2019  . Neuropathy 04/11/2019  . Hyperlipidemia 04/11/2019  . 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  . Uncontrolled type 2 diabetes mellitus, without long-term current  use of insulin (HCC) 04/11/2019  . Overdose of antipsychotic 01/23/2017  . Hypoxia 01/17/2017  . 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 Bipolar I disorder, most recent episode depressed (HCC) 06/11/2013  . Depression with suicidal ideation 06/09/2013  . Alcohol abuse, episodic drinking behavior 06/09/2013  . Major depressive disorder, recurrent episode, severe, specified as with psychotic behavior 06/09/2013  . 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 SNORING 07/18/2009  . Ovarian cyst 06/13/2009  . HYPERCHOLESTEROLEMIA 09/19/2008  . Back pain 09/18/2008  . TOBACCO ABUSE 08/13/2008  . Insomnia 08/13/2008    Past Surgical History:  Procedure Laterality Date  . CESAREAN SECTION    . CHOLECYSTECTOMY  2009  . wisdom teeth extraction       OB History    Gravida  1   Para  1   Term  1   Preterm      AB      Living  1     SAB      IAB      Ectopic      Multiple      Live Births  1           Family History  Problem Relation Age of Onset  . Diabetes insipidus Father   . Arthritis Father   . Heart failure Father   . COPD Father   . Emphysema Father   . Asthma Son     Social History   Tobacco Use  . Smoking status: Current Every Day Smoker  Packs/day: 0.50    Years: 23.00    Pack years: 11.50    Types: Cigarettes  . Smokeless tobacco: Never Used  Substance Use Topics  . Alcohol use: Yes    Comment: unsure amount (1/6) - states generally does not drink  . Drug use: No    Home Medications Prior to Admission medications   Medication Sig Start Date End Date Taking? Authorizing Provider  albuterol (PROVENTIL HFA;VENTOLIN HFA) 108 (90 Base) MCG/ACT inhaler Inhale 1-2 puffs into the lungs every 6 (six) hours as needed for wheezing or shortness of breath. 04/17/18   Ivery Quale, PA-C  amphetamine-dextroamphetamine (ADDERALL) 30 MG tablet Take by mouth. 01/21/21   [provider]   amphetamine-dextroamphetamine (ADDERALL) 30 MG tablet Take 1 tablet by mouth 2 (two) times daily. 09/28/20   [provider]  ARIPiprazole (ABILIFY) 10 MG tablet Take 1 tablet by mouth daily. 01/21/21   [provider]  ARIPiprazole (ABILIFY) 5 MG tablet Take 1 tablet (5 mg total) by mouth daily. For major depressive disorder. 06/13/13   Tamala Julian, PA-C  aspirin EC 81 MG tablet Take 1 tablet (81 mg total) by mouth daily. 05/10/17   Bing Neighbors, FNP  baclofen (LIORESAL) 10 MG tablet Take 10 mg by mouth 3 (three) times daily. 09/29/20   [provider]  BD PEN NEEDLE NANO 2ND GEN 32G X 4 MM MISC daily. 09/23/20   [provider]  buprenorphine (BUTRANS) 10 MCG/HR PTWK 1 patch once a week. 10/08/20   [provider]  busPIRone (BUSPAR) 15 MG tablet TAKE 1 TABLET(15 MG) BY MOUTH THREE TIMES DAILY 01/08/21   [provider]  busPIRone (BUSPAR) 15 MG tablet Take 15 mg by mouth 3 (three) times daily. 10/08/20   [provider]  cyclobenzaprine (FLEXERIL) 10 MG tablet cyclobenzaprine 10 mg tablet    [provider]  dexamethasone (DECADRON) 4 MG tablet Take 1 tablet (4 mg total) by mouth 2 (two) times daily with a meal. 04/17/18   Ivery Quale, PA-C  diazepam (VALIUM) 5 MG tablet Take one po at the time of the MRI after signing forms may repeat in 30 minutes 09/10/20   Kerrin Champagne, MD  Empagliflozin-metFORMIN HCl (SYNJARDY) 12.02-999 MG TABS Take by mouth. 02/12/21   [provider]  ezetimibe (ZETIA) 10 MG tablet Take 1 tablet (10 mg total) by mouth daily. 09/26/17   Bing Neighbors, FNP  ezetimibe (ZETIA) 10 MG tablet Take 1 tablet by mouth daily. 01/21/21   [provider]  gabapentin (NEURONTIN) 100 MG capsule Take 1 capsule (100 mg total) by mouth daily after breakfast. 02/14/20   Kerrin Champagne, MD  gabapentin (NEURONTIN) 100 MG capsule Take 1 capsule by mouth daily. 01/21/21   [provider]   gabapentin (NEURONTIN) 300 MG capsule Take 1-2 capsules (300-600 mg total) by mouth at bedtime. Only take as prescribed.  Husband is to administer medication 02/14/20   Kerrin Champagne, MD  gabapentin (NEURONTIN) 300 MG capsule Take by mouth. 01/21/21   [provider]  glipiZIDE (GLUCOTROL) 10 MG tablet glipizide 10 mg tablet  TAKE 1 TABLET BY MOUTH DAILY BEFORE BREAKFAST    [provider]  HYDROcodone-acetaminophen (NORCO/VICODIN) 5-325 MG tablet hydrocodone 5 mg-acetaminophen 325 mg tablet    [provider]  Insulin Glargine (BASAGLAR KWIKPEN) 100 UNIT/ML SMARTSIG:15 Unit(s) SUB-Q Every Evening 09/23/20   [provider]  insulin glargine (LANTUS SOLOSTAR) 100 UNIT/ML Solostar Pen Lantus Solostar U-100  Insulin 100 unit/mL (3 mL) subcutaneous pen  INJECT 10 UNITS INTO THE SKIN DAILY AT 10 PM    [provider]  lidocaine (XYLOCAINE) 2 % solution SMARTSIG:5 Milliliter(s) By Mouth PRN 10/16/20   [provider]  lisinopril (PRINIVIL,ZESTRIL) 2.5 MG tablet Take 1 tablet (2.5 mg total) by mouth daily. 05/10/17   Bing NeighborsHarris, Kimberly S, FNP  metaxalone (SKELAXIN) 800 MG tablet Take 800 mg by mouth 3 (three) times daily as needed. 10/08/20   [provider]  metFORMIN (GLUCOPHAGE) 1000 MG tablet Take 0.5 tablets (500 mg total) by mouth 2 (two) times daily with a meal. 02/14/20 08/12/20  Kerrin ChampagneNitka, James E, MD  metFORMIN (GLUCOPHAGE) 1000 MG tablet metformin 1,000 mg tablet  TAKE 1 TABLET BY MOUTH TWICE DAILY WITH A MEAL    [provider]  mupirocin ointment (BACTROBAN) 2 % Apply to affected area 3 times daily 01/21/21 01/21/22  [provider]  nalbuphine (NUBAIN) 20 MG/ML injection nalbuphine 20 mg/mL injection solution  Take 20 mg every day by injection route for 1 day.    [provider]  ondansetron (ZOFRAN) 4 MG tablet Take by mouth. 09/11/20   [provider]  ondansetron (ZOFRAN) 4 MG tablet Take 4 mg by mouth 3  (three) times daily as needed. 09/29/20   [provider]  oxyCODONE (ROXICODONE) 15 MG immediate release tablet Take 15 mg by mouth 3 (three) times daily as needed. 03/15/20   [provider]  oxyCODONE-acetaminophen (PERCOCET/ROXICET) 5-325 MG tablet oxycodone-acetaminophen 5 mg-325 mg tablet    [provider]  pravastatin (PRAVACHOL) 80 MG tablet Take 1 tablet (80 mg total) by mouth daily. Patient taking differently: Take 80 mg by mouth every morning.  05/11/17   Bing NeighborsHarris, Kimberly S, FNP  predniSONE (DELTASONE) 20 MG tablet prednisone 20 mg tablet    [provider]  promethazine (PHENERGAN) 25 MG/ML injection promethazine 25 mg/mL injection solution  Take 25 mg every day by injection route for 1 day.    [provider]  Semaglutide, 1 MG/DOSE, (OZEMPIC, 1 MG/DOSE,) 4 MG/3ML SOPN Inject 1 mg into the skin once a week. 02/12/21   [provider]  sertraline (ZOLOFT) 100 MG tablet Take 2 tablets (200 mg total) by mouth daily. For depression and anxiety. 06/13/13   Tamala JulianMashburn, Neil T, PA-C  traZODone (DESYREL) 100 MG tablet Take 200 mg by mouth at bedtime. For insomnia.  06/13/13   Tamala JulianMashburn, Neil T, PA-C  traZODone (DESYREL) 100 MG tablet Take 2 tablets by mouth at bedtime. 01/21/21   [provider]    Allergies    Orange oil, Penicillins, Carisoprodol, and Piroxicam  Review of Systems   Review of Systems  All other systems reviewed and are negative.   Physical Exam Updated Vital Signs BP 102/81 (BP Location: Right Arm)   Pulse (!) 103   Temp 98.5 F (36.9 C)   Resp 20   SpO2 (!) 87%   Physical Exam Vitals and nursing note reviewed.  Constitutional:      General: She is not in acute distress.    Appearance: She is well-developed.  HENT:     Head: Atraumatic.     Mouth/Throat:     Mouth: Mucous membranes are moist.     Comments: White coating on tongue consistent with thrush Eyes:     Conjunctiva/sclera: Conjunctivae normal.   Cardiovascular:     Rate and Rhythm: Tachycardia present.     Pulses: Normal pulses.  Heart sounds: Normal heart sounds.  Pulmonary:     Effort: Pulmonary effort is normal.     Breath sounds: Wheezing and rhonchi present. No rales.  Abdominal:     Palpations: Abdomen is soft.  Musculoskeletal:     Cervical back: Neck supple.     Right lower leg: No edema.     Left lower leg: No edema.  Skin:    Findings: No rash.  Neurological:     Mental Status: She is alert. Mental status is at baseline.  Psychiatric:        Mood and Affect: Mood normal.     ED Results / Procedures / Treatments   Labs (all labs ordered are listed, but only abnormal results are displayed) Labs Reviewed  CBC WITH DIFFERENTIAL/PLATELET - Abnormal; Notable for the following components:      Result Value   WBC 13.0 (*)    Hemoglobin 15.7 (*)    Neutro Abs 9.6 (*)    Monocytes Absolute 1.2 (*)    All other components within normal limits  BASIC METABOLIC PANEL - Abnormal; Notable for the following components:   Sodium 130 (*)    Chloride 94 (*)    Glucose, Bld 369 (*)    Calcium 8.6 (*)    All other components within normal limits  RESP PANEL BY RT-PCR (FLU A&B, COVID) [id number]  LACTIC ACID, PLASMA  LACTIC ACID, PLASMA  TROPONIN I (HIGH SENSITIVITY)  TROPONIN I (HIGH SENSITIVITY)    EKG None  ED ECG REPORT   Date: 03/09/2021  Rate: 107  Rhythm: sinus tachycardia  QRS Axis: right  Intervals: normal  ST/T Wave abnormalities: normal  Conduction Disutrbances:none  Narrative Interpretation:   Old EKG Reviewed: unchanged  I have personally reviewed the EKG tracing and agree with the computerized printout as noted.   Radiology DG Chest 2 View  Result Date: 03/09/2021 CLINICAL DATA:  Cough EXAM: CHEST - 2 VIEW COMPARISON:  April 16, 2018 FINDINGS: The cardiomediastinal silhouette is unchanged in contour. No pleural effusion. No pneumothorax. Faint reticulonodular opacities at the RIGHT lung  base. Mild peribronchial cuffing, increased in comparison to prior. Surgical clips project over the upper abdomen. No acute osseous abnormality. IMPRESSION: 1. Faint reticulonodular opacities at the RIGHT lung base. Differential considerations include infection, aspiration or atelectasis. Followup PA and lateral chest X-ray is recommended in 3-4 weeks following trial of antibiotic therapy to ensure resolution and exclude underlying malignancy. 2. Mild bronchial wall thickening as can be seen in bronchitis or small airways disease. Electronically Signed   By: Meda Klinefelter MD   On: 03/09/2021 12:55    Procedures .Critical Care Performed by: Fayrene Helper, PA-C Authorized by: Fayrene Helper, PA-C   Critical care provider statement:    Critical care time (minutes):  40   Critical care was time spent personally by me on the following activities:  Discussions with consultants, evaluation of patient's response to treatment, examination of patient, ordering and performing treatments and interventions, ordering and review of laboratory studies, ordering and review of radiographic studies, pulse oximetry, re-evaluation of patient's condition, obtaining history from patient or surrogate and review of old charts     Medications Ordered in ED Medications  magnesium sulfate IVPB 2 g 50 mL (2 g Intravenous New Bag/Given 03/09/21 1349)  vancomycin (VANCOREADY) IVPB 1750 mg/350 mL (has no administration in time range)  ceFEPIme (MAXIPIME) 2 g in sodium chloride 0.9 % 100 mL IVPB (has no administration in time range)  vancomycin (VANCOREADY)  IVPB 1500 mg/300 mL (has no administration in time range)  methylPREDNISolone sodium succinate (SOLU-MEDROL) 125 mg/2 mL injection 125 mg (125 mg Intravenous Given 03/09/21 1344)  albuterol (VENTOLIN HFA) 108 (90 Base) MCG/ACT inhaler 6 puff (6 puffs Inhalation Given 03/09/21 1319)  AeroChamber Plus Flo-Vu Large MISC 1 each (1 each Other Given 03/09/21 1320)    ED Course  I  have reviewed the triage vital signs and the nursing notes.  Pertinent labs & imaging results that were available during my care of the patient were reviewed by me and considered in my medical decision making (see chart for details).    MDM Rules/Calculators/A&P                          BP 116/70   Pulse 100   Temp 98.3 F (36.8 C) (Oral)   Resp 19   SpO2 93%   Final Clinical Impression(s) / ED Diagnoses Final diagnoses:  Community acquired pneumonia of right lower lobe of lung    Rx / DC Orders ED Discharge Orders    None     12:57 PM Patient without any symptom history of COPD or asthma but does used tobacco products.  She is here with recurrent lung infection which has been ongoing for the past 3 to 4 months has been on multiple dose of antibiotics as well as steroid.  She is here with worsening of her symptoms.  She does have wheezes and rhonchi heard on exam, and is hypoxic with O2 sats at 85% on room air.  Patient placed on nasal cannula, will provide additional breathing treatment but anticipate admission for further care.  2:02 PM Chest x-ray shows a faint reticular opacity concerning for potential infection.  Elevated WBC 13, elevated CBG of 369, resp panel pending.  I have initiated abx.  Pt given treatment for her sob, appreciate consultation with Dr. Allena Katz who agrees to see and will admit pt.    [name] was evaluated in Emergency Department on 03/09/2021 for the symptoms described in the history of present illness. She was evaluated in the context of the global COVID-19 pandemic, which necessitated consideration that the patient might be at risk for infection with the SARS-CoV-2 virus that causes COVID-19. Institutional protocols and algorithms that pertain to the evaluation of patients at risk for COVID-19 are in a state of rapid change based on information released by regulatory bodies including the CDC and federal and state organizations. These policies  and algorithms were followed during the patient's care in the ED.    Fayrene Helper, PA-C 03/09/21 1435    Cheryll Cockayne, MD 03/09/21 1538

## 2021-03-09 NOTE — H&P (Signed)
History and Physical    [name] KPT:[id number] DOB: 05/08/1981 DOA: 03/09/2021  PCP: April Manson, NP    Patient coming from:  Home    Chief Complaint:  SOB.   HPI: [name] is a 40 y.o. female seen in ed with complaints of sob, cough, fever, presenting with sob off and on since February with rec infection of lungs.This episode is about 2 weeks. Assoc with  SOB< emesis, wheezing, cough.Pt has had abx and steroids.Pt states that she has been tested for covid and flu and has been negative. Pt denies any travel or miscarriages or leg swelling or clotting disorders.  Pt has past medical history of  Anxiety, CAP, Back pain, Depression, DM II,Tobacco abuse, alcohol abuse.  ED Course:  Vitals:   03/09/21 1315 03/09/21 1330 03/09/21 1345 03/09/21 1400  BP:   116/70 113/76  Pulse:   100 (!) 112  Resp: (!) 21 19 19 20   Temp:    98 F (36.7 C)  TempSrc:    Oral  SpO2:   93% 100%  In ed pt is alert,awake and oriented, afebrile and hypoxic with normal sats with 2L [place]. Labs show Mild hyponatremia of 130, glucose of 369, normal creatinine. Cbc shows wbc count of 13, hb of 15.7.Covid/Flu are negative. In ED pt received aztreonam and vancomycin.   Review of Systems:  Review of Systems  Constitutional: Positive for chills, fever and malaise/fatigue.  HENT: Positive for sore throat.   Eyes: Negative.   Respiratory: Positive for cough and sputum production.   Gastrointestinal: Negative.   Genitourinary: Negative.   Musculoskeletal: Positive for myalgias.  Neurological: Negative.   Endo/Heme/Allergies: Negative.      Past Medical History:  Diagnosis Date  . Anxiety   . Back pain   . CAP (community acquired pneumonia)   . Chronic pain syndrome   . Depression   . Diabetes mellitus without complication (HCC)   . Gallbladder problem   . Obsessive compulsive disorder     Past Surgical History:  Procedure Laterality Date  . CESAREAN SECTION     . CHOLECYSTECTOMY  2009  . wisdom teeth extraction      reports that she has been smoking cigarettes. She has a 11.50 pack-year smoking history. She has never used smokeless tobacco. She reports current alcohol use. She reports that she does not use drugs.  Allergies  Allergen Reactions  . Orange Oil Shortness Of Breath  . Penicillins Itching and Rash  . Carisoprodol Other (See Comments)    Skin peeled   . Piroxicam Other (See Comments)    Skin peeled    Family History  Problem Relation Age of Onset  . Diabetes insipidus Father   . Arthritis Father   . Heart failure Father   . COPD Father   . Emphysema Father   . Asthma Son     Prior to Admission medications   Medication Sig Start Date End Date Taking? Authorizing Provider  albuterol (PROVENTIL HFA;VENTOLIN HFA) 108 (90 Base) MCG/ACT inhaler Inhale 1-2 puffs into the lungs every 6 (six) hours as needed for wheezing or shortness of breath. 04/17/18   06/18/18, PA-C  amphetamine-dextroamphetamine (ADDERALL) 30 MG tablet Take by mouth. 01/21/21   [provider]  amphetamine-dextroamphetamine (ADDERALL) 30 MG tablet Take 1 tablet by mouth 2 (two) times daily. 09/28/20   [provider]  ARIPiprazole (ABILIFY) 10 MG tablet Take 1 tablet by mouth daily. 01/21/21   [provider]  ARIPiprazole (ABILIFY) 5 MG tablet Take 1 tablet (5 mg total) by mouth daily. For major depressive disorder. 06/13/13   Tamala Julian, PA-C  aspirin EC 81 MG tablet Take 1 tablet (81 mg total) by mouth daily. 05/10/17   Bing Neighbors, FNP  baclofen (LIORESAL) 10 MG tablet Take 10 mg by mouth 3 (three) times daily. 09/29/20   [provider]  BD PEN NEEDLE NANO 2ND GEN 32G X 4 MM MISC daily. 09/23/20   [provider]  buprenorphine (BUTRANS) 10 MCG/HR PTWK 1 patch once a week. 10/08/20   [provider]  busPIRone (BUSPAR) 15 MG tablet TAKE 1 TABLET(15 MG) BY MOUTH THREE TIMES DAILY 01/08/21    [provider]  busPIRone (BUSPAR) 15 MG tablet Take 15 mg by mouth 3 (three) times daily. 10/08/20   [provider]  cyclobenzaprine (FLEXERIL) 10 MG tablet cyclobenzaprine 10 mg tablet    [provider]  dexamethasone (DECADRON) 4 MG tablet Take 1 tablet (4 mg total) by mouth 2 (two) times daily with a meal. 04/17/18   Ivery Quale, PA-C  diazepam (VALIUM) 5 MG tablet Take one po at the time of the MRI after signing forms may repeat in 30 minutes 09/10/20   Kerrin Champagne, MD  Empagliflozin-metFORMIN HCl (SYNJARDY) 12.02-999 MG TABS Take by mouth. 02/12/21   [provider]  ezetimibe (ZETIA) 10 MG tablet Take 1 tablet (10 mg total) by mouth daily. 09/26/17   Bing Neighbors, FNP  ezetimibe (ZETIA) 10 MG tablet Take 1 tablet by mouth daily. 01/21/21   [provider]  gabapentin (NEURONTIN) 100 MG capsule Take 1 capsule (100 mg total) by mouth daily after breakfast. 02/14/20   Kerrin Champagne, MD  gabapentin (NEURONTIN) 100 MG capsule Take 1 capsule by mouth daily. 01/21/21   [provider]  gabapentin (NEURONTIN) 300 MG capsule Take 1-2 capsules (300-600 mg total) by mouth at bedtime. Only take as prescribed.  Husband is to administer medication 02/14/20   Kerrin Champagne, MD  gabapentin (NEURONTIN) 300 MG capsule Take by mouth. 01/21/21   [provider]  glipiZIDE (GLUCOTROL) 10 MG tablet glipizide 10 mg tablet  TAKE 1 TABLET BY MOUTH DAILY BEFORE BREAKFAST    [provider]  HYDROcodone-acetaminophen (NORCO/VICODIN) 5-325 MG tablet hydrocodone 5 mg-acetaminophen 325 mg tablet    [provider]  Insulin Glargine (BASAGLAR KWIKPEN) 100 UNIT/ML SMARTSIG:15 Unit(s) SUB-Q Every Evening 09/23/20   [provider]  insulin glargine (LANTUS SOLOSTAR) 100 UNIT/ML Solostar Pen Lantus Solostar U-100 Insulin 100 unit/mL (3 mL) subcutaneous pen  INJECT 10 UNITS INTO THE SKIN DAILY AT 10 PM    [provider]   lidocaine (XYLOCAINE) 2 % solution SMARTSIG:5 Milliliter(s) By Mouth PRN 10/16/20   [provider]  lisinopril (PRINIVIL,ZESTRIL) 2.5 MG tablet Take 1 tablet (2.5 mg total) by mouth daily. 05/10/17   Bing Neighbors, FNP  metaxalone (SKELAXIN) 800 MG tablet Take 800 mg by mouth 3 (three) times daily as needed. 10/08/20   [provider]  metFORMIN (GLUCOPHAGE) 1000 MG tablet Take 0.5 tablets (500 mg total) by mouth 2 (two) times daily with a meal. 02/14/20 08/12/20  Kerrin Champagne, MD  metFORMIN (GLUCOPHAGE) 1000 MG tablet metformin 1,000 mg tablet  TAKE 1 TABLET BY MOUTH TWICE DAILY WITH A MEAL    [provider]  mupirocin ointment (BACTROBAN) 2 % Apply to affected area 3 times daily 01/21/21 01/21/22  [provider]  nalbuphine (NUBAIN) 20 MG/ML injection nalbuphine 20 mg/mL injection solution  Take 20 mg every day by injection route for 1 day.    [provider]  ondansetron (ZOFRAN) 4 MG tablet Take by mouth. 09/11/20   [provider]  ondansetron (ZOFRAN) 4 MG tablet Take 4 mg by mouth 3 (three) times daily as needed. 09/29/20   [provider]  oxyCODONE (ROXICODONE) 15 MG immediate release tablet Take 15 mg by mouth 3 (three) times daily as needed. 03/15/20   [provider]  oxyCODONE-acetaminophen (PERCOCET/ROXICET) 5-325 MG tablet oxycodone-acetaminophen 5 mg-325 mg tablet    [provider]  pravastatin (PRAVACHOL) 80 MG tablet Take 1 tablet (80 mg total) by mouth daily. Patient taking differently: Take 80 mg by mouth every morning.  05/11/17   Bing NeighborsHarris, Kimberly S, FNP  predniSONE (DELTASONE) 20 MG tablet prednisone 20 mg tablet    [provider]  promethazine (PHENERGAN) 25 MG/ML injection promethazine 25 mg/mL injection solution  Take 25 mg every day by injection route for 1 day.    [provider]  Semaglutide, 1 MG/DOSE, (OZEMPIC, 1 MG/DOSE,) 4 MG/3ML SOPN Inject 1 mg into the skin once a  week. 02/12/21   [provider]  sertraline (ZOLOFT) 100 MG tablet Take 2 tablets (200 mg total) by mouth daily. For depression and anxiety. 06/13/13   Tamala JulianMashburn, Neil T, PA-C  traZODone (DESYREL) 100 MG tablet Take 200 mg by mouth at bedtime. For insomnia.  06/13/13   Tamala JulianMashburn, Neil T, PA-C  traZODone (DESYREL) 100 MG tablet Take 2 tablets by mouth at bedtime. 01/21/21   [provider]    Physical Exam: Vitals:   03/09/21 1315 03/09/21 1330 03/09/21 1345 03/09/21 1400  BP:   116/70 113/76  Pulse:   100 (!) 112  Resp: (!) 21 19 19 20   Temp:    98 F (36.7 C)  TempSrc:    Oral  SpO2:   93% 100%   Physical Exam Vitals and nursing note reviewed.  Constitutional:      General: She is not in acute distress.    Appearance: Normal appearance. She is obese. She is ill-appearing. She is not toxic-appearing or diaphoretic.  HENT:     Head: Normocephalic and atraumatic.     Right Ear: External ear normal.     Left Ear: External ear normal.     Nose: Nose normal.     Mouth/Throat:     Mouth: Mucous membranes are moist.  Eyes:     Extraocular Movements: Extraocular movements intact.     Pupils: Pupils are equal, round, and reactive to light.  Cardiovascular:     Rate and Rhythm: Regular rhythm. Tachycardia present.     Pulses: Normal pulses.     Heart sounds: Normal heart sounds.  Pulmonary:     Effort: Pulmonary effort is normal.     Breath sounds: Wheezing present.  Abdominal:     General: Bowel sounds are normal. There is no distension.     Palpations: Abdomen is soft.     Tenderness: There is no abdominal tenderness. There is no guarding.  Musculoskeletal:     Right lower leg: No edema.     Left lower leg: No edema.  Skin:    General: Skin is warm.  Neurological:     General: No focal deficit present.     Mental Status: She is alert and oriented to person, place, and time.  Psychiatric:  Mood and Affect: Mood normal.        Behavior: Behavior normal.      Labs on Admission: I have personally reviewed following labs and imaging studies  No results for input(s): CKTOTAL, CKMB, TROPONINI in the last 72 hours. Lab Results  Component Value Date   WBC 13.0 (H) 03/09/2021   HGB 15.7 (H) 03/09/2021   HCT 45.2 03/09/2021   MCV 88.8 03/09/2021   PLT 326 03/09/2021    Recent Labs  Lab 03/09/21 1220  NA 130*  K 3.9  CL 94*  CO2 24  BUN 7  CREATININE 0.54  CALCIUM 8.6*  GLUCOSE 369*   Urinalysis    Component Value Date/Time   COLORURINE STRAW (A) 02/18/2021 0137   APPEARANCEUR CLEAR 02/18/2021 0137   LABSPEC 1.030 02/18/2021 0137   PHURINE 6.0 02/18/2021 0137   GLUCOSEU >=500 (A) 02/18/2021 0137   HGBUR NEGATIVE 02/18/2021 0137   HGBUR negative 12/26/2008 1435   BILIRUBINUR NEGATIVE 02/18/2021 0137   KETONESUR NEGATIVE 02/18/2021 0137   PROTEINUR NEGATIVE 02/18/2021 0137   UROBILINOGEN 0.2 01/09/2018 1114   NITRITE NEGATIVE 02/18/2021 0137   LEUKOCYTESUR NEGATIVE 02/18/2021 0137    COVID-19 Labs Negative Radiological Exams on Admission: DG Chest 2 View  Result Date: 03/09/2021 CLINICAL DATA:  Cough EXAM: CHEST - 2 VIEW COMPARISON:  April 16, 2018 FINDINGS: The cardiomediastinal silhouette is unchanged in contour. No pleural effusion. No pneumothorax. Faint reticulonodular opacities at the RIGHT lung base. Mild peribronchial cuffing, increased in comparison to prior. Surgical clips project over the upper abdomen. No acute osseous abnormality. IMPRESSION: 1. Faint reticulonodular opacities at the RIGHT lung base. Differential considerations include infection, aspiration or atelectasis. Followup PA and lateral chest X-ray is recommended in 3-4 weeks following trial of antibiotic therapy to ensure resolution and exclude underlying malignancy. 2. Mild bronchial wall thickening as can be seen in bronchitis or small airways disease. Electronically Signed   By: Meda Klinefelter MD   On: 03/09/2021 12:55    EKG:  Sinus tachycardia  107, RAE and no st changes.   Echocardiogram: Pending.   Assessment/Plan Principal Problem:   Respiratory failure with hypoxia (HCC) Active Problems:   TOBACCO ABUSE   Alcohol abuse, episodic drinking behavior   Type 2 diabetes mellitus with hyperglycemia (HCC)   Respiratory failure with hypoxia: Will admit to progressive unit with cont cardiac and pulse oximetry monitoring. Will empirically t/t for COPD/ PNA  With steroids, nebulizer, MDI and abx. Will cont with supplemental oxygen and obtain 2 d echo. Low threshold for CT Chest without contrast as pt is hypoxic and d-dimer negative.  Tobacco Abuse: Nicotine patch  And counseling when is stable and off oxygen.  Alcohol abuse: Patient denies alcohol abuse, per report only occasional alcohol consumption.  DM II: Ssi/ a1c/ accucheck/ Hold home regimen of Synjardy.  Hyperlipidemia: Patient continued on her Zetia, pravastatin.  Depression: Patient continued on her BuSpar, Zoloft.   DVT prophylaxis:  Heparin.  Code Status:  Full Code.   Family Communication:  Setterlund,David (Spouse).  [phone] (Home Phone).  Disposition Plan:  Home.  Consults called:  None.  Admission status: Inpatient.     Gertha Calkin MD [facility] [phone] How to contact the [facility] Attending or Consulting provider 7A - 7P or covering provider during after hours 7P -7A, for this patient.    1. Check the care team in [facility] and look for a) attending/consulting [facility] provider listed and b) the [facility] team listed 2. Log into  [email or web address] and use [place]'s universal password to access. If you do not have the password, please contact the hospital operator. 3. Locate the [facility] provider you are looking for under [facility] and page to a number that you can be directly reached. 4. If you still have difficulty reaching the provider, please page the [facility] (Director on Call) for the Hospitalists listed on amion for  assistance. [email or web address] Password [facility] 03/09/2021, 2:49 PM

## 2021-03-10 ENCOUNTER — Inpatient Hospital Stay (HOSPITAL_COMMUNITY): Payer: Managed Care, Other (non HMO)

## 2021-03-10 DIAGNOSIS — E1165 Type 2 diabetes mellitus with hyperglycemia: Secondary | ICD-10-CM | POA: Diagnosis not present

## 2021-03-10 DIAGNOSIS — R0602 Shortness of breath: Secondary | ICD-10-CM | POA: Diagnosis not present

## 2021-03-10 DIAGNOSIS — F17200 Nicotine dependence, unspecified, uncomplicated: Secondary | ICD-10-CM | POA: Diagnosis not present

## 2021-03-10 DIAGNOSIS — J9601 Acute respiratory failure with hypoxia: Secondary | ICD-10-CM | POA: Diagnosis not present

## 2021-03-10 LAB — RESPIRATORY PANEL BY PCR

## 2021-03-10 LAB — COMPREHENSIVE METABOLIC PANEL
ALT: 15 U/L (ref 0–44)
AST: 14 U/L — ABNORMAL LOW (ref 15–41)
Albumin: 2.6 g/dL — ABNORMAL LOW (ref 3.5–5.0)
Alkaline Phosphatase: 102 U/L (ref 38–126)
Anion gap: 9 (ref 5–15)
BUN: 10 mg/dL (ref 6–20)
CO2: 26 mmol/L (ref 22–32)
Calcium: 8.6 mg/dL — ABNORMAL LOW (ref 8.9–10.3)
Chloride: 97 mmol/L — ABNORMAL LOW (ref 98–111)
Creatinine, Ser: 0.65 mg/dL (ref 0.44–1.00)
GFR, Estimated: >60 mL/min (ref >60)
Glucose, Bld: 437 mg/dL — ABNORMAL HIGH (ref 70–99)
Potassium: 4.7 mmol/L (ref 3.5–5.1)
Sodium: 132 mmol/L — ABNORMAL LOW (ref 135–145)
Total Bilirubin: 0.4 mg/dL (ref 0.3–1.2)
Total Protein: 6 g/dL — ABNORMAL LOW (ref 6.5–8.1)

## 2021-03-10 LAB — CBC
HCT: 44.9 % (ref 36.0–46.0)
Hemoglobin: 15 g/dL (ref 12.0–15.0)
MCH: 30.1 pg (ref 26.0–34.0)
MCHC: 33.4 g/dL (ref 30.0–36.0)
MCV: 90.2 fL (ref 80.0–100.0)
Platelets: 363 10*3/uL (ref 150–400)
RBC: 4.98 MIL/uL (ref 3.87–5.11)
RDW: 13.6 % (ref 11.5–15.5)
WBC: 12.9 10*3/uL — ABNORMAL HIGH (ref 4.0–10.5)
nRBC: 0 % (ref 0.0–0.2)

## 2021-03-10 LAB — ECHOCARDIOGRAM COMPLETE
AR max vel: 1.98 cm2
AV Area VTI: 2.04 cm2
AV Area mean vel: 1.96 cm2
AV Mean grad: 6 mmHg
AV Peak grad: 11.7 mmHg
Ao pk vel: 1.71 m/s
Area-P 1/2: 4.57 cm2
Height: 66 in
S' Lateral: 2.9 cm
Weight: 2936 oz

## 2021-03-10 LAB — GLUCOSE, CAPILLARY
Glucose-Capillary: 329 mg/dL — ABNORMAL HIGH (ref 70–99)
Glucose-Capillary: 338 mg/dL — ABNORMAL HIGH (ref 70–99)
Glucose-Capillary: 210 mg/dL — ABNORMAL HIGH (ref 70–99)
Glucose-Capillary: 407 mg/dL — ABNORMAL HIGH (ref 70–99)

## 2021-03-10 LAB — HEMOGLOBIN A1C
Hgb A1c MFr Bld: 13.9 % — ABNORMAL HIGH (ref 4.8–5.6)
Mean Plasma Glucose: 352 mg/dL

## 2021-03-10 LAB — MRSA PCR SCREENING: MRSA by PCR: NEGATIVE

## 2021-03-10 LAB — PROCALCITONIN: Procalcitonin: <0.1 ng/mL

## 2021-03-10 MED ORDER — IPRATROPIUM-ALBUTEROL 0.5-2.5 (3) MG/3ML IN SOLN: 3.0000 mL | Freq: Two times a day (BID) | RESPIRATORY_TRACT | Status: DC

## 2021-03-10 MED ORDER — TRAZODONE HCL 100 MG PO TABS
200.0000 mg | ORAL_TABLET | Freq: Every day | ORAL | Status: DC
  Administered 2021-03-10: 200 mg via ORAL
  Filled 2021-03-10: qty 2

## 2021-03-10 MED ORDER — INSULIN DETEMIR 100 UNIT/ML ~~LOC~~ SOLN
10.0000 [IU] | Freq: Every day | SUBCUTANEOUS | Status: DC
  Administered 2021-03-10: 10 [IU] via SUBCUTANEOUS
  Filled 2021-03-10: qty 0.1

## 2021-03-10 MED ORDER — IPRATROPIUM-ALBUTEROL 0.5-2.5 (3) MG/3ML IN SOLN
3.0000 mL | Freq: Two times a day (BID) | RESPIRATORY_TRACT | Status: DC
  Administered 2021-03-10 (×2): 3 mL via RESPIRATORY_TRACT
  Filled 2021-03-10 (×2): qty 3

## 2021-03-10 MED ORDER — EZETIMIBE 10 MG PO TABS
10.0000 mg | ORAL_TABLET | Freq: Every day | ORAL | Status: DC
  Administered 2021-03-10 – 2021-03-11 (×2): 10 mg via ORAL
  Filled 2021-03-10 (×2): qty 1

## 2021-03-10 MED ORDER — INSULIN DETEMIR 100 UNIT/ML ~~LOC~~ SOLN
10.0000 [IU] | Freq: Two times a day (BID) | SUBCUTANEOUS | Status: DC
  Administered 2021-03-10 – 2021-03-11 (×2): 10 [IU] via SUBCUTANEOUS
  Filled 2021-03-10 (×3): qty 0.1

## 2021-03-10 MED ORDER — INSULIN ASPART 100 UNIT/ML IJ SOLN: 0.0000 [IU] | Freq: Every day | INTRAMUSCULAR | Status: DC

## 2021-03-10 MED ORDER — GABAPENTIN 300 MG PO CAPS
600.0000 mg | ORAL_CAPSULE | Freq: Every day | ORAL | Status: DC
  Administered 2021-03-10: 600 mg via ORAL
  Filled 2021-03-10: qty 2

## 2021-03-10 MED ORDER — INSULIN ASPART 100 UNIT/ML IJ SOLN
0.0000 [IU] | Freq: Three times a day (TID) | INTRAMUSCULAR | Status: DC
  Administered 2021-03-10: 7 [IU] via SUBCUTANEOUS
  Administered 2021-03-11: 20 [IU] via SUBCUTANEOUS
  Administered 2021-03-11: 7 [IU] via SUBCUTANEOUS

## 2021-03-10 MED ORDER — IPRATROPIUM-ALBUTEROL 0.5-2.5 (3) MG/3ML IN SOLN
3.0000 mL | Freq: Two times a day (BID) | RESPIRATORY_TRACT | Status: DC
  Administered 2021-03-11: 3 mL via RESPIRATORY_TRACT
  Filled 2021-03-10: qty 3

## 2021-03-10 MED ORDER — METHYLPREDNISOLONE SODIUM SUCC 125 MG IJ SOLR
60.0000 mg | Freq: Two times a day (BID) | INTRAMUSCULAR | Status: DC
  Administered 2021-03-10 – 2021-03-11 (×2): 60 mg via INTRAVENOUS
  Filled 2021-03-10 (×2): qty 2

## 2021-03-10 MED ORDER — SERTRALINE HCL 100 MG PO TABS
200.0000 mg | ORAL_TABLET | Freq: Every day | ORAL | Status: DC
  Administered 2021-03-10 – 2021-03-11 (×2): 200 mg via ORAL
  Filled 2021-03-10 (×2): qty 2

## 2021-03-10 MED ORDER — PRAVASTATIN SODIUM 40 MG PO TABS
80.0000 mg | ORAL_TABLET | Freq: Every morning | ORAL | Status: DC
  Administered 2021-03-10 – 2021-03-11 (×2): 80 mg via ORAL
  Filled 2021-03-10 (×2): qty 2

## 2021-03-10 MED ORDER — ARIPIPRAZOLE 10 MG PO TABS
10.0000 mg | ORAL_TABLET | Freq: Every morning | ORAL | Status: DC
  Administered 2021-03-10 – 2021-03-11 (×2): 10 mg via ORAL
  Filled 2021-03-10 (×3): qty 1

## 2021-03-10 MED ORDER — GABAPENTIN 100 MG PO CAPS
100.0000 mg | ORAL_CAPSULE | Freq: Every morning | ORAL | Status: DC
  Administered 2021-03-10 – 2021-03-11 (×2): 100 mg via ORAL
  Filled 2021-03-10 (×2): qty 1

## 2021-03-10 MED ORDER — INSULIN ASPART 100 UNIT/ML IJ SOLN
15.0000 [IU] | Freq: Once | INTRAMUSCULAR | Status: AC
  Administered 2021-03-10: 15 [IU] via SUBCUTANEOUS

## 2021-03-10 MED ORDER — ACETAMINOPHEN 325 MG PO TABS: 650.0000 mg | ORAL_TABLET | Freq: Four times a day (QID) | ORAL | Status: DC | PRN

## 2021-03-10 NOTE — Plan of Care (Signed)
  Problem: Coping: Goal: Level of anxiety will decrease Outcome: Progressing   Problem: Elimination: Goal: Will not experience complications related to urinary retention Outcome: Progressing   Problem: Pain Managment: Goal: General experience of comfort will improve Outcome: Progressing   

## 2021-03-10 NOTE — Plan of Care (Signed)
  Problem: Education: Goal: Knowledge of General Education information will improve Description: Including pain rating scale, medication(s)/side effects and non-pharmacologic comfort measures Outcome: Progressing   Problem: Health Behavior/Discharge Planning: Goal: Ability to manage health-related needs will improve Outcome: Progressing   Problem: Clinical Measurements: Goal: Respiratory complications will improve Outcome: Progressing   

## 2021-03-10 NOTE — Progress Notes (Signed)
OT Cancellation Note  Patient Details Name: [name] MRN: [id number] DOB: [birth year]   Cancelled Treatment:    Reason Eval/Treat Not Completed: OT screened, no needs identified, will sign off Boris Engelmann OTR/L acute rehab services Office: [phone]  03/10/2021, 11:58 AM

## 2021-03-10 NOTE — Progress Notes (Signed)
RN contacted MD for BS of 407. Verbal orders given to administered scheduled dose of levemir & 15u novolog. Recheck in 2 hours.

## 2021-03-10 NOTE — Progress Notes (Signed)
PROGRESS NOTE  [name]  IWL:[id number] DOB: November 29, 1980 DOA: 03/09/2021 PCP: April Manson, NP   Brief Narrative: [name] is a 40 y.o. female with a history of T2DM, anxiety and recurrent pneumonia/bronchitis this year who presented to the ED 5/30 with wheezing, dyspnea and cough associated with hyperglycemia. She was hypoxic with faint reticular opacities on CXR, WBC 13k. Subsequent CT chest revealed scattered GGOs bilaterally. Vancomycin, cefepime, and azithromycin were given with solumedrol and bronchodilators on admission.   Assessment & Plan: Principal Problem:   Respiratory failure with hypoxia (HCC) Active Problems:   TOBACCO ABUSE   Alcohol abuse, episodic drinking behavior   Type 2 diabetes mellitus with hyperglycemia (HCC)  Acute hypoxic respiratory failure due to bilateral CAP: Suspect atypical and/or viral etiology. Negative influenza, covid on admission.  - Check PCT and RVP. Low threshold to deescalate abx.  - Wean oxygen today  T2DM: Dx a few years ago, lost weight and came off insulin but CBGs elevated for weeks during this acute illness. Uncontrolled with hyperglycemia exacerbated by steroids here.  - Deescalate steroids since wheezing improved.  - Add levemir, augment SSI, monitor AC/HS.  - Holding home po medications.   Tobacco use:  - Cessation counseling - Nicotine patch  Anxiety, depression:  - Reordered home medications  HLD:  - Continue statin, zetia.  DVT prophylaxis: Lovenox Code Status: Full Family Communication: None at bedside Disposition Plan:  Status is: Inpatient  Remains inpatient appropriate because:Inpatient level of care appropriate due to severity of illness   Dispo: The patient is from: Home              Anticipated d/c is to: Home              Patient currently is not medically stable to d/c.   Difficult to place patient No  Consultants:   None  Procedures:    None  Antimicrobials:  Vancomycin, cefepime, azithromycin   Subjective: Breathing much better than at admission, still dyspneic on exertion with cough that is providing more relief. Wheezing improved. Still on oxygen.   Objective: Vitals:   03/09/21 2256 03/10/21 0711 03/10/21 0747 03/10/21 1158  BP: 129/89  126/71 104/61  Pulse: 98  100 100  Resp: 20  20 12   Temp: 97.8 F (36.6 C)  98.6 F (37 C) 98.4 F (36.9 C)  TempSrc: Oral  Oral Oral  SpO2:  94% 94% 91%  Weight:      Height:        Intake/Output Summary (Last 24 hours) at 03/10/2021 1217 Last data filed at 03/09/2021 1532 Gross per 24 hour  Intake 50 ml  Output --  Net 50 ml   Filed Weights   03/09/21 1639 03/09/21 1840  Weight: 83.9 kg 83.2 kg    Gen: 40 y.o. female in no distress Pulm: Non-labored breathing supplemental oxygen, rhonchi noted bilaterally, tachypneic.  CV: Regular rate and rhythm. No murmur, rub, or gallop. NO JVD, no pedal edema. GI: Abdomen soft, non-tender, non-distended, with normoactive bowel sounds. No organomegaly or masses felt. Ext: Warm, no deformities Skin: No rashes, lesions or ulcers Neuro: Alert and oriented. No focal neurological deficits. Psych: Judgement and insight appear normal. Mood & affect appropriate.   Data Reviewed: I have personally reviewed following labs and imaging studies  CBC: Recent Labs  Lab 03/09/21 1220 03/10/21 0144  WBC 13.0* 12.9*  NEUTROABS 9.6*  --   HGB 15.7* 15.0  HCT 45.2 44.9  MCV  88.8 90.2  PLT 326 363   Basic Metabolic Panel: Recent Labs  Lab 03/09/21 1220 03/10/21 0144  NA 130* 132*  K 3.9 4.7  CL 94* 97*  CO2 24 26  GLUCOSE 369* 437*  BUN 7 10  CREATININE 0.54 0.65  CALCIUM 8.6* 8.6*   GFR: Estimated Creatinine Clearance: 101.7 mL/min (by C-G formula based on SCr of 0.65 mg/dL). Liver Function Tests: Recent Labs  Lab 03/09/21 1532 03/10/21 0144  AST 14* 14*  ALT 17 15  ALKPHOS 103 102  BILITOT 0.7 0.4  PROT  6.2* 6.0*  ALBUMIN 2.8* 2.6*   No results for input(s): LIPASE, AMYLASE in the last 168 hours. No results for input(s): AMMONIA in the last 168 hours. Coagulation Profile: No results for input(s): INR, PROTIME in the last 168 hours. Cardiac Enzymes: No results for input(s): CKTOTAL, CKMB, CKMBINDEX, TROPONINI in the last 168 hours. BNP (last 3 results) No results for input(s): PROBNP in the last 8760 hours. HbA1C: Recent Labs    03/09/21 1532  HGBA1C 13.9*   CBG: Recent Labs  Lab 03/09/21 1646 03/09/21 1829 03/09/21 2307 03/10/21 0746 03/10/21 1154  GLUCAP 481* 370* 329* 329* 338*   Lipid Profile: No results for input(s): CHOL, HDL, LDLCALC, TRIG, CHOLHDL, LDLDIRECT in the last 72 hours. Thyroid Function Tests: No results for input(s): TSH, T4TOTAL, FREET4, T3FREE, THYROIDAB in the last 72 hours. Anemia Panel: No results for input(s): VITAMINB12, FOLATE, FERRITIN, TIBC, IRON, RETICCTPCT in the last 72 hours. Urine analysis:    Component Value Date/Time   COLORURINE STRAW (A) 02/18/2021 0137   APPEARANCEUR CLEAR 02/18/2021 0137   LABSPEC 1.030 02/18/2021 0137   PHURINE 6.0 02/18/2021 0137   GLUCOSEU >=500 (A) 02/18/2021 0137   HGBUR NEGATIVE 02/18/2021 0137   HGBUR negative 12/26/2008 1435   BILIRUBINUR NEGATIVE 02/18/2021 0137   KETONESUR NEGATIVE 02/18/2021 0137   PROTEINUR NEGATIVE 02/18/2021 0137   UROBILINOGEN 0.2 01/09/2018 1114   NITRITE NEGATIVE 02/18/2021 0137   LEUKOCYTESUR NEGATIVE 02/18/2021 7893   Recent Results (from the past 240 hour(s))  Resp Panel by RT-PCR (Flu A&B, Covid) Nasopharyngeal Swab     Status: None   Collection Time: 03/09/21  1:15 PM   Specimen: Nasopharyngeal Swab; Nasopharyngeal(NP) swabs in vial transport medium  Result Value Ref Range Status   SARS Coronavirus 2 by RT PCR NEGATIVE NEGATIVE Final    Comment: (NOTE) SARS-CoV-2 target nucleic acids are NOT DETECTED.  The SARS-CoV-2 RNA is generally detectable in upper  respiratory specimens during the acute phase of infection. The lowest concentration of SARS-CoV-2 viral copies this assay can detect is 138 copies/mL. A negative result does not preclude SARS-Cov-2 infection and should not be used as the sole basis for treatment or other patient management decisions. A negative result may occur with  improper specimen collection/handling, submission of specimen other than nasopharyngeal swab, presence of viral mutation(s) within the areas targeted by this assay, and inadequate number of viral copies(<138 copies/mL). A negative result must be combined with clinical observations, patient history, and epidemiological information. The expected result is Negative.  Fact Sheet for Patients:  BloggerCourse.com  Fact Sheet for Healthcare Providers:  SeriousBroker.it  This test is no t yet approved or cleared by the [place] FDA and  has been authorized for detection and/or diagnosis of SARS-CoV-2 by FDA under an Emergency Use Authorization (EUA). This EUA will remain  in effect (meaning this test can be used) for the duration of the COVID-19 declaration under Section 564(b)(1)  of the Act, 21 U.S.C.section 360bbb-3(b)(1), unless the authorization is terminated  or revoked sooner.       Influenza A by PCR NEGATIVE NEGATIVE Final   Influenza B by PCR NEGATIVE NEGATIVE Final    Comment: (NOTE) The Xpert Xpress SARS-CoV-2/FLU/RSV plus assay is intended as an aid in the diagnosis of influenza from Nasopharyngeal swab specimens and should not be used as a sole basis for treatment. Nasal washings and aspirates are unacceptable for Xpert Xpress SARS-CoV-2/FLU/RSV testing.  Fact Sheet for Patients: BloggerCourse.comhttps://www.fda.gov/media/152166/download  Fact Sheet for Healthcare Providers: SeriousBroker.ithttps://www.fda.gov/media/152162/download  This test is not yet approved or cleared by the [place] FDA and has been  authorized for detection and/or diagnosis of SARS-CoV-2 by FDA under an Emergency Use Authorization (EUA). This EUA will remain in effect (meaning this test can be used) for the duration of the COVID-19 declaration under Section 564(b)(1) of the Act, 21 U.S.C. section 360bbb-3(b)(1), unless the authorization is terminated or revoked.  Performed at [facility], [address] [place], [place] [address]   Respiratory (~20 pathogens) panel by PCR     Status: Abnormal   Collection Time: 03/10/21  9:54 AM   Specimen: Nasopharyngeal Swab; Respiratory  Result Value Ref Range Status   Adenovirus NOT DETECTED NOT DETECTED Final   Coronavirus 229E NOT DETECTED NOT DETECTED Final    Comment: (NOTE) The Coronavirus on the Respiratory Panel, DOES NOT test for the novel  Coronavirus (2019 nCoV)    Coronavirus HKU1 NOT DETECTED NOT DETECTED Final   Coronavirus NL63 NOT DETECTED NOT DETECTED Final   Coronavirus OC43 NOT DETECTED NOT DETECTED Final   Metapneumovirus NOT DETECTED NOT DETECTED Final   Rhinovirus / Enterovirus NOT DETECTED NOT DETECTED Final   Influenza A NOT DETECTED NOT DETECTED Final   Influenza B NOT DETECTED NOT DETECTED Final   Parainfluenza Virus 1 NOT DETECTED NOT DETECTED Final   Parainfluenza Virus 2 NOT DETECTED NOT DETECTED Final   Parainfluenza Virus 3 NOT DETECTED NOT DETECTED Final   Parainfluenza Virus 4 DETECTED (A) NOT DETECTED Final   Respiratory Syncytial Virus NOT DETECTED NOT DETECTED Final   Bordetella pertussis NOT DETECTED NOT DETECTED Final   Bordetella Parapertussis NOT DETECTED NOT DETECTED Final   Chlamydophila pneumoniae NOT DETECTED NOT DETECTED Final   Mycoplasma pneumoniae NOT DETECTED NOT DETECTED Final    Comment: Performed at [facility], [address] [place], [place] [address]  MRSA PCR Screening     Status: None   Collection Time: 03/10/21  9:54 AM   Specimen: Nasopharyngeal  Result Value Ref Range Status   MRSA by PCR  NEGATIVE NEGATIVE Final    Comment:        The GeneXpert MRSA Assay (FDA approved for NASAL specimens only), is one component of a comprehensive MRSA colonization surveillance program. It is not intended to diagnose MRSA infection nor to guide or monitor treatment for MRSA infections. Performed at [facility], [address] [place], [place] [address]       Radiology Studies: DG Chest 2 View  Result Date: 03/09/2021 CLINICAL DATA:  Cough EXAM: CHEST - 2 VIEW COMPARISON:  April 16, 2018 FINDINGS: The cardiomediastinal silhouette is unchanged in contour. No pleural effusion. No pneumothorax. Faint reticulonodular opacities at the RIGHT lung base. Mild peribronchial cuffing, increased in comparison to prior. Surgical clips project over the upper abdomen. No acute osseous abnormality. IMPRESSION: 1. Faint reticulonodular opacities at the RIGHT lung base. Differential considerations include  infection, aspiration or atelectasis. Followup PA and lateral chest X-ray is recommended in 3-4 weeks following trial of antibiotic therapy to ensure resolution and exclude underlying malignancy. 2. Mild bronchial wall thickening as can be seen in bronchitis or small airways disease. Electronically Signed   By: Meda Klinefelter MD   On: 03/09/2021 12:55   CT CHEST WO CONTRAST  Result Date: 03/09/2021 CLINICAL DATA:  40 year old female with respiratory failure. EXAM: CT CHEST WITHOUT CONTRAST TECHNIQUE: Multidetector CT imaging of the chest was performed following the standard protocol without IV contrast. COMPARISON:  Chest CT dated 02/06/2014 and radiograph dated 03/09/2021. FINDINGS: Evaluation of this exam is limited in the absence of intravenous contrast. Cardiovascular: There is no cardiomegaly or pericardial effusion. The thoracic aorta and central pulmonary arteries are grossly unremarkable on this noncontrast CT. Mediastinum/Nodes: Mild fullness of the hila suspicious for mild adenopathy. The  esophagus and the thyroid gland are grossly unremarkable. No mediastinal fluid collection. Lungs/Pleura: Scattered bilateral confluent ground-glass opacities most concerning for atypical infection. Clinical correlation and follow-up recommended. There is no pleural effusion pneumothorax. The central airways are patent. Upper Abdomen: Cholecystectomy. Musculoskeletal: No chest wall mass or suspicious bone lesions identified. IMPRESSION: Scattered bilateral confluent ground-glass opacities most concerning for atypical infection. Clinical correlation and follow-up recommended. Electronically Signed   By: Elgie Collard M.D.   On: 03/09/2021 18:01    Scheduled Meds: . enoxaparin (LOVENOX) injection  40 mg Subcutaneous Q24H  . insulin aspart  0-15 Units Subcutaneous TID WC  . insulin aspart  0-5 Units Subcutaneous QHS  . insulin detemir  10 Units Subcutaneous Daily  . ipratropium-albuterol  3 mL Nebulization BID  . methylPREDNISolone (SOLU-MEDROL) injection  60 mg Intravenous Q12H  . nicotine  21 mg Transdermal Daily  . pantoprazole (PROTONIX) IV  40 mg Intravenous Q24H   Continuous Infusions: . sodium chloride 75 mL/hr at 03/09/21 2016  . azithromycin 500 mg (03/09/21 2315)  . ceFEPime (MAXIPIME) IV 2 g (03/10/21 0839)  . vancomycin 1,500 mg (03/10/21 0312)     LOS: 1 day   Time spent: 25 minutes.  Tyrone Nine, MD [facility] [email or web address] 03/10/2021, 12:17 PM

## 2021-03-11 ENCOUNTER — Other Ambulatory Visit (HOSPITAL_COMMUNITY): Payer: Self-pay

## 2021-03-11 DIAGNOSIS — R739 Hyperglycemia, unspecified: Secondary | ICD-10-CM

## 2021-03-11 LAB — GLUCOSE, CAPILLARY
Glucose-Capillary: 236 mg/dL — ABNORMAL HIGH (ref 70–99)
Glucose-Capillary: 367 mg/dL — ABNORMAL HIGH (ref 70–99)

## 2021-03-11 MED ORDER — PANTOPRAZOLE SODIUM 40 MG PO TBEC
40.0000 mg | DELAYED_RELEASE_TABLET | Freq: Every day | ORAL | Status: DC
  Administered 2021-03-11: 40 mg via ORAL
  Filled 2021-03-11: qty 1

## 2021-03-11 MED ORDER — PREDNISONE 10 MG PO TABS
10.0000 mg | ORAL_TABLET | Freq: Every day | ORAL | 0 refills | Status: AC
  Filled 2021-03-11: qty 4, 4d supply, fill #0

## 2021-03-11 MED ORDER — METHYLPREDNISOLONE SODIUM SUCC 40 MG IJ SOLR: 40.0000 mg | Freq: Two times a day (BID) | INTRAMUSCULAR | Status: DC

## 2021-03-11 NOTE — Plan of Care (Signed)
  Problem: Education: Goal: Knowledge of General Education information will improve Description: Including pain rating scale, medication(s)/side effects and non-pharmacologic comfort measures 03/11/2021 1248 by Netta Cedars D, LPN Outcome: Adequate for Discharge 03/11/2021 1048 by Netta Cedars D, LPN Outcome: Progressing   Problem: Health Behavior/Discharge Planning: Goal: Ability to manage health-related needs will improve 03/11/2021 1248 by Netta Cedars D, LPN Outcome: Adequate for Discharge 03/11/2021 1048 by Netta Cedars D, LPN Outcome: Progressing   Problem: Clinical Measurements: Goal: Ability to maintain clinical measurements within normal limits will improve 03/11/2021 1248 by Netta Cedars D, LPN Outcome: Adequate for Discharge 03/11/2021 1048 by Netta Cedars D, LPN Outcome: Progressing Goal: Will remain free from infection 03/11/2021 1248 by Netta Cedars D, LPN Outcome: Adequate for Discharge 03/11/2021 1048 by Netta Cedars D, LPN Outcome: Progressing Goal: Diagnostic test results will improve 03/11/2021 1248 by Netta Cedars D, LPN Outcome: Adequate for Discharge 03/11/2021 1048 by Netta Cedars D, LPN Outcome: Progressing Goal: Respiratory complications will improve 03/11/2021 1248 by Netta Cedars D, LPN Outcome: Adequate for Discharge 03/11/2021 1048 by Netta Cedars D, LPN Outcome: Progressing Goal: Cardiovascular complication will be avoided 03/11/2021 1248 by Netta Cedars D, LPN Outcome: Adequate for Discharge 03/11/2021 1048 by Netta Cedars D, LPN Outcome: Progressing   Problem: Activity: Goal: Risk for activity intolerance will decrease 03/11/2021 1248 by Netta Cedars D, LPN Outcome: Adequate for Discharge 03/11/2021 1048 by Netta Cedars D, LPN Outcome: Progressing   Problem: Nutrition: Goal: Adequate nutrition will be maintained 03/11/2021 1248 by Netta Cedars D, LPN Outcome: Adequate for Discharge 03/11/2021 1048 by Netta Cedars D, LPN Outcome: Progressing   Problem: Coping: Goal: Level of anxiety  will decrease 03/11/2021 1248 by Netta Cedars D, LPN Outcome: Adequate for Discharge 03/11/2021 1048 by Netta Cedars D, LPN Outcome: Progressing   Problem: Elimination: Goal: Will not experience complications related to bowel motility 03/11/2021 1248 by Netta Cedars D, LPN Outcome: Adequate for Discharge 03/11/2021 1048 by Netta Cedars D, LPN Outcome: Progressing Goal: Will not experience complications related to urinary retention 03/11/2021 1248 by Netta Cedars D, LPN Outcome: Adequate for Discharge 03/11/2021 1048 by Netta Cedars D, LPN Outcome: Progressing   Problem: Pain Managment: Goal: General experience of comfort will improve 03/11/2021 1248 by Netta Cedars D, LPN Outcome: Adequate for Discharge 03/11/2021 1048 by Netta Cedars D, LPN Outcome: Progressing   Problem: Safety: Goal: Ability to remain free from injury will improve 03/11/2021 1248 by Netta Cedars D, LPN Outcome: Adequate for Discharge 03/11/2021 1048 by Netta Cedars D, LPN Outcome: Progressing   Problem: Skin Integrity: Goal: Risk for impaired skin integrity will decrease 03/11/2021 1248 by Netta Cedars D, LPN Outcome: Adequate for Discharge 03/11/2021 1048 by Netta Cedars D, LPN Outcome: Progressing

## 2021-03-11 NOTE — Progress Notes (Signed)
AVS provided to pt, all questions answered. Pt. Discharged via private vehicle.

## 2021-03-11 NOTE — Plan of Care (Signed)

## 2021-03-11 NOTE — Progress Notes (Signed)
SATURATION QUALIFICATIONS: (This note is used to comply with regulatory documentation for home oxygen)  Patient Saturations on Room Air at Rest = 95%  Patient Saturations on Room Air while Ambulating = 92%  Patient Saturations on N/A Liters of oxygen while Ambulating = N/A  Please briefly explain why patient needs home oxygen:did not require O2 when walking

## 2021-03-11 NOTE — Discharge Summary (Signed)
Physician Discharge Summary  [name] IEP:[id number] DOB: June 13, 1981 DOA: 03/09/2021  PCP: April Manson, NP  Admit date: 03/09/2021 Discharge date: 03/11/2021  Admitted From: Home Disposition:  Home  Recommendations for Outpatient Follow-up:  1. Follow up with PCP in 1-2 weeks  Discharge Condition:Improved CODE STATUS:Full Diet recommendation: Regular   Brief/Interim Summary: 40 y.o. female with a history of T2DM, anxiety and recurrent pneumonia/bronchitis this year who presented to the ED 5/30 with wheezing, dyspnea and cough associated with hyperglycemia. She was hypoxic with faint reticular opacities on CXR, WBC 13k. Subsequent CT chest revealed scattered GGOs bilaterally. Vancomycin, cefepime, and azithromycin were given with solumedrol and bronchodilators on admission.   Discharge Diagnoses:  Principal Problem:   Respiratory failure with hypoxia (HCC) Active Problems:   TOBACCO ABUSE   Alcohol abuse, episodic drinking behavior   Type 2 diabetes mellitus with hyperglycemia (HCC)  Acute hypoxic respiratory failure due to bilateral CAP: Suspect atypical and/or viral etiology. Negative influenza, covid on admission.  - Respiratory panel was pos for parainfluenza. Empiric antibiotics were stopped - Patient was weaned to room air - Would complete rapid course of prednisone on d/c  T2DM: Dx a few years ago, lost weight and came off insulin but CBGs elevated for weeks during this acute illness. Uncontrolled with hyperglycemia exacerbated by steroids here.  - Deescalate steroids since wheezing improved.  - Cont home meds on d/c - Complete rapid course of prednisone  Tobacco use:  - Cessation counseling - Nicotine patch  Anxiety, depression:  - Reordered home medications  HLD:  - Continue statin, zetia.   Discharge Instructions   Allergies as of 03/11/2021      Reactions   Orange Oil Shortness Of Breath   Penicillins Itching, Rash   Carisoprodol  Other (See Comments)   Skin peeled   Piroxicam Other (See Comments)   Skin peeled      Medication List    STOP taking these medications   doxycycline 100 MG tablet Commonly known as: VIBRA-TABS   metFORMIN 1000 MG tablet Commonly known as: GLUCOPHAGE   methylPREDNISolone 4 MG Tbpk tablet Commonly known as: MEDROL DOSEPAK     TAKE these medications   albuterol 108 (90 Base) MCG/ACT inhaler Commonly known as: VENTOLIN HFA Inhale 1-2 puffs into the lungs every 6 (six) hours as needed for wheezing or shortness of breath.   amphetamine-dextroamphetamine 30 MG tablet Commonly known as: ADDERALL Take 30 mg by mouth 2 (two) times daily with breakfast and lunch.   ARIPiprazole 10 MG tablet Commonly known as: ABILIFY Take 10 mg by mouth every morning.   BD Pen Needle Nano 2nd Gen 32G X 4 MM Misc Generic drug: Insulin Pen Needle daily.   Breztri Aerosphere 160-9-4.8 MCG/ACT Aero Generic drug: Budeson-Glycopyrrol-Formoterol Inhale 1 puff into the lungs 2 (two) times daily.   ezetimibe 10 MG tablet Commonly known as: Zetia Take 1 tablet (10 mg total) by mouth daily.   FISH OIL PO Take 1 capsule by mouth every morning.   gabapentin 300 MG capsule Commonly known as: NEURONTIN Take 1-2 capsules (300-600 mg total) by mouth at bedtime. Only take as prescribed.  Husband is to administer medication What changed:   how much to take  additional instructions   gabapentin 100 MG capsule Commonly known as: NEURONTIN Take 1 capsule (100 mg total) by mouth daily after breakfast. What changed: when to take this   lisinopril 2.5 MG tablet Commonly known as: ZESTRIL Take 1 tablet (2.5 mg  total) by mouth daily.   multivitamin with minerals Tabs tablet Take 1 tablet by mouth every morning.   nicotine 21 mg/24hr patch Commonly known as: NICODERM CQ - dosed in mg/24 hours Place 21 mg onto the skin daily.   Ozempic (1 MG/DOSE) 4 MG/3ML Sopn Generic drug: Semaglutide (1  MG/DOSE) Inject 1 mg into the skin every Wednesday.   pravastatin 80 MG tablet Commonly known as: PRAVACHOL Take 1 tablet (80 mg total) by mouth daily. What changed: when to take this   predniSONE 10 MG tablet Commonly known as: DELTASONE Take 1 tablet (10 mg total) by mouth daily for 4 days.   Promethazine-Codeine 6.25-10 MG/5ML Soln Take 5 mLs by mouth every 4 (four) hours as needed (cough).   sertraline 100 MG tablet Commonly known as: ZOLOFT Take 2 tablets (200 mg total) by mouth daily. For depression and anxiety.   Synjardy 12.02-999 MG Tabs Generic drug: Empagliflozin-metFORMIN HCl Take 1 tablet by mouth 2 (two) times daily.   traZODone 100 MG tablet Commonly known as: DESYREL Take 200 mg by mouth at bedtime.   VITAMIN D3 PO Take 1 tablet by mouth every morning.       Follow-up Information    April Manson, NP Follow up.   Specialty: Family Medicine Contact information: [address] [place] [address] [phone]              Allergies  Allergen Reactions  . Orange Oil Shortness Of Breath  . Penicillins Itching and Rash  . Carisoprodol Other (See Comments)    Skin peeled   . Piroxicam Other (See Comments)    Skin peeled    Procedures/Studies: DG Chest 2 View  Result Date: 03/09/2021 CLINICAL DATA:  Cough EXAM: CHEST - 2 VIEW COMPARISON:  April 16, 2018 FINDINGS: The cardiomediastinal silhouette is unchanged in contour. No pleural effusion. No pneumothorax. Faint reticulonodular opacities at the RIGHT lung base. Mild peribronchial cuffing, increased in comparison to prior. Surgical clips project over the upper abdomen. No acute osseous abnormality. IMPRESSION: 1. Faint reticulonodular opacities at the RIGHT lung base. Differential considerations include infection, aspiration or atelectasis. Followup PA and lateral chest X-ray is recommended in 3-4 weeks following trial of antibiotic therapy to ensure resolution and exclude underlying  malignancy. 2. Mild bronchial wall thickening as can be seen in bronchitis or small airways disease. Electronically Signed   By: Meda Klinefelter MD   On: 03/09/2021 12:55   CT CHEST WO CONTRAST  Result Date: 03/09/2021 CLINICAL DATA:  40 year old female with respiratory failure. EXAM: CT CHEST WITHOUT CONTRAST TECHNIQUE: Multidetector CT imaging of the chest was performed following the standard protocol without IV contrast. COMPARISON:  Chest CT dated 02/06/2014 and radiograph dated 03/09/2021. FINDINGS: Evaluation of this exam is limited in the absence of intravenous contrast. Cardiovascular: There is no cardiomegaly or pericardial effusion. The thoracic aorta and central pulmonary arteries are grossly unremarkable on this noncontrast CT. Mediastinum/Nodes: Mild fullness of the hila suspicious for mild adenopathy. The esophagus and the thyroid gland are grossly unremarkable. No mediastinal fluid collection. Lungs/Pleura: Scattered bilateral confluent ground-glass opacities most concerning for atypical infection. Clinical correlation and follow-up recommended. There is no pleural effusion pneumothorax. The central airways are patent. Upper Abdomen: Cholecystectomy. Musculoskeletal: No chest wall mass or suspicious bone lesions identified. IMPRESSION: Scattered bilateral confluent ground-glass opacities most concerning for atypical infection. Clinical correlation and follow-up recommended. Electronically Signed   By: Elgie Collard M.D.   On: 03/09/2021 18:01  ECHOCARDIOGRAM COMPLETE  Result Date: 03/10/2021    ECHOCARDIOGRAM REPORT   Patient Name:   Laurna A HASWELL Drach Date of Exam: 03/10/2021 Medical Rec #:  [id number]                Height:       66.0 in Accession #:    [id number]               Weight:       183.5 lb Date of Birth:  [birth year]                BSA:          1.928 m Patient Age:    40 years                 BP:           129/89 mmHg Patient Gender: F                        HR:            98 bpm. Exam Location:  Inpatient Procedure: 2D Echo, Cardiac Doppler and Color Doppler Indications:    SOB  History:        Patient has no prior history of Echocardiogram examinations.                 Risk Factors:Diabetes and Current Smoker.  Sonographer:    Shirlean Kelly Referring Phys: [phone] EKTA V PATEL IMPRESSIONS  1. Left ventricular ejection fraction, by estimation, is 60 to 65%. The left ventricle has normal function. The left ventricle has no regional wall motion abnormalities. Left ventricular diastolic parameters were normal.  2. Right ventricular systolic function is normal. The right ventricular size is normal. Tricuspid regurgitation signal is inadequate for assessing PA pressure.  3. The mitral valve is normal in structure. No evidence of mitral valve regurgitation.  4. The aortic valve was not well visualized. Aortic valve regurgitation is not visualized. No aortic stenosis is present.  5. The inferior vena cava is normal in size with greater than 50% respiratory variability, suggesting right atrial pressure of 3 mmHg. FINDINGS  Left Ventricle: Left ventricular ejection fraction, by estimation, is 60 to 65%. The left ventricle has normal function. The left ventricle has no regional wall motion abnormalities. The left ventricular internal cavity size was normal in size. There is  no left ventricular hypertrophy. Left ventricular diastolic parameters were normal. Right Ventricle: The right ventricular size is normal. No increase in right ventricular wall thickness. Right ventricular systolic function is normal. Tricuspid regurgitation signal is inadequate for assessing PA pressure. Left Atrium: Left atrial size was normal in size. Right Atrium: Right atrial size was normal in size. Pericardium: There is no evidence of pericardial effusion. Mitral Valve: The mitral valve is normal in structure. No evidence of mitral valve regurgitation. Tricuspid Valve: The tricuspid valve is normal in  structure. Tricuspid valve regurgitation is not demonstrated. Aortic Valve: The aortic valve was not well visualized. Aortic valve regurgitation is not visualized. No aortic stenosis is present. Aortic valve mean gradient measures 6.0 mmHg. Aortic valve peak gradient measures 11.7 mmHg. Aortic valve area, by VTI measures 2.04 cm. Pulmonic Valve: The pulmonic valve was not well visualized. Pulmonic valve regurgitation is not visualized. Aorta: The aortic root and ascending aorta are structurally normal, with no evidence of dilitation. Venous: The inferior vena cava is normal in size with greater than 50% respiratory  variability, suggesting right atrial pressure of 3 mmHg. IAS/Shunts: The interatrial septum was not well visualized.  LEFT VENTRICLE PLAX 2D LVIDd:         4.70 cm  Diastology LVIDs:         2.90 cm  LV e' medial:    11.40 cm/s LV PW:         0.90 cm  LV E/e' medial:  9.6 LV IVS:        0.80 cm  LV e' lateral:   11.90 cm/s LVOT diam:     2.00 cm  LV E/e' lateral: 9.2 LV SV:         57 LV SV Index:   29 LVOT Area:     3.14 cm  RIGHT VENTRICLE             IVC RV Basal diam:  2.90 cm     IVC diam: 1.10 cm RV S prime:     13.30 cm/s TAPSE (M-mode): 2.3 cm LEFT ATRIUM             Index       RIGHT ATRIUM          Index LA diam:        3.50 cm 1.82 cm/m  RA Area:     9.62 cm LA Vol (A2C):   44.5 ml 23.08 ml/m RA Volume:   19.00 ml 9.86 ml/m LA Vol (A4C):   40.8 ml 21.16 ml/m LA Biplane Vol: 42.4 ml 21.99 ml/m  AORTIC VALVE AV Area (Vmax):    1.98 cm AV Area (Vmean):   1.96 cm AV Area (VTI):     2.04 cm AV Vmax:           171.00 cm/s AV Vmean:          115.000 cm/s AV VTI:            0.279 m AV Peak Grad:      11.7 mmHg AV Mean Grad:      6.0 mmHg LVOT Vmax:         108.00 cm/s LVOT Vmean:        71.700 cm/s LVOT VTI:          0.181 m LVOT/AV VTI ratio: 0.65  AORTA Ao Root diam: 2.90 cm Ao Asc diam:  3.00 cm MITRAL VALVE MV Area (PHT): 4.57 cm     SHUNTS MV Decel Time: 166 msec     Systemic VTI:   0.18 m MV E velocity: 110.00 cm/s  Systemic Diam: 2.00 cm MV A velocity: 115.00 cm/s MV E/A ratio:  0.96 Epifanio Lesches MD Electronically signed by Epifanio Lesches MD Signature Date/Time: 03/10/2021/1:46:16 PM    Final      Subjective: Eager to go home  Discharge Exam: Vitals:   03/11/21 0725 03/11/21 0925  BP:  119/63  Pulse: 90 84  Resp: 18 13  Temp:  98.5 F (36.9 C)  SpO2: 98% 93%   Vitals:   03/11/21 0000 03/11/21 0318 03/11/21 0725 03/11/21 0925  BP: 126/74 116/69  119/63  Pulse: 94 76 90 84  Resp: 20 12 18 13   Temp: 98.4 F (36.9 C) 98 F (36.7 C)  98.5 F (36.9 C)  TempSrc: Oral Oral  Oral  SpO2: 95% 100% 98% 93%  Weight:      Height:        General: Pt is alert, awake, not in acute distress Cardiovascular: RRR, S1/S2 +, no rubs, no gallops Respiratory:  CTA bilaterally, no wheezing, no rhonchi Abdominal: Soft, NT, ND, bowel sounds + Extremities: no edema, no cyanosis   The results of significant diagnostics from this hospitalization (including imaging, microbiology, ancillary and laboratory) are listed below for reference.     Microbiology: Recent Results (from the past 240 hour(s))  Resp Panel by RT-PCR (Flu A&B, Covid) Nasopharyngeal Swab     Status: None   Collection Time: 03/09/21  1:15 PM   Specimen: Nasopharyngeal Swab; Nasopharyngeal(NP) swabs in vial transport medium  Result Value Ref Range Status   SARS Coronavirus 2 by RT PCR NEGATIVE NEGATIVE Final    Comment: (NOTE) SARS-CoV-2 target nucleic acids are NOT DETECTED.  The SARS-CoV-2 RNA is generally detectable in upper respiratory specimens during the acute phase of infection. The lowest concentration of SARS-CoV-2 viral copies this assay can detect is 138 copies/mL. A negative result does not preclude SARS-Cov-2 infection and should not be used as the sole basis for treatment or other patient management decisions. A negative result may occur with  improper specimen  collection/handling, submission of specimen other than nasopharyngeal swab, presence of viral mutation(s) within the areas targeted by this assay, and inadequate number of viral copies(<138 copies/mL). A negative result must be combined with clinical observations, patient history, and epidemiological information. The expected result is Negative.  Fact Sheet for Patients:  BloggerCourse.comhttps://www.fda.gov/media/152166/download  Fact Sheet for Healthcare Providers:  SeriousBroker.ithttps://www.fda.gov/media/152162/download  This test is no t yet approved or cleared by the [place] FDA and  has been authorized for detection and/or diagnosis of SARS-CoV-2 by FDA under an Emergency Use Authorization (EUA). This EUA will remain  in effect (meaning this test can be used) for the duration of the COVID-19 declaration under Section 564(b)(1) of the Act, 21 U.S.C.section 360bbb-3(b)(1), unless the authorization is terminated  or revoked sooner.       Influenza A by PCR NEGATIVE NEGATIVE Final   Influenza B by PCR NEGATIVE NEGATIVE Final    Comment: (NOTE) The Xpert Xpress SARS-CoV-2/FLU/RSV plus assay is intended as an aid in the diagnosis of influenza from Nasopharyngeal swab specimens and should not be used as a sole basis for treatment. Nasal washings and aspirates are unacceptable for Xpert Xpress SARS-CoV-2/FLU/RSV testing.  Fact Sheet for Patients: BloggerCourse.comhttps://www.fda.gov/media/152166/download  Fact Sheet for Healthcare Providers: SeriousBroker.ithttps://www.fda.gov/media/152162/download  This test is not yet approved or cleared by the [place] FDA and has been authorized for detection and/or diagnosis of SARS-CoV-2 by FDA under an Emergency Use Authorization (EUA). This EUA will remain in effect (meaning this test can be used) for the duration of the COVID-19 declaration under Section 564(b)(1) of the Act, 21 U.S.C. section 360bbb-3(b)(1), unless the authorization is terminated or revoked.  Performed at [facility], [address] [place], [place] [id number]   Respiratory (~20 pathogens) panel by PCR     Status: Abnormal   Collection Time: 03/10/21  9:54 AM   Specimen: Nasopharyngeal Swab; Respiratory  Result Value Ref Range Status   Adenovirus NOT DETECTED NOT DETECTED Final   Coronavirus 229E NOT DETECTED NOT DETECTED Final    Comment: (NOTE) The Coronavirus on the Respiratory Panel, DOES NOT test for the novel  Coronavirus (2019 nCoV)    Coronavirus HKU1 NOT DETECTED NOT DETECTED Final   Coronavirus NL63 NOT DETECTED NOT DETECTED Final   Coronavirus OC43 NOT DETECTED NOT DETECTED Final   Metapneumovirus NOT DETECTED NOT DETECTED Final   Rhinovirus / Enterovirus NOT DETECTED NOT DETECTED Final   Influenza A NOT  DETECTED NOT DETECTED Final   Influenza B NOT DETECTED NOT DETECTED Final   Parainfluenza Virus 1 NOT DETECTED NOT DETECTED Final   Parainfluenza Virus 2 NOT DETECTED NOT DETECTED Final   Parainfluenza Virus 3 NOT DETECTED NOT DETECTED Final   Parainfluenza Virus 4 DETECTED (A) NOT DETECTED Final   Respiratory Syncytial Virus NOT DETECTED NOT DETECTED Final   Bordetella pertussis NOT DETECTED NOT DETECTED Final   Bordetella Parapertussis NOT DETECTED NOT DETECTED Final   Chlamydophila pneumoniae NOT DETECTED NOT DETECTED Final   Mycoplasma pneumoniae NOT DETECTED NOT DETECTED Final    Comment: Performed at [facility], [address], [place], [place] [address]  MRSA PCR Screening     Status: None   Collection Time: 03/10/21  9:54 AM   Specimen: Nasopharyngeal  Result Value Ref Range Status   MRSA by PCR NEGATIVE NEGATIVE Final    Comment:        The GeneXpert MRSA Assay (FDA approved for NASAL specimens only), is one component of a comprehensive MRSA colonization surveillance program. It is not intended to diagnose MRSA infection nor to guide or monitor treatment for MRSA infections. Performed at [facility], [address], [place], [place]  [address]      Labs: BNP (last 3 results) Recent Labs    03/09/21 1532  BNP 29.0   Basic Metabolic Panel: Recent Labs  Lab 03/09/21 1220 03/10/21 0144  NA 130* 132*  K 3.9 4.7  CL 94* 97*  CO2 24 26  GLUCOSE 369* 437*  BUN 7 10  CREATININE 0.54 0.65  CALCIUM 8.6* 8.6*   Liver Function Tests: Recent Labs  Lab 03/09/21 1532 03/10/21 0144  AST 14* 14*  ALT 17 15  ALKPHOS 103 102  BILITOT 0.7 0.4  PROT 6.2* 6.0*  ALBUMIN 2.8* 2.6*   No results for input(s): LIPASE, AMYLASE in the last 168 hours. No results for input(s): AMMONIA in the last 168 hours. CBC: Recent Labs  Lab 03/09/21 1220 03/10/21 0144  WBC 13.0* 12.9*  NEUTROABS 9.6*  --   HGB 15.7* 15.0  HCT 45.2 44.9  MCV 88.8 90.2  PLT 326 363   Cardiac Enzymes: No results for input(s): CKTOTAL, CKMB, CKMBINDEX, TROPONINI in the last 168 hours. BNP: Invalid input(s): POCBNP CBG: Recent Labs  Lab 03/10/21 0746 03/10/21 1154 03/10/21 1644 03/10/21 2144 03/11/21 0808  GLUCAP 329* 338* 210* 407* 236*   D-Dimer Recent Labs    03/09/21 1532  DDIMER 0.34   Hgb A1c Recent Labs    03/09/21 1532  HGBA1C 13.9*   Lipid Profile No results for input(s): CHOL, HDL, LDLCALC, TRIG, CHOLHDL, LDLDIRECT in the last 72 hours. Thyroid function studies No results for input(s): TSH, T4TOTAL, T3FREE, THYROIDAB in the last 72 hours.  Invalid input(s): FREET3 Anemia work up No results for input(s): VITAMINB12, FOLATE, FERRITIN, TIBC, IRON, RETICCTPCT in the last 72 hours. Urinalysis    Component Value Date/Time   COLORURINE STRAW (A) 02/18/2021 0137   APPEARANCEUR CLEAR 02/18/2021 0137   LABSPEC 1.030 02/18/2021 0137   PHURINE 6.0 02/18/2021 0137   GLUCOSEU >=500 (A) 02/18/2021 0137   HGBUR NEGATIVE 02/18/2021 0137   HGBUR negative 12/26/2008 1435   BILIRUBINUR NEGATIVE 02/18/2021 0137   KETONESUR NEGATIVE 02/18/2021 0137   PROTEINUR NEGATIVE 02/18/2021 0137   UROBILINOGEN 0.2 01/09/2018 1114   NITRITE  NEGATIVE 02/18/2021 0137   LEUKOCYTESUR NEGATIVE 02/18/2021 0137   Sepsis Labs Invalid input(s): PROCALCITONIN,  WBC,  LACTICIDVEN  Microbiology Recent Results (from the past 240 hour(s))  Resp Panel by RT-PCR (Flu A&B, Covid) Nasopharyngeal Swab     Status: None   Collection Time: 03/09/21  1:15 PM   Specimen: Nasopharyngeal Swab; Nasopharyngeal(NP) swabs in vial transport medium  Result Value Ref Range Status   SARS Coronavirus 2 by RT PCR NEGATIVE NEGATIVE Final    Comment: (NOTE) SARS-CoV-2 target nucleic acids are NOT DETECTED.  The SARS-CoV-2 RNA is generally detectable in upper respiratory specimens during the acute phase of infection. The lowest concentration of SARS-CoV-2 viral copies this assay can detect is 138 copies/mL. A negative result does not preclude SARS-Cov-2 infection and should not be used as the sole basis for treatment or other patient management decisions. A negative result may occur with  improper specimen collection/handling, submission of specimen other than nasopharyngeal swab, presence of viral mutation(s) within the areas targeted by this assay, and inadequate number of viral copies(<138 copies/mL). A negative result must be combined with clinical observations, patient history, and epidemiological information. The expected result is Negative.  Fact Sheet for Patients:  BloggerCourse.com  Fact Sheet for Healthcare Providers:  SeriousBroker.it  This test is no t yet approved or cleared by the [place] FDA and  has been authorized for detection and/or diagnosis of SARS-CoV-2 by FDA under an Emergency Use Authorization (EUA). This EUA will remain  in effect (meaning this test can be used) for the duration of the COVID-19 declaration under Section 564(b)(1) of the Act, 21 U.S.C.section 360bbb-3(b)(1), unless the authorization is terminated  or revoked sooner.       Influenza A by PCR NEGATIVE  NEGATIVE Final   Influenza B by PCR NEGATIVE NEGATIVE Final    Comment: (NOTE) The Xpert Xpress SARS-CoV-2/FLU/RSV plus assay is intended as an aid in the diagnosis of influenza from Nasopharyngeal swab specimens and should not be used as a sole basis for treatment. Nasal washings and aspirates are unacceptable for Xpert Xpress SARS-CoV-2/FLU/RSV testing.  Fact Sheet for Patients: BloggerCourse.com  Fact Sheet for Healthcare Providers: SeriousBroker.it  This test is not yet approved or cleared by the [place] FDA and has been authorized for detection and/or diagnosis of SARS-CoV-2 by FDA under an Emergency Use Authorization (EUA). This EUA will remain in effect (meaning this test can be used) for the duration of the COVID-19 declaration under Section 564(b)(1) of the Act, 21 U.S.C. section 360bbb-3(b)(1), unless the authorization is terminated or revoked.  Performed at [facility], [address] [place], [place] [address]   Respiratory (~20 pathogens) panel by PCR     Status: Abnormal   Collection Time: 03/10/21  9:54 AM   Specimen: Nasopharyngeal Swab; Respiratory  Result Value Ref Range Status   Adenovirus NOT DETECTED NOT DETECTED Final   Coronavirus 229E NOT DETECTED NOT DETECTED Final    Comment: (NOTE) The Coronavirus on the Respiratory Panel, DOES NOT test for the novel  Coronavirus (2019 nCoV)    Coronavirus HKU1 NOT DETECTED NOT DETECTED Final   Coronavirus NL63 NOT DETECTED NOT DETECTED Final   Coronavirus OC43 NOT DETECTED NOT DETECTED Final   Metapneumovirus NOT DETECTED NOT DETECTED Final   Rhinovirus / Enterovirus NOT DETECTED NOT DETECTED Final   Influenza A NOT DETECTED NOT DETECTED Final   Influenza B NOT DETECTED NOT DETECTED Final   Parainfluenza Virus 1 NOT DETECTED NOT DETECTED Final   Parainfluenza Virus 2 NOT DETECTED NOT DETECTED Final   Parainfluenza Virus 3 NOT DETECTED NOT DETECTED  Final  Parainfluenza Virus 4 DETECTED (A) NOT DETECTED Final   Respiratory Syncytial Virus NOT DETECTED NOT DETECTED Final   Bordetella pertussis NOT DETECTED NOT DETECTED Final   Bordetella Parapertussis NOT DETECTED NOT DETECTED Final   Chlamydophila pneumoniae NOT DETECTED NOT DETECTED Final   Mycoplasma pneumoniae NOT DETECTED NOT DETECTED Final    Comment: Performed at [facility], [address], [place], [place] [address]  MRSA PCR Screening     Status: None   Collection Time: 03/10/21  9:54 AM   Specimen: Nasopharyngeal  Result Value Ref Range Status   MRSA by PCR NEGATIVE NEGATIVE Final    Comment:        The GeneXpert MRSA Assay (FDA approved for NASAL specimens only), is one component of a comprehensive MRSA colonization surveillance program. It is not intended to diagnose MRSA infection nor to guide or monitor treatment for MRSA infections. Performed at [facility], [address] [address] [place], [place] [address]    Time spent:  SIGNED:   Rickey Barbara, MD  [facility] 03/11/2021, 12:19 PM  If 7PM-7AM, please contact night-coverage

## 2021-05-29 ENCOUNTER — Telehealth: Payer: Self-pay | Admitting: Specialist

## 2021-05-29 NOTE — Telephone Encounter (Signed)
Pt called to set an appt for 9/22. Pt is asking for a new handicap placard that expires in Sept. She is asking for a new one before it runs out. Please call pt about this matter at [phone].

## 2021-06-10 NOTE — Telephone Encounter (Signed)
Per JN, will discuss at her appt.

## 2021-06-10 NOTE — Telephone Encounter (Signed)
Gave form to Dr. Otelia Sergeant to sign off on

## 2021-06-10 NOTE — Telephone Encounter (Signed)
I tried to call but her VM has not been setup, Per Dr. Otelia Sergeant we will discuss the Handicap Placard at her appt on 07/02/21

## 2021-07-02 ENCOUNTER — Encounter: Payer: Self-pay | Admitting: Specialist

## 2021-07-02 ENCOUNTER — Ambulatory Visit: Payer: Managed Care, Other (non HMO) | Admitting: Specialist

## 2021-07-02 ENCOUNTER — Other Ambulatory Visit: Payer: Self-pay

## 2021-07-02 VITALS — BP 135/78 | HR 94 | Ht 67.0 in | Wt 177.0 lb

## 2021-07-02 DIAGNOSIS — M5116 Intervertebral disc disorders with radiculopathy, lumbar region: Secondary | ICD-10-CM

## 2021-07-02 DIAGNOSIS — G894 Chronic pain syndrome: Secondary | ICD-10-CM

## 2021-07-02 DIAGNOSIS — M5136 Other intervertebral disc degeneration, lumbar region: Secondary | ICD-10-CM

## 2021-07-02 MED ORDER — ETODOLAC 400 MG PO TABS: 400.0000 mg | ORAL_TABLET | Freq: Two times a day (BID) | ORAL | 3 refills | Status: DC

## 2021-07-02 NOTE — Progress Notes (Signed)
Office Visit Note   Patient: [name]           Date of Birth: Mar 20, 1981           MRN: [id number] Visit Date: 07/02/2021              Requested by: April Manson, NP [address]  [place] [address] PCP: April Manson, NP   Assessment & Plan: Visit Diagnoses:  1. Degenerative disc disease, lumbar   2. Lumbar disc herniation with radiculopathy   3. Chronic pain syndrome     Plan: Avoid frequent bending and stooping  No lifting greater than 10 lbs. May use ice or moist heat for pain. Weight loss is of benefit. Best medication for lumbar disc disease is arthritis medications like etodolac for pain. Exercise is important to improve your indurance and does allow people to function better inspite of back pain. Referral for consideration of a [place] stimulator with our pyshsiatrist, Dr. Alvester Morin. Recommend decreasing gabapentin to 400 mg at night and 100-200 mg in the AM.  Follow-Up Instructions: Return in about 4 weeks (around 07/30/2021).   Orders:  Orders Placed This Encounter  Procedures   Ambulatory referral to Physical Medicine Rehab   Meds ordered this encounter  Medications   etodolac (LODINE) 400 MG tablet    Sig: Take 1 tablet (400 mg total) by mouth 2 (two) times daily.    Dispense:  60 tablet    Refill:  3      Procedures: No procedures performed   Clinical Data: No additional findings.   Subjective: Chief Complaint  Patient presents with   Lower Back - Follow-up    40 year old female with history of back pain and intermittant pain into the hips and knees but not usually below the knees. Pain is 4-5 but high as a 9-10 and as low as  a  2. She is able to sleep well but has increased pain with bending, stooping, riding in cars, lifting and with vacuuming and sweeping and raking. She has been to PT in the past and this just seems to make the pain worse. She has undergone ESI and also injected trigger points without relief.  She feels as though she is becoming allergic to sulfa, is able to take aspirin. Allergy to piroxicam. No bowel or bladder difficulty. We have considered surgical solution For a 3 level lumbar fusion for degenerative disc disease. She is able to walk and can grocery shop and be the end of the walking she is sore.    Review of Systems  Constitutional: Negative.   HENT: Negative.    Eyes: Negative.   Respiratory: Negative.    Cardiovascular: Negative.   Gastrointestinal: Negative.   Endocrine: Negative.   Genitourinary: Negative.   Musculoskeletal: Negative.   Skin: Negative.   Allergic/Immunologic: Negative.   Neurological: Negative.   Hematological: Negative.   Psychiatric/Behavioral: Negative.      Objective: Vital Signs: Ht 5\' 7"  (1.702 m)   Wt 177 lb (80.3 kg)   BMI 27.72 kg/m   Physical Exam Constitutional:      Appearance: She is well-developed.  HENT:     Head: Normocephalic and atraumatic.  Eyes:     Pupils: Pupils are equal, round, and reactive to light.  Pulmonary:     Effort: Pulmonary effort is normal.     Breath sounds: Normal breath sounds.  Abdominal:     General: Bowel sounds are  normal.     Palpations: Abdomen is soft.  Musculoskeletal:     Cervical back: Normal range of motion and neck supple.     Lumbar back: Negative right straight leg raise test and negative left straight leg raise test.  Skin:    General: Skin is warm and dry.  Neurological:     Mental Status: She is alert and oriented to person, place, and time.  Psychiatric:        Behavior: Behavior normal.        Thought Content: Thought content normal.        Judgment: Judgment normal.   Back Exam   Tenderness  The patient is experiencing tenderness in the lumbar.  Range of Motion  Flexion:  abnormal  Lateral bend right:  normal  Lateral bend left:  normal  Rotation right:  normal  Rotation left:  normal   Muscle Strength  Right Quadriceps:  5/5  Left Quadriceps:  5/5  Right  Hamstrings:  5/5  Left Hamstrings:  5/5   Tests  Straight leg raise right: negative Straight leg raise left: negative  Reflexes  Patellar:  2/4 Achilles:  2/4  Other  Toe walk: normal Heel walk: normal    Specialty Comments:  No specialty comments available.  Imaging: No results found.   PMFS History: Patient Active Problem List   Diagnosis Date Noted   Respiratory failure with hypoxia (HCC) 03/09/2021   Diabetes mellitus without complication (HCC)    Type 2 diabetes mellitus with hyperglycemia (HCC) 09/22/2020   Protrusion of lumbar intervertebral disc 03/31/2020   Obesity (BMI 30.0-34.9) 04/11/2019   Neuropathy 04/11/2019   Hyperlipidemia 04/11/2019   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   Uncontrolled type 2 diabetes mellitus, without long-term current use of insulin (HCC) 04/11/2019   Overdose of antipsychotic 01/23/2017   Hypoxia 01/17/2017   Diabetes 1.5, 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   Bipolar I disorder, most recent episode depressed (HCC) 06/11/2013   Depression with suicidal ideation 06/09/2013   Alcohol abuse, episodic drinking behavior 06/09/2013   Major depressive disorder, recurrent episode, severe, specified as with psychotic behavior 06/09/2013   Benzodiazepine-based tranquilizers causing adverse effect in therapeutic use 10/19/2011   SNORING 07/18/2009   Ovarian cyst 06/13/2009   HYPERCHOLESTEROLEMIA 09/19/2008   Back pain 09/18/2008   TOBACCO ABUSE 08/13/2008   Insomnia 08/13/2008   Past Medical History:  Diagnosis Date   Anxiety    Back pain    CAP (community acquired pneumonia)    Chronic pain syndrome    Depression    Diabetes mellitus without complication (HCC)    Gallbladder problem    Obsessive compulsive disorder     Family History  Problem Relation Age of Onset   Diabetes insipidus Father    Arthritis Father    Heart failure Father    COPD Father    Emphysema Father    Asthma Son      Past Surgical History:  Procedure Laterality Date   CESAREAN SECTION     CHOLECYSTECTOMY  2009   wisdom teeth extraction     Social History   Occupational History   Not on file  Tobacco Use   Smoking status: Every Day    Packs/day: 0.50    Years: 23.00    Pack years: 11.50    Types: Cigarettes   Smokeless tobacco: Never  Substance and Sexual Activity   Alcohol use: Yes    Comment: unsure amount (1/6) - states generally  does not drink   Drug use: No   Sexual activity: Yes    Birth control/protection: None

## 2021-07-02 NOTE — Patient Instructions (Addendum)
Avoid frequent bending and stooping  No lifting greater than 10 lbs. May use ice or moist heat for pain. Weight loss is of benefit. Best medication for lumbar disc disease is arthritis medications like etodolac for pain. Exercise is important to improve your indurance and does allow people to function better inspite of back pain. Referral for consideration of a [place] stimulator with our pyshsiatrist, Dr. Alvester Morin. Recommend decreasing gabapentin to 400 mg at night and 100-200 mg in the AM.

## 2021-07-13 ENCOUNTER — Other Ambulatory Visit: Payer: Self-pay

## 2021-07-13 ENCOUNTER — Emergency Department (HOSPITAL_BASED_OUTPATIENT_CLINIC_OR_DEPARTMENT_OTHER): Payer: Managed Care, Other (non HMO) | Admitting: Radiology

## 2021-07-13 ENCOUNTER — Encounter (HOSPITAL_BASED_OUTPATIENT_CLINIC_OR_DEPARTMENT_OTHER): Payer: Self-pay

## 2021-07-13 ENCOUNTER — Emergency Department (HOSPITAL_BASED_OUTPATIENT_CLINIC_OR_DEPARTMENT_OTHER)
Admission: EM | Admit: 2021-07-13 | Discharge: 2021-07-13 | Disposition: A | Discharge status: Home / Self Care | Payer: Managed Care, Other (non HMO) | Attending: Emergency Medicine | Admitting: Emergency Medicine

## 2021-07-13 DIAGNOSIS — J189 Pneumonia, unspecified organism: Secondary | ICD-10-CM | POA: Diagnosis not present

## 2021-07-13 DIAGNOSIS — Z79899 Other long term (current) drug therapy: Secondary | ICD-10-CM | POA: Diagnosis not present

## 2021-07-13 DIAGNOSIS — F17210 Nicotine dependence, cigarettes, uncomplicated: Secondary | ICD-10-CM | POA: Diagnosis not present

## 2021-07-13 DIAGNOSIS — E119 Type 2 diabetes mellitus without complications: Secondary | ICD-10-CM | POA: Diagnosis not present

## 2021-07-13 DIAGNOSIS — M549 Dorsalgia, unspecified: Secondary | ICD-10-CM | POA: Insufficient documentation

## 2021-07-13 DIAGNOSIS — Z20822 Contact with and (suspected) exposure to covid-19: Secondary | ICD-10-CM | POA: Insufficient documentation

## 2021-07-13 DIAGNOSIS — Z7984 Long term (current) use of oral hypoglycemic drugs: Secondary | ICD-10-CM | POA: Insufficient documentation

## 2021-07-13 DIAGNOSIS — R509 Fever, unspecified: Secondary | ICD-10-CM | POA: Diagnosis present

## 2021-07-13 DIAGNOSIS — G8929 Other chronic pain: Secondary | ICD-10-CM | POA: Diagnosis not present

## 2021-07-13 LAB — RESP PANEL BY RT-PCR (FLU A&B, COVID) ARPGX2
Influenza A by PCR: NEGATIVE
Influenza B by PCR: NEGATIVE
SARS Coronavirus 2 by RT PCR: NEGATIVE

## 2021-07-13 MED ORDER — KETOROLAC TROMETHAMINE 30 MG/ML IJ SOLN
30.0000 mg | Freq: Once | INTRAMUSCULAR | Status: AC
  Administered 2021-07-13: 30 mg via INTRAMUSCULAR
  Filled 2021-07-13: qty 1

## 2021-07-13 MED ORDER — PREDNISONE 10 MG PO TABS: 50.0000 mg | ORAL_TABLET | Freq: Every day | ORAL | 0 refills | Status: AC

## 2021-07-13 MED ORDER — DOXYCYCLINE HYCLATE 100 MG PO CAPS: 100.0000 mg | ORAL_CAPSULE | Freq: Two times a day (BID) | ORAL | 0 refills | Status: AC

## 2021-07-13 MED ORDER — METHOCARBAMOL 500 MG PO TABS: 500.0000 mg | ORAL_TABLET | Freq: Two times a day (BID) | ORAL | 0 refills | Status: DC

## 2021-07-13 MED ORDER — AEROCHAMBER PLUS FLO-VU MISC
1.0000 | Freq: Once | Status: AC
  Administered 2021-07-13: 1
  Filled 2021-07-13: qty 1

## 2021-07-13 MED ORDER — ALBUTEROL SULFATE HFA 108 (90 BASE) MCG/ACT IN AERS
2.0000 | INHALATION_SPRAY | Freq: Once | RESPIRATORY_TRACT | Status: AC
  Administered 2021-07-13: 2 via RESPIRATORY_TRACT
  Filled 2021-07-13: qty 6.7

## 2021-07-13 NOTE — ED Triage Notes (Signed)
Patient here POV from Home with Fever, Cough, Body Aches, Congestion.   Patient states she has been wheezing as well for 3-4 days PTA.   Subjective Fevers at Home. NAD Noted during Triage. A&Ox4. GCS 15.

## 2021-07-13 NOTE — ED Provider Notes (Signed)
[facility] EMERGENCY DEPT Provider Note   CSN: [id number] Arrival date & time: 07/13/21  1251     History Chief Complaint  Patient presents with   Cough   Fever    [name] is a 40 y.o. female.  40 year old female with history of chronic back pain, pneumonia, diabetes, presents for evaluation of cough with fever, congestion, body aches x3 days.  Patient was exposed to her son who was sick recently.  Patient states that she gets pneumonia every year and this feels the same.  Patient is 1/2 pack a day smoker, does report wheezing.  Also states she has degenerative disc disease and is having back pain flare as well.  No other complaints or concerns today.      Past Medical History:  Diagnosis Date   Anxiety    Back pain    CAP (community acquired pneumonia)    Chronic pain syndrome    Depression    Diabetes mellitus without complication (HCC)    Gallbladder problem    Obsessive compulsive disorder     Patient Active Problem List   Diagnosis Date Noted   Respiratory failure with hypoxia (HCC) 03/09/2021   Diabetes mellitus without complication (HCC)    Type 2 diabetes mellitus with hyperglycemia (HCC) 09/22/2020   Protrusion of lumbar intervertebral disc 03/31/2020   Obesity (BMI 30.0-34.9) 04/11/2019   Neuropathy 04/11/2019   Hyperlipidemia 04/11/2019   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   Uncontrolled type 2 diabetes mellitus, without long-term current use of insulin 04/11/2019   Overdose of antipsychotic 01/23/2017   Hypoxia 01/17/2017   Diabetes 1.5, 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   Bipolar I disorder, most recent episode depressed (HCC) 06/11/2013   Depression with suicidal ideation 06/09/2013   Alcohol abuse, episodic drinking behavior 06/09/2013   Major depressive disorder, recurrent episode, severe, specified as with psychotic behavior 06/09/2013   Benzodiazepine-based tranquilizers causing  adverse effect in therapeutic use 10/19/2011   SNORING 07/18/2009   Ovarian cyst 06/13/2009   HYPERCHOLESTEROLEMIA 09/19/2008   Back pain 09/18/2008   TOBACCO ABUSE 08/13/2008   Insomnia 08/13/2008    Past Surgical History:  Procedure Laterality Date   CESAREAN SECTION     CHOLECYSTECTOMY  2009   wisdom teeth extraction       OB History     Gravida  1   Para  1   Term  1   Preterm      AB      Living  1      SAB      IAB      Ectopic      Multiple      Live Births  1           Family History  Problem Relation Age of Onset   Diabetes insipidus Father    Arthritis Father    Heart failure Father    COPD Father    Emphysema Father    Asthma Son     Social History   Tobacco Use   Smoking status: Every Day    Packs/day: 0.50    Years: 23.00    Pack years: 11.50    Types: Cigarettes   Smokeless tobacco: Never  Substance Use Topics   Alcohol use: Yes    Comment: unsure amount (1/6) - states generally does not drink   Drug use: No    Home Medications Prior to Admission medications   Medication Sig Start Date  End Date Taking? Authorizing Provider  doxycycline (VIBRAMYCIN) 100 MG capsule Take 1 capsule (100 mg total) by mouth 2 (two) times daily for 5 days. 07/13/21 07/18/21 Yes Jeannie Fend, PA-C  methocarbamol (ROBAXIN) 500 MG tablet Take 1 tablet (500 mg total) by mouth 2 (two) times daily. 07/13/21  Yes Jeannie Fend, PA-C  predniSONE (DELTASONE) 10 MG tablet Take 5 tablets (50 mg total) by mouth daily for 5 days. 07/13/21 07/18/21 Yes Jeannie Fend, PA-C  albuterol (PROVENTIL HFA;VENTOLIN HFA) 108 (90 Base) MCG/ACT inhaler Inhale 1-2 puffs into the lungs every 6 (six) hours as needed for wheezing or shortness of breath. 04/17/18   Ivery Quale, PA-C  amphetamine-dextroamphetamine (ADDERALL) 30 MG tablet Take 30 mg by mouth 2 (two) times daily with breakfast and lunch. 09/28/20   [provider]  ARIPiprazole (ABILIFY) 10 MG tablet  Take 10 mg by mouth every morning. 01/21/21   [provider]  BD PEN NEEDLE NANO 2ND GEN 32G X 4 MM MISC daily. 09/23/20   [provider]  Budeson-Glycopyrrol-Formoterol (BREZTRI AEROSPHERE) 160-9-4.8 MCG/ACT AERO Inhale 1 puff into the lungs 2 (two) times daily.    [provider]  Cholecalciferol (VITAMIN D3 PO) Take 1 tablet by mouth every morning.    [provider]  Empagliflozin-metFORMIN HCl (SYNJARDY) 12.02-999 MG TABS Take 1 tablet by mouth 2 (two) times daily. 02/12/21   [provider]  etodolac (LODINE) 400 MG tablet Take 1 tablet (400 mg total) by mouth 2 (two) times daily. 07/02/21   Kerrin Champagne, MD  ezetimibe (ZETIA) 10 MG tablet Take 1 tablet (10 mg total) by mouth daily. 09/26/17   Bing Neighbors, FNP  gabapentin (NEURONTIN) 100 MG capsule Take 1 capsule (100 mg total) by mouth daily after breakfast. Patient taking differently: Take 100 mg by mouth every morning. 02/14/20   Kerrin Champagne, MD  gabapentin (NEURONTIN) 300 MG capsule Take 1-2 capsules (300-600 mg total) by mouth at bedtime. Only take as prescribed.  Husband is to administer medication Patient taking differently: Take 600 mg by mouth at bedtime. Take only as prescribed. Husband is to administer medication 02/14/20   Kerrin Champagne, MD  lisinopril (PRINIVIL,ZESTRIL) 2.5 MG tablet Take 1 tablet (2.5 mg total) by mouth daily. 05/10/17   Bing Neighbors, FNP  Multiple Vitamin (MULTIVITAMIN WITH MINERALS) TABS tablet Take 1 tablet by mouth every morning.    [provider]  nicotine (NICODERM CQ - DOSED IN MG/24 HOURS) 21 mg/24hr patch Place 21 mg onto the skin daily. 03/03/21   [provider]  Omega-3 Fatty Acids (FISH OIL PO) Take 1 capsule by mouth every morning.    [provider]  pravastatin (PRAVACHOL) 80 MG tablet Take 1 tablet (80 mg total) by mouth daily. Patient taking differently: Take 80 mg by mouth every morning. 05/11/17   Bing Neighbors, FNP  Promethazine-Codeine 6.25-10 MG/5ML SOLN Take 5 mLs by mouth every 4 (four) hours as needed (cough). 03/03/21   [provider]  Semaglutide, 1 MG/DOSE, (OZEMPIC, 1 MG/DOSE,) 4 MG/3ML SOPN Inject 1 mg into the skin every Wednesday. 02/12/21   [provider]  sertraline (ZOLOFT) 100 MG tablet Take 2 tablets (200 mg total) by mouth daily. For depression and anxiety. 06/13/13   Tamala Julian, PA-C  traZODone (DESYREL) 100 MG tablet Take 200 mg by mouth at bedtime. 01/21/21   [provider]    Allergies    Orange oil, Penicillins,  Carisoprodol, and Piroxicam  Review of Systems   Review of Systems  Constitutional:  Negative for chills and fever.  HENT:  Positive for congestion, postnasal drip, sneezing and sore throat. Negative for ear pain, rhinorrhea, sinus pressure, sinus pain, trouble swallowing and voice change.   Eyes:  Negative for discharge and redness.  Respiratory:  Positive for cough and wheezing. Negative for shortness of breath.   Musculoskeletal:  Positive for arthralgias, back pain and myalgias.  Skin:  Negative for rash and wound.  Allergic/Immunologic: Positive for immunocompromised state.  Neurological:  Negative for weakness and headaches.  Hematological:  Negative for adenopathy.  All other systems reviewed and are negative.  Physical Exam Updated Vital Signs BP 113/75 (BP Location: Right Arm)   Pulse 95   Temp 98.5 F (36.9 C) (Oral)   Resp 20   Ht 5\' 7"  (1.702 m)   Wt 83.9 kg   SpO2 96%   BMI 28.98 kg/m   Physical Exam Vitals and nursing note reviewed.  Constitutional:      General: She is not in acute distress.    Appearance: She is well-developed. She is not diaphoretic.  HENT:     Head: Normocephalic and atraumatic.     Right Ear: Tympanic membrane and ear canal normal.     Left Ear: Tympanic membrane and ear canal normal.     Nose: Congestion present.     Mouth/Throat:     Mouth: Mucous membranes are moist.      Pharynx: No oropharyngeal exudate or posterior oropharyngeal erythema.  Eyes:     Conjunctiva/sclera: Conjunctivae normal.  Cardiovascular:     Rate and Rhythm: Normal rate and regular rhythm.     Heart sounds: Normal heart sounds.  Pulmonary:     Effort: Pulmonary effort is normal.     Breath sounds: Examination of the left-lower field reveals rhonchi. Wheezing and rhonchi present.  Musculoskeletal:     Cervical back: Neck supple.  Skin:    General: Skin is warm and dry.     Findings: No erythema or rash.  Neurological:     Mental Status: She is alert and oriented to person, place, and time.  Psychiatric:        Behavior: Behavior normal.    ED Results / Procedures / Treatments   Labs (all labs ordered are listed, but only abnormal results are displayed) Labs Reviewed  RESP PANEL BY RT-PCR (FLU A&B, COVID) [id number]    EKG None  Radiology DG Chest 2 View  Result Date: 07/13/2021 CLINICAL DATA:  Pneumonia symptoms with fever x3 days. EXAM: CHEST - 2 VIEW COMPARISON:  Chest radiograph 03/09/2021; CT chest 03/09/2021. FINDINGS: Subtle interstitial and patchy airspace opacities at the left lung base. Right lung is clear. No pleural effusion. No pneumothorax. Heart is normal in size. No acute osseous abnormality. IMPRESSION: Subtle interstitial and patchy airspace opacities in the left lower lobe could represent developing pneumonia in the appropriate clinical context. Electronically Signed   By: 03/11/2021 M.D.   On: 07/13/2021 14:08    Procedures Procedures   Medications Ordered in ED Medications  ketorolac (TORADOL) 30 MG/ML injection 30 mg (has no administration in time range)  aerochamber plus with mask device 1 each (1 each Other Given 07/13/21 1443)  albuterol (VENTOLIN HFA) 108 (90 Base) MCG/ACT inhaler 2 puff (2 puffs Inhalation Given 07/13/21 1443)    ED Course  I have reviewed the triage vital signs and the nursing notes.  Pertinent labs &  imaging results that were  available during my care of the patient were reviewed by me and considered in my medical decision making (see chart for details).  Clinical Course as of 07/13/21 1445  Mon Jul 13, 2021  [id number] 40 year old female presents for evaluation of cough x3 days as above.  Found to have diffuse wheezing with mild rhonchi in the left base. Vitals reassuring including O2 sat 96% on room air.  Denies history of asthma, COPD.  Patient is a daily half pack a day smoker, suspect she may have underlying COPD on her exam today.  Plan is to treat with steroids although with history of diabetes and recent A1c of 10.6, recommend monitoring her blood sugar closely, discontinuing use if blood sugar runs high and follow-up with her doctor. Will treat her pneumonia with doxycycline. Given Robaxin for her back pain with IM Toradol while in the ER. Patient is COVID and flu negative. Chest x-ray with left lower lobe pneumonia, recommend repeat chest x-ray in 6 weeks with her PCP. [LM]    Clinical Course User Index [LM] Alden Hipp   MDM Rules/Calculators/A&P                           Final Clinical Impression(s) / ED Diagnoses Final diagnoses:  Community acquired pneumonia of left lower lobe of lung  Chronic back pain, unspecified back location, unspecified back pain laterality    Rx / DC Orders ED Discharge Orders          Ordered    predniSONE (DELTASONE) 10 MG tablet  Daily        07/13/21 1435    methocarbamol (ROBAXIN) 500 MG tablet  2 times daily        07/13/21 1435    doxycycline (VIBRAMYCIN) 100 MG capsule  2 times daily        07/13/21 1435             Alden Hipp 07/13/21 1445    Derwood Kaplan, MD 07/14/21 [phone]

## 2021-07-13 NOTE — Discharge Instructions (Addendum)
Take doxycycline as prescribed and complete the full course.  Take prednisone as prescribed for wheezing.  Prednisone can cause your blood sugar to run high, monitor closely, discontinue if this happens and follow-up with your doctor.  Use the inhaler as needed as prescribed, 1 to 2 puffs every 4-6 hours. Take Robaxin as needed prescribed for your back pain.  Recheck with your primary care provider.  You should have a repeat chest x-ray in 6 weeks to make sure the pneumonia has resolved.

## 2021-07-16 ENCOUNTER — Encounter: Payer: Self-pay | Admitting: Physical Medicine and Rehabilitation

## 2021-07-16 ENCOUNTER — Ambulatory Visit: Payer: Managed Care, Other (non HMO) | Admitting: Physical Medicine and Rehabilitation

## 2021-07-16 ENCOUNTER — Other Ambulatory Visit: Payer: Self-pay

## 2021-07-16 VITALS — BP 135/74 | HR 74

## 2021-07-16 DIAGNOSIS — M5416 Radiculopathy, lumbar region: Secondary | ICD-10-CM | POA: Diagnosis not present

## 2021-07-16 DIAGNOSIS — M5442 Lumbago with sciatica, left side: Secondary | ICD-10-CM

## 2021-07-16 DIAGNOSIS — M47816 Spondylosis without myelopathy or radiculopathy, lumbar region: Secondary | ICD-10-CM | POA: Diagnosis not present

## 2021-07-16 DIAGNOSIS — M5441 Lumbago with sciatica, right side: Secondary | ICD-10-CM

## 2021-07-16 DIAGNOSIS — G8929 Other chronic pain: Secondary | ICD-10-CM

## 2021-07-16 DIAGNOSIS — M48061 Spinal stenosis, lumbar region without neurogenic claudication: Secondary | ICD-10-CM

## 2021-07-16 DIAGNOSIS — M5116 Intervertebral disc disorders with radiculopathy, lumbar region: Secondary | ICD-10-CM | POA: Diagnosis not present

## 2021-07-16 NOTE — Progress Notes (Signed)
[name] - 40 y.o. female MRN [id number]  Date of birth: 05-24-1981  Office Visit Note: Visit Date: 07/16/2021 PCP: April Manson, NP Referred by: April Manson, NP  Subjective: Chief Complaint  Patient presents with   Lower Back - Pain   Right Hip - Pain   Left Hip - Pain   Right Knee - Pain   Left Knee - Pain   HPI: [name] is a 40 y.o. female who comes in today per the request of Dr. Vira Browns for evaluation of severe recalcitrant bilateral lower back pain radiating to buttocks/hips and anterior thighs, right greater than left and specifically to discuss spinal cord stimulator options. Patient reports pain is exacerbated by activity and prolonged walking/standing. Patient describes pain as sharp and sore, currently rates as 9 out of 10. Patient reports some pain relief with medications, stretching exercises and rest. Patient's lumbar MRI from 2021 exhibits central to right disc protrusion at L5-S1, bilateral lateral recess stenosis at L4-L5, and non compressive disc bulging at L3-L4. No high grade spinal stenosis. Patient's CT lumbar myelogram from 2021 exhibits multi-level annular bulging, bilateral sacroiliac arthritis, no high grade canal stenosis. Patient states multiple treatments in the past for chronic lower back pain such as trigger point injections, epidural steroid injections, physical therapy and medications, all of which have failed to help or increased her pain.  She had been in chronic pain management in the past.  Patient was treated at [facility] in 2019 where she did receive multiple sets of trigger point injections with minimal relief. Patient currently being treated by Dr. Vira Browns whom recently recommended a 3 level fusion at L3-L4, L4-L5 and L5-S1 from a surgical standpoint, however patient states she is unable to get this procedure approved by her insurance company. Patient states she would like to  discuss spinal cord stimulator device and to determine if she would be a candidate for this procedure. Patient denies focal weakness, numbness and tingling. Patient denies recent trauma or falls.   Patient's course is complicated by diabetes mellitus, neuropathy, bipolar depression with multiple psychotropic medication and alcohol abuse.   Review of Systems  Musculoskeletal:  Positive for back pain.  Neurological:  Negative for tingling, sensory change, focal weakness and weakness.  All other systems reviewed and are negative. Otherwise per HPI.  Assessment & Plan: Visit Diagnoses:    ICD-10-CM   1. Lumbar radiculopathy  M54.16     2. Chronic bilateral low back pain with bilateral sciatica  M54.42    M54.41    G89.29     3. Intervertebral disc disorders with radiculopathy, lumbar region  M51.16     4. Facet hypertrophy of lumbar region  M47.816     5. Stenosis of lateral recess of lumbar spine  M48.061        Plan: Findings:  Chronic, worsening and severe recalcitrant bilateral lower back pain radiating to buttocks, hips and anterior thighs, right greater than left. Patient has failed conservative therapies such as formal physical therapy and medications. Patient also failed to get relief from previous trigger point injections and epidural steroid injections.  She is at  a point where this spinal cord stimulator would be a treatment of last resort.. We did discuss spinal cord stimulator procedure with patient today using educational brochure. We also discussed the requirement of a psychiatric evaluation prior to placement. We explained to patient that the spinal cord stimulator trial lead  placement would be performed in our office, however she would be referred to anesthesiology or neurosurgeon for placement of implant. Patient inquired about narcotic pain medication, we explained to her that we do not manage long-term chronic pain with narcotics in this office, however we would be happy to  refer her to pain management clinic and she could also discuss this with Dr. Otelia Sergeant. We did contact Enrique Sack the representative from [employer] today whom is going to call the patient to address any questions and help her get set up for pretrial psychological evaluation with Dr. Altamease Oiler. Patient states she would like to move forward with the spinal cord stimulator trial. No red flag symptoms noted upon exam today.    Meds & Orders: No orders of the defined types were placed in this encounter.  No orders of the defined types were placed in this encounter.   Follow-up: Return if symptoms worsen or fail to improve.   Procedures: No procedures performed      Clinical History: MRI LUMBAR SPINE WITHOUT CONTRAST  FINDINGS: Segmentation: Standard. Lowest well-formed disc space labeled the L5-S1 level.   Vertebrae: Vertebral body height maintained without acute or chronic fracture. Bone marrow signal intensity within normal limits. No discrete or worrisome osseous lesions. No abnormal marrow edema.   Conus medullaris and cauda equina: Conus extends to the L1 level. Conus and cauda equina appear normal.   Paraspinal and other soft tissues: Unremarkable.   Disc levels:   L1-2:  Unremarkable.   L2-3:  Unremarkable.   L3-4: Mild diffuse disc bulge with disc desiccation. Superimposed small central disc protrusion with annular fissure. Mild facet hypertrophy. No significant spinal stenosis. Foramina remain patent.   L4-5: Disc desiccation with mild disc bulge. Superimposed shallow central disc protrusion with annular fissure. Mild facet hypertrophy. Resultant mild narrowing of the lateral recesses bilaterally. Central canal remains patent. Mild left L4 foraminal narrowing. Right neural foramen remains patent.   L5-S1: Disc desiccation with mild intervertebral disc space narrowing and diffuse disc bulge. Associated mild reactive endplate spurring. Superimposed broad-based central to  right subarticular disc protrusion with slight caudad angulation. Protruding disc contacts the descending right S1 nerve root as it courses through the right lateral recess (series 10, image 34). Disc material also closely approximates the left S1 nerve root sheath without frank impingement. Mild facet hypertrophy. Resultant moderate right with mild left lateral recess stenosis. Central canal remains patent. Mild right L5 foraminal narrowing.   IMPRESSION: 1. Broad central to right subarticular disc protrusion at L5-S1, contacting and potentially irritating the descending right S1 nerve root. 2. Shallow central disc protrusion at L4-5 with resultant mild bilateral lateral recess stenosis. 3. Mild noncompressive disc bulging at L3-4 without stenosis.   Electronically Signed By: Rise Mu M.D. On: 09/29/2020 00:46  LUMBAR MYELOGRAM   FINDINGS: LUMBAR MYELOGRAM FINDINGS:   Small anterior extradural defects at L3-4 and L4-5. flexion extension views do not show any subluxation. No distinct neural compression. Lateral recess narrowing left more than right at L4-5.   CT LUMBAR MYELOGRAM FINDINGS:   No abnormality at L2-3 or above.   L3-4: Annular bulging slightly more towards the left. Slight indentation of the thecal sac but no apparent neural compressive stenosis.   L4-5: Annular bulging more towards the left. Definite neural compression is not established, but there is some potential to affect the L5 nerves in the lateral recesses, particularly the left.   L5-S1: Mild bulging of the disc.  No neural compressive stenosis.  No significant facet arthropathy. No pars defect. Patient does have bilateral sacroiliac osteoarthritis.   IMPRESSION: L3-4: Annular bulging slightly more prominent towards the left. No apparent compressive stenosis.   L4-5: Annular bulging more prominent towards the left. Mild narrowing of the lateral recesses left more than right.  Definite neural compression is not established, but there would be some potential for neural irritation, particularly on the left.   L5-S1: Disc bulge.  No compressive stenosis.   Bilateral sacroiliac osteoarthritis.   Electronically Signed   By: Paulina Fusi M.D.   On: 11/09/2019 12:08   She reports that she has been smoking cigarettes. She has a 11.50 pack-year smoking history. She has never used smokeless tobacco.  Recent Labs    03/09/21 1532  HGBA1C 13.9*    Objective:  VS:  HT:    WT:   BMI:     BP:135/74  HR:74bpm  TEMP: ( )  RESP:  Physical Exam HENT:     Head: Normocephalic and atraumatic.     Right Ear: Tympanic membrane normal.     Left Ear: Tympanic membrane normal.     Nose: Nose normal.     Mouth/Throat:     Mouth: Mucous membranes are moist.  Eyes:     Pupils: Pupils are equal, round, and reactive to light.  Cardiovascular:     Rate and Rhythm: Normal rate.     Pulses: Normal pulses.  Pulmonary:     Effort: Pulmonary effort is normal.  Abdominal:     General: Abdomen is flat. There is no distension.  Musculoskeletal:        General: Tenderness present.     Cervical back: Normal range of motion.     Comments: Pt is slow to rises from seated position to standing without difficulty. Good lumbar range of motion. Strong distal strength without clonus, no pain upon palpation of greater trochanters. Sensation intact bilaterally. Walks with cane, gait slow.     Skin:    General: Skin is warm and dry.     Capillary Refill: Capillary refill takes less than 2 seconds.  Neurological:     General: No focal deficit present.     Mental Status: She is alert.  Psychiatric:        Mood and Affect: Mood normal.    Ortho Exam  Imaging: No results found.  Past Medical/Family/Surgical/Social History: Medications & Allergies reviewed per EMR, new medications updated. 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   Bipolar I disorder, most recent episode depressed (HCC) 06/11/2013   Depression with suicidal ideation 06/09/2013   Alcohol abuse, episodic drinking behavior 06/09/2013   Major depressive disorder, recurrent episode, severe, specified as with psychotic behavior 06/09/2013   Benzodiazepine-based tranquilizers causing adverse effect in therapeutic use 10/19/2011   SNORING 07/18/2009   Ovarian cyst 06/13/2009   HYPERCHOLESTEROLEMIA 09/19/2008   Back pain 09/18/2008   TOBACCO ABUSE 08/13/2008   Insomnia 08/13/2008   Past  Medical History:  Diagnosis Date   Anxiety    Back pain    CAP (community acquired pneumonia)    Chronic pain syndrome    Depression    Diabetes mellitus without complication (HCC)    Gallbladder problem    Obsessive compulsive disorder    Family History  Problem Relation Age of Onset   Diabetes insipidus Father    Arthritis Father    Heart failure Father    COPD Father    Emphysema Father    Asthma Son    Past Surgical History:  Procedure Laterality Date   CESAREAN SECTION     CHOLECYSTECTOMY  2009   wisdom teeth extraction     Social History   Occupational History   Not on file  Tobacco Use   Smoking status: Every Day    Packs/day: 0.50    Years: 23.00    Pack years: 11.50    Types: Cigarettes   Smokeless tobacco: Never  Substance and Sexual Activity   Alcohol use: Yes    Comment: unsure amount (1/6) - states  generally does not drink   Drug use: No   Sexual activity: Yes    Birth control/protection: None

## 2021-07-16 NOTE — Progress Notes (Signed)
Pt state lower back that travels to both hips and knees. Pt state walking, standing and sitting makes the pain worse. Pt state she doesn't take anything for the pain.  Numeric Pain Rating Scale and Functional Assessment Average Pain 9 Pain Right Now 7 My pain is constant, sharp, stabbing, and aching Pain is worse with: walking, sitting, standing, and some activites Pain improves with:  N/A   In the last MONTH (on 0-10 scale) has pain interfered with the following?  1. General activity like being  able to carry out your everyday physical activities such as walking, climbing stairs, carrying groceries, or moving a chair?  Rating(7)  2. Relation with others like being able to carry out your usual social activities and roles such as  activities at home, at work and in your community. Rating(8)  3. Enjoyment of life such that you have  been bothered by emotional problems such as feeling anxious, depressed or irritable?  Rating(9)

## 2021-07-26 ENCOUNTER — Emergency Department (HOSPITAL_BASED_OUTPATIENT_CLINIC_OR_DEPARTMENT_OTHER): Payer: Managed Care, Other (non HMO)

## 2021-07-26 ENCOUNTER — Other Ambulatory Visit: Payer: Self-pay

## 2021-07-26 ENCOUNTER — Emergency Department (HOSPITAL_BASED_OUTPATIENT_CLINIC_OR_DEPARTMENT_OTHER): Payer: Managed Care, Other (non HMO) | Admitting: Radiology

## 2021-07-26 ENCOUNTER — Emergency Department (HOSPITAL_BASED_OUTPATIENT_CLINIC_OR_DEPARTMENT_OTHER)
Admission: EM | Admit: 2021-07-26 | Discharge: 2021-07-27 | Disposition: A | Discharge status: Home / Self Care | Payer: Managed Care, Other (non HMO) | Attending: Emergency Medicine | Admitting: Emergency Medicine

## 2021-07-26 ENCOUNTER — Encounter (HOSPITAL_BASED_OUTPATIENT_CLINIC_OR_DEPARTMENT_OTHER): Payer: Self-pay | Admitting: Obstetrics and Gynecology

## 2021-07-26 DIAGNOSIS — Z7984 Long term (current) use of oral hypoglycemic drugs: Secondary | ICD-10-CM | POA: Insufficient documentation

## 2021-07-26 DIAGNOSIS — Z794 Long term (current) use of insulin: Secondary | ICD-10-CM | POA: Insufficient documentation

## 2021-07-26 DIAGNOSIS — M549 Dorsalgia, unspecified: Secondary | ICD-10-CM | POA: Insufficient documentation

## 2021-07-26 DIAGNOSIS — R059 Cough, unspecified: Secondary | ICD-10-CM | POA: Insufficient documentation

## 2021-07-26 DIAGNOSIS — R600 Localized edema: Secondary | ICD-10-CM | POA: Diagnosis not present

## 2021-07-26 DIAGNOSIS — R062 Wheezing: Secondary | ICD-10-CM | POA: Insufficient documentation

## 2021-07-26 DIAGNOSIS — R609 Edema, unspecified: Secondary | ICD-10-CM

## 2021-07-26 DIAGNOSIS — E119 Type 2 diabetes mellitus without complications: Secondary | ICD-10-CM | POA: Insufficient documentation

## 2021-07-26 DIAGNOSIS — L03116 Cellulitis of left lower limb: Secondary | ICD-10-CM | POA: Insufficient documentation

## 2021-07-26 DIAGNOSIS — F17210 Nicotine dependence, cigarettes, uncomplicated: Secondary | ICD-10-CM | POA: Diagnosis not present

## 2021-07-26 DIAGNOSIS — M25572 Pain in left ankle and joints of left foot: Secondary | ICD-10-CM | POA: Diagnosis present

## 2021-07-26 LAB — CBC
HCT: 43 % (ref 36.0–46.0)
Hemoglobin: 14.6 g/dL (ref 12.0–15.0)
MCH: 30.5 pg (ref 26.0–34.0)
MCHC: 34 g/dL (ref 30.0–36.0)
MCV: 89.8 fL (ref 80.0–100.0)
Platelets: 339 10*3/uL (ref 150–400)
RBC: 4.79 MIL/uL (ref 3.87–5.11)
RDW: 12.9 % (ref 11.5–15.5)
WBC: 9.8 10*3/uL (ref 4.0–10.5)
nRBC: 0 % (ref 0.0–0.2)

## 2021-07-26 LAB — COMPREHENSIVE METABOLIC PANEL
ALT: 11 U/L (ref 0–44)
AST: 9 U/L — ABNORMAL LOW (ref 15–41)
Albumin: 3.6 g/dL (ref 3.5–5.0)
Alkaline Phosphatase: 81 U/L (ref 38–126)
Anion gap: 7 (ref 5–15)
BUN: <5 mg/dL — ABNORMAL LOW (ref 6–20)
CO2: 30 mmol/L (ref 22–32)
Calcium: 9 mg/dL (ref 8.9–10.3)
Chloride: 96 mmol/L — ABNORMAL LOW (ref 98–111)
Creatinine, Ser: 0.54 mg/dL (ref 0.44–1.00)
GFR, Estimated: >60 mL/min (ref >60)
Glucose, Bld: 355 mg/dL — ABNORMAL HIGH (ref 70–99)
Potassium: 3.9 mmol/L (ref 3.5–5.1)
Sodium: 133 mmol/L — ABNORMAL LOW (ref 135–145)
Total Bilirubin: 0.3 mg/dL (ref 0.3–1.2)
Total Protein: 6.2 g/dL — ABNORMAL LOW (ref 6.5–8.1)

## 2021-07-26 LAB — BRAIN NATRIURETIC PEPTIDE: B Natriuretic Peptide: 24.3 pg/mL (ref 0.0–100.0)

## 2021-07-26 LAB — CBG MONITORING, ED: Glucose-Capillary: 359 mg/dL — ABNORMAL HIGH (ref 70–99)

## 2021-07-26 MED ORDER — OXYCODONE-ACETAMINOPHEN 5-325 MG PO TABS
1.0000 | ORAL_TABLET | ORAL | Status: DC | PRN
  Administered 2021-07-26: 1 via ORAL
  Filled 2021-07-26: qty 1

## 2021-07-26 MED ORDER — DOXYCYCLINE HYCLATE 100 MG PO CAPS: 100.0000 mg | ORAL_CAPSULE | Freq: Two times a day (BID) | ORAL | 0 refills | Status: DC

## 2021-07-26 MED ORDER — MORPHINE SULFATE (PF) 4 MG/ML IV SOLN
4.0000 mg | Freq: Once | INTRAVENOUS | Status: AC
  Administered 2021-07-26: 4 mg via INTRAVENOUS
  Filled 2021-07-26: qty 1

## 2021-07-26 MED ORDER — FUROSEMIDE 20 MG PO TABS: 20.0000 mg | ORAL_TABLET | Freq: Every day | ORAL | 0 refills | Status: DC
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
## 2021-07-26 NOTE — ED Notes (Signed)
Pt drinking Tea, with foot long sub in her lap and a bag of potato chips when this RN entered th room. Pt stated that she was a diabetic and she needed to eat. When I ask if she was a type 1 or type 2 diabetic she stated that she was a 1.5, but she didn't know what that was. She stated that she did take insulin. I explained to her why she did not to eat and that worse case scenario if she had to go to surgery she would be at risk for aspiration and pnuemonia. Pt stated that she was aware but she needed to eat. MD notified.Marland Kitchen
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
## 2021-07-26 NOTE — ED Notes (Signed)
Patient placed on 2LNC at this time d/t desaturations into the low 80's on room air. SpO2 currently 94%

## 2021-07-26 NOTE — ED Triage Notes (Signed)
Patient reports to the ER for leg swelling bilaterally and tingling in her right foot. Patient has good pedal pulses on assessment. Reports pain in the left foot with palpation.

## 2021-08-05 ENCOUNTER — Encounter: Payer: Self-pay | Admitting: Specialist

## 2021-08-05 ENCOUNTER — Ambulatory Visit: Payer: Managed Care, Other (non HMO) | Admitting: Specialist

## 2021-08-05 ENCOUNTER — Other Ambulatory Visit: Payer: Self-pay

## 2021-08-05 VITALS — BP 107/72 | HR 88 | Ht 62.0 in | Wt 185.0 lb

## 2021-08-05 DIAGNOSIS — M5116 Intervertebral disc disorders with radiculopathy, lumbar region: Secondary | ICD-10-CM

## 2021-08-05 DIAGNOSIS — M5136 Other intervertebral disc degeneration, lumbar region: Secondary | ICD-10-CM | POA: Diagnosis not present

## 2021-08-05 DIAGNOSIS — G894 Chronic pain syndrome: Secondary | ICD-10-CM | POA: Diagnosis not present

## 2021-08-05 DIAGNOSIS — M5126 Other intervertebral disc displacement, lumbar region: Secondary | ICD-10-CM | POA: Diagnosis not present

## 2021-08-05 DIAGNOSIS — M4807 Spinal stenosis, lumbosacral region: Secondary | ICD-10-CM

## 2021-08-05 MED ORDER — OXYCODONE HCL 5 MG PO CAPS: 10.0000 mg | ORAL_CAPSULE | Freq: Three times a day (TID) | ORAL | 0 refills | Status: DC

## 2021-08-05 NOTE — Patient Instructions (Signed)
Plan: Avoid frequent bending and stooping  No lifting greater than 10 lbs. May use ice or moist heat for pain. Weight loss is of benefit. 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  Exercise is important to improve your indurance and does allow people to function better inspite of back pain.  Well padded shoes help.Ice the knee 2-3 times a day 15-20 mins at a time.-3 times a day 15-20 mins at a time. Hot showers in the AM.  Injection with steroid may be of benefit. Hemp CBD capsules, amazon.com 5,000-7,000 mg per bottle, 60 capsules per bottle, take one capsule twice a day. Undergoing SCS evaluation and is to be scheduled for trial placement.  Follow-Up Instructions: No follow-ups on file.   Follow-Up Instructions: No follow-ups on file.

## 2021-08-05 NOTE — Progress Notes (Signed)
Office Visit Note   Patient: [name]           Date of Birth: 01-30-1981           MRN: [id number] Visit Date: 08/05/2021              Requested by: April Manson, NP [address],  [place] [address] PCP: April Manson, NP   Assessment & Plan: Visit Diagnoses:  1. Degenerative disc disease, lumbar   2. Lumbar disc herniation with radiculopathy   3. Chronic pain syndrome   4. Protrusion of lumbar intervertebral disc   5. Spinal stenosis of lumbosacral region     Plan: Avoid frequent bending and stooping  No lifting greater than 10 lbs. May use ice or moist heat for pain. Weight loss is of benefit. 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  Exercise is important to improve your indurance and does allow people to function better inspite of back pain.  Well padded shoes help.Ice the knee 2-3 times a day 15-20 mins at a time.-3 times a day 15-20 mins at a time. Hot showers in the AM.  Injection with steroid may be of benefit. Hemp CBD capsules, amazon.com 5,000-7,000 mg per bottle, 60 capsules per bottle, take one capsule twice a day. Undergoing SCS evaluation and is to be scheduled for trial placement.  Follow-Up Instructions: No follow-ups on file.   Follow-Up Instructions: No follow-ups on file.   Orders:  No orders of the defined types were placed in this encounter.  No orders of the defined types were placed in this encounter.     Procedures: No procedures performed   Clinical Data: Findings:  Narrative & Impression CLINICAL DATA:  Initial evaluation for chronic low back pain, lumbar radiculopathy.   EXAM: MRI LUMBAR SPINE WITHOUT CONTRAST   TECHNIQUE: Multiplanar, multisequence MR imaging of the lumbar spine was performed. No intravenous contrast  was administered.   COMPARISON:  Previous MRI from 06/15/2019.   FINDINGS: Segmentation: Standard. Lowest well-formed disc space labeled the L5-S1 level.   Alignment: Physiologic with preservation of the normal lumbar lordosis. Underlying trace levoscoliosis.   Vertebrae: Vertebral body height maintained without acute or chronic fracture. Bone marrow signal intensity within normal limits. No discrete or worrisome osseous lesions. No abnormal marrow edema.   Conus medullaris and cauda equina: Conus extends to the L1 level. Conus and cauda equina appear normal.   Paraspinal and other soft tissues: Unremarkable.   Disc levels:   L1-2:  Unremarkable.   L2-3:  Unremarkable.   L3-4: Mild diffuse disc bulge with disc desiccation. Superimposed small central disc protrusion with annular fissure. Mild facet hypertrophy. No significant spinal stenosis. Foramina remain patent.   L4-5: Disc desiccation with mild disc bulge. Superimposed shallow central disc protrusion with annular fissure. Mild facet hypertrophy. Resultant mild narrowing of the lateral recesses bilaterally. Central canal remains patent. Mild left L4 foraminal narrowing. Right neural foramen remains patent.   L5-S1: Disc desiccation with mild intervertebral disc space narrowing and diffuse disc bulge. Associated mild reactive endplate spurring. Superimposed broad-based central to right subarticular disc protrusion with slight caudad angulation. Protruding disc contacts the descending right S1 nerve root as it courses through the right lateral recess (series 10, image 34). Disc material also closely approximates the left S1  nerve root sheath without frank impingement. Mild facet hypertrophy. Resultant moderate right with mild left lateral recess stenosis. Central canal remains patent. Mild right L5 foraminal narrowing.   IMPRESSION: 1. Broad central to right subarticular disc protrusion at L5-S1, contacting and  potentially irritating the descending right S1 nerve root. 2. Shallow central disc protrusion at L4-5 with resultant mild bilateral lateral recess stenosis. 3. Mild noncompressive disc bulging at L3-4 without stenosis.     Electronically Signed   By: Rise Mu M.D.   On: 09/29/2020 00:46       Subjective: Chief Complaint  Patient presents with   Lower Back - Follow-up    40 year old female with history of lumbar pain 6 of 10 worse with bending stooping and lifting. Mopping sweeping, grocery bags and vacuuming, bending to dust. She stopped work about 14 years ago, was Engineer, site of a Psychologist, occupational and also working at [employer], seafood preparation, meat and steaks. She takes no meds, and mainly limits her physical stresses. Stopped pain management, Dr. Lucretia Field with [facility] on [address]. Feels like she is having more pain since stopping meds oxycodone 15 IR TID and she reports the meds worked but the Doctor was difficult.    Review of Systems  Constitutional:  Positive for activity change. Negative for appetite change, chills, diaphoresis, fatigue, fever and unexpected weight change.  HENT: Negative.  Negative for congestion, dental problem, drooling, ear discharge, ear pain, facial swelling, hearing loss, mouth sores, nosebleeds, postnasal drip, rhinorrhea and sinus pain.   Eyes: Negative.  Negative for photophobia, pain, discharge, redness, itching and visual disturbance.  Respiratory: Negative.  Negative for apnea, cough, choking, chest tightness, shortness of breath, wheezing and stridor.   Cardiovascular:  Positive for leg swelling. Negative for chest pain and palpitations.  Gastrointestinal: Negative.  Negative for abdominal distention, abdominal pain, anal bleeding, blood in stool, constipation, diarrhea, nausea, rectal pain and vomiting.  Endocrine: Negative.  Negative for cold intolerance, heat intolerance, polydipsia, polyphagia and polyuria.   Genitourinary: Negative.  Negative for difficulty urinating, dyspareunia, dysuria, enuresis, flank pain, frequency and vaginal discharge.  Musculoskeletal:  Positive for back pain. Negative for arthralgias, gait problem, joint swelling, myalgias, neck pain and neck stiffness.  Skin: Negative.  Negative for color change, pallor, rash and wound.  Allergic/Immunologic: Negative.   Neurological: Negative.  Negative for dizziness, tremors, seizures, syncope, facial asymmetry, speech difficulty, weakness, light-headedness, numbness and headaches.  Hematological: Negative.  Negative for adenopathy. Does not bruise/bleed easily.  Psychiatric/Behavioral: Negative.  Negative for agitation, behavioral problems, confusion, decreased concentration, dysphoric mood, hallucinations, self-injury, sleep disturbance and suicidal ideas. The patient is not nervous/anxious and is not hyperactive.     Objective: Vital Signs: BP 107/72 (BP Location: Right Arm, Patient Position: Sitting, Cuff Size: Normal)   Pulse 88   Ht 5\' 2"  (1.575 m)   Wt 185 lb (83.9 kg)   LMP 07/10/2021   SpO2 95%   BMI 33.84 kg/m   Physical Exam Constitutional:      Appearance: She is well-developed.  HENT:     Head: Normocephalic and atraumatic.  Eyes:     Pupils: Pupils are equal, round, and reactive to light.  Pulmonary:     Effort: Pulmonary effort is normal.     Breath sounds: Normal breath sounds.  Abdominal:     General: Bowel sounds are normal.     Palpations: Abdomen is soft.  Musculoskeletal:     Cervical back: Normal range of motion  and neck supple.     Lumbar back: Negative right straight leg raise test and negative left straight leg raise test.  Skin:    General: Skin is warm and dry.  Neurological:     Mental Status: She is alert and oriented to person, place, and time.  Psychiatric:        Behavior: Behavior normal.        Thought Content: Thought content normal.        Judgment: Judgment normal.    Back  Exam   Tenderness  The patient is experiencing tenderness in the lumbar.  Range of Motion  Extension:  abnormal  Lateral bend right:  abnormal  Lateral bend left:  abnormal  Rotation right:  abnormal  Rotation left:  abnormal   Muscle Strength  Right Quadriceps:  5/5  Left Quadriceps:  5/5  Right Hamstrings:  5/5  Left Hamstrings:  5/5   Tests  Straight leg raise right: negative Straight leg raise left: negative  Reflexes  Patellar:  2/4 Achilles:  2/4 Biceps:  2/4 Babinski's sign: normal   Other  Toe walk: normal Heel walk: normal Sensation: normal Gait: normal  Erythema: no back redness Scars: absent     Specialty Comments:  No specialty comments available.  Imaging: No results found.   PMFS History: Patient Active Problem List   Diagnosis Date Noted   Respiratory failure with hypoxia (HCC) 03/09/2021   Diabetes mellitus without complication (HCC)    Type 2 diabetes mellitus with hyperglycemia (HCC) 09/22/2020   Protrusion of lumbar intervertebral disc 03/31/2020   Obesity (BMI 30.0-34.9) 04/11/2019   Neuropathy 04/11/2019   Hyperlipidemia 04/11/2019   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   Uncontrolled type 2 diabetes mellitus, without long-term current use of insulin 04/11/2019   Overdose of antipsychotic 01/23/2017   Hypoxia 01/17/2017   Diabetes 1.5, 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   Bipolar I disorder, most recent episode depressed (HCC) 06/11/2013   Depression with suicidal ideation 06/09/2013   Alcohol abuse, episodic drinking behavior 06/09/2013   Major depressive disorder, recurrent episode, severe, specified as with psychotic behavior 06/09/2013   Benzodiazepine-based tranquilizers causing adverse effect in therapeutic use 10/19/2011   SNORING 07/18/2009   Ovarian cyst 06/13/2009   HYPERCHOLESTEROLEMIA 09/19/2008   Back pain 09/18/2008   TOBACCO ABUSE 08/13/2008   Insomnia 08/13/2008   Past Medical  History:  Diagnosis Date   Anxiety    Back pain    CAP (community acquired pneumonia)    Chronic pain syndrome    Depression    Diabetes mellitus without complication (HCC)    Gallbladder problem    Obsessive compulsive disorder     Family History  Problem Relation Age of Onset   Diabetes insipidus Father    Arthritis Father    Heart failure Father    COPD Father    Emphysema Father    Asthma Son     Past Surgical History:  Procedure Laterality Date   CESAREAN SECTION     CHOLECYSTECTOMY  2009   wisdom teeth extraction     Social History   Occupational History   Not on file  Tobacco Use   Smoking status: Every Day    Packs/day: 0.50    Years: 23.00    Pack years: 11.50    Types: Cigarettes   Smokeless tobacco: Never  Vaping Use   Vaping Use: Some days   Substances: Nicotine, Flavoring  Substance and Sexual Activity   Alcohol  use: Yes    Comment: unsure amount (1/6) - states generally does not drink   Drug use: No   Sexual activity: Yes    Birth control/protection: None

## 2021-08-24 ENCOUNTER — Telehealth: Payer: Self-pay | Admitting: Physical Medicine and Rehabilitation

## 2021-08-24 NOTE — Telephone Encounter (Signed)
Pt called requesting a call back. Pt states she received a denial in the mail for surgery and would like for Dr. Alvester Morin to appeal there decision. Please call pt at [phone].

## 2021-08-26 NOTE — Telephone Encounter (Signed)
Called patient to advise that peer to peer has been scheduled. No answer and voicemail not set up.

## 2021-08-27 ENCOUNTER — Other Ambulatory Visit: Payer: Self-pay

## 2021-08-27 ENCOUNTER — Encounter (HOSPITAL_BASED_OUTPATIENT_CLINIC_OR_DEPARTMENT_OTHER): Payer: Self-pay

## 2021-08-27 DIAGNOSIS — F17210 Nicotine dependence, cigarettes, uncomplicated: Secondary | ICD-10-CM | POA: Diagnosis not present

## 2021-08-27 DIAGNOSIS — M549 Dorsalgia, unspecified: Secondary | ICD-10-CM | POA: Diagnosis present

## 2021-08-27 DIAGNOSIS — E119 Type 2 diabetes mellitus without complications: Secondary | ICD-10-CM | POA: Insufficient documentation

## 2021-08-27 DIAGNOSIS — Z7984 Long term (current) use of oral hypoglycemic drugs: Secondary | ICD-10-CM | POA: Insufficient documentation

## 2021-08-27 NOTE — ED Triage Notes (Signed)
Patient here POV from Home with Lower Back Pain.  Patient states she has Chronic Lower Back Pain and is attempting to get scheduled for Surgery but this particular exacerbation has been ongoing for approximately 4 hours PTA.   Pain is Lower Back and radiates to Bilateral Hips and Legs.  Patient in Obvious Discomfort during Triage. A&Ox4. GCS 15. Ambulatory. No Fevers. No Urinary Symptoms.

## 2021-08-28 ENCOUNTER — Emergency Department (HOSPITAL_BASED_OUTPATIENT_CLINIC_OR_DEPARTMENT_OTHER)
Admission: EM | Admit: 2021-08-28 | Discharge: 2021-08-28 | Disposition: A | Discharge status: Home / Self Care | Payer: Managed Care, Other (non HMO) | Attending: Emergency Medicine | Admitting: Emergency Medicine

## 2021-08-28 DIAGNOSIS — M549 Dorsalgia, unspecified: Secondary | ICD-10-CM

## 2021-08-28 MED ORDER — HYDROMORPHONE HCL 1 MG/ML IJ SOLN
1.0000 mg | Freq: Once | INTRAMUSCULAR | Status: AC
  Administered 2021-08-28: 1 mg via INTRAMUSCULAR
  Filled 2021-08-28: qty 1

## 2021-08-28 MED ORDER — PREDNISONE 20 MG PO TABS: ORAL_TABLET | ORAL | 0 refills | Status: DC

## 2021-08-28 MED ORDER — METHYLPREDNISOLONE SODIUM SUCC 125 MG IJ SOLR
125.0000 mg | Freq: Once | INTRAMUSCULAR | Status: AC
  Administered 2021-08-28: 125 mg via INTRAMUSCULAR
  Filled 2021-08-28: qty 2

## 2021-08-28 MED ORDER — METHOCARBAMOL 500 MG PO TABS: 500.0000 mg | ORAL_TABLET | Freq: Two times a day (BID) | ORAL | 0 refills | Status: DC

## 2021-08-28 MED ORDER — LIDOCAINE 5 % EX PTCH
1.0000 | MEDICATED_PATCH | CUTANEOUS | Status: DC
  Administered 2021-08-28: 1 via TRANSDERMAL
  Filled 2021-08-28: qty 1

## 2021-08-28 MED ORDER — OXYCODONE HCL 5 MG PO TABS
5.0000 mg | ORAL_TABLET | Freq: Once | ORAL | Status: AC
  Administered 2021-08-28: 5 mg via ORAL
  Filled 2021-08-28: qty 1

## 2021-08-28 MED ORDER — KETOROLAC TROMETHAMINE 60 MG/2ML IM SOLN
30.0000 mg | Freq: Once | INTRAMUSCULAR | Status: AC
  Administered 2021-08-28: 30 mg via INTRAMUSCULAR
  Filled 2021-08-28: qty 2

## 2021-08-28 MED ORDER — DIAZEPAM 5 MG PO TABS
5.0000 mg | ORAL_TABLET | Freq: Once | ORAL | Status: AC
  Administered 2021-08-28: 5 mg via ORAL
  Filled 2021-08-28: qty 1

## 2021-08-28 NOTE — ED Notes (Signed)
Pt seen exiting without discharge paperwork. Pt stopped in lobby by this RN. Discharge instructions including prescription and pain management discussed with pt. Pt verbalized understanding refused discharge vs.

## 2021-08-28 NOTE — ED Provider Notes (Signed)
[facility] EMERGENCY DEPT Provider Note   CSN: [id number] Arrival date & time: 08/27/21  2218     History Chief Complaint  Patient presents with   Back Pain    [name] is a 40 y.o. female.  Patient states that she has L3-4-5 back problems pending fusion based on insurance and she is here with spasming of her left side of her back.  States it radiates to her legs.  No urinary or fecal incontinence.  No fevers.  No recent illnesses.  Worse with movement.  Better with rest.  States that she has not tried thing for symptoms prior to coming here.   Back Pain     Past Medical History:  Diagnosis Date   Anxiety    Back pain    CAP (community acquired pneumonia)    Chronic pain syndrome    Depression    Diabetes mellitus without complication (HCC)    Gallbladder problem    Obsessive compulsive disorder     Patient Active Problem List   Diagnosis Date Noted   Respiratory failure with hypoxia (HCC) 03/09/2021   Diabetes mellitus without complication (HCC)    Type 2 diabetes mellitus with hyperglycemia (HCC) 09/22/2020   Protrusion of lumbar intervertebral disc 03/31/2020   Obesity (BMI 30.0-34.9) 04/11/2019   Neuropathy 04/11/2019   Hyperlipidemia 04/11/2019   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   Uncontrolled type 2 diabetes mellitus, without long-term current use of insulin 04/11/2019   Overdose of antipsychotic 01/23/2017   Hypoxia 01/17/2017   Diabetes 1.5, 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   Bipolar I disorder, most recent episode depressed (HCC) 06/11/2013   Depression with suicidal ideation 06/09/2013   Alcohol abuse, episodic drinking behavior 06/09/2013   Major depressive disorder, recurrent episode, severe, specified as with psychotic behavior 06/09/2013   Benzodiazepine-based tranquilizers causing adverse effect in therapeutic use 10/19/2011   SNORING 07/18/2009   Ovarian cyst 06/13/2009    HYPERCHOLESTEROLEMIA 09/19/2008   Back pain 09/18/2008   TOBACCO ABUSE 08/13/2008   Insomnia 08/13/2008    Past Surgical History:  Procedure Laterality Date   CESAREAN SECTION     CHOLECYSTECTOMY  2009   wisdom teeth extraction       OB History     Gravida  1   Para  1   Term  1   Preterm      AB      Living  1      SAB      IAB      Ectopic      Multiple      Live Births  1           Family History  Problem Relation Age of Onset   Diabetes insipidus Father    Arthritis Father    Heart failure Father    COPD Father    Emphysema Father    Asthma Son     Social History   Tobacco Use   Smoking status: Every Day    Packs/day: 0.50    Years: 23.00    Pack years: 11.50    Types: Cigarettes   Smokeless tobacco: Never  Vaping Use   Vaping Use: Some days   Substances: Nicotine, Flavoring  Substance Use Topics   Alcohol use: Yes    Comment: unsure amount (1/6) - states generally does not drink   Drug use: No    Home Medications Prior to Admission medications   Medication Sig  Start Date End Date Taking? Authorizing Provider  methocarbamol (ROBAXIN) 500 MG tablet Take 1 tablet (500 mg total) by mouth 2 (two) times daily. 08/28/21  Yes Trinda Harlacher, Barbara Cower, MD  predniSONE (DELTASONE) 20 MG tablet 3 tabs po daily x 3 days, then 2 tabs x 3 days, then 1.5 tabs x 3 days, then 1 tab x 3 days, then 0.5 tabs x 3 days 08/28/21  Yes Nickolai Rinks, Barbara Cower, MD  albuterol (PROVENTIL HFA;VENTOLIN HFA) 108 (90 Base) MCG/ACT inhaler Inhale 1-2 puffs into the lungs every 6 (six) hours as needed for wheezing or shortness of breath. 04/17/18   Ivery Quale, PA-C  amphetamine-dextroamphetamine (ADDERALL) 30 MG tablet Take 30 mg by mouth 2 (two) times daily with breakfast and lunch. 09/28/20   [provider]  ARIPiprazole (ABILIFY) 10 MG tablet Take 10 mg by mouth every morning. 01/21/21   [provider]  BD PEN NEEDLE NANO 2ND GEN 32G X 4 MM MISC daily. 09/23/20    [provider]  Budeson-Glycopyrrol-Formoterol (BREZTRI AEROSPHERE) 160-9-4.8 MCG/ACT AERO Inhale 1 puff into the lungs 2 (two) times daily.    [provider]  Cholecalciferol (VITAMIN D3 PO) Take 1 tablet by mouth every morning.    [provider]  Empagliflozin-metFORMIN HCl (SYNJARDY) 12.02-999 MG TABS Take 1 tablet by mouth 2 (two) times daily. 02/12/21   [provider]  ezetimibe (ZETIA) 10 MG tablet Take 1 tablet (10 mg total) by mouth daily. 09/26/17   Bing Neighbors, FNP  gabapentin (NEURONTIN) 100 MG capsule Take 1 capsule (100 mg total) by mouth daily after breakfast. Patient taking differently: Take 100 mg by mouth every morning. 02/14/20   Kerrin Champagne, MD  gabapentin (NEURONTIN) 300 MG capsule Take 1-2 capsules (300-600 mg total) by mouth at bedtime. Only take as prescribed.  Husband is to administer medication Patient taking differently: Take 600 mg by mouth at bedtime. Take only as prescribed. Husband is to administer medication 02/14/20   Kerrin Champagne, MD  lisinopril (PRINIVIL,ZESTRIL) 2.5 MG tablet Take 1 tablet (2.5 mg total) by mouth daily. 05/10/17   Bing Neighbors, FNP  Multiple Vitamin (MULTIVITAMIN WITH MINERALS) TABS tablet Take 1 tablet by mouth every morning.    [provider]  nicotine (NICODERM CQ - DOSED IN MG/24 HOURS) 21 mg/24hr patch Place 21 mg onto the skin daily. 03/03/21   [provider]  Omega-3 Fatty Acids (FISH OIL PO) Take 1 capsule by mouth every morning.    [provider]  pravastatin (PRAVACHOL) 80 MG tablet Take 1 tablet (80 mg total) by mouth daily. Patient taking differently: Take 80 mg by mouth every morning. 05/11/17   Bing Neighbors, FNP  Semaglutide, 1 MG/DOSE, (OZEMPIC, 1 MG/DOSE,) 4 MG/3ML SOPN Inject 1 mg into the skin every Wednesday. 02/12/21   [provider]  sertraline (ZOLOFT) 100 MG tablet Take 2 tablets (200 mg total) by mouth daily. For depression and  anxiety. 06/13/13   Tamala Julian, PA-C  traZODone (DESYREL) 100 MG tablet Take 200 mg by mouth at bedtime. 01/21/21   [provider]    Allergies    Orange oil, Penicillins, Carisoprodol, and Piroxicam  Review of Systems   Review of Systems  Musculoskeletal:  Positive for back pain.  All other systems reviewed and are negative.  Physical Exam Updated Vital Signs BP 131/81   Pulse 88   Temp 98.5 F (36.9 C) (Oral)   Resp 20   Ht 5\' 5"  (1.651  m)   Wt 81.6 kg   SpO2 97%   BMI 29.95 kg/m   Physical Exam Vitals and nursing note reviewed.  Constitutional:      Appearance: She is well-developed.  HENT:     Head: Normocephalic and atraumatic.     Mouth/Throat:     Mouth: Mucous membranes are moist.     Pharynx: Oropharynx is clear.  Eyes:     Pupils: Pupils are equal, round, and reactive to light.  Cardiovascular:     Rate and Rhythm: Normal rate and regular rhythm.  Pulmonary:     Effort: Pulmonary effort is normal. No respiratory distress.     Breath sounds: No stridor.  Abdominal:     General: Abdomen is flat. There is no distension.  Musculoskeletal:        General: Tenderness (left paraspinal) present. No swelling.     Cervical back: Normal range of motion.  Skin:    General: Skin is warm and dry.     Coloration: Skin is not jaundiced or pale.  Neurological:     General: No focal deficit present.     Mental Status: She is alert.     Motor: No weakness.     Coordination: Coordination normal.    ED Results / Procedures / Treatments   Labs (all labs ordered are listed, but only abnormal results are displayed) Labs Reviewed - No data to display  EKG None  Radiology No results found.  Procedures Procedures   Medications Ordered in ED Medications  lidocaine (LIDODERM) 5 % 1 patch (1 patch Transdermal Patch Applied 08/28/21 0321)  diazepam (VALIUM) tablet 5 mg (5 mg Oral Given 08/28/21 0054)  ketorolac (TORADOL) injection 30 mg (30 mg  Intramuscular Given 08/28/21 0055)  oxyCODONE (Oxy IR/ROXICODONE) immediate release tablet 5 mg (5 mg Oral Given 08/28/21 0054)  methylPREDNISolone sodium succinate (SOLU-MEDROL) 125 mg/2 mL injection 125 mg (125 mg Intramuscular Given 08/28/21 0153)  HYDROmorphone (DILAUDID) injection 1 mg (1 mg Intramuscular Given 08/28/21 0321)    ED Course  I have reviewed the triage vital signs and the nursing notes.  Pertinent labs & imaging results that were available during my care of the patient were reviewed by me and considered in my medical decision making (see chart for details).    MDM Rules/Calculators/A&P                         Treated patient with multiple medications in the ER.  She would have to be awoken from sleep to tell us that her pain was very severe.  After ambulating and urinating without difficulty she stated that her pain was unrelieved and was still very severe.  At this time it did not feel like there was any emergent indication for imaging or further treatment.  I discussed the patient that she needed to follow-up with her surgeon as I did not feel there was an emergent cause for symptoms at this time and seem muscular.  I discussed that she did have an MRI previously that did show some nerve irritation and we could try a steroid taper but I did not feel that medicating her further in the ER was in her best interest.  Patient was not happy with this disposition but was discharged and ambulated out of the department without difficulty or antalgic gait.  Final Clinical Impression(s) / ED Diagnoses Final diagnoses:  Acute bilateral back pain, unspecified back location    Rx /  DC Orders ED Discharge Orders          Ordered    methocarbamol (ROBAXIN) 500 MG tablet  2 times daily        08/28/21 0511    predniSONE (DELTASONE) 20 MG tablet        08/28/21 0519             Shakeel Disney, Barbara Cower, MD 08/28/21 [phone]

## 2021-10-08 ENCOUNTER — Other Ambulatory Visit: Payer: Self-pay | Admitting: Radiology

## 2021-10-08 ENCOUNTER — Encounter: Payer: Self-pay | Admitting: Specialist

## 2021-10-16 ENCOUNTER — Ambulatory Visit: Payer: Managed Care, Other (non HMO) | Admitting: Pulmonary Disease

## 2021-10-16 ENCOUNTER — Other Ambulatory Visit: Payer: Self-pay

## 2021-10-16 ENCOUNTER — Encounter: Payer: Self-pay | Admitting: Pulmonary Disease

## 2021-10-16 VITALS — BP 106/68 | HR 109 | Temp 98.1°F | Ht 62.0 in | Wt 204.0 lb

## 2021-10-16 DIAGNOSIS — R0602 Shortness of breath: Secondary | ICD-10-CM

## 2021-10-16 DIAGNOSIS — R053 Chronic cough: Secondary | ICD-10-CM | POA: Diagnosis not present

## 2021-10-16 DIAGNOSIS — R9389 Abnormal findings on diagnostic imaging of other specified body structures: Secondary | ICD-10-CM | POA: Diagnosis not present

## 2021-10-16 LAB — CBC WITH DIFFERENTIAL/PLATELET
Basophils Absolute: 0.1 10*3/uL (ref 0.0–0.1)
Basophils Relative: 0.8 % (ref 0.0–3.0)
Eosinophils Absolute: 0.9 10*3/uL — ABNORMAL HIGH (ref 0.0–0.7)
Eosinophils Relative: 10.9 % — ABNORMAL HIGH (ref 0.0–5.0)
HCT: 42.7 % (ref 36.0–46.0)
Hemoglobin: 14.2 g/dL (ref 12.0–15.0)
Lymphocytes Relative: 34.5 % (ref 12.0–46.0)
Lymphs Abs: 2.8 10*3/uL (ref 0.7–4.0)
MCHC: 33.2 g/dL (ref 30.0–36.0)
MCV: 92 fl (ref 78.0–100.0)
Monocytes Absolute: 0.7 10*3/uL (ref 0.1–1.0)
Monocytes Relative: 8.2 % (ref 3.0–12.0)
Neutro Abs: 3.8 10*3/uL (ref 1.4–7.7)
Neutrophils Relative %: 45.6 % (ref 43.0–77.0)
Platelets: 361 10*3/uL (ref 150.0–400.0)
RBC: 4.63 Mil/uL (ref 3.87–5.11)
RDW: 14.2 % (ref 11.5–15.5)
WBC: 8.2 10*3/uL (ref 4.0–10.5)

## 2021-10-16 NOTE — Progress Notes (Signed)
Harlem Thresher    [id number]    [birth year]  Primary Care Physician:White, Bonnell Public, NP  Referring Physician: April Manson, NP [phone] [address],  [place] [address]  Chief complaint:   Patient being seen for shortness of breath, recurrent pneumonia  HPI:  Gets pneumonia about every year No particular predilection for the particular time of the year An active smoker of about 28 years, about a pack a day  Works in a department store, seafood department with parking department  She does get short of breath with exertion depending on activity history of sleep apnea  1 sibling with a history of asthma  Occasional shortness of breath with wheezing Does have occasional productive cough  She does have 4 dogs but not known to have allergies to dogs  Outpatient Encounter Medications as of 10/16/2021  Medication Sig   albuterol (PROVENTIL HFA;VENTOLIN HFA) 108 (90 Base) MCG/ACT inhaler Inhale 1-2 puffs into the lungs every 6 (six) hours as needed for wheezing or shortness of breath.   amphetamine-dextroamphetamine (ADDERALL) 30 MG tablet Take 30 mg by mouth 2 (two) times daily with breakfast and lunch.   ARIPiprazole (ABILIFY) 10 MG tablet Take 10 mg by mouth every morning.   BD PEN NEEDLE NANO 2ND GEN 32G X 4 MM MISC daily.   Budeson-Glycopyrrol-Formoterol (BREZTRI AEROSPHERE) 160-9-4.8 MCG/ACT AERO Inhale 1 puff into the lungs 2 (two) times daily.   Cholecalciferol (VITAMIN D3 PO) Take 1 tablet by mouth every morning.   Empagliflozin-metFORMIN HCl (SYNJARDY) 12.02-999 MG TABS Take 1 tablet by mouth 2 (two) times daily.   ezetimibe (ZETIA) 10 MG tablet Take 1 tablet (10 mg total) by mouth daily.   gabapentin (NEURONTIN) 100 MG capsule Take 1 capsule (100 mg total) by mouth daily after breakfast. (Patient taking differently: Take 100 mg by mouth every morning.)   gabapentin (NEURONTIN) 300 MG capsule Take 1-2 capsules (300-600 mg total) by mouth at bedtime.  Only take as prescribed.  Husband is to administer medication (Patient taking differently: Take 600 mg by mouth at bedtime. Take only as prescribed. Husband is to administer medication)   lisinopril (PRINIVIL,ZESTRIL) 2.5 MG tablet Take 1 tablet (2.5 mg total) by mouth daily.   methocarbamol (ROBAXIN) 500 MG tablet Take 1 tablet (500 mg total) by mouth 2 (two) times daily.   Multiple Vitamin (MULTIVITAMIN WITH MINERALS) TABS tablet Take 1 tablet by mouth every morning.   nicotine (NICODERM CQ - DOSED IN MG/24 HOURS) 21 mg/24hr patch Place 21 mg onto the skin daily.   Omega-3 Fatty Acids (FISH OIL PO) Take 1 capsule by mouth every morning.   pravastatin (PRAVACHOL) 80 MG tablet Take 1 tablet (80 mg total) by mouth daily. (Patient taking differently: Take 80 mg by mouth every morning.)   predniSONE (DELTASONE) 20 MG tablet 3 tabs po daily x 3 days, then 2 tabs x 3 days, then 1.5 tabs x 3 days, then 1 tab x 3 days, then 0.5 tabs x 3 days   Semaglutide, 1 MG/DOSE, (OZEMPIC, 1 MG/DOSE,) 4 MG/3ML SOPN Inject 1 mg into the skin every Wednesday.   sertraline (ZOLOFT) 100 MG tablet Take 2 tablets (200 mg total) by mouth daily. For depression and anxiety.   traZODone (DESYREL) 100 MG tablet Take 200 mg by mouth at bedtime.   No facility-administered encounter medications on file as of 10/16/2021.    Allergies as of 10/16/2021 - Review Complete 10/16/2021  Allergen Reaction Noted   Orange oil Shortness Of Breath 08/06/2011   Penicillins Itching and Rash 01/30/2021   Carisoprodol Other (See Comments) 08/06/2011   Piroxicam Other (See Comments) 08/06/2011    Past Medical History:  Diagnosis Date   Anxiety    Back pain    CAP (community acquired pneumonia)    Chronic pain syndrome    Depression    Diabetes mellitus without complication (HCC)    Gallbladder problem    Obsessive compulsive disorder     Past Surgical History:  Procedure Laterality Date   CESAREAN SECTION     CHOLECYSTECTOMY  2009    wisdom teeth extraction      Family History  Problem Relation Age of Onset   Diabetes insipidus Father    Arthritis Father    Heart failure Father    COPD Father    Emphysema Father    Asthma Son     Social History   Socioeconomic History   Marital status: Married    Spouse name: Not on file   Number of children: Not on file   Years of education: Not on file   Highest education level: Not on file  Occupational History   Not on file  Tobacco Use   Smoking status: Every Day    Packs/day: 0.50    Years: 23.00    Pack years: 11.50    Types: Cigarettes   Smokeless tobacco: Never  Vaping Use   Vaping Use: Some days   Substances: Nicotine, Flavoring  Substance and Sexual Activity   Alcohol use: Yes    Comment: unsure amount (1/6) - states generally does not drink   Drug use: No   Sexual activity: Yes    Birth control/protection: None  Other Topics Concern   Not on file  Social History Narrative   Not on file   Social Determinants of Health   Financial Resource Strain: Not on file  Food Insecurity: Not on file  Transportation Needs: Not on file  Physical Activity: Not on file  Stress: Not on file  Social Connections: Not on file  Intimate Partner Violence: Not on file    Review of Systems  Respiratory:  Positive for cough and shortness of breath.    Vitals:   10/16/21 1402  BP: 106/68  Pulse: (!) 109  Temp: 98.1 F (36.7 C)  SpO2: 97%     Physical Exam Constitutional:      Appearance: She is obese.  HENT:     Head: Normocephalic.     Mouth/Throat:     Mouth: Mucous membranes are moist.  Cardiovascular:     Rate and Rhythm: Normal rate and regular rhythm.     Heart sounds: No murmur heard.   No friction rub.  Pulmonary:     Effort: No respiratory distress.     Breath sounds: No stridor. No wheezing.  Musculoskeletal:     Cervical back: No rigidity or tenderness.  Neurological:     Mental Status: She is alert.  Psychiatric:        Mood  and Affect: Mood normal.     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  Active smoker -Not thinking about wanting to quit smoking at present -Counseled about the risk to continue to smoke   Abnormal CT scan of chest showing groundglass changes -The best path forward will be for her to be able to  quit smoking -I did discuss the risk with continuing to smoke   Plan/Recommendations: Obtain repeat CT-high-resolution CT would be appropriate  Pulmonary function testing  Obtain blood work including IgM, IgG, IgE, IgA for immunocompromise  Hypersensitivity panel-May have unknown allergens  Follow-up in about 6 weeks   Encouraged to call with any significant concerns  I spent 45 yes minutes dedicated to the care of this patient on the date of this encounter to include previsit review of records, face-to-face time with the patient discussing conditions above, post visit ordering of testing, clinical documentation with electronic health record, making appropriate referrals as documented, and communicated necessary findings to members of the patient's care team  Virl DiamondAdewale Kashden Deboy MD [facility] 10/16/2021, 2:19 PM  CC: April MansonWhite, Marsha L, NP

## 2021-10-16 NOTE — Patient Instructions (Signed)
Blood work -Hypersensitivity panel -IgM, IgG, IgE, IgA levels -CBC with differentials  Pulmonary function test  CT chest-with high-resolution images Concern for-smoking-related interstitial lung disease    Work on quitting smoking  I will see you back in 6 weeks

## 2021-10-19 LAB — IGG, IGA, IGM
IgG (Immunoglobin G), Serum: 739 mg/dL (ref 600–1640)
IgM, Serum: 32 mg/dL — ABNORMAL LOW (ref 50–300)
Immunoglobulin A: 199 mg/dL (ref 47–310)

## 2021-10-19 LAB — IGE: IgE (Immunoglobulin E), Serum: 111 kU/L (ref <=114)

## 2021-10-21 LAB — HYPERSENSITIVITY PNEUMONITIS
A. Pullulans Abs: NEGATIVE
A.Fumigatus #1 Abs: NEGATIVE
Micropolyspora faeni, IgG: NEGATIVE
Pigeon Serum Abs: NEGATIVE
Thermoact. Saccharii: NEGATIVE
Thermoactinomyces vulgaris, IgG: NEGATIVE

## 2021-11-23 ENCOUNTER — Other Ambulatory Visit: Payer: Self-pay

## 2021-11-23 ENCOUNTER — Ambulatory Visit (INDEPENDENT_AMBULATORY_CARE_PROVIDER_SITE_OTHER)
Admission: RE | Admit: 2021-11-23 | Discharge: 2021-11-23 | Disposition: A | Discharge status: Home / Self Care | Payer: Managed Care, Other (non HMO) | Source: Ambulatory Visit | Attending: Pulmonary Disease | Admitting: Pulmonary Disease

## 2021-11-23 DIAGNOSIS — R9389 Abnormal findings on diagnostic imaging of other specified body structures: Secondary | ICD-10-CM

## 2021-11-23 DIAGNOSIS — R0602 Shortness of breath: Secondary | ICD-10-CM | POA: Diagnosis not present

## 2021-11-23 DIAGNOSIS — R053 Chronic cough: Secondary | ICD-10-CM

## 2021-12-02 ENCOUNTER — Encounter: Payer: Self-pay | Admitting: Nurse Practitioner

## 2021-12-02 ENCOUNTER — Emergency Department (HOSPITAL_BASED_OUTPATIENT_CLINIC_OR_DEPARTMENT_OTHER): Payer: Managed Care, Other (non HMO)

## 2021-12-02 ENCOUNTER — Encounter (HOSPITAL_BASED_OUTPATIENT_CLINIC_OR_DEPARTMENT_OTHER): Payer: Self-pay | Admitting: Radiology

## 2021-12-02 ENCOUNTER — Ambulatory Visit (INDEPENDENT_AMBULATORY_CARE_PROVIDER_SITE_OTHER): Payer: Managed Care, Other (non HMO) | Admitting: Pulmonary Disease

## 2021-12-02 ENCOUNTER — Other Ambulatory Visit: Payer: Self-pay

## 2021-12-02 ENCOUNTER — Encounter (HOSPITAL_BASED_OUTPATIENT_CLINIC_OR_DEPARTMENT_OTHER): Payer: Self-pay | Admitting: Urology

## 2021-12-02 ENCOUNTER — Emergency Department (HOSPITAL_BASED_OUTPATIENT_CLINIC_OR_DEPARTMENT_OTHER)
Admission: EM | Admit: 2021-12-02 | Discharge: 2021-12-02 | Disposition: A | Discharge status: Home / Self Care | Payer: Managed Care, Other (non HMO) | Attending: Emergency Medicine | Admitting: Emergency Medicine

## 2021-12-02 ENCOUNTER — Ambulatory Visit: Payer: Managed Care, Other (non HMO) | Admitting: Nurse Practitioner

## 2021-12-02 ENCOUNTER — Ambulatory Visit: Payer: Managed Care, Other (non HMO) | Admitting: Pulmonary Disease

## 2021-12-02 VITALS — BP 116/70 | HR 101 | Temp 97.7°F | Ht 65.0 in | Wt 207.0 lb

## 2021-12-02 DIAGNOSIS — R600 Localized edema: Secondary | ICD-10-CM | POA: Diagnosis not present

## 2021-12-02 DIAGNOSIS — F3130 Bipolar disorder, current episode depressed, mild or moderate severity, unspecified: Secondary | ICD-10-CM | POA: Diagnosis not present

## 2021-12-02 DIAGNOSIS — J45909 Unspecified asthma, uncomplicated: Secondary | ICD-10-CM | POA: Insufficient documentation

## 2021-12-02 DIAGNOSIS — Z79899 Other long term (current) drug therapy: Secondary | ICD-10-CM | POA: Diagnosis not present

## 2021-12-02 DIAGNOSIS — R401 Stupor: Secondary | ICD-10-CM

## 2021-12-02 DIAGNOSIS — J4540 Moderate persistent asthma, uncomplicated: Secondary | ICD-10-CM | POA: Diagnosis not present

## 2021-12-02 DIAGNOSIS — R0602 Shortness of breath: Secondary | ICD-10-CM | POA: Diagnosis not present

## 2021-12-02 DIAGNOSIS — R000 Tachycardia, unspecified: Secondary | ICD-10-CM | POA: Insufficient documentation

## 2021-12-02 DIAGNOSIS — R053 Chronic cough: Secondary | ICD-10-CM

## 2021-12-02 DIAGNOSIS — R9389 Abnormal findings on diagnostic imaging of other specified body structures: Secondary | ICD-10-CM

## 2021-12-02 DIAGNOSIS — R61 Generalized hyperhidrosis: Secondary | ICD-10-CM

## 2021-12-02 DIAGNOSIS — R4182 Altered mental status, unspecified: Secondary | ICD-10-CM | POA: Insufficient documentation

## 2021-12-02 HISTORY — DX: Unspecified asthma, uncomplicated: J45.909

## 2021-12-02 LAB — CBC WITH DIFFERENTIAL/PLATELET
Abs Immature Granulocytes: 0.03 10*3/uL (ref 0.00–0.07)
Basophils Absolute: 0.1 10*3/uL (ref 0.0–0.1)
Basophils Relative: 1 %
Eosinophils Absolute: 0.9 10*3/uL — ABNORMAL HIGH (ref 0.0–0.5)
Eosinophils Relative: 9 %
HCT: 46.3 % — ABNORMAL HIGH (ref 36.0–46.0)
Hemoglobin: 15.5 g/dL — ABNORMAL HIGH (ref 12.0–15.0)
Immature Granulocytes: 0 %
Lymphocytes Relative: 29 %
Lymphs Abs: 3.2 10*3/uL (ref 0.7–4.0)
MCH: 30.2 pg (ref 26.0–34.0)
MCHC: 33.5 g/dL (ref 30.0–36.0)
MCV: 90.3 fL (ref 80.0–100.0)
Monocytes Absolute: 0.7 10*3/uL (ref 0.1–1.0)
Monocytes Relative: 7 %
Neutro Abs: 6 10*3/uL (ref 1.7–7.7)
Neutrophils Relative %: 54 %
Platelets: 386 10*3/uL (ref 150–400)
RBC: 5.13 MIL/uL — ABNORMAL HIGH (ref 3.87–5.11)
RDW: 12.8 % (ref 11.5–15.5)
WBC: 10.9 10*3/uL — ABNORMAL HIGH (ref 4.0–10.5)
nRBC: 0 % (ref 0.0–0.2)

## 2021-12-02 LAB — BASIC METABOLIC PANEL
Anion gap: 12 (ref 5–15)
BUN: 38 mg/dL — ABNORMAL HIGH (ref 6–20)
CO2: 19 mmol/L — ABNORMAL LOW (ref 22–32)
Calcium: 7.9 mg/dL — ABNORMAL LOW (ref 8.9–10.3)
Chloride: 107 mmol/L (ref 98–111)
Creatinine, Ser: 1.7 mg/dL — ABNORMAL HIGH (ref 0.44–1.00)
GFR, Estimated: 38 mL/min — ABNORMAL LOW (ref >60)
Glucose, Bld: 143 mg/dL — ABNORMAL HIGH (ref 70–99)
Potassium: 4.1 mmol/L (ref 3.5–5.1)
Sodium: 138 mmol/L (ref 135–145)

## 2021-12-02 LAB — D-DIMER, QUANTITATIVE: D-Dimer, Quant: 0.51 ug/mL-FEU — ABNORMAL HIGH (ref 0.00–0.50)

## 2021-12-02 LAB — CBG MONITORING, ED: Glucose-Capillary: 184 mg/dL — ABNORMAL HIGH (ref 70–99)

## 2021-12-02 LAB — TROPONIN I (HIGH SENSITIVITY): Troponin I (High Sensitivity): <2 ng/L (ref <18)

## 2021-12-02 LAB — BRAIN NATRIURETIC PEPTIDE: B Natriuretic Peptide: 15.5 pg/mL (ref 0.0–100.0)

## 2021-12-02 MED ORDER — HYDROMORPHONE HCL 1 MG/ML IJ SOLN: 1.0000 mg | Freq: Once | INTRAMUSCULAR | Status: DC

## 2021-12-02 MED ORDER — MONTELUKAST SODIUM 10 MG PO TABS: 10.0000 mg | ORAL_TABLET | Freq: Every day | ORAL | 5 refills | Status: DC

## 2021-12-02 MED ORDER — LORATADINE 10 MG PO TABS: 10.0000 mg | ORAL_TABLET | Freq: Every day | ORAL | 4 refills | Status: DC

## 2021-12-02 MED ORDER — IOHEXOL 350 MG/ML SOLN
60.0000 mL | Freq: Once | INTRAVENOUS | Status: AC | PRN
Start: 2021-12-02 — End: 2021-12-02
  Administered 2021-12-02: 60 mL via INTRAVENOUS

## 2021-12-02 MED ORDER — FLUTICASONE-SALMETEROL 250-50 MCG/ACT IN AEPB: 1.0000 | INHALATION_SPRAY | Freq: Two times a day (BID) | RESPIRATORY_TRACT | 5 refills | Status: DC

## 2021-12-02 MED ORDER — SODIUM CHLORIDE 0.9 % IV BOLUS
500.0000 mL | Freq: Once | INTRAVENOUS | Status: AC
  Administered 2021-12-02: 500 mL via INTRAVENOUS

## 2021-12-02 MED ORDER — ALBUTEROL SULFATE HFA 108 (90 BASE) MCG/ACT IN AERS: 1.0000 | INHALATION_SPRAY | Freq: Four times a day (QID) | RESPIRATORY_TRACT | 0 refills | Status: DC | PRN

## 2021-12-02 NOTE — ED Notes (Signed)
RT educated pt on importance of PFT my pulmonology and possible sleep study. Pt falling asleep and desaturating while sitting up and unable to have conversation w/out RT arousing her. Pt sats w/in normal range while awake. Pt respiratory status stable w/no distress noted at this time. RT also educated pt on smoking cessation and various options for quitting. Pt verbalizes understanding of teaching.

## 2021-12-02 NOTE — ED Notes (Addendum)
Placed Pt on 2L RT notified. Pt was at 86-88% with a good pleth. Pain medication held until secondary assessment by RT

## 2021-12-02 NOTE — ED Provider Notes (Signed)
[facility] EMERGENCY DEPT Provider Note   CSN: [id number] Arrival date & time: 12/02/21  1705     History  Chief Complaint  Patient presents with   Abnormal ECG    [name] is a 41 y.o. female with a history of asthma, chronic smoking, presenting from her pulmonology clinic with concern for "abnormal EKG."  The patient reports she went to her office visit today and was told that "my heart is irregular on my EKG" and told to come to the ER.  She denies any chest pressure or pain.  She denies any new shortness of breath.  She has chronic back pain from bulging disks.  She denies history of DVT or PE, estrogen use, recent surgery, prolonged immobility.  She does report for the past month she has had pitting edema of both lower legs  HPI     Home Medications Prior to Admission medications   Medication Sig Start Date End Date Taking? Authorizing Provider  albuterol (VENTOLIN HFA) 108 (90 Base) MCG/ACT inhaler Inhale 1-2 puffs into the lungs every 6 (six) hours as needed for wheezing or shortness of breath. 12/02/21   Cobb, Ruby Cola, NP  amphetamine-dextroamphetamine (ADDERALL) 30 MG tablet Take 30 mg by mouth 2 (two) times daily with breakfast and lunch. 09/28/20   [provider]  ARIPiprazole (ABILIFY) 10 MG tablet Take 10 mg by mouth every morning. 01/21/21   [provider]  BD PEN NEEDLE NANO 2ND GEN 32G X 4 MM MISC daily. 09/23/20   [provider]  Cholecalciferol (VITAMIN D3 PO) Take 1 tablet by mouth every morning.    [provider]  Empagliflozin-metFORMIN HCl (SYNJARDY) 12.02-999 MG TABS Take 1 tablet by mouth 2 (two) times daily. 02/12/21   [provider]  ezetimibe (ZETIA) 10 MG tablet Take 1 tablet (10 mg total) by mouth daily. 09/26/17   Bing Neighbors, FNP  fluticasone-salmeterol (ADVAIR) 250-50 MCG/ACT AEPB Inhale 1 puff into the lungs every 12 (twelve) hours. 12/02/21   Cobb, Ruby Cola,  NP  gabapentin (NEURONTIN) 100 MG capsule Take 1 capsule (100 mg total) by mouth daily after breakfast. Patient taking differently: Take 100 mg by mouth every morning. 02/14/20   Kerrin Champagne, MD  gabapentin (NEURONTIN) 300 MG capsule Take 1-2 capsules (300-600 mg total) by mouth at bedtime. Only take as prescribed.  Husband is to administer medication Patient taking differently: Take 600 mg by mouth at bedtime. Take only as prescribed. Husband is to administer medication 02/14/20   Kerrin Champagne, MD  lisinopril (PRINIVIL,ZESTRIL) 2.5 MG tablet Take 1 tablet (2.5 mg total) by mouth daily. 05/10/17   Bing Neighbors, FNP  loratadine (CLARITIN) 10 MG tablet Take 1 tablet (10 mg total) by mouth daily. 12/02/21   Cobb, Ruby Cola, NP  methocarbamol (ROBAXIN) 500 MG tablet Take 1 tablet (500 mg total) by mouth 2 (two) times daily. 08/28/21   Mesner, Barbara Cower, MD  montelukast (SINGULAIR) 10 MG tablet Take 1 tablet (10 mg total) by mouth at bedtime. 12/02/21   Cobb, Ruby Cola, NP  Multiple Vitamin (MULTIVITAMIN WITH MINERALS) TABS tablet Take 1 tablet by mouth every morning.    [provider]  Omega-3 Fatty Acids (FISH OIL PO) Take 1 capsule by mouth every morning.    [provider]  pravastatin (PRAVACHOL) 80 MG tablet Take 1 tablet (80 mg total) by mouth daily. Patient taking differently: Take 80 mg by mouth every morning. 05/11/17   Tiburcio Pea,  Godfrey Pick, FNP  Semaglutide, 1 MG/DOSE, (OZEMPIC, 1 MG/DOSE,) 4 MG/3ML SOPN Inject 1 mg into the skin every Wednesday. 02/12/21   [provider]  sertraline (ZOLOFT) 100 MG tablet Take 2 tablets (200 mg total) by mouth daily. For depression and anxiety. 06/13/13   Tamala Julian, PA-C  traZODone (DESYREL) 100 MG tablet Take 200 mg by mouth at bedtime. 01/21/21   [provider]      Allergies    Orange oil, Penicillins, Carisoprodol, and Piroxicam    Review of Systems   Review of Systems  Physical Exam Updated Vital Signs BP  128/62    Pulse 82    Temp 98 F (36.7 C) (Oral)    Resp 14    Ht 5\' 5"  (1.651 m)    Wt 93.9 kg    LMP 11/21/2021    SpO2 94%    BMI 34.44 kg/m  Physical Exam Constitutional:      General: She is not in acute distress. HENT:     Head: Normocephalic and atraumatic.  Eyes:     Conjunctiva/sclera: Conjunctivae normal.     Pupils: Pupils are equal, round, and reactive to light.  Cardiovascular:     Rate and Rhythm: Regular rhythm. Tachycardia present.     Pulses: Normal pulses.  Pulmonary:     Effort: Pulmonary effort is normal. No respiratory distress.  Abdominal:     General: There is no distension.     Tenderness: There is no abdominal tenderness.  Musculoskeletal:     Right lower leg: Edema present.     Left lower leg: Edema present.  Skin:    General: Skin is warm and dry.  Neurological:     General: No focal deficit present.     Mental Status: She is alert. Mental status is at baseline.  Psychiatric:        Mood and Affect: Mood normal.        Behavior: Behavior normal.    ED Results / Procedures / Treatments   Labs (all labs ordered are listed, but only abnormal results are displayed) Labs Reviewed  BASIC METABOLIC PANEL - Abnormal; Notable for the following components:      Result Value   CO2 19 (*)    Glucose, Bld 143 (*)    BUN 38 (*)    Creatinine, Ser 1.70 (*)    Calcium 7.9 (*)    GFR, Estimated 38 (*)    All other components within normal limits  CBC WITH DIFFERENTIAL/PLATELET - Abnormal; Notable for the following components:   WBC 10.9 (*)    RBC 5.13 (*)    Hemoglobin 15.5 (*)    HCT 46.3 (*)    Eosinophils Absolute 0.9 (*)    All other components within normal limits  D-DIMER, QUANTITATIVE - Abnormal; Notable for the following components:   D-Dimer, Quant 0.51 (*)    All other components within normal limits  CBG MONITORING, ED - Abnormal; Notable for the following components:   Glucose-Capillary 184 (*)    All other components within normal  limits  BRAIN NATRIURETIC PEPTIDE  TROPONIN I (HIGH SENSITIVITY)    EKG EKG Interpretation  Date/Time:  Wednesday December 02 2021 17:18:31 EST Ventricular Rate:  99 PR Interval:  151 QRS Duration: 77 QT Interval:  349 QTC Calculation: 448 R Axis:   92 Text Interpretation: Sinus rhythm Right atrial enlargement Borderline right axis deviation Low voltage, precordial leads Borderline T abnormalities, anterior leads Confirmed by [id number] (  [id number]) on 12/02/2021 5:42:19 PM  Radiology CT Angio Chest PE W and/or Wo Contrast  Result Date: 12/02/2021 CLINICAL DATA:  Pulmonary embolism (PE) suspected, high prob. Seen at pulmonology and sent here due to irregular EKG ( [facility]) States SOB and lower back pain that is chronic Denies any chest pain Denies any other associated symptoms EXAM: CT ANGIOGRAPHY CHEST WITH CONTRAST TECHNIQUE: Multidetector CT imaging of the chest was performed using the standard protocol during bolus administration of intravenous contrast. Multiplanar CT image reconstructions and MIPs were obtained to evaluate the vascular anatomy. RADIATION DOSE REDUCTION: This exam was performed according to the departmental dose-optimization program which includes automated exposure control, adjustment of the mA and/or kV according to patient size and/or use of iterative reconstruction technique. CONTRAST:  83mL OMNIPAQUE IOHEXOL 350 MG/ML SOLN COMPARISON:  CT chest 11/23/2020, CT chest 03/09/2021, CT angiography chest 02/06/2014 FINDINGS: Cardiovascular: Satisfactory opacification of the pulmonary arteries to the segmental level. No evidence of pulmonary embolism. Main pulmonary artery is normal in caliber. Normal heart size. No significant pericardial effusion. The thoracic aorta is normal in caliber. No atherosclerotic plaque of the thoracic aorta. No coronary artery calcifications. Mediastinum/Nodes: There is a 1.5 cm right hilar lymph node (5:101). No enlarged mediastinal,  hilar, or axillary lymph nodes. Thyroid gland, trachea, and esophagus demonstrate no significant findings. Lungs/Pleura: Expiratory phase of respiration. No focal consolidation. No pulmonary nodule. No pulmonary mass. No pleural effusion. No pneumothorax. Upper Abdomen: Status post cholecystectomy.  No acute abnormality. Musculoskeletal: No chest wall abnormality. No suspicious lytic or blastic osseous lesions. No acute displaced fracture. Review of the MIP images confirms the above findings. IMPRESSION: 1. No pulmonary embolus. 2. No acute intrapulmonary abnormality. 3. Nonspecific right hilar lymphadenopathy. Electronically Signed   By: Tish Frederickson M.D.   On: 12/02/2021 20:01    Procedures Procedures    Medications Ordered in ED Medications  sodium chloride 0.9 % bolus 500 mL (0 mLs Intravenous Stopped 12/02/21 2033)  iohexol (OMNIPAQUE) 350 MG/ML injection 60 mL (60 mLs Intravenous Contrast Given 12/02/21 1928)    ED Course/ Medical Decision Making/ A&P Clinical Course as of 12/03/21 1660  Wed Dec 02, 2021  1925 Patient advised of positive D-dimer test and advancing to PE study at this time [MT]  2006   IMPRESSION: 1. No pulmonary embolus. 2. No acute intrapulmonary abnormality. 3. Nonspecific right hilar lymphadenopathy. [MT]    Clinical Course User Index [MT] Leidy Massar, Kermit Balo, MD                           Medical Decision Making Amount and/or Complexity of Data Reviewed Labs: ordered. Radiology: ordered.  Risk Prescription drug management.    I reviewed her NP office note from this afternoon, which reports concerns for possible ventricular bigeminy on EKG and frequent PVCs and prolonged QTc.  However there was significant motion artifact on this EKG, per my own review of the office EKG, and I suspect there is some machine error in these calculations  Her EKG on arrival per my interpretation shows sinus rhythm, borderline tachycardia with heart rate of 99, borderline  right axis deviation which may be physiological for her age.  No acute ischemic findings.  Qtc wnl.  From her prior EKGs it appears that she does have baseline resting tachycardia.  However with her leg swelling, I do think is reasonable to perform a PE work-up at this time.  We discussed a D-dimer test  and she is agreeable.  We can also check her lecture levels and cardiac enzymes.  Labs and imaging reviewed and personally interpreted - likely some prerenal/dehydration AKI component, BUN Cr elevated from priors, patient drinks lots of caffeine - IV fluid bolus given here and advised she needs to drink more water at home. Ddimer 0.51, mildly elevated - subsequent CT PE study per my interpretation with no acute PE, mild nonspecific reactive lymph node in chest scan.   Trop < 2.  Doubt ACS BNP wnl - no evidence of pulm edema or congestion.  Lower suspicion for acute CHF. Hgb wnl - doubt anemia  Patient briefly hypoxic while sleeping in ED, reports she has sleep apnea, which is likely cause.  No hypoxia while awake.        Final Clinical Impression(s) / ED Diagnoses Final diagnoses:  Tachycardia    Rx / DC Orders ED Discharge Orders     None         Aiven Kampe, Kermit BaloMatthew J, MD 12/03/21 [phone]

## 2021-12-02 NOTE — ED Notes (Signed)
Bi lateral +1 piting edema LE

## 2021-12-02 NOTE — Progress Notes (Signed)
PFT done today. 

## 2021-12-02 NOTE — Assessment & Plan Note (Addendum)
Initially stuporous; aroused with minimal intervention. Diaphoretic. Stabilized after a few minutes and was pleasant and interactive; A&Ox3. BS was elevated in 200's, pt reported that was a relatively normal result for her. EKG with ventricular bigeminy and frequent PVCs as well as prolonged QTc, which were new findings although there was artifact present. Advised she seek further evaluation at the ED. She was agreeable to go but declined EMS transport. Pt contacted a friend who agreed to take her straight to the ED from the office.

## 2021-12-02 NOTE — Assessment & Plan Note (Signed)
PFTs today with mild obstruction and reversibility. DLCO with mild diffusion defect (77%). There was air trapping noted on HRCT. Consistent with asthma diagnosis. Eosinophils also significantly elevated on recent CBC. Initiated therapy with ICS/LABA and singulair. Advised to also use claritin for allergies. Albuterol PRN refilled. Close follow up with repeat CBC with diff to evaluate eosinophils.   Patient Instructions  Start Advair 250 1 puff Twice daily. Brush tongue and rinse mouth afterwards  Start Claritin 10 mg daily for allergies  Start singulair 10 mg At bedtime  -Continue Albuterol inhaler 2 puffs every 6 hours as needed for shortness of breath or wheezing. Notify if symptoms persist despite rescue inhaler/neb use.  Asthma Action Plan in place Rinse mouth after inhaled corticosteroid use.  Avoid triggers, when able.  Exercise encouraged. Notify if worsening symptoms upon exertion.  Notify and seek help if symptoms unrelieved by rescue inhaler.  Advise further care in emergency department given rhythm changes and profuse sweating with symptoms of fatigue   Follow up in one month with Dr. Wynona Neat or Philis Nettle. If symptoms do not improve or worsen, please contact office for sooner follow up or seek emergency care.

## 2021-12-02 NOTE — ED Notes (Signed)
Consulted with MD r/t pt

## 2021-12-02 NOTE — ED Notes (Signed)
Able to sit up at bedside once awake and able to dress herself.  Will call an Melburn Popper from the lobby, does not have a care here.

## 2021-12-02 NOTE — Progress Notes (Addendum)
@Patient  ID: [name], female    DOB: 10-12-1980, 41 y.o.   MRN: [id number]  Chief Complaint  Patient presents with   Follow-up    She reports she is doing well overall and will occasionally have short of breath.     Referring provider: Jettie Booze, NP  HPI: 41 year old female, current every day smoker followed for newly diagnosed moderate persistent asthma. She is a patient of Dr. Judson Roch and last seen in office on 10/16/2021. Past medical history significant for DM, neuropathy, HLD, insomnia, alcohol abuse, MDD with previous SI, bipolar, OD on antipsychotics, obesity, ADHD.   TEST/EVENTS:  03/10/2021 echocardiogram: EF 60 to 65%.  Overall normal exam without any valvular dysfunction. 11/23/2021 HRCT chest: There was no evidence of changes to suggest ILD.  Mild air trapping was noted indicative of small airways disease. 12/02/2021 PFTs: FCV 3.05 (80), FEV1 2.52 (81), ratio 83, TLC 92%, DLCO 77% corrected for alveolar volume.  Positive bronchodilator response (12% change)  10/16/2021: OV with Dr. Ander Slade. SOB with exertion and occasional productive cough. PFTs ordered. HRCT for ILD work up. Hypersensitivity workup - significant eosinophilia 0.9. IgE was nl. HRCT with no evidence of ILD; presence of air trapping consistent with small airways disease.   12/02/2021: Today-follow-up  Patient presents today for follow-up after pulmonary function testing consistent with asthma.  Given her symptoms and requirement of rescue inhaler, she is consistent with moderate persistent asthma.  She continues to occasionally have shortness of breath and does note some environmental triggers.  She has an occasional cough that she relates relates to smoking and PND.  She does notice an occasional wheeze that resolves with albuterol or on its own.  She denies any orthopnea,chest pain, lower extremity edema.  It is documented in her chart that she was prescribed Breztri at some point however patient  is unsure what this is and has never used it.  She does use rescue inhaler frequently.  Initially upon exam, patient was found to be asleep when NP entered exam room. Aroused easily but was diffusely diaphoretic. She had no other symptoms and VS were stable. She is a diabetic so we checked her sugar which was 201. Her diaphoresis subsided and she said she was just tired because she didn't sleep well last night. She communicated well and was not in any acute distress. We discussed her pulmonary function test, consistent with asthma, and obtained an EKG due to diaphoresis which showed ventricular bigeminy, frequent PVCs and prolonged QTc. She was advised to go to the ED for further eval.   Allergies  Allergen Reactions   Orange Oil Shortness Of Breath   Penicillins Itching and Rash   Carisoprodol Other (See Comments)    Skin peeled    Piroxicam Other (See Comments)    Skin peeled    Immunization History  Administered Date(s) Administered   Influenza Whole 09/18/2008, 07/18/2009   Influenza, Quadrivalent, Recombinant, Inj, Pf 07/24/2018, 06/27/2019, 09/04/2020   Influenza,inj,Quad PF,6+ Mos 06/10/2017   Influenza,inj,Quad PF,6-35 Mos 08/17/2019   Influenza,inj,quad, With Preservative 07/24/2018, 06/27/2019   Influenza-Unspecified 09/09/2020, 07/23/2021   PFIZER Comirnaty(Gray Top)Covid-19 Tri-Sucrose Vaccine 11/29/2020, 12/20/2020   Pneumococcal Polysaccharide-23 01/23/2018   Td 09/18/2008   Tdap 09/18/2011    Past Medical History:  Diagnosis Date   Anxiety    Asthma    Back pain    CAP (community acquired pneumonia)    Chronic pain syndrome    Depression    Diabetes mellitus without  complication ([place])    Gallbladder problem    Obsessive compulsive disorder     Tobacco History: Social History   Tobacco Use  Smoking Status Every Day   Packs/day: 0.50   Years: 23.00   Pack years: 11.50   Types: Cigarettes  Smokeless Tobacco Never   Ready to quit: Not  Answered Counseling given: Not Answered   Outpatient Medications Prior to Visit  Medication Sig Dispense Refill   amphetamine-dextroamphetamine (ADDERALL) 30 MG tablet Take 30 mg by mouth 2 (two) times daily with breakfast and lunch.     ARIPiprazole (ABILIFY) 10 MG tablet Take 10 mg by mouth every morning.     BD PEN NEEDLE NANO 2ND GEN 32G X 4 MM MISC daily.     Cholecalciferol (VITAMIN D3 PO) Take 1 tablet by mouth every morning.     Empagliflozin-metFORMIN HCl (SYNJARDY) 12.02-999 MG TABS Take 1 tablet by mouth 2 (two) times daily.     ezetimibe (ZETIA) 10 MG tablet Take 1 tablet (10 mg total) by mouth daily. 90 tablet 3   gabapentin (NEURONTIN) 100 MG capsule Take 1 capsule (100 mg total) by mouth daily after breakfast. (Patient taking differently: Take 100 mg by mouth every morning.) 90 capsule 3   gabapentin (NEURONTIN) 300 MG capsule Take 1-2 capsules (300-600 mg total) by mouth at bedtime. Only take as prescribed.  Husband is to administer medication (Patient taking differently: Take 600 mg by mouth at bedtime. Take only as prescribed. Husband is to administer medication) 90 capsule 3   lisinopril (PRINIVIL,ZESTRIL) 2.5 MG tablet Take 1 tablet (2.5 mg total) by mouth daily. 90 tablet 2   methocarbamol (ROBAXIN) 500 MG tablet Take 1 tablet (500 mg total) by mouth 2 (two) times daily. 20 tablet 0   Multiple Vitamin (MULTIVITAMIN WITH MINERALS) TABS tablet Take 1 tablet by mouth every morning.     Omega-3 Fatty Acids (FISH OIL PO) Take 1 capsule by mouth every morning.     pravastatin (PRAVACHOL) 80 MG tablet Take 1 tablet (80 mg total) by mouth daily. (Patient taking differently: Take 80 mg by mouth every morning.) 90 tablet 3   Semaglutide, 1 MG/DOSE, (OZEMPIC, 1 MG/DOSE,) 4 MG/3ML SOPN Inject 1 mg into the skin every Wednesday.     sertraline (ZOLOFT) 100 MG tablet Take 2 tablets (200 mg total) by mouth daily. For depression and anxiety. 60 tablet 0   traZODone (DESYREL) 100 MG tablet  Take 200 mg by mouth at bedtime.     albuterol (PROVENTIL HFA;VENTOLIN HFA) 108 (90 Base) MCG/ACT inhaler Inhale 1-2 puffs into the lungs every 6 (six) hours as needed for wheezing or shortness of breath. 1 Inhaler 0   Budeson-Glycopyrrol-Formoterol (BREZTRI AEROSPHERE) 160-9-4.8 MCG/ACT AERO Inhale 1 puff into the lungs 2 (two) times daily. (Patient not taking: Reported on 12/02/2021)     nicotine (NICODERM CQ - DOSED IN MG/24 HOURS) 21 mg/24hr patch Place 21 mg onto the skin daily. (Patient not taking: Reported on 12/02/2021)     predniSONE (DELTASONE) 20 MG tablet 3 tabs po daily x 3 days, then 2 tabs x 3 days, then 1.5 tabs x 3 days, then 1 tab x 3 days, then 0.5 tabs x 3 days (Patient not taking: Reported on 12/02/2021) 27 tablet 0   No facility-administered medications prior to visit.     Review of Systems:   Constitutional: No weight loss or gain, night sweats, fevers, chills. +fatigue; diaphoresis, resolved after a few minutes HEENT: No headaches, difficulty swallowing,  tooth/dental problems, or sore throat. No sneezing, itching, ear ache, nasal congestion, or post nasal drip CV:  +PND. No chest pain, orthopnea, swelling in lower extremities, anasarca, dizziness, palpitations, syncope Resp: +shortness of breath with exertion, occasionally at rest; occasional productive cough with clear to white sputum; rare wheeze. No excess mucus or change in color of mucus. No hemoptysis. No chest wall deformity GI:  No heartburn, indigestion, abdominal pain, nausea, vomiting, diarrhea, change in bowel habits, loss of appetite, bloody stools.  GU: No dysuria, change in color of urine, urgency or frequency.  No flank pain, no hematuria  Skin: No rash, lesions, ulcerations MSK:  No joint pain or swelling.  No decreased range of motion.  No back pain. Neuro: No dizziness or lightheadedness.  Psych: No depression or anxiety. Mood stable.     Physical Exam:  BP 116/70 (BP Location: Left Arm, Patient  Position: Sitting, Cuff Size: Normal)    Pulse (!) 101    Temp 97.7 F (36.5 C) (Oral)    Ht 5\' 5"  (1.651 m)    Wt 207 lb (93.9 kg)    LMP 11/21/2021    SpO2 97%    BMI 34.45 kg/m   GEN: Initially patient appeared lethargic and diaphoretic; aroused easily and improved within a few minutes and was pleasant, interactive, well-nourished; in no acute distress.  HEENT:  Normocephalic and atraumatic. EACs patent bilaterally. TM pearly gray with present light reflex bilaterally. PERRLA. Sclera white. Nasal turbinates pink, moist and patent bilaterally. No rhinorrhea present. Oropharynx pink and moist, without exudate or edema. No lesions, ulcerations, or postnasal drip.  NECK:  Supple w/ fair ROM. No JVD present. Normal carotid impulses w/o bruits. Thyroid symmetrical with no goiter or nodules palpated. No lymphadenopathy.   CV: RRR, no m/r/g, no peripheral edema. Pulses intact, +2 bilaterally. No cyanosis, pallor or clubbing. PULMONARY:  Unlabored, regular breathing. Clear bilaterally A&P w/o wheezes/rales/rhonchi. No accessory muscle use. No dullness to percussion. GI: BS present and normoactive. Soft, non-tender to palpation. No organomegaly or masses detected. No CVA tenderness. MSK: No erythema, warmth or tenderness. Cap refil <2 sec all extrem. No deformities or joint swelling noted.  Neuro: A/Ox3. No focal deficits noted.   Skin: Warm, no lesions or rashe Psych: Normal affect and behavior. Judgement and thought content appropriate.     Lab Results:  CBC    Component Value Date/Time   WBC 10.9 (H) 12/02/2021 1800   RBC 5.13 (H) 12/02/2021 1800   HGB 15.5 (H) 12/02/2021 1800   HCT 46.3 (H) 12/02/2021 1800   PLT 386 12/02/2021 1800   MCV 90.3 12/02/2021 1800   MCH 30.2 12/02/2021 1800   MCHC 33.5 12/02/2021 1800   RDW 12.8 12/02/2021 1800   LYMPHSABS 3.2 12/02/2021 1800   MONOABS 0.7 12/02/2021 1800   EOSABS 0.9 (H) 12/02/2021 1800   BASOSABS 0.1 12/02/2021 1800    BMET     Component Value Date/Time   NA 138 12/02/2021 1800   K 4.1 12/02/2021 1800   CL 107 12/02/2021 1800   CO2 19 (L) 12/02/2021 1800   GLUCOSE 143 (H) 12/02/2021 1800   BUN 38 (H) 12/02/2021 1800   CREATININE 1.70 (H) 12/02/2021 1800   CREATININE 0.75 05/10/2017 1000   CALCIUM 7.9 (L) 12/02/2021 1800   GFRNONAA 38 (L) 12/02/2021 1800   GFRNONAA >89 05/10/2017 1000   GFRAA >60 04/16/2018 2320   GFRAA >89 05/10/2017 1000    BNP    Component Value Date/Time  BNP 15.5 12/02/2021 1800     Imaging:  CT Angio Chest PE W and/or Wo Contrast  Result Date: 12/02/2021 CLINICAL DATA:  Pulmonary embolism (PE) suspected, high prob. Seen at pulmonology and sent here due to irregular EKG ( [facility]) States SOB and lower back pain that is chronic Denies any chest pain Denies any other associated symptoms EXAM: CT ANGIOGRAPHY CHEST WITH CONTRAST TECHNIQUE: Multidetector CT imaging of the chest was performed using the standard protocol during bolus administration of intravenous contrast. Multiplanar CT image reconstructions and MIPs were obtained to evaluate the vascular anatomy. RADIATION DOSE REDUCTION: This exam was performed according to the departmental dose-optimization program which includes automated exposure control, adjustment of the mA and/or kV according to patient size and/or use of iterative reconstruction technique. CONTRAST:  13mL OMNIPAQUE IOHEXOL 350 MG/ML SOLN COMPARISON:  CT chest 11/23/2020, CT chest 03/09/2021, CT angiography chest 02/06/2014 FINDINGS: Cardiovascular: Satisfactory opacification of the pulmonary arteries to the segmental level. No evidence of pulmonary embolism. Main pulmonary artery is normal in caliber. Normal heart size. No significant pericardial effusion. The thoracic aorta is normal in caliber. No atherosclerotic plaque of the thoracic aorta. No coronary artery calcifications. Mediastinum/Nodes: There is a 1.5 cm right hilar lymph node (5:101). No enlarged  mediastinal, hilar, or axillary lymph nodes. Thyroid gland, trachea, and esophagus demonstrate no significant findings. Lungs/Pleura: Expiratory phase of respiration. No focal consolidation. No pulmonary nodule. No pulmonary mass. No pleural effusion. No pneumothorax. Upper Abdomen: Status post cholecystectomy.  No acute abnormality. Musculoskeletal: No chest wall abnormality. No suspicious lytic or blastic osseous lesions. No acute displaced fracture. Review of the MIP images confirms the above findings. IMPRESSION: 1. No pulmonary embolus. 2. No acute intrapulmonary abnormality. 3. Nonspecific right hilar lymphadenopathy. Electronically Signed   By: Iven Finn M.D.   On: 12/02/2021 20:01   CT Chest High Resolution  Result Date: 11/23/2021 CLINICAL DATA:  41 year old female with history of potential atypical infection noted on prior CT examination. Follow-up study. EXAM: CT CHEST WITHOUT CONTRAST TECHNIQUE: Multidetector CT imaging of the chest was performed following the standard protocol without intravenous contrast. High resolution imaging of the lungs, as well as inspiratory and expiratory imaging, was performed. RADIATION DOSE REDUCTION: This exam was performed according to the departmental dose-optimization program which includes automated exposure control, adjustment of the mA and/or kV according to patient size and/or use of iterative reconstruction technique. COMPARISON:  Chest CT 03/09/2021. FINDINGS: Cardiovascular: Heart size is normal. There is no significant pericardial fluid, thickening or pericardial calcification. No atherosclerotic calcifications are noted in the thoracic aorta or the coronary arteries. Mediastinum/Nodes: No pathologically enlarged mediastinal or hilar lymph nodes. Please note that accurate exclusion of hilar adenopathy is limited on noncontrast CT scans. Esophagus is unremarkable in appearance. No axillary lymphadenopathy. Lungs/Pleura: High-resolution images demonstrate  no significant regions of ground-glass attenuation, septal thickening, subpleural reticulation, parenchymal banding, traction bronchiectasis or honeycombing to suggest interstitial lung disease. Inspiratory and expiratory imaging demonstrates some mild air trapping indicative of mild small airways disease. No acute consolidative airspace disease. No pleural effusions. No suspicious appearing pulmonary nodules or masses are noted. Upper Abdomen: Status post cholecystectomy. Musculoskeletal: There are no aggressive appearing lytic or blastic lesions noted in the visualized portions of the skeleton. IMPRESSION: 1. No findings to suggest interstitial lung disease. 2. Mild air trapping indicative of mild small airways disease. Electronically Signed   By: Vinnie Langton M.D.   On: 11/23/2021 13:37      No flowsheet data  found.  No results found for: NITRICOXIDE   12/02/2021: EKG reviewed by me which showed sinus tachycardia with frequent PVC's, run of ventricular bigeminy, and prolonged QTc 512 msec. Artifact present   Assessment & Plan:   Moderate persistent asthma PFTs today with mild obstruction and reversibility. DLCO with mild diffusion defect (77%). There was air trapping noted on HRCT. Consistent with asthma diagnosis. Eosinophils also significantly elevated on recent CBC. Initiated therapy with ICS/LABA and singulair. Advised to also use claritin for allergies. Albuterol PRN refilled. Close follow up with repeat CBC with diff to evaluate eosinophils.   Patient Instructions  Start Advair 250 1 puff Twice daily. Brush tongue and rinse mouth afterwards  Start Claritin 10 mg daily for allergies  Start singulair 10 mg At bedtime  -Continue Albuterol inhaler 2 puffs every 6 hours as needed for shortness of breath or wheezing. Notify if symptoms persist despite rescue inhaler/neb use.  Asthma Action Plan in place Rinse mouth after inhaled corticosteroid use.  Avoid triggers, when able.   Exercise encouraged. Notify if worsening symptoms upon exertion.  Notify and seek help if symptoms unrelieved by rescue inhaler.  Advise further care in emergency department given rhythm changes and profuse sweating with symptoms of fatigue   Follow up in one month with Dr. Ander Slade or Alanson Aly. If symptoms do not improve or worsen, please contact office for sooner follow up or seek emergency care.    Altered mental status Initially stuporous; aroused with minimal intervention. Diaphoretic. Stabilized after a few minutes and was pleasant and interactive; A&Ox3. BS was elevated in 200's, pt reported that was a relatively normal result for her. EKG with ventricular bigeminy and frequent PVCs as well as prolonged QTc, which were new findings although there was artifact present. Advised she seek further evaluation at the ED. She was agreeable to go but declined EMS transport. Pt contacted a friend who agreed to take her straight to the ED from the office.   Bipolar I disorder, most recent episode depressed ([place]) On multiple sedating medications. Stated she did not take more than prescribed. She did take her gabapentin 100 mg and Abilify today and trazodone last night. She reported not taking her Adderall or methocarbamol    Clayton Bibles, NP 12/03/2021  Pt aware and understands NP's role.

## 2021-12-02 NOTE — ED Notes (Signed)
Pt was eating recess pieces and drinking an energy drink and a separate canned expresso when this RN entered the room for the first time. Explained to the Pt reasons to remain NPO until all labs and imaging completed.

## 2021-12-02 NOTE — Discharge Instructions (Signed)
There is no pulmonary embolism or blood clot seen on your CT scan.  You had a small inflamed lymph node on the right side of your chest in your lungs.  This is nonspecific

## 2021-12-02 NOTE — ED Notes (Signed)
Pt sleeping when I entered  but woke upon entry. Pt GCS 15 but seems to nod off anf on to sleep. Pt no longer complaihing about pain. Pt also just admitted that she is a type 2 diabetic.

## 2021-12-02 NOTE — ED Notes (Signed)
Pt running 88-93 with a good pleth. Rt requested for second assessment

## 2021-12-02 NOTE — ED Triage Notes (Addendum)
Seen at pulmonology and sent here due to irregular EKG ( [facility]) States SOB and lower back pain that is chronic  Denies any chest pain  Denies any other associated symptoms

## 2021-12-02 NOTE — Assessment & Plan Note (Signed)
On multiple sedating medications. Stated she did not take more than prescribed. She did take her gabapentin 100 mg and Abilify today and trazodone last night. She reported not taking her Adderall or methocarbamol

## 2021-12-02 NOTE — Patient Instructions (Addendum)
Start Advair 250 1 puff Twice daily. Brush tongue and rinse mouth afterwards  Start Claritin 10 mg daily for allergies  Start singulair 10 mg At bedtime  -Continue Albuterol inhaler 2 puffs every 6 hours as needed for shortness of breath or wheezing. Notify if symptoms persist despite rescue inhaler/neb use.  Asthma Action Plan in place Rinse mouth after inhaled corticosteroid use.  Avoid triggers, when able.  Exercise encouraged. Notify if worsening symptoms upon exertion.  Notify and seek help if symptoms unrelieved by rescue inhaler.  Advise further care in emergency department given rhythm changes and profuse sweating with symptoms of fatigue   Follow up in one month with Dr. Wynona Neat or Philis Nettle. If symptoms do not improve or worsen, please contact office for sooner follow up or seek emergency care.

## 2021-12-02 NOTE — ED Notes (Signed)
RT Note: Blood Glucose obtained per RN request/notified of results.

## 2021-12-07 LAB — PULMONARY FUNCTION TEST
DL/VA % pred: 90 %
DL/VA: 3.98 ml/min/mmHg/L
DLCO cor % pred: 77 %
DLCO cor: 17.56 ml/min/mmHg
DLCO unc % pred: 79 %
DLCO unc: 17.98 ml/min/mmHg
FEF 25-75 Post: 2.89 L/sec
FEF 25-75 Pre: 1.81 L/sec
FEF2575-%Change-Post: 60 %
FEF2575-%Pred-Post: 91 %
FEF2575-%Pred-Pre: 57 %
FEV1-%Change-Post: 12 %
FEV1-%Pred-Post: 81 %
FEV1-%Pred-Pre: 72 %
FEV1-Post: 2.52 L
FEV1-Pre: 2.24 L
FEV1FVC-%Change-Post: 7 %
FEV1FVC-%Pred-Pre: 93 %
FEV6-%Change-Post: 4 %
FEV6-%Pred-Post: 81 %
FEV6-%Pred-Pre: 78 %
FEV6-Post: 3.05 L
FEV6-Pre: 2.93 L
FEV6FVC-%Pred-Post: 101 %
FEV6FVC-%Pred-Pre: 101 %
FVC-%Change-Post: 4 %
FVC-%Pred-Post: 80 %
FVC-%Pred-Pre: 76 %
FVC-Post: 3.05 L
FVC-Pre: 2.93 L
Post FEV1/FVC ratio: 83 %
Post FEV6/FVC ratio: 100 %
Pre FEV1/FVC ratio: 77 %
Pre FEV6/FVC Ratio: 100 %
RV % pred: 111 %
RV: 1.84 L
TLC % pred: 92 %
TLC: 4.81 L

## 2021-12-18 IMAGING — DX DG CHEST 2V
2 series · 2 of 2 positions shown · non-contrast
Comparison: April 16, 2018

CLINICAL DATA: Cough

EXAM:
CHEST - 2 VIEW

[chest pa]
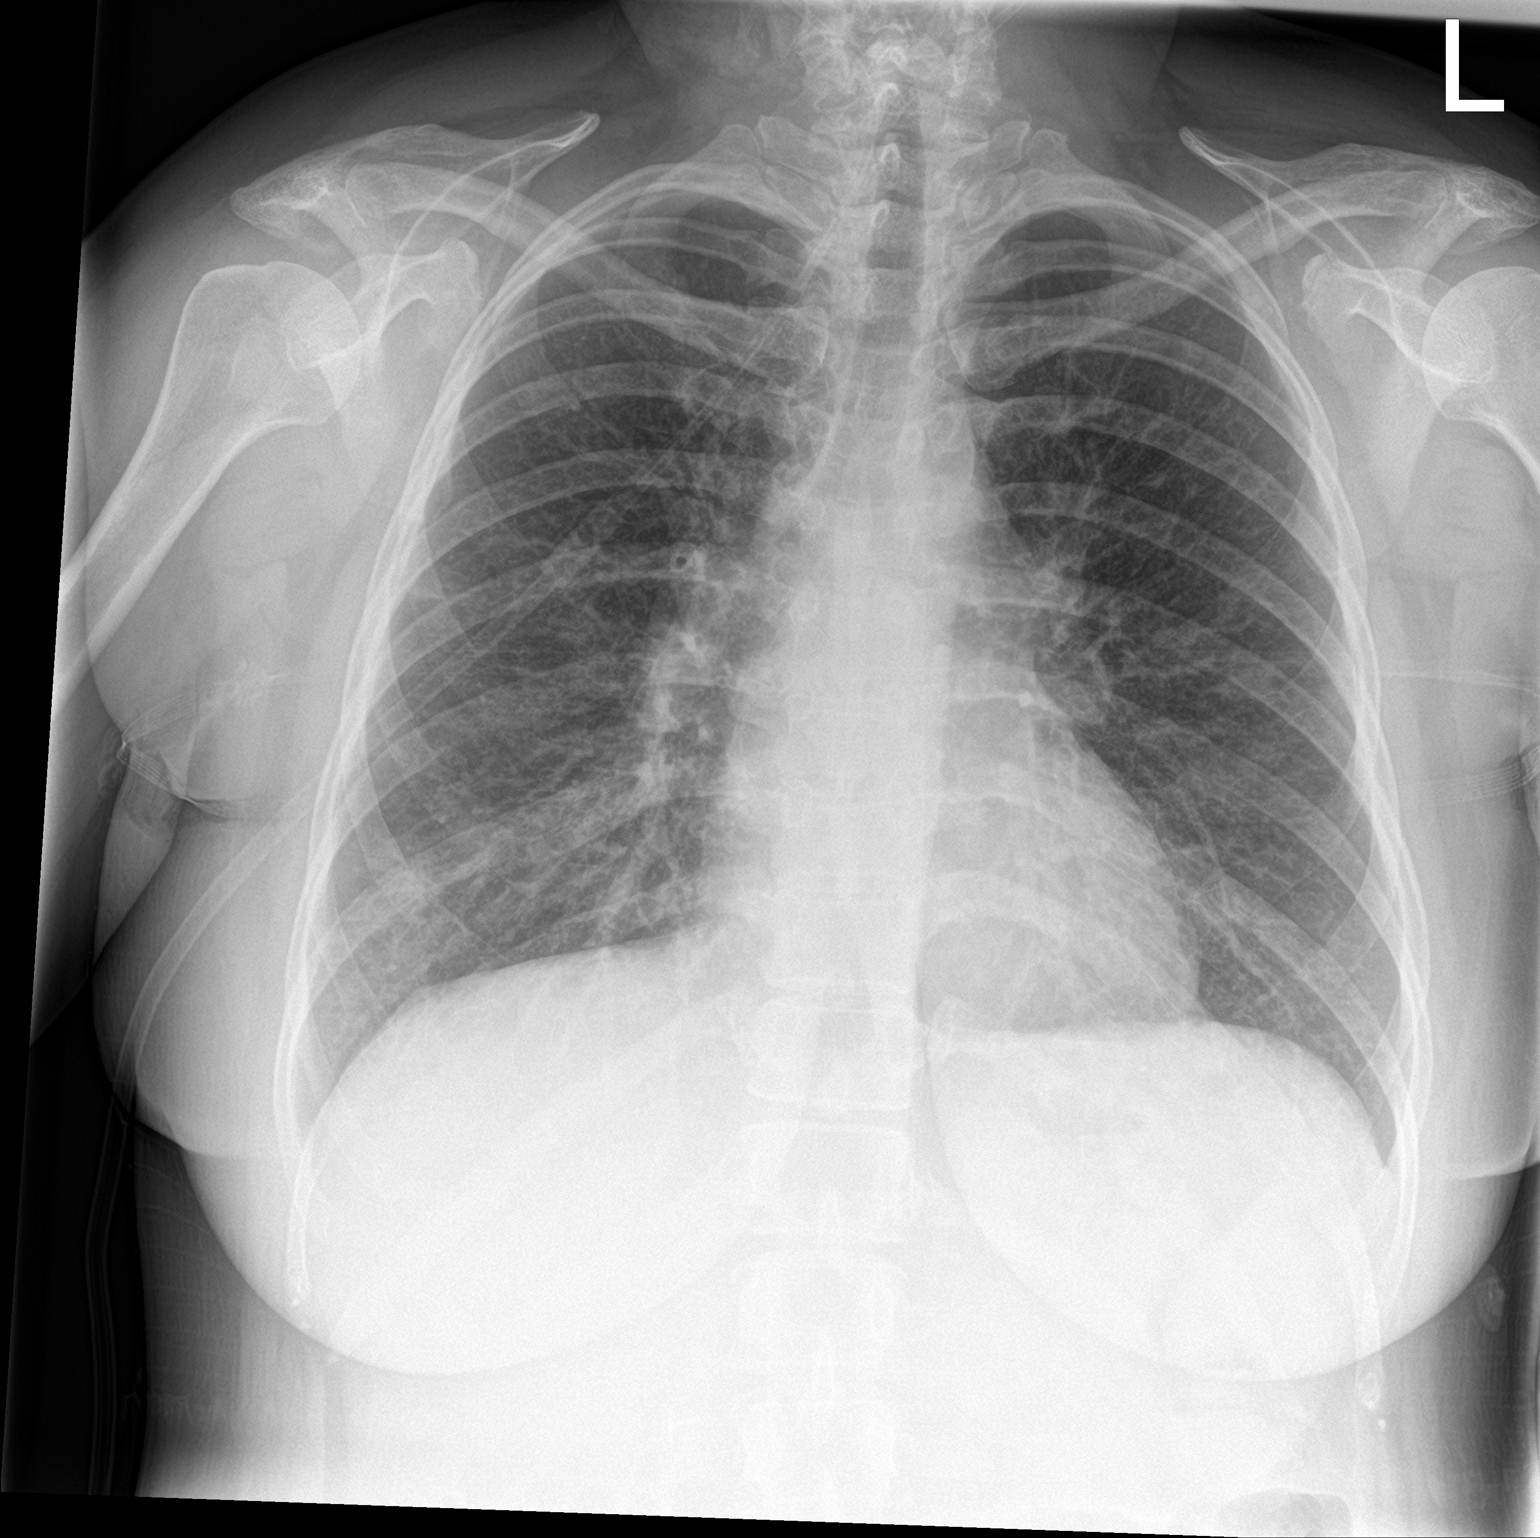

[chest lat]
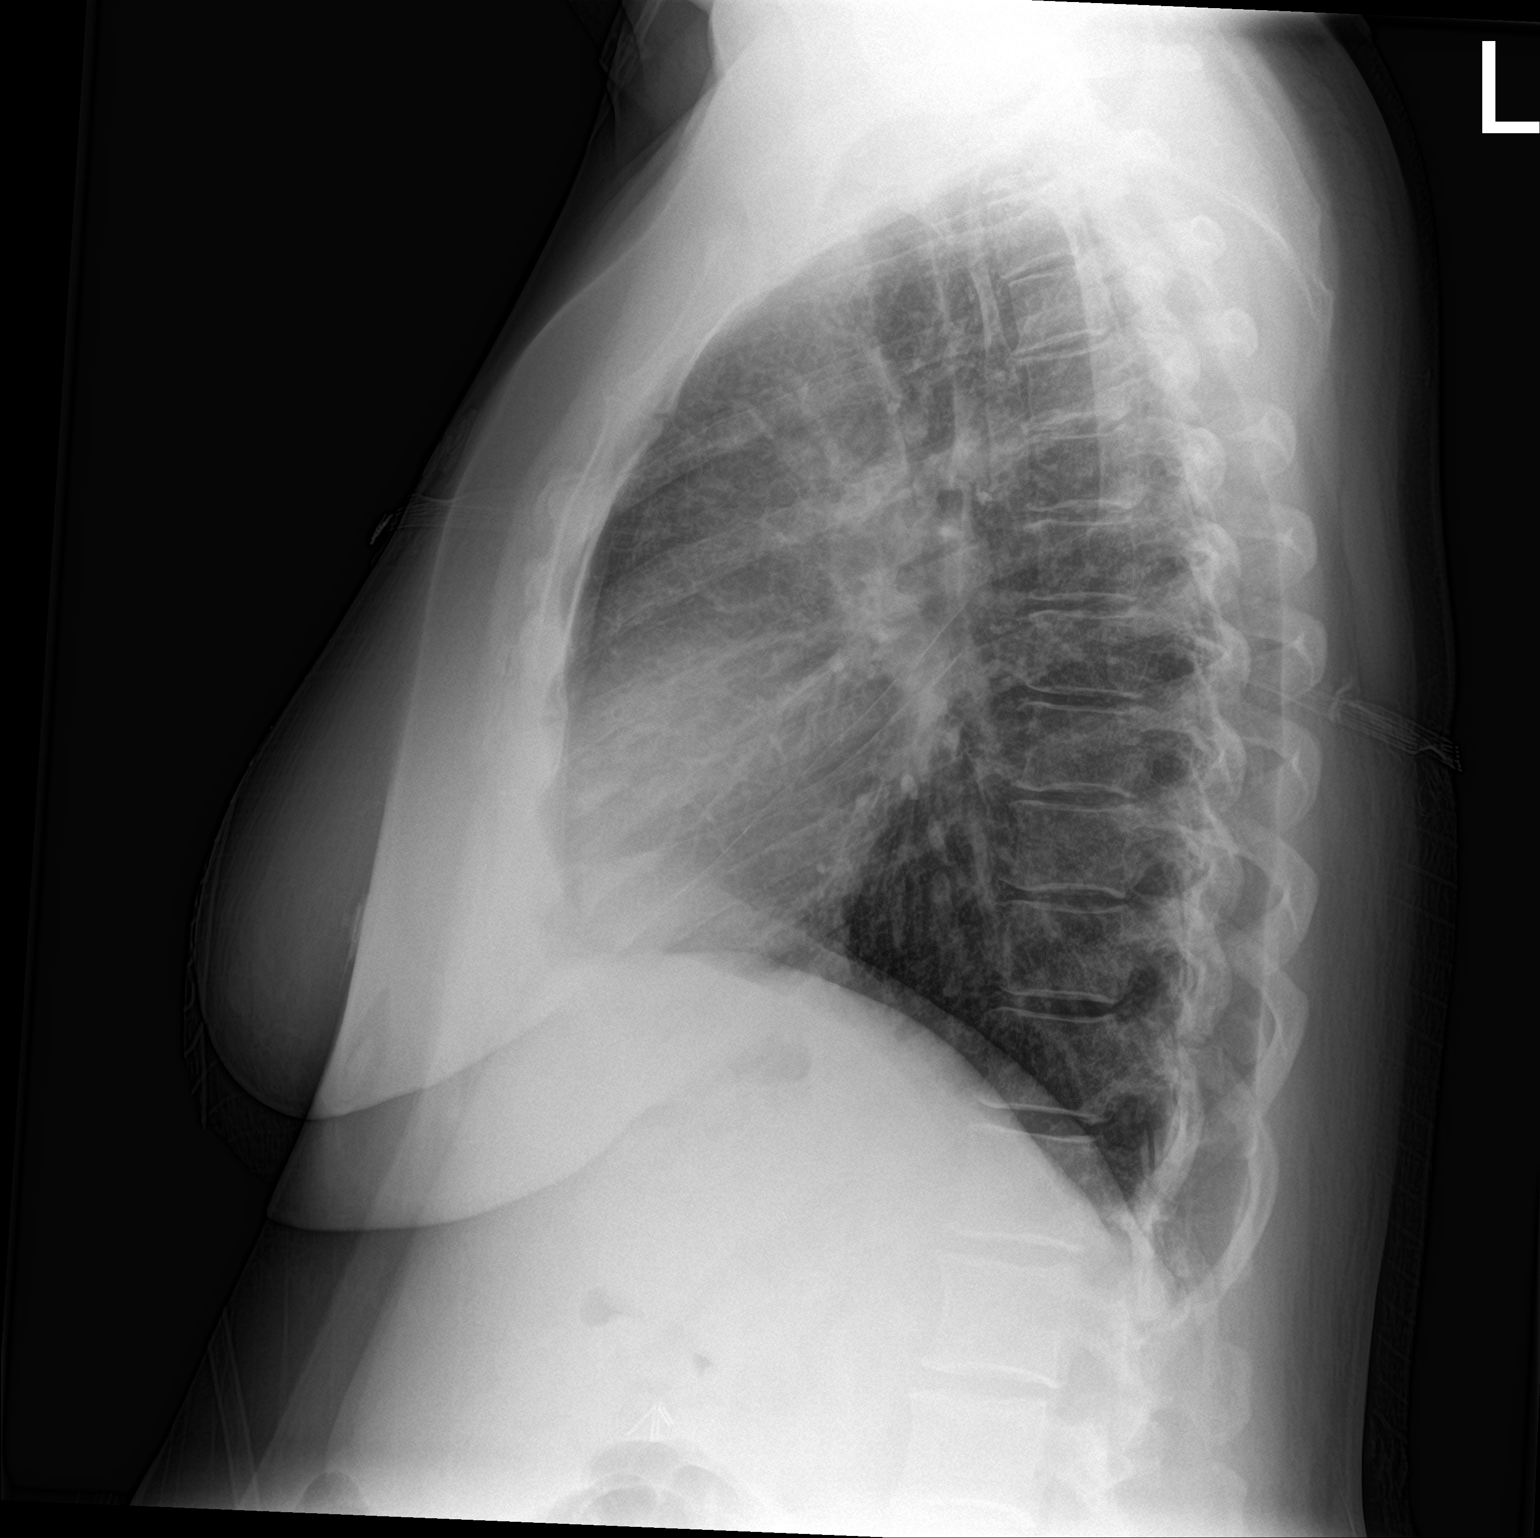

[2 of 2 positions shown; findings below may reference images not displayed]

FINDINGS: The cardiomediastinal silhouette is unchanged in contour. No pleural
effusion. No pneumothorax. Faint reticulonodular opacities at the
RIGHT lung base. Mild peribronchial cuffing, increased in comparison
to prior. Surgical clips project over the upper abdomen. No acute
osseous abnormality.
IMPRESSION: 1. Faint reticulonodular opacities at the RIGHT lung base.
Differential considerations include infection, aspiration or
atelectasis.

Followup PA and lateral chest X-ray is recommended in 3-4 weeks
following trial of antibiotic therapy to ensure resolution and
exclude underlying malignancy.

2. Mild bronchial wall thickening as can be seen in bronchitis or
small airways disease.

## 2022-01-27 ENCOUNTER — Ambulatory Visit: Payer: Managed Care, Other (non HMO) | Admitting: Specialist

## 2022-04-08 ENCOUNTER — Other Ambulatory Visit: Payer: Self-pay

## 2022-04-08 ENCOUNTER — Emergency Department (HOSPITAL_BASED_OUTPATIENT_CLINIC_OR_DEPARTMENT_OTHER)
Admission: EM | Admit: 2022-04-08 | Discharge: 2022-04-08 | Disposition: A | Discharge status: Home / Self Care | Payer: No Typology Code available for payment source | Attending: Emergency Medicine | Admitting: Emergency Medicine

## 2022-04-08 ENCOUNTER — Emergency Department (HOSPITAL_BASED_OUTPATIENT_CLINIC_OR_DEPARTMENT_OTHER): Payer: No Typology Code available for payment source

## 2022-04-08 ENCOUNTER — Encounter (HOSPITAL_BASED_OUTPATIENT_CLINIC_OR_DEPARTMENT_OTHER): Payer: Self-pay | Admitting: Emergency Medicine

## 2022-04-08 ENCOUNTER — Emergency Department (HOSPITAL_BASED_OUTPATIENT_CLINIC_OR_DEPARTMENT_OTHER): Payer: No Typology Code available for payment source | Admitting: Radiology

## 2022-04-08 DIAGNOSIS — E119 Type 2 diabetes mellitus without complications: Secondary | ICD-10-CM | POA: Insufficient documentation

## 2022-04-08 DIAGNOSIS — R0789 Other chest pain: Secondary | ICD-10-CM | POA: Diagnosis not present

## 2022-04-08 DIAGNOSIS — M545 Low back pain, unspecified: Secondary | ICD-10-CM | POA: Diagnosis present

## 2022-04-08 DIAGNOSIS — J45909 Unspecified asthma, uncomplicated: Secondary | ICD-10-CM | POA: Diagnosis not present

## 2022-04-08 DIAGNOSIS — M25561 Pain in right knee: Secondary | ICD-10-CM | POA: Insufficient documentation

## 2022-04-08 DIAGNOSIS — R519 Headache, unspecified: Secondary | ICD-10-CM | POA: Insufficient documentation

## 2022-04-08 DIAGNOSIS — Z79899 Other long term (current) drug therapy: Secondary | ICD-10-CM | POA: Insufficient documentation

## 2022-04-08 DIAGNOSIS — Y92410 Unspecified street and highway as the place of occurrence of the external cause: Secondary | ICD-10-CM | POA: Diagnosis not present

## 2022-04-08 DIAGNOSIS — M542 Cervicalgia: Secondary | ICD-10-CM | POA: Insufficient documentation

## 2022-04-08 MED ORDER — CYCLOBENZAPRINE HCL 10 MG PO TABS: 10.0000 mg | ORAL_TABLET | Freq: Two times a day (BID) | ORAL | 0 refills | Status: DC | PRN

## 2022-04-08 MED ORDER — KETOROLAC TROMETHAMINE 60 MG/2ML IM SOLN
60.0000 mg | Freq: Once | INTRAMUSCULAR | Status: AC
  Administered 2022-04-08: 60 mg via INTRAMUSCULAR
  Filled 2022-04-08: qty 2

## 2022-04-08 MED ORDER — CYCLOBENZAPRINE HCL 10 MG PO TABS
10.0000 mg | ORAL_TABLET | Freq: Once | ORAL | Status: AC
  Administered 2022-04-08: 10 mg via ORAL
  Filled 2022-04-08: qty 1
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
## 2022-04-08 NOTE — ED Notes (Signed)
Discharge instructions, follow up care, and prescription reviewed and explained, pt verbalized understanding. Pt had no further question on departure. Pt caox4 and ambulatory on d/c.
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
## 2022-04-26 ENCOUNTER — Other Ambulatory Visit: Payer: Self-pay

## 2022-04-26 ENCOUNTER — Encounter (HOSPITAL_BASED_OUTPATIENT_CLINIC_OR_DEPARTMENT_OTHER): Payer: Self-pay

## 2022-04-26 ENCOUNTER — Emergency Department (HOSPITAL_BASED_OUTPATIENT_CLINIC_OR_DEPARTMENT_OTHER)
Admission: EM | Admit: 2022-04-26 | Discharge: 2022-04-26 | Disposition: A | Discharge status: Home / Self Care | Payer: No Typology Code available for payment source | Attending: Emergency Medicine | Admitting: Emergency Medicine

## 2022-04-26 DIAGNOSIS — Z79899 Other long term (current) drug therapy: Secondary | ICD-10-CM | POA: Insufficient documentation

## 2022-04-26 DIAGNOSIS — M5441 Lumbago with sciatica, right side: Secondary | ICD-10-CM | POA: Insufficient documentation

## 2022-04-26 DIAGNOSIS — M549 Dorsalgia, unspecified: Secondary | ICD-10-CM | POA: Diagnosis present

## 2022-04-26 DIAGNOSIS — Z7984 Long term (current) use of oral hypoglycemic drugs: Secondary | ICD-10-CM | POA: Insufficient documentation

## 2022-04-26 DIAGNOSIS — E119 Type 2 diabetes mellitus without complications: Secondary | ICD-10-CM | POA: Insufficient documentation

## 2022-04-26 DIAGNOSIS — G8929 Other chronic pain: Secondary | ICD-10-CM | POA: Diagnosis not present

## 2022-04-26 MED ORDER — KETOROLAC TROMETHAMINE 30 MG/ML IJ SOLN
30.0000 mg | Freq: Once | INTRAMUSCULAR | Status: AC
  Administered 2022-04-26: 30 mg via INTRAMUSCULAR
  Filled 2022-04-26: qty 1

## 2022-04-26 MED ORDER — METHOCARBAMOL 500 MG PO TABS
1000.0000 mg | ORAL_TABLET | Freq: Once | ORAL | Status: AC
  Administered 2022-04-26: 1000 mg via ORAL
  Filled 2022-04-26: qty 2

## 2022-04-26 MED ORDER — ACETAMINOPHEN 500 MG PO TABS: 500.0000 mg | ORAL_TABLET | Freq: Four times a day (QID) | ORAL | 0 refills | Status: AC | PRN

## 2022-04-26 MED ORDER — IBUPROFEN 600 MG PO TABS: 600.0000 mg | ORAL_TABLET | Freq: Four times a day (QID) | ORAL | 0 refills | Status: DC | PRN

## 2022-04-26 MED ORDER — METHYLPREDNISOLONE SODIUM SUCC 125 MG IJ SOLR
125.0000 mg | Freq: Once | INTRAMUSCULAR | Status: AC
  Administered 2022-04-26: 125 mg via INTRAMUSCULAR
  Filled 2022-04-26: qty 2

## 2022-04-26 MED ORDER — METHOCARBAMOL 500 MG PO TABS: 500.0000 mg | ORAL_TABLET | Freq: Two times a day (BID) | ORAL | 0 refills | Status: AC
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
## 2022-04-26 NOTE — Discharge Instructions (Addendum)
I have attached a different neurosurgeon/spine specialist to these papers for you to call.  Use the medications that I have sent to the pharmacy as prescribed.  Tylenol 500 mg may be used up to 4 times a day.  You may alternate this with ibuprofen.  If you are concerned about ibuprofen, speak with your PCP.  The methocarbamol is the muscle relaxant that may make you tired.  Only take it at night if you have things to do throughout the day.  Return with any worsening symptoms, especially numbness in your pelvis, bowel or bladder incontinence, lower extremity weakness, fevers or chills.
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
## 2022-05-06 IMAGING — DX DG CHEST 1V PORT
1 series · 1 of 1 positions shown · non-contrast
Comparison: 07/13/2021

CLINICAL DATA: Cough

EXAM:
PORTABLE CHEST 1 VIEW

[chest]
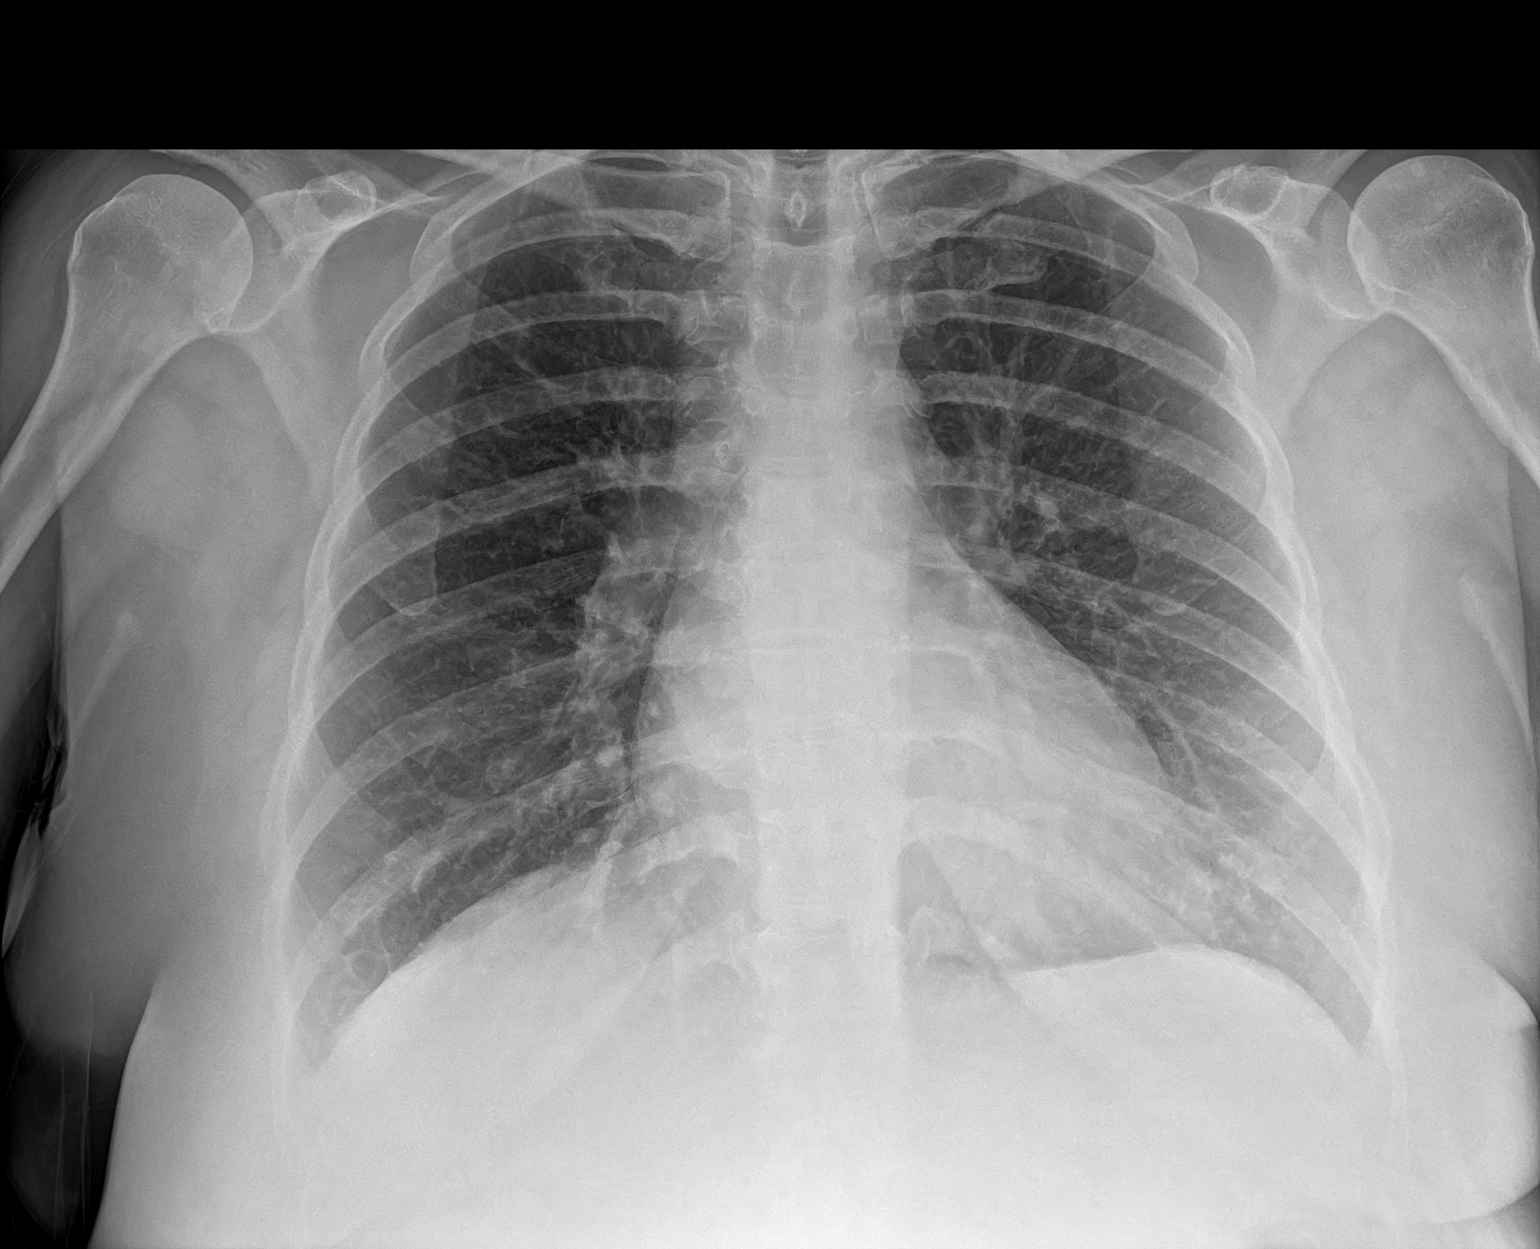

[1 of 1 positions shown; findings below may reference images not displayed]

FINDINGS: Minimal density at the left lung base is similar to prior study,
favor scarring although early infiltrate cannot be excluded. Right
lung clear. Heart is normal size. No effusions or acute bony
abnormality.
IMPRESSION: Stable minimal density at the left lung base, scarring versus or
early infiltrate.

## 2022-05-06 IMAGING — DX DG ANKLE COMPLETE 3+V*R*
1 series · 3 of 3 positions shown · non-contrast
Comparison: None.

CLINICAL DATA: Ankle pain.  Swelling.

EXAM:
RIGHT ANKLE - COMPLETE 3+ VIEW

[Series 1: ankle · 0.14mm/px · 3 of 3 slices shown]
[im 1/3]
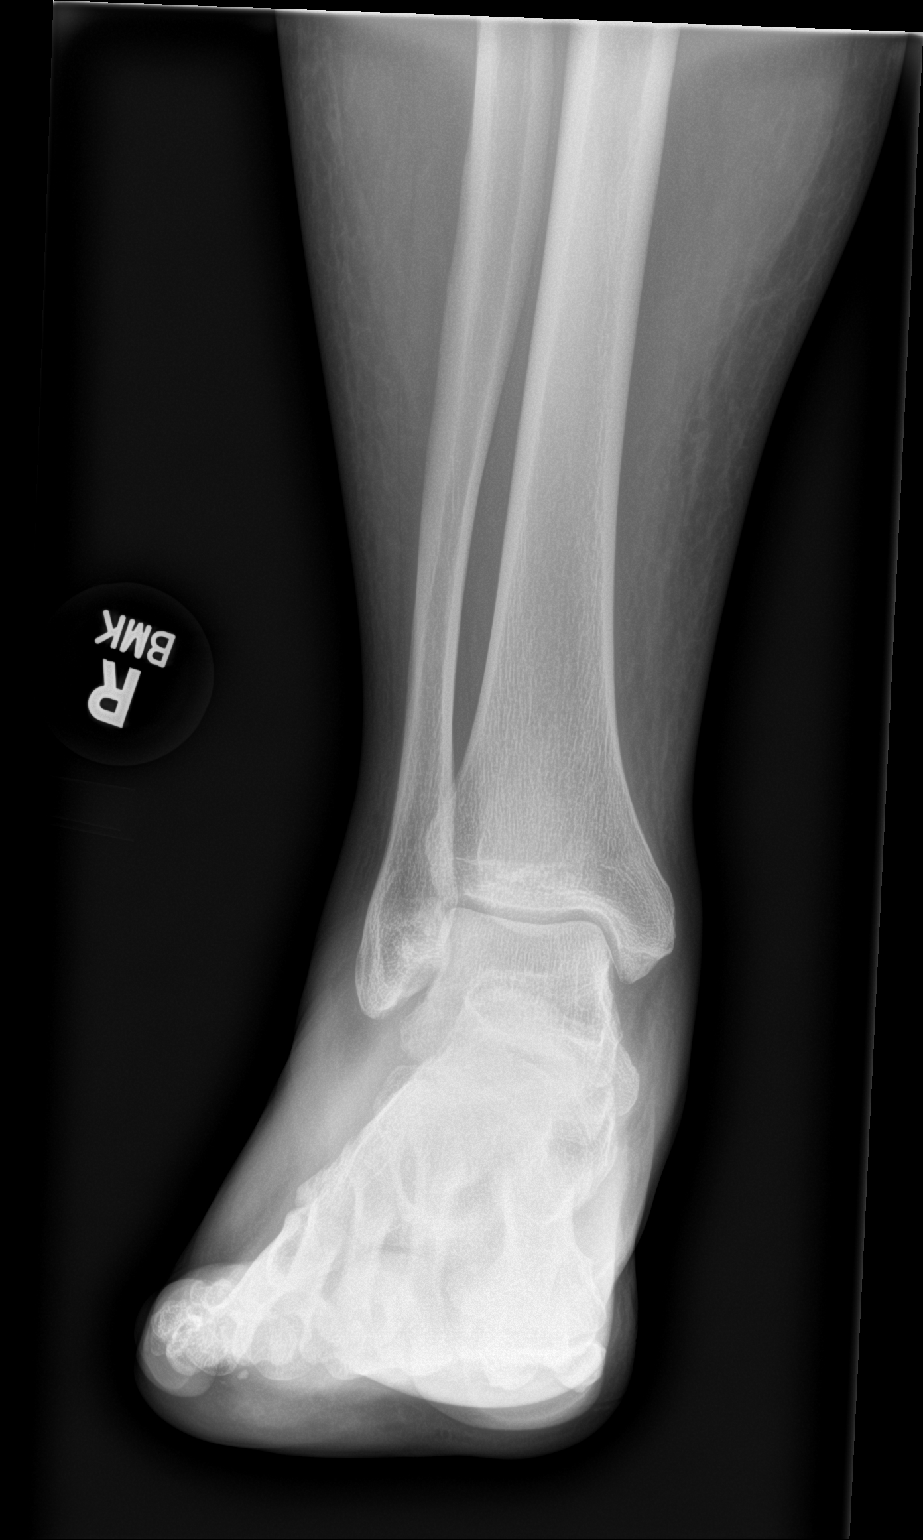
[im 2/3]
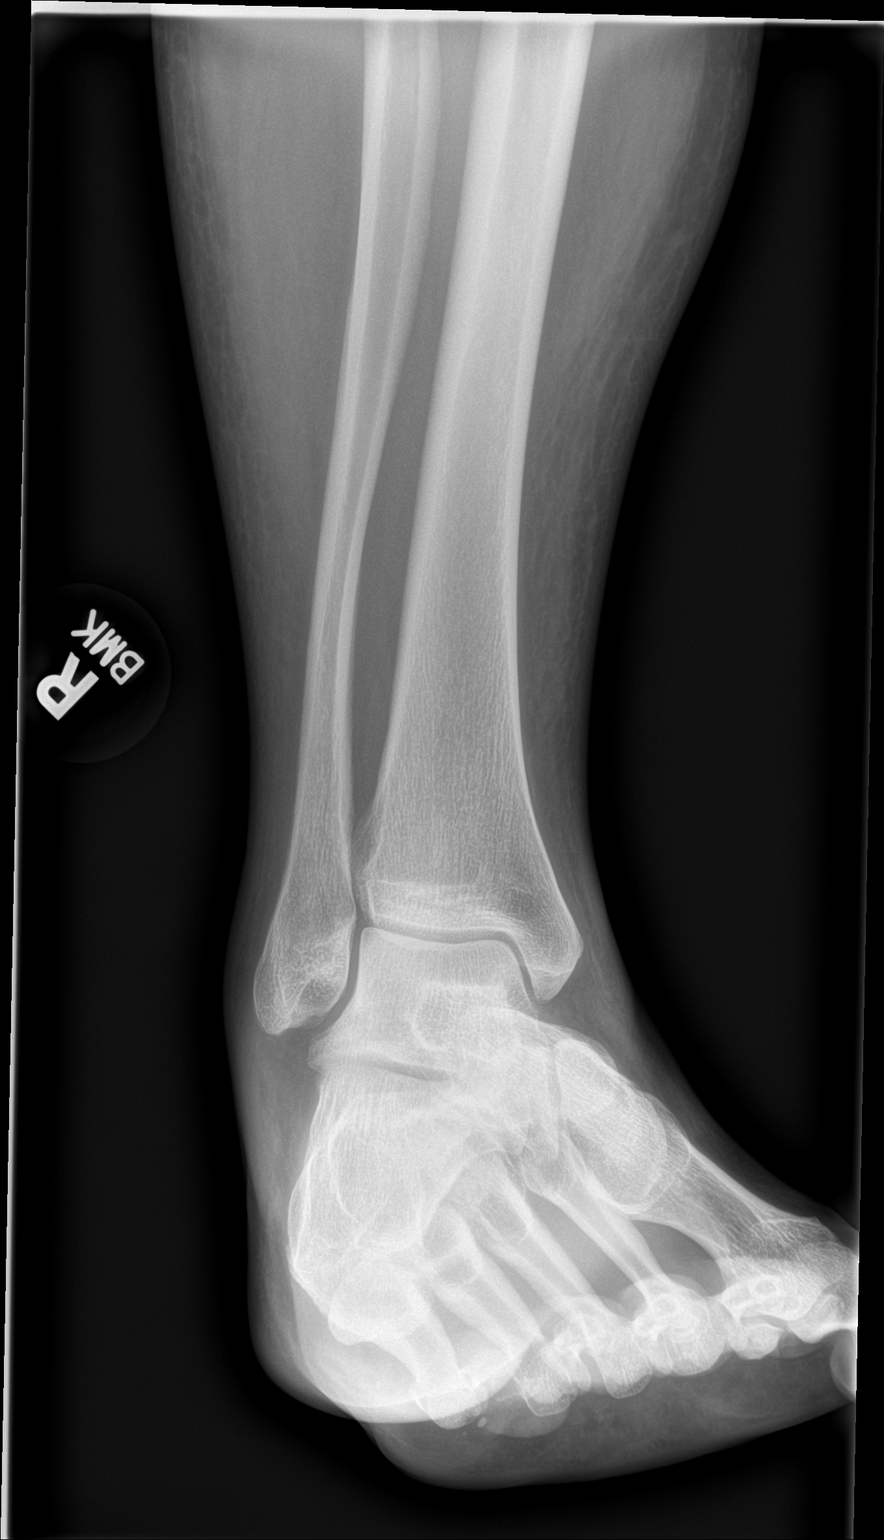
[im 3/3]
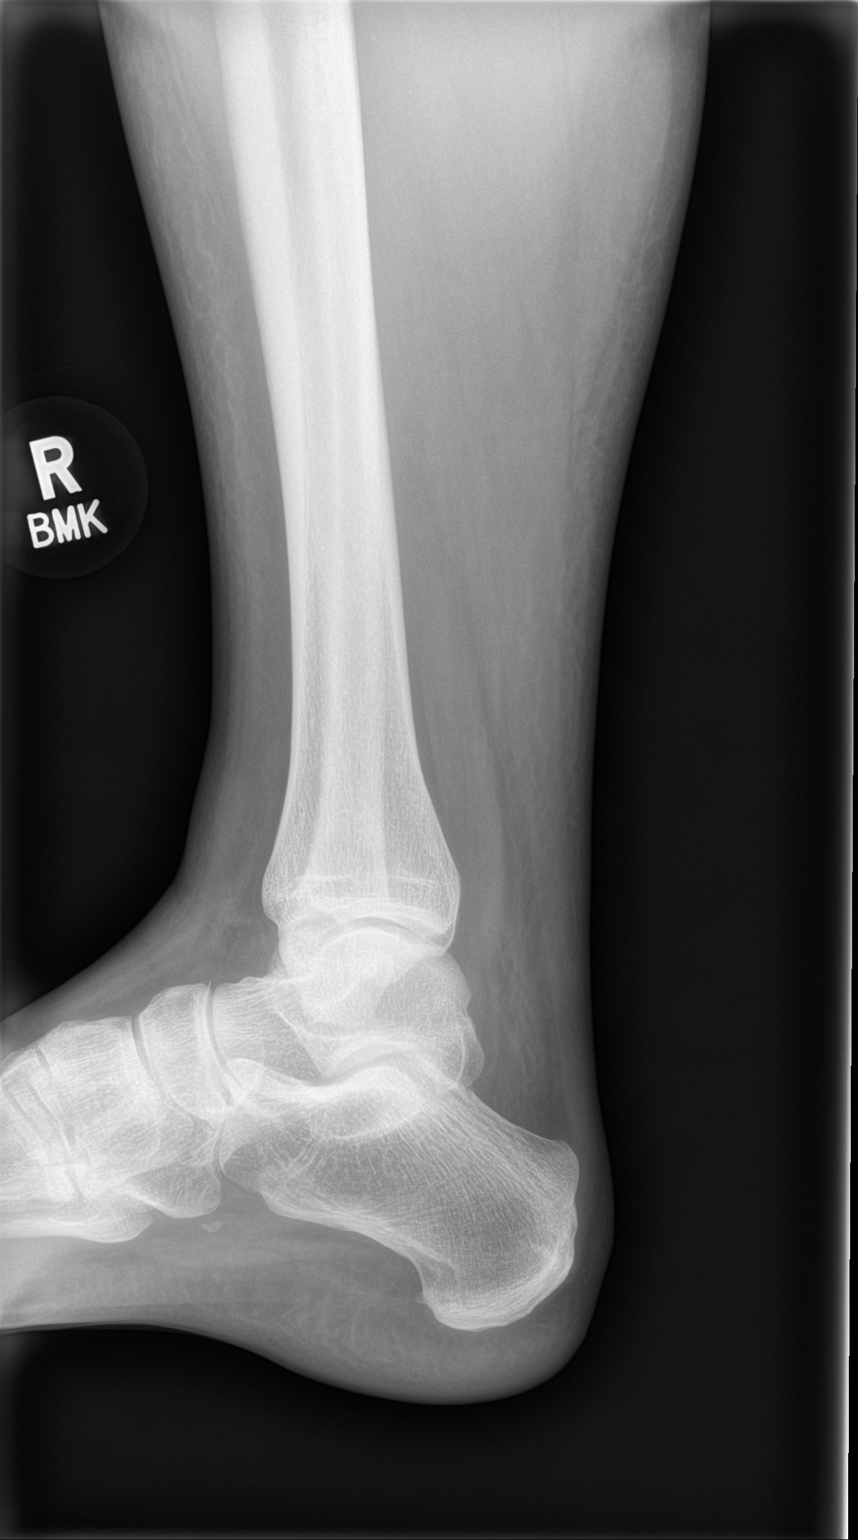

[3 of 3 positions shown; findings below may reference images not displayed]

FINDINGS: There is no evidence of fracture, dislocation, or joint effusion.
There is no evidence of arthropathy or other focal bone abnormality.
Soft tissues are unremarkable. No soft tissue gas.
IMPRESSION: Negative.

## 2022-05-20 ENCOUNTER — Other Ambulatory Visit: Payer: Self-pay | Admitting: Neurosurgery

## 2022-05-20 DIAGNOSIS — G8929 Other chronic pain: Secondary | ICD-10-CM

## 2022-06-02 ENCOUNTER — Inpatient Hospital Stay: Admission: RE | Admit: 2022-06-02 | Payer: Self-pay | Source: Ambulatory Visit

## 2022-07-04 NOTE — Progress Notes (Signed)
Appointment was canceled.

## 2022-07-17 ENCOUNTER — Ambulatory Visit
Admission: RE | Admit: 2022-07-17 | Discharge: 2022-07-17 | Disposition: A | Discharge status: Home / Self Care | Payer: BC Managed Care – PPO | Source: Ambulatory Visit | Attending: Neurosurgery | Admitting: Neurosurgery

## 2022-07-17 DIAGNOSIS — G8929 Other chronic pain: Secondary | ICD-10-CM

## 2022-07-28 ENCOUNTER — Encounter (HOSPITAL_BASED_OUTPATIENT_CLINIC_OR_DEPARTMENT_OTHER): Payer: Self-pay

## 2022-07-28 ENCOUNTER — Other Ambulatory Visit: Payer: Self-pay

## 2022-07-28 ENCOUNTER — Emergency Department (HOSPITAL_BASED_OUTPATIENT_CLINIC_OR_DEPARTMENT_OTHER)
Admission: EM | Admit: 2022-07-28 | Discharge: 2022-07-29 | Disposition: A | Discharge status: Home / Self Care | Payer: BC Managed Care – PPO | Attending: Emergency Medicine | Admitting: Emergency Medicine

## 2022-07-28 ENCOUNTER — Emergency Department (HOSPITAL_BASED_OUTPATIENT_CLINIC_OR_DEPARTMENT_OTHER): Payer: BC Managed Care – PPO | Admitting: Radiology

## 2022-07-28 DIAGNOSIS — R0602 Shortness of breath: Secondary | ICD-10-CM | POA: Diagnosis not present

## 2022-07-28 DIAGNOSIS — F17200 Nicotine dependence, unspecified, uncomplicated: Secondary | ICD-10-CM | POA: Diagnosis not present

## 2022-07-28 DIAGNOSIS — R0981 Nasal congestion: Secondary | ICD-10-CM | POA: Insufficient documentation

## 2022-07-28 DIAGNOSIS — E119 Type 2 diabetes mellitus without complications: Secondary | ICD-10-CM | POA: Diagnosis not present

## 2022-07-28 DIAGNOSIS — Z794 Long term (current) use of insulin: Secondary | ICD-10-CM | POA: Diagnosis not present

## 2022-07-28 DIAGNOSIS — R059 Cough, unspecified: Secondary | ICD-10-CM | POA: Diagnosis not present

## 2022-07-28 DIAGNOSIS — J988 Other specified respiratory disorders: Secondary | ICD-10-CM

## 2022-07-28 DIAGNOSIS — R062 Wheezing: Secondary | ICD-10-CM | POA: Diagnosis present

## 2022-07-28 DIAGNOSIS — Z7984 Long term (current) use of oral hypoglycemic drugs: Secondary | ICD-10-CM | POA: Insufficient documentation

## 2022-07-28 LAB — CBC WITH DIFFERENTIAL/PLATELET
Abs Immature Granulocytes: 0.07 10*3/uL (ref 0.00–0.07)
Basophils Absolute: 0.1 10*3/uL (ref 0.0–0.1)
Basophils Relative: 0 %
Eosinophils Absolute: 1.2 10*3/uL — ABNORMAL HIGH (ref 0.0–0.5)
Eosinophils Relative: 6 %
HCT: 47.5 % — ABNORMAL HIGH (ref 36.0–46.0)
Hemoglobin: 16.2 g/dL — ABNORMAL HIGH (ref 12.0–15.0)
Immature Granulocytes: 0 %
Lymphocytes Relative: 20 %
Lymphs Abs: 4.1 10*3/uL — ABNORMAL HIGH (ref 0.7–4.0)
MCH: 30.9 pg (ref 26.0–34.0)
MCHC: 34.1 g/dL (ref 30.0–36.0)
MCV: 90.6 fL (ref 80.0–100.0)
Monocytes Absolute: 1 10*3/uL (ref 0.1–1.0)
Monocytes Relative: 5 %
Neutro Abs: 14.3 10*3/uL — ABNORMAL HIGH (ref 1.7–7.7)
Neutrophils Relative %: 69 %
Platelets: 373 10*3/uL (ref 150–400)
RBC: 5.24 MIL/uL — ABNORMAL HIGH (ref 3.87–5.11)
RDW: 12.4 % (ref 11.5–15.5)
WBC: 20.7 10*3/uL — ABNORMAL HIGH (ref 4.0–10.5)
nRBC: 0 % (ref 0.0–0.2)

## 2022-07-28 LAB — COMPREHENSIVE METABOLIC PANEL
ALT: 12 U/L (ref 0–44)
AST: 10 U/L — ABNORMAL LOW (ref 15–41)
Albumin: 4.4 g/dL (ref 3.5–5.0)
Alkaline Phosphatase: 81 U/L (ref 38–126)
Anion gap: 9 (ref 5–15)
BUN: 7 mg/dL (ref 6–20)
CO2: 28 mmol/L (ref 22–32)
Calcium: 9.4 mg/dL (ref 8.9–10.3)
Chloride: 95 mmol/L — ABNORMAL LOW (ref 98–111)
Creatinine, Ser: 0.64 mg/dL (ref 0.44–1.00)
GFR, Estimated: >60 mL/min (ref >60)
Glucose, Bld: 455 mg/dL — ABNORMAL HIGH (ref 70–99)
Potassium: 4.1 mmol/L (ref 3.5–5.1)
Sodium: 132 mmol/L — ABNORMAL LOW (ref 135–145)
Total Bilirubin: 0.2 mg/dL — ABNORMAL LOW (ref 0.3–1.2)
Total Protein: 7.1 g/dL (ref 6.5–8.1)

## 2022-07-28 LAB — CBG MONITORING, ED: Glucose-Capillary: 394 mg/dL — ABNORMAL HIGH (ref 70–99)

## 2022-07-28 LAB — TROPONIN I (HIGH SENSITIVITY): Troponin I (High Sensitivity): <2 ng/L (ref <18)

## 2022-07-28 NOTE — ED Triage Notes (Signed)
Shortness of breath starting this morning with cold symptoms for the lat week.

## 2022-07-29 MED ORDER — IPRATROPIUM-ALBUTEROL 0.5-2.5 (3) MG/3ML IN SOLN
3.0000 mL | Freq: Once | RESPIRATORY_TRACT | Status: AC
  Administered 2022-07-29: 3 mL via RESPIRATORY_TRACT
  Filled 2022-07-29: qty 3

## 2022-07-29 MED ORDER — ALBUTEROL SULFATE HFA 108 (90 BASE) MCG/ACT IN AERS
2.0000 | INHALATION_SPRAY | Freq: Once | RESPIRATORY_TRACT | Status: AC
  Administered 2022-07-29: 2 via RESPIRATORY_TRACT
  Filled 2022-07-29: qty 6.7

## 2022-07-29 MED ORDER — DOXYCYCLINE HYCLATE 100 MG PO CAPS: 100.0000 mg | ORAL_CAPSULE | Freq: Two times a day (BID) | ORAL | 0 refills | Status: DC

## 2022-07-29 NOTE — ED Provider Notes (Signed)
[facility] Provider Note   CSN: [id number] Arrival date & time: 07/28/22  2053     History  Chief Complaint  Patient presents with   Shortness of Breath    [name] is a 41 y.o. female.  41 yo F with a chief complaints of cough and congestion.  Going on for about a week now.  Felt like it was mostly upper respiratory and now feels like it is descended into her lungs as of this morning.  She is a smoker.  Her son had a similar illness.   Shortness of Breath      Home Medications Prior to Admission medications   Medication Sig Start Date End Date Taking? Authorizing Provider  doxycycline (VIBRAMYCIN) 100 MG capsule Take 1 capsule (100 mg total) by mouth 2 (two) times daily. One po bid x 7 days 07/29/22  Yes Deno Etienne, DO  acetaminophen (TYLENOL) 500 MG tablet Take 1 tablet (500 mg total) by mouth every 6 (six) hours as needed. 04/26/22   Redwine, Madison A, PA-C  albuterol (VENTOLIN HFA) 108 (90 Base) MCG/ACT inhaler Inhale 1-2 puffs into the lungs every 6 (six) hours as needed for wheezing or shortness of breath. 12/02/21   Cobb, Karie Schwalbe, NP  amphetamine-dextroamphetamine (ADDERALL) 30 MG tablet Take 30 mg by mouth 2 (two) times daily with breakfast and lunch. 09/28/20   [provider]  ARIPiprazole (ABILIFY) 10 MG tablet Take 10 mg by mouth every morning. 01/21/21   [provider]  BD PEN NEEDLE NANO 2ND GEN 32G X 4 MM MISC daily. 09/23/20   [provider]  Cholecalciferol (VITAMIN D3 PO) Take 1 tablet by mouth every morning.    [provider]  cyclobenzaprine (FLEXERIL) 10 MG tablet Take 1 tablet (10 mg total) by mouth 2 (two) times daily as needed for muscle spasms. 04/08/22   Gareth Morgan, MD  Empagliflozin-metFORMIN HCl (SYNJARDY) 12.02-999 MG TABS Take 1 tablet by mouth 2 (two) times daily. 02/12/21   [provider]  ezetimibe (ZETIA) 10 MG tablet Take 1 tablet (10 mg total)  by mouth daily. 09/26/17   Scot Jun, FNP  fluticasone-salmeterol (ADVAIR) 250-50 MCG/ACT AEPB Inhale 1 puff into the lungs every 12 (twelve) hours. 12/02/21   Cobb, Karie Schwalbe, NP  gabapentin (NEURONTIN) 100 MG capsule Take 1 capsule (100 mg total) by mouth daily after breakfast. Patient taking differently: Take 100 mg by mouth every morning. 02/14/20   Jessy Oto, MD  gabapentin (NEURONTIN) 300 MG capsule Take 1-2 capsules (300-600 mg total) by mouth at bedtime. Only take as prescribed.  Husband is to administer medication Patient taking differently: Take 600 mg by mouth at bedtime. Take only as prescribed. Husband is to administer medication 02/14/20   Jessy Oto, MD  ibuprofen (ADVIL) 600 MG tablet Take 1 tablet (600 mg total) by mouth every 6 (six) hours as needed. 04/26/22   Redwine, Madison A, PA-C  lisinopril (PRINIVIL,ZESTRIL) 2.5 MG tablet Take 1 tablet (2.5 mg total) by mouth daily. 05/10/17   Scot Jun, FNP  loratadine (CLARITIN) 10 MG tablet Take 1 tablet (10 mg total) by mouth daily. 12/02/21   Cobb, Karie Schwalbe, NP  montelukast (SINGULAIR) 10 MG tablet Take 1 tablet (10 mg total) by mouth at bedtime. 12/02/21   Cobb, Karie Schwalbe, NP  Multiple Vitamin (MULTIVITAMIN WITH MINERALS) TABS tablet Take 1 tablet by mouth every morning.    [provider]  Omega-3 Fatty  Acids (FISH OIL PO) Take 1 capsule by mouth every morning.    [provider]  pravastatin (PRAVACHOL) 80 MG tablet Take 1 tablet (80 mg total) by mouth daily. Patient taking differently: Take 80 mg by mouth every morning. 05/11/17   Scot Jun, FNP  Semaglutide, 1 MG/DOSE, (OZEMPIC, 1 MG/DOSE,) 4 MG/3ML SOPN Inject 1 mg into the skin every Wednesday. 02/12/21   [provider]  sertraline (ZOLOFT) 100 MG tablet Take 2 tablets (200 mg total) by mouth daily. For depression and anxiety. 06/13/13   Ruben Im, PA-C  traZODone (DESYREL) 100 MG tablet Take 200 mg by mouth at  bedtime. 01/21/21   [provider]      Allergies    Orange oil, Penicillins, Carisoprodol, and Piroxicam    Review of Systems   Review of Systems  Respiratory:  Positive for shortness of breath.     Physical Exam Updated Vital Signs BP 116/80 (BP Location: Right Arm)   Pulse 96   Temp 98 F (36.7 C) (Oral)   Resp 18   Ht 5\' 6"  (1.676 m)   Wt 95.3 kg   LMP 06/23/2022 (Approximate)   SpO2 97%   BMI 33.89 kg/m  Physical Exam Vitals and nursing note reviewed.  Constitutional:      General: She is not in acute distress.    Appearance: She is well-developed. She is not diaphoretic.  HENT:     Head: Normocephalic and atraumatic.  Eyes:     Pupils: Pupils are equal, round, and reactive to light.  Cardiovascular:     Rate and Rhythm: Normal rate and regular rhythm.     Heart sounds: No murmur heard.    No friction rub. No gallop.  Pulmonary:     Effort: Pulmonary effort is normal.     Breath sounds: Wheezing present. No rales.     Comments: Coarse breath sounds in all fields with prolonged expiratory effort and some end expiratory wheezing. Abdominal:     General: There is no distension.     Palpations: Abdomen is soft.     Tenderness: There is no abdominal tenderness.  Musculoskeletal:        General: No tenderness.     Cervical back: Normal range of motion and neck supple.  Skin:    General: Skin is warm and dry.  Neurological:     Mental Status: She is alert and oriented to person, place, and time.  Psychiatric:        Behavior: Behavior normal.     ED Results / Procedures / Treatments   Labs (all labs ordered are listed, but only abnormal results are displayed) Labs Reviewed  CBC WITH DIFFERENTIAL/PLATELET - Abnormal; Notable for the following components:      Result Value   WBC 20.7 (*)    RBC 5.24 (*)    Hemoglobin 16.2 (*)    HCT 47.5 (*)    Neutro Abs 14.3 (*)    Lymphs Abs 4.1 (*)    Eosinophils Absolute 1.2 (*)    All other components  within normal limits  COMPREHENSIVE METABOLIC PANEL - Abnormal; Notable for the following components:   Sodium 132 (*)    Chloride 95 (*)    Glucose, Bld 455 (*)    AST 10 (*)    Total Bilirubin 0.2 (*)    All other components within normal limits  CBG MONITORING, ED - Abnormal; Notable for the following components:   Glucose-Capillary 394 (*)  All other components within normal limits  TROPONIN I (HIGH SENSITIVITY)    EKG EKG Interpretation  Date/Time:  Wednesday July 28 2022 21:04:29 EDT Ventricular Rate:  104 PR Interval:  148 QRS Duration: 64 QT Interval:  316 QTC Calculation: 415 R Axis:   86 Text Interpretation: Sinus tachycardia Right atrial enlargement Borderline ECG No significant change since last tracing Confirmed by Deno Etienne [phone]) on 07/28/2022 11:48:52 PM  Radiology DG Chest 2 View  Result Date: 07/28/2022 CLINICAL DATA:  Shortness of breath. EXAM: CHEST - 2 VIEW COMPARISON:  Chest x-ray April 08, 2022. FINDINGS: The heart size and mediastinal contours are within normal limits. Both lungs are clear. No visible pleural effusions or pneumothorax. No acute osseous abnormality. IMPRESSION: No active cardiopulmonary disease. Electronically Signed   By: Margaretha Sheffield M.D.   On: 07/28/2022 21:36    Procedures Procedures    Medications Ordered in ED Medications  ipratropium-albuterol (DUONEB) 0.5-2.5 (3) MG/3ML nebulizer solution 3 mL (3 mLs Nebulization Given 07/29/22 0019)  albuterol (VENTOLIN HFA) 108 (90 Base) MCG/ACT inhaler 2 puff (2 puffs Inhalation Given 07/29/22 0029)    ED Course/ Medical Decision Making/ A&P                           Medical Decision Making Amount and/or Complexity of Data Reviewed Labs: ordered. Radiology: ordered.  Risk Prescription drug management.   41 yo F with a chief complaints of difficulty breathing.  Has been suffering from what sounds like an upper respiratory illness for about a week.  History of smoking.   Has an inhaler at home but is not sure where it is.  Oxygen saturation a bit low on my initial exam.  We will give a DuoNeb.  Albuterol inhaler.  I am hesitant to start her on steroids as she has had multiple visits to the emergency department for hyperglycemia in the past on my record review.  Blood sugar here is in the 400s as well.  Patient with a history of pneumonia that required hospitalization with a quite elevated white blood cell counts and poorly controlled diabetes will start on antibiotics.  2:32 AM:  I have discussed the diagnosis/risks/treatment options with the patient.  Evaluation and diagnostic testing in the emergency department does not suggest an emergent condition requiring admission or immediate intervention beyond what has been performed at this time.  They will follow up with PCP. We also discussed returning to the ED immediately if new or worsening sx occur. We discussed the sx which are most concerning (e.g., sudden worsening pain, fever, inability to tolerate by mouth) that necessitate immediate return. Medications administered to the patient during their visit and any new prescriptions provided to the patient are listed below.  Medications given during this visit Medications  ipratropium-albuterol (DUONEB) 0.5-2.5 (3) MG/3ML nebulizer solution 3 mL (3 mLs Nebulization Given 07/29/22 0019)  albuterol (VENTOLIN HFA) 108 (90 Base) MCG/ACT inhaler 2 puff (2 puffs Inhalation Given 07/29/22 0029)     The patient appears reasonably screen and/or stabilized for discharge and I doubt any other medical condition or other [facility] requiring further screening, evaluation, or treatment in the ED at this time prior to discharge.          Final Clinical Impression(s) / ED Diagnoses Final diagnoses:  Wheezing-associated respiratory infection (WARI)    Rx / DC Orders ED Discharge Orders          Ordered    doxycycline (  VIBRAMYCIN) 100 MG capsule  2 times daily        07/29/22  Riverton, Logan, DO 07/29/22 2637

## 2022-07-29 NOTE — Discharge Instructions (Signed)
Use your inhaler every 4 hours(6 puffs) while awake, return for sudden worsening shortness of breath, or if you need to use your inhaler more often.  ° °

## 2022-08-03 ENCOUNTER — Other Ambulatory Visit: Payer: Self-pay

## 2022-08-03 DIAGNOSIS — J40 Bronchitis, not specified as acute or chronic: Secondary | ICD-10-CM | POA: Insufficient documentation

## 2022-08-03 DIAGNOSIS — E119 Type 2 diabetes mellitus without complications: Secondary | ICD-10-CM | POA: Diagnosis not present

## 2022-08-03 DIAGNOSIS — F17210 Nicotine dependence, cigarettes, uncomplicated: Secondary | ICD-10-CM | POA: Insufficient documentation

## 2022-08-03 DIAGNOSIS — R0602 Shortness of breath: Secondary | ICD-10-CM | POA: Diagnosis present

## 2022-08-03 DIAGNOSIS — Z20822 Contact with and (suspected) exposure to covid-19: Secondary | ICD-10-CM | POA: Insufficient documentation

## 2022-08-03 NOTE — ED Triage Notes (Signed)
Pt arrives POV from home with shortness of breath, non-productive cough and congestion for over a week.  Was seen in ED last week and prescribed Doxycycline and an inhaler, she says neither have helped her symptoms.  She says had a fever of 99.2 earlier today.

## 2022-08-04 ENCOUNTER — Emergency Department (HOSPITAL_BASED_OUTPATIENT_CLINIC_OR_DEPARTMENT_OTHER): Payer: BC Managed Care – PPO | Admitting: Radiology

## 2022-08-04 ENCOUNTER — Encounter (HOSPITAL_BASED_OUTPATIENT_CLINIC_OR_DEPARTMENT_OTHER): Payer: Self-pay

## 2022-08-04 ENCOUNTER — Emergency Department (HOSPITAL_BASED_OUTPATIENT_CLINIC_OR_DEPARTMENT_OTHER)
Admission: EM | Admit: 2022-08-04 | Discharge: 2022-08-04 | Disposition: A | Discharge status: Home / Self Care | Payer: BC Managed Care – PPO | Attending: Emergency Medicine | Admitting: Emergency Medicine

## 2022-08-04 DIAGNOSIS — J40 Bronchitis, not specified as acute or chronic: Secondary | ICD-10-CM

## 2022-08-04 DIAGNOSIS — R062 Wheezing: Secondary | ICD-10-CM

## 2022-08-04 LAB — CBC
HCT: 43.5 % (ref 36.0–46.0)
Hemoglobin: 15.1 g/dL — ABNORMAL HIGH (ref 12.0–15.0)
MCH: 31.6 pg (ref 26.0–34.0)
MCHC: 34.7 g/dL (ref 30.0–36.0)
MCV: 91 fL (ref 80.0–100.0)
Platelets: 367 10*3/uL (ref 150–400)
RBC: 4.78 MIL/uL (ref 3.87–5.11)
RDW: 12.6 % (ref 11.5–15.5)
WBC: 15.6 10*3/uL — ABNORMAL HIGH (ref 4.0–10.5)
nRBC: 0 % (ref 0.0–0.2)

## 2022-08-04 LAB — BASIC METABOLIC PANEL
Anion gap: 9 (ref 5–15)
BUN: 9 mg/dL (ref 6–20)
CO2: 27 mmol/L (ref 22–32)
Calcium: 9.1 mg/dL (ref 8.9–10.3)
Chloride: 97 mmol/L — ABNORMAL LOW (ref 98–111)
Creatinine, Ser: 0.72 mg/dL (ref 0.44–1.00)
GFR, Estimated: >60 mL/min (ref >60)
Glucose, Bld: 300 mg/dL — ABNORMAL HIGH (ref 70–99)
Potassium: 4.4 mmol/L (ref 3.5–5.1)
Sodium: 133 mmol/L — ABNORMAL LOW (ref 135–145)

## 2022-08-04 LAB — TROPONIN I (HIGH SENSITIVITY): Troponin I (High Sensitivity): <2 ng/L (ref <18)

## 2022-08-04 LAB — SARS CORONAVIRUS 2 BY RT PCR: SARS Coronavirus 2 by RT PCR: NEGATIVE

## 2022-08-04 MED ORDER — PREDNISONE 50 MG PO TABS
60.0000 mg | ORAL_TABLET | Freq: Once | ORAL | Status: AC
  Administered 2022-08-04: 60 mg via ORAL
  Filled 2022-08-04: qty 1

## 2022-08-04 MED ORDER — BENZONATATE 100 MG PO CAPS: 100.0000 mg | ORAL_CAPSULE | Freq: Three times a day (TID) | ORAL | 0 refills | Status: DC

## 2022-08-04 MED ORDER — PREDNISONE 20 MG PO TABS: 40.0000 mg | ORAL_TABLET | Freq: Every day | ORAL | 0 refills | Status: AC

## 2022-08-04 MED ORDER — IPRATROPIUM-ALBUTEROL 0.5-2.5 (3) MG/3ML IN SOLN
3.0000 mL | Freq: Once | RESPIRATORY_TRACT | Status: AC
  Administered 2022-08-04: 3 mL via RESPIRATORY_TRACT
  Filled 2022-08-04: qty 3

## 2022-08-04 MED ORDER — HYDROCODONE-ACETAMINOPHEN 5-325 MG PO TABS
1.0000 | ORAL_TABLET | Freq: Once | ORAL | Status: AC
  Administered 2022-08-04: 1 via ORAL
  Filled 2022-08-04: qty 1

## 2022-08-04 NOTE — ED Provider Notes (Signed)
[facility] Emergency Department Provider Note MRN:  [id number]  Arrival date & time: 08/04/22     Chief Complaint   Shortness of Breath   History of Present Illness   [name] is a 41 y.o. year-old female with a history of asthma, diabetes, tobacco use presenting to the ED with chief complaint of shortness of breath.  1-1/2 weeks of persistent cough.  Occasionally feels short of breath with wheezing.  No chest pain, no leg pain or swelling, no abdominal pain.  Was here in the emergency department recently and just finished a course of doxycycline, which did not help.  Has been taking the albuterol inhaler at home, which helps for a few hours.  Review of Systems  A thorough review of systems was obtained and all systems are negative except as noted in the HPI and PMH.   Patient's Health History    Past Medical History:  Diagnosis Date   Anxiety    Asthma    Back pain    CAP (community acquired pneumonia)    Chronic pain syndrome    Depression    Diabetes mellitus without complication (HCC)    Gallbladder problem    Obsessive compulsive disorder     Past Surgical History:  Procedure Laterality Date   CESAREAN SECTION     CHOLECYSTECTOMY  2009   wisdom teeth extraction      Family History  Problem Relation Age of Onset   Diabetes insipidus Father    Arthritis Father    Heart failure Father    COPD Father    Emphysema Father    Asthma Son     Social History   Socioeconomic History   Marital status: Married    Spouse name: Not on file   Number of children: Not on file   Years of education: Not on file   Highest education level: Not on file  Occupational History   Not on file  Tobacco Use   Smoking status: Every Day    Packs/day: 0.50    Years: 23.00    Total pack years: 11.50    Types: Cigarettes   Smokeless tobacco: Never  Vaping Use   Vaping Use: Some days   Substances: Nicotine, Flavoring  Substance and Sexual  Activity   Alcohol use: Not Currently    Comment: unsure amount (1/6) - states generally does not drink   Drug use: No   Sexual activity: Yes    Birth control/protection: None  Other Topics Concern   Not on file  Social History Narrative   Not on file   Social Determinants of Health   Financial Resource Strain: Not on file  Food Insecurity: Not on file  Transportation Needs: Not on file  Physical Activity: Not on file  Stress: Not on file  Social Connections: Not on file  Intimate Partner Violence: Not on file     Physical Exam   Vitals:   08/04/22 0118 08/04/22 0142  BP: 118/82   Pulse: (!) 112   Resp: 18   Temp:    SpO2: 95% 96%    CONSTITUTIONAL: Well-appearing, NAD NEURO/PSYCH:  Alert and oriented x 3, no focal deficits EYES:  eyes equal and reactive ENT/NECK:  no LAD, no JVD CARDIO: Tachycardic rate, well-perfused, normal S1 and S2 PULM: Diffuse wheezing and occasional rhonchi at the bases GI/GU:  non-distended, non-tender MSK/SPINE:  No gross deformities, no edema SKIN:  no rash, atraumatic   *Additional and/or pertinent findings  included in MDM below  Diagnostic and Interventional Summary    EKG Interpretation  Date/Time:  08/04/2022 at 12:01:40 Ventricular Rate:   111 PR Interval:   142 QRS Duration:  66 QT Interval: 324   QTC Calculation:440   R Axis:     Text Interpretation: Sinus tachycardia, no concerning ischemic features       Labs Reviewed  CBC - Abnormal; Notable for the following components:      Result Value   WBC 15.6 (*)    Hemoglobin 15.1 (*)    All other components within normal limits  BASIC METABOLIC PANEL - Abnormal; Notable for the following components:   Sodium 133 (*)    Chloride 97 (*)    Glucose, Bld 300 (*)    All other components within normal limits  SARS CORONAVIRUS 2 BY RT PCR  TROPONIN I (HIGH SENSITIVITY)  TROPONIN I (HIGH SENSITIVITY)    DG Chest 2 View  Final Result      Medications   ipratropium-albuterol (DUONEB) 0.5-2.5 (3) MG/3ML nebulizer solution 3 mL (3 mLs Nebulization Given 08/04/22 0142)  predniSONE (DELTASONE) tablet 60 mg (60 mg Oral Given 08/04/22 0140)  HYDROcodone-acetaminophen (NORCO/VICODIN) 5-325 MG per tablet 1 tablet (1 tablet Oral Given 08/04/22 0140)     Procedures  /  Critical Care Procedures  ED Course and Medical Decision Making  Initial Impression and Ddx Continued cough, wheezing, shortness of breath.  History and exam seems consistent with URI causing either asthma exacerbation or reactive airway disease with wheezing.  She is in no acute distress, no tachypnea, no increased work of breathing, there is diffuse wheezing.  Providing DuoNeb.  Suspect patient would benefit from a steroid burst.  Labs redrawn showing improvement in leukocytosis.  Given the lack of improvement on doxycycline, suspect a viral cause.  Highly doubt PE or any other emergent process.  Past medical/surgical history that increases complexity of ED encounter: Tobacco use  Interpretation of Diagnostics I personally reviewed the Chest Xray and my interpretation is as follows: No lobar opacity or pneumothorax    Patient Reassessment and Ultimate Disposition/Management     Discharge  Patient management required discussion with the following services or consulting groups:  None  Complexity of Problems Addressed Acute illness or injury that poses threat of life of bodily function  Additional Data Reviewed and Analyzed Further history obtained from: Prior labs/imaging results  Additional Factors Impacting ED Encounter Risk Prescriptions  Barth Kirks. Sedonia Small, [place] [email or web address]  Final Clinical Impressions(s) / ED Diagnoses     ICD-10-CM   1. Bronchitis  J40     2. Wheezing  R06.2       ED Discharge Orders          Ordered    predniSONE (DELTASONE) 20 MG tablet  Daily        08/04/22 0200     benzonatate (TESSALON) 100 MG capsule  Every 8 hours        08/04/22 0200             Discharge Instructions Discussed with and Provided to Patient:    Discharge Instructions      You were evaluated in the Emergency Department and after careful evaluation, we did not find any emergent condition requiring admission or further testing in the hospital.  Your exam/testing today was overall reassuring.  Symptoms seem to be due to a viral bronchitis.  Take the prednisone steroid medicine to help  with lung inflammation.  Take as directed.  Can use the Tessalon medication to help with cough.  Continue the inhaler at home every 4 hours as needed.  Please return to the Emergency Department if you experience any worsening of your condition.  Thank you for allowing [place] to be a part of your care.       Sabas Sous, MD 08/04/22 [phone]

## 2022-08-04 NOTE — ED Notes (Signed)
SpO2 maintaining 84-87% on Room Air while resting. Patient encouraged to take deep breaths. No improvement in oxygenation. Patient placed on [facility] at this time.

## 2022-08-04 NOTE — Discharge Instructions (Addendum)
You were evaluated in the Emergency Department and after careful evaluation, we did not find any emergent condition requiring admission or further testing in the hospital.  Your exam/testing today was overall reassuring.  Symptoms seem to be due to a viral bronchitis.  Take the prednisone steroid medicine to help with lung inflammation.  Take as directed.  Can use the Tessalon medication to help with cough.  Continue the inhaler at home every 4 hours as needed.  Please return to the Emergency Department if you experience any worsening of your condition.  Thank you for allowing [place] to be a part of your care.

## 2022-08-04 NOTE — ED Notes (Signed)
Pt O2 sat was between 95%- 97% while ambulating.

## 2022-08-11 ENCOUNTER — Encounter (HOSPITAL_BASED_OUTPATIENT_CLINIC_OR_DEPARTMENT_OTHER): Payer: Self-pay | Admitting: Emergency Medicine

## 2022-08-11 ENCOUNTER — Emergency Department (HOSPITAL_BASED_OUTPATIENT_CLINIC_OR_DEPARTMENT_OTHER)
Admission: EM | Admit: 2022-08-11 | Discharge: 2022-08-12 | Disposition: A | Discharge status: Home / Self Care | Payer: BC Managed Care – PPO | Attending: Emergency Medicine | Admitting: Emergency Medicine

## 2022-08-11 ENCOUNTER — Emergency Department (HOSPITAL_BASED_OUTPATIENT_CLINIC_OR_DEPARTMENT_OTHER): Payer: BC Managed Care – PPO | Admitting: Radiology

## 2022-08-11 ENCOUNTER — Other Ambulatory Visit: Payer: Self-pay

## 2022-08-11 DIAGNOSIS — Z794 Long term (current) use of insulin: Secondary | ICD-10-CM | POA: Insufficient documentation

## 2022-08-11 DIAGNOSIS — Z7951 Long term (current) use of inhaled steroids: Secondary | ICD-10-CM | POA: Diagnosis not present

## 2022-08-11 DIAGNOSIS — J4541 Moderate persistent asthma with (acute) exacerbation: Secondary | ICD-10-CM | POA: Insufficient documentation

## 2022-08-11 DIAGNOSIS — E1165 Type 2 diabetes mellitus with hyperglycemia: Secondary | ICD-10-CM | POA: Insufficient documentation

## 2022-08-11 DIAGNOSIS — J40 Bronchitis, not specified as acute or chronic: Secondary | ICD-10-CM | POA: Diagnosis not present

## 2022-08-11 DIAGNOSIS — Z7984 Long term (current) use of oral hypoglycemic drugs: Secondary | ICD-10-CM | POA: Insufficient documentation

## 2022-08-11 DIAGNOSIS — R0602 Shortness of breath: Secondary | ICD-10-CM | POA: Diagnosis present

## 2022-08-11 MED ORDER — IPRATROPIUM-ALBUTEROL 0.5-2.5 (3) MG/3ML IN SOLN
3.0000 mL | Freq: Once | RESPIRATORY_TRACT | Status: AC
  Administered 2022-08-11: 3 mL via RESPIRATORY_TRACT
  Filled 2022-08-11: qty 3

## 2022-08-11 MED ORDER — ALBUTEROL SULFATE HFA 108 (90 BASE) MCG/ACT IN AERS: 2.0000 | INHALATION_SPRAY | RESPIRATORY_TRACT | Status: DC | PRN

## 2022-08-11 NOTE — ED Provider Notes (Signed)
[facility] EMERGENCY DEPT  Provider Note  CSN: [id number] Arrival date & time: 08/11/22 2255  History Chief Complaint  Patient presents with  . Shortness of Breath    [name] is a 41 y.o. female with history of DM (does not adhere to diabetes diet) and asthma (continues to smoke) has had cough and wheezing for the last several weeks. Has had two prior ED visits for same, given course of doxycycline on 10/18 and a course of steroids on 10/25 and reports surgars have been 'sky high' although she is also drinking a large cup of coffee with cream and sugar. She reports she 'started to get a fever' but took some Dayquil and it went away. Symptoms were improved while on oral steroids.    Home Medications Prior to Admission medications   Medication Sig Start Date End Date Taking? Authorizing Provider  acetaminophen (TYLENOL) 500 MG tablet Take 1 tablet (500 mg total) by mouth every 6 (six) hours as needed. 04/26/22   Redwine, Madison A, PA-C  albuterol (VENTOLIN HFA) 108 (90 Base) MCG/ACT inhaler Inhale 1-2 puffs into the lungs every 6 (six) hours as needed for wheezing or shortness of breath. 12/02/21   Cobb, Ruby Cola, NP  amphetamine-dextroamphetamine (ADDERALL) 30 MG tablet Take 30 mg by mouth 2 (two) times daily with breakfast and lunch. 09/28/20   [provider]  ARIPiprazole (ABILIFY) 10 MG tablet Take 10 mg by mouth every morning. 01/21/21   [provider]  BD PEN NEEDLE NANO 2ND GEN 32G X 4 MM MISC daily. 09/23/20   [provider]  benzonatate (TESSALON) 100 MG capsule Take 1 capsule (100 mg total) by mouth every 8 (eight) hours. 08/04/22   Sabas Sous, MD  Cholecalciferol (VITAMIN D3 PO) Take 1 tablet by mouth every morning.    [provider]  cyclobenzaprine (FLEXERIL) 10 MG tablet Take 1 tablet (10 mg total) by mouth 2 (two) times daily as needed for muscle spasms. 04/08/22   Alvira Monday, MD  doxycycline  (VIBRAMYCIN) 100 MG capsule Take 1 capsule (100 mg total) by mouth 2 (two) times daily. One po bid x 7 days 07/29/22   Melene Plan, DO  Empagliflozin-metFORMIN HCl (SYNJARDY) 12.02-999 MG TABS Take 1 tablet by mouth 2 (two) times daily. 02/12/21   [provider]  ezetimibe (ZETIA) 10 MG tablet Take 1 tablet (10 mg total) by mouth daily. 09/26/17   Bing Neighbors, FNP  fluticasone-salmeterol (ADVAIR) 250-50 MCG/ACT AEPB Inhale 1 puff into the lungs every 12 (twelve) hours. 12/02/21   Cobb, Ruby Cola, NP  gabapentin (NEURONTIN) 100 MG capsule Take 1 capsule (100 mg total) by mouth daily after breakfast. Patient taking differently: Take 100 mg by mouth every morning. 02/14/20   Kerrin Champagne, MD  gabapentin (NEURONTIN) 300 MG capsule Take 1-2 capsules (300-600 mg total) by mouth at bedtime. Only take as prescribed.  Husband is to administer medication Patient taking differently: Take 600 mg by mouth at bedtime. Take only as prescribed. Husband is to administer medication 02/14/20   Kerrin Champagne, MD  ibuprofen (ADVIL) 600 MG tablet Take 1 tablet (600 mg total) by mouth every 6 (six) hours as needed. 04/26/22   Redwine, Madison A, PA-C  lisinopril (PRINIVIL,ZESTRIL) 2.5 MG tablet Take 1 tablet (2.5 mg total) by mouth daily. 05/10/17   Bing Neighbors, FNP  loratadine (CLARITIN) 10 MG tablet Take 1 tablet (10 mg total) by mouth daily. 12/02/21   Cobb,  Ruby Cola, NP  montelukast (SINGULAIR) 10 MG tablet Take 1 tablet (10 mg total) by mouth at bedtime. 12/02/21   Cobb, Ruby Cola, NP  Multiple Vitamin (MULTIVITAMIN WITH MINERALS) TABS tablet Take 1 tablet by mouth every morning.    [provider]  Omega-3 Fatty Acids (FISH OIL PO) Take 1 capsule by mouth every morning.    [provider]  pravastatin (PRAVACHOL) 80 MG tablet Take 1 tablet (80 mg total) by mouth daily. Patient taking differently: Take 80 mg by mouth every morning. 05/11/17   Bing Neighbors, FNP  Semaglutide,  1 MG/DOSE, (OZEMPIC, 1 MG/DOSE,) 4 MG/3ML SOPN Inject 1 mg into the skin every Wednesday. 02/12/21   [provider]  sertraline (ZOLOFT) 100 MG tablet Take 2 tablets (200 mg total) by mouth daily. For depression and anxiety. 06/13/13   Tamala Julian, PA-C  traZODone (DESYREL) 100 MG tablet Take 200 mg by mouth at bedtime. 01/21/21   [provider]     Allergies    Orange oil, Penicillins, Carisoprodol, and Piroxicam   Review of Systems   Review of Systems Please see HPI for pertinent positives and negatives  Physical Exam BP 129/88   Pulse 96   Temp 98.2 F (36.8 C) (Oral)   Resp 15   LMP 07/31/2022 (Exact Date)   SpO2 95%   Physical Exam Vitals and nursing note reviewed.  Constitutional:      Appearance: Normal appearance.  HENT:     Head: Normocephalic and atraumatic.     Nose: Nose normal.     Mouth/Throat:     Mouth: Mucous membranes are moist.  Eyes:     Extraocular Movements: Extraocular movements intact.     Conjunctiva/sclera: Conjunctivae normal.  Cardiovascular:     Rate and Rhythm: Normal rate.  Pulmonary:     Effort: Pulmonary effort is normal.     Breath sounds: Wheezing present.  Abdominal:     General: Abdomen is flat.     Palpations: Abdomen is soft.     Tenderness: There is no abdominal tenderness.  Musculoskeletal:        General: No swelling. Normal range of motion.     Cervical back: Neck supple.  Skin:    General: Skin is warm and dry.  Neurological:     General: No focal deficit present.     Mental Status: She is alert.  Psychiatric:        Mood and Affect: Mood normal.     ED Results / Procedures / Treatments   EKG None  Procedures Procedures  Medications Ordered in the ED Medications  albuterol (VENTOLIN HFA) 108 (90 Base) MCG/ACT inhaler 2 puff (has no administration in time range)  ipratropium-albuterol (DUONEB) 0.5-2.5 (3) MG/3ML nebulizer solution 3 mL (3 mLs Nebulization Given 08/11/22 2352)  insulin  aspart (novoLOG) injection 10 Units (10 Units Subcutaneous Given 08/12/22 0119)  sodium chloride 0.9 % bolus 1,000 mL (0 mLs Intravenous Stopped 08/12/22 0227)  HYDROcodone-acetaminophen (NORCO/VICODIN) 5-325 MG per tablet 1 tablet (1 tablet Oral Given 08/12/22 0114)  ipratropium-albuterol (DUONEB) 0.5-2.5 (3) MG/3ML nebulizer solution 3 mL (3 mLs Nebulization Given 08/12/22 0218)  ketorolac (TORADOL) 30 MG/ML injection 30 mg (30 mg Intravenous Given 08/12/22 0228)    Initial Impression and Plan  Patient with continued wheezing from recent bronchitis. Vitals are reassuring and SpO2 on room air is adequate. Will give nebs, check labs and CXR.   ED Course   Clinical Course as of 08/12/22  2297  Thu Aug 12, 2022  0013 CBC with leukocytosis, likely from recent steroid use.  [CS]  0013 I personally viewed the images from radiology studies and agree with radiologist interpretation:  CXR is clear.  [CS]  9892 BMP with hyperglycemia, but no signs of DKA. Will give a dose of insulin, IVF and reassess.  [CS]  0101 Patient requesting pain meds for back pain.  [CS]  1194 CBG improving. Patient reports some increase in wheezing. Given additional neb. I am reluctant to give her steroids given her hyperglycemia and poor compliance.  [CS]  1740 Patient's breathing improved. Sitting up playing on her phone. No signs of respiratory distress or hypoxia. Advised to continue with nebs at home. Encouraged to follow diabetic diet, stop smoking and follow up with her PCP for long term management.  [CS]  [phone] Per RN, patient expressed concern about going home. I had asked to check an ambulatory SpO2 to see if there was any reason she might need admission but the patient had left the department before that could be done.  [CS]    Clinical Course User Index [CS] Truddie Hidden, MD     MDM Rules/Calculators/A&P Medical Decision Making Given presenting complaint, I considered that admission might be necessary. After  review of results from ED lab and/or imaging studies, admission to the hospital is not indicated at this time.    Problems Addressed: Bronchitis: acute illness or injury Moderate persistent asthma with exacerbation: chronic illness or injury with exacerbation, progression, or side effects of treatment  Amount and/or Complexity of Data Reviewed Labs: ordered. Decision-making details documented in ED Course. Radiology: ordered and independent interpretation performed. Decision-making details documented in ED Course.  Risk Prescription drug management. Decision regarding hospitalization.    Final Clinical Impression(s) / ED Diagnoses Final diagnoses:  Moderate persistent asthma with exacerbation  Bronchitis    Rx / DC Orders ED Discharge Orders     None        Truddie Hidden, MD 08/12/22 [phone]

## 2022-08-11 NOTE — ED Triage Notes (Signed)
Feeling sob, achy "Ive been sick for about 1.26monttS  and its not going away"  Tearful in triage.

## 2022-08-11 NOTE — ED Notes (Signed)
ED Provider at bedside. 

## 2022-08-11 NOTE — ED Notes (Addendum)
Patient transported to X-ray via w/c at this time

## 2022-08-11 NOTE — ED Notes (Signed)
Pt now returned from Radiology via w/c - no acute changes noted

## 2022-08-11 NOTE — ED Notes (Signed)
While at bedside preparing for EKG pt began guarding R lower anterior rib cage and became tearful -- pt reports sudden onset spasm which is new for her -- spasm lasted for approx 30 seconds

## 2022-08-11 NOTE — ED Notes (Signed)
Neb tx now in progress

## 2022-08-12 ENCOUNTER — Observation Stay (HOSPITAL_COMMUNITY)
Admission: EM | Admit: 2022-08-12 | Discharge: 2022-08-13 | Disposition: A | Discharge status: Home / Self Care | Payer: BC Managed Care – PPO | Attending: Internal Medicine | Admitting: Internal Medicine

## 2022-08-12 ENCOUNTER — Encounter (HOSPITAL_COMMUNITY): Payer: Self-pay

## 2022-08-12 ENCOUNTER — Other Ambulatory Visit: Payer: Self-pay

## 2022-08-12 DIAGNOSIS — Z7985 Long-term (current) use of injectable non-insulin antidiabetic drugs: Secondary | ICD-10-CM | POA: Diagnosis not present

## 2022-08-12 DIAGNOSIS — Z1152 Encounter for screening for COVID-19: Secondary | ICD-10-CM | POA: Diagnosis not present

## 2022-08-12 DIAGNOSIS — F17200 Nicotine dependence, unspecified, uncomplicated: Secondary | ICD-10-CM | POA: Diagnosis present

## 2022-08-12 DIAGNOSIS — Z79899 Other long term (current) drug therapy: Secondary | ICD-10-CM | POA: Insufficient documentation

## 2022-08-12 DIAGNOSIS — J45901 Unspecified asthma with (acute) exacerbation: Secondary | ICD-10-CM | POA: Diagnosis not present

## 2022-08-12 DIAGNOSIS — F17210 Nicotine dependence, cigarettes, uncomplicated: Secondary | ICD-10-CM | POA: Diagnosis not present

## 2022-08-12 DIAGNOSIS — D72829 Elevated white blood cell count, unspecified: Secondary | ICD-10-CM | POA: Insufficient documentation

## 2022-08-12 DIAGNOSIS — J4540 Moderate persistent asthma, uncomplicated: Secondary | ICD-10-CM

## 2022-08-12 DIAGNOSIS — E7800 Pure hypercholesterolemia, unspecified: Secondary | ICD-10-CM | POA: Diagnosis present

## 2022-08-12 DIAGNOSIS — J209 Acute bronchitis, unspecified: Secondary | ICD-10-CM | POA: Diagnosis not present

## 2022-08-12 DIAGNOSIS — E1165 Type 2 diabetes mellitus with hyperglycemia: Secondary | ICD-10-CM | POA: Diagnosis not present

## 2022-08-12 DIAGNOSIS — J4541 Moderate persistent asthma with (acute) exacerbation: Secondary | ICD-10-CM | POA: Diagnosis present

## 2022-08-12 DIAGNOSIS — Z7984 Long term (current) use of oral hypoglycemic drugs: Secondary | ICD-10-CM | POA: Diagnosis not present

## 2022-08-12 DIAGNOSIS — Z720 Tobacco use: Secondary | ICD-10-CM | POA: Diagnosis present

## 2022-08-12 DIAGNOSIS — R059 Cough, unspecified: Secondary | ICD-10-CM | POA: Diagnosis present

## 2022-08-12 LAB — CBC WITH DIFFERENTIAL/PLATELET
Abs Immature Granulocytes: 0.09 10*3/uL — ABNORMAL HIGH (ref 0.00–0.07)
Abs Immature Granulocytes: 0.09 10*3/uL — ABNORMAL HIGH (ref 0.00–0.07)
Basophils Absolute: 0 10*3/uL (ref 0.0–0.1)
Basophils Absolute: 0.1 10*3/uL (ref 0.0–0.1)
Basophils Relative: 0 %
Basophils Relative: 0 %
Eosinophils Absolute: 0 10*3/uL (ref 0.0–0.5)
Eosinophils Absolute: 0.6 10*3/uL — ABNORMAL HIGH (ref 0.0–0.5)
Eosinophils Relative: 0 %
Eosinophils Relative: 3 %
HCT: 42.3 % (ref 36.0–46.0)
HCT: 43.1 % (ref 36.0–46.0)
Hemoglobin: 14.7 g/dL (ref 12.0–15.0)
Hemoglobin: 14.8 g/dL (ref 12.0–15.0)
Immature Granulocytes: 0 %
Immature Granulocytes: 1 %
Lymphocytes Relative: 11 %
Lymphocytes Relative: 5 %
Lymphs Abs: 0.9 10*3/uL (ref 0.7–4.0)
Lymphs Abs: 2.7 10*3/uL (ref 0.7–4.0)
MCH: 30.6 pg (ref 26.0–34.0)
MCH: 31.7 pg (ref 26.0–34.0)
MCHC: 34.3 g/dL (ref 30.0–36.0)
MCHC: 34.8 g/dL (ref 30.0–36.0)
MCV: 89.2 fL (ref 80.0–100.0)
MCV: 91.4 fL (ref 80.0–100.0)
Monocytes Absolute: 0.2 10*3/uL (ref 0.1–1.0)
Monocytes Absolute: 0.8 10*3/uL (ref 0.1–1.0)
Monocytes Relative: 1 %
Monocytes Relative: 3 %
Neutro Abs: 17.5 10*3/uL — ABNORMAL HIGH (ref 1.7–7.7)
Neutro Abs: 19.6 10*3/uL — ABNORMAL HIGH (ref 1.7–7.7)
Neutrophils Relative %: 83 %
Neutrophils Relative %: 93 %
Platelets: 383 10*3/uL (ref 150–400)
Platelets: 395 10*3/uL (ref 150–400)
RBC: 4.63 MIL/uL (ref 3.87–5.11)
RBC: 4.83 MIL/uL (ref 3.87–5.11)
RDW: 12.2 % (ref 11.5–15.5)
RDW: 12.6 % (ref 11.5–15.5)
WBC: 18.7 10*3/uL — ABNORMAL HIGH (ref 4.0–10.5)
WBC: 23.9 10*3/uL — ABNORMAL HIGH (ref 4.0–10.5)
nRBC: 0 % (ref 0.0–0.2)
nRBC: 0 % (ref 0.0–0.2)

## 2022-08-12 LAB — BASIC METABOLIC PANEL
Anion gap: 10 (ref 5–15)
Anion gap: 15 (ref 5–15)
BUN: 7 mg/dL (ref 6–20)
BUN: 8 mg/dL (ref 6–20)
CO2: 21 mmol/L — ABNORMAL LOW (ref 22–32)
CO2: 25 mmol/L (ref 22–32)
Calcium: 8.9 mg/dL (ref 8.9–10.3)
Calcium: 9 mg/dL (ref 8.9–10.3)
Chloride: 93 mmol/L — ABNORMAL LOW (ref 98–111)
Chloride: 98 mmol/L (ref 98–111)
Creatinine, Ser: 0.76 mg/dL (ref 0.44–1.00)
Creatinine, Ser: 0.83 mg/dL (ref 0.44–1.00)
GFR, Estimated: >60 mL/min (ref >60)
GFR, Estimated: >60 mL/min (ref >60)
Glucose, Bld: 386 mg/dL — ABNORMAL HIGH (ref 70–99)
Glucose, Bld: 533 mg/dL (ref 70–99)
Potassium: 3.9 mmol/L (ref 3.5–5.1)
Potassium: 4 mmol/L (ref 3.5–5.1)
Sodium: 128 mmol/L — ABNORMAL LOW (ref 135–145)
Sodium: 134 mmol/L — ABNORMAL LOW (ref 135–145)

## 2022-08-12 LAB — TROPONIN I (HIGH SENSITIVITY)
Troponin I (High Sensitivity): 4 ng/L (ref <18)
Troponin I (High Sensitivity): 4 ng/L (ref <18)

## 2022-08-12 LAB — SARS CORONAVIRUS 2 BY RT PCR: SARS Coronavirus 2 by RT PCR: NEGATIVE

## 2022-08-12 LAB — CBG MONITORING, ED
Glucose-Capillary: 516 mg/dL (ref 70–99)
Glucose-Capillary: 300 mg/dL — ABNORMAL HIGH (ref 70–99)

## 2022-08-12 LAB — I-STAT BETA HCG BLOOD, ED (MC, WL, AP ONLY): I-stat hCG, quantitative: <5 m[IU]/mL (ref <5)

## 2022-08-12 LAB — D-DIMER, QUANTITATIVE: D-Dimer, Quant: 0.29 ug/mL-FEU (ref 0.00–0.50)

## 2022-08-12 MED ORDER — INSULIN ASPART 100 UNIT/ML IJ SOLN
10.0000 [IU] | Freq: Once | INTRAMUSCULAR | Status: AC
  Administered 2022-08-12: 10 [IU] via SUBCUTANEOUS

## 2022-08-12 MED ORDER — HYDROCODONE-ACETAMINOPHEN 5-325 MG PO TABS
1.0000 | ORAL_TABLET | Freq: Once | ORAL | Status: AC
  Administered 2022-08-12: 1 via ORAL
  Filled 2022-08-12: qty 1

## 2022-08-12 MED ORDER — IPRATROPIUM-ALBUTEROL 0.5-2.5 (3) MG/3ML IN SOLN
3.0000 mL | Freq: Once | RESPIRATORY_TRACT | Status: AC
  Administered 2022-08-12: 3 mL via RESPIRATORY_TRACT
  Filled 2022-08-12: qty 3

## 2022-08-12 MED ORDER — LACTATED RINGERS IV BOLUS
1000.0000 mL | Freq: Once | INTRAVENOUS | Status: AC
  Administered 2022-08-12: 1000 mL via INTRAVENOUS

## 2022-08-12 MED ORDER — KETOROLAC TROMETHAMINE 30 MG/ML IJ SOLN
30.0000 mg | Freq: Once | INTRAMUSCULAR | Status: AC
  Administered 2022-08-12: 30 mg via INTRAVENOUS
  Filled 2022-08-12: qty 1

## 2022-08-12 MED ORDER — SODIUM CHLORIDE 0.9 % IV BOLUS
1000.0000 mL | Freq: Once | INTRAVENOUS | Status: AC
  Administered 2022-08-12: 1000 mL via INTRAVENOUS

## 2022-08-12 MED ORDER — MAGNESIUM SULFATE 2 GM/50ML IV SOLN
2.0000 g | Freq: Once | INTRAVENOUS | Status: AC
  Administered 2022-08-12: 2 g via INTRAVENOUS
  Filled 2022-08-12: qty 50

## 2022-08-12 NOTE — ED Notes (Signed)
Entered room to begin going over discharge instructions and pt was dressed with vital sign cables removed -- pt had pulled cobane from the drawer and states "I was getting ready to remove it myself" referring to SL -- pt tearful and agitated - pt verbalizes she is upset that she is being discharged - states she "just needed another treatment because I couldn't breathe" and states "what's gonna happen when I need to retun in 2 hours" - educated pt that I would have Dr. Karle Starch return to speak to her however when I returned after speaking with Dr. Karle Starch who states he will return to speak with pt and to ambulate pt on pulse ox in the meantime pt had her coat on and states she was leaving - asked for her d/c paperwork and state she will go to another hospital.  Dr Karle Starch made aware

## 2022-08-12 NOTE — ED Notes (Signed)
Pt ambulated with pulse O2. O2 was 98.

## 2022-08-12 NOTE — ED Triage Notes (Addendum)
Wheezing sats in the 80's.  Dc'd from [place] this am at 0300.  2 duonebs, 10mg  albuterol 1mg  atrovent solumedrol 125mg  20g AC all given by EMS.  After meds BP 108/60 HR 110 96% on neb tx.   Patient has been seen 3 times in the last week for the same

## 2022-08-12 NOTE — ED Notes (Signed)
RT now at bedside -- pt has just coughed up dark green mucous and is reporting "pian pill not working - will notify Dr. Karle Starch

## 2022-08-12 NOTE — ED Notes (Signed)
Nurse notified pt has called out reporting "hard to breathe" - entered room to find pt tearful -- O2 sats 96-98% on RA -- intermittent nonprod cough noted - expiratory wheezing to posterior bases with auscultation - while this nurse at bedside Dr. Karle Starch in to assess.

## 2022-08-12 NOTE — ED Provider Triage Note (Addendum)
Emergency Medicine Provider Triage Evaluation Note  [name] , a 40 y.o. female  was evaluated in triage.  Pt complains of shortness of breath and wheezing.  Patient has a history of asthma, recent frequent ED visits for same.  Seen at [place] last night and discharged about 11 hours ago for the same.  She has had steroid burst recently.  EMS transported patient.  She was hypoxic and started on albuterol treatments.  Also 125mg  solumedrol.  States improvement during transport.  Labs and chest x-ray performed this morning.  Review of Systems  Positive: Shortness of breath Negative: Fever  Physical Exam  LMP 07/31/2022 (Exact Date)  Gen:   Awake, no distress   Resp:  On nebulizer treatment, moderate expiratory wheezing in all fields MSK:   Moves extremities without difficulty  Other:    Medical Decision Making  Medically screening exam initiated at 2:47 PM.  Appropriate orders placed.  [name] was informed that the remainder of the evaluation will be completed by another provider, this initial triage assessment does not replace that evaluation, and the importance of remaining in the ED until their evaluation is complete.     Carlisle Cater, PA-C 08/12/22 1450

## 2022-08-12 NOTE — ED Provider Notes (Signed)
[facility] EMERGENCY DEPARTMENT Provider Note   CSN: [id number] Arrival date & time: 08/12/22  1448     History  Chief Complaint  Patient presents with   Asthma    [name] is a 41 y.o. female.  Patient brought in by EMS with coughing, wheezing, concern for asthma exacerbation.  She was seen last night for the same.  States she has been sick for the past 6 weeks with cough, congestion, runny nose and sore throat.  She is been treated multiple times for bronchitis and asthma exacerbations.  Last finished steroids about a week ago and antibiotics 2 weeks ago.  Was hypoxic for EMS with sats in the 80s and she was given 2 breathing treatments, 125 mg of Solu-Medrol.  She complains of pain to her chest and her back which has been constant for the past month.  Cough productive of yellow mucus at [place] yesterday.  Also has congestion and runny nose. No abdominal pain.  1 episode of posttussive emesis Appears that she left before reassessment last night.  The history is provided by the patient.  Asthma Associated symptoms include headaches and shortness of breath. Pertinent negatives include no chest pain and no abdominal pain.       Home Medications Prior to Admission medications   Medication Sig Start Date End Date Taking? Authorizing Provider  acetaminophen (TYLENOL) 500 MG tablet Take 1 tablet (500 mg total) by mouth every 6 (six) hours as needed. 04/26/22   Redwine, Madison A, PA-C  albuterol (VENTOLIN HFA) 108 (90 Base) MCG/ACT inhaler Inhale 1-2 puffs into the lungs every 6 (six) hours as needed for wheezing or shortness of breath. 12/02/21   Cobb, Ruby Cola, NP  amphetamine-dextroamphetamine (ADDERALL) 30 MG tablet Take 30 mg by mouth 2 (two) times daily with breakfast and lunch. 09/28/20   [provider]  ARIPiprazole (ABILIFY) 10 MG tablet Take 10 mg by mouth every morning. 01/21/21   [provider]  BD PEN NEEDLE NANO  2ND GEN 32G X 4 MM MISC daily. 09/23/20   [provider]  benzonatate (TESSALON) 100 MG capsule Take 1 capsule (100 mg total) by mouth every 8 (eight) hours. 08/04/22   Sabas Sous, MD  Cholecalciferol (VITAMIN D3 PO) Take 1 tablet by mouth every morning.    [provider]  cyclobenzaprine (FLEXERIL) 10 MG tablet Take 1 tablet (10 mg total) by mouth 2 (two) times daily as needed for muscle spasms. 04/08/22   Alvira Monday, MD  doxycycline (VIBRAMYCIN) 100 MG capsule Take 1 capsule (100 mg total) by mouth 2 (two) times daily. One po bid x 7 days 07/29/22   Melene Plan, DO  Empagliflozin-metFORMIN HCl (SYNJARDY) 12.02-999 MG TABS Take 1 tablet by mouth 2 (two) times daily. 02/12/21   [provider]  ezetimibe (ZETIA) 10 MG tablet Take 1 tablet (10 mg total) by mouth daily. 09/26/17   Bing Neighbors, FNP  fluticasone-salmeterol (ADVAIR) 250-50 MCG/ACT AEPB Inhale 1 puff into the lungs every 12 (twelve) hours. 12/02/21   Cobb, Ruby Cola, NP  gabapentin (NEURONTIN) 100 MG capsule Take 1 capsule (100 mg total) by mouth daily after breakfast. Patient taking differently: Take 100 mg by mouth every morning. 02/14/20   Kerrin Champagne, MD  gabapentin (NEURONTIN) 300 MG capsule Take 1-2 capsules (300-600 mg total) by mouth at bedtime. Only take as prescribed.  Husband is to administer medication Patient taking differently: Take 600 mg by mouth at  bedtime. Take only as prescribed. Husband is to administer medication 02/14/20   Kerrin Champagne, MD  ibuprofen (ADVIL) 600 MG tablet Take 1 tablet (600 mg total) by mouth every 6 (six) hours as needed. 04/26/22   Redwine, Madison A, PA-C  lisinopril (PRINIVIL,ZESTRIL) 2.5 MG tablet Take 1 tablet (2.5 mg total) by mouth daily. 05/10/17   Bing Neighbors, FNP  loratadine (CLARITIN) 10 MG tablet Take 1 tablet (10 mg total) by mouth daily. 12/02/21   Cobb, Ruby Cola, NP  montelukast (SINGULAIR) 10 MG tablet Take 1 tablet (10 mg total) by  mouth at bedtime. 12/02/21   Cobb, Ruby Cola, NP  Multiple Vitamin (MULTIVITAMIN WITH MINERALS) TABS tablet Take 1 tablet by mouth every morning.    [provider]  Omega-3 Fatty Acids (FISH OIL PO) Take 1 capsule by mouth every morning.    [provider]  pravastatin (PRAVACHOL) 80 MG tablet Take 1 tablet (80 mg total) by mouth daily. Patient taking differently: Take 80 mg by mouth every morning. 05/11/17   Bing Neighbors, FNP  Semaglutide, 1 MG/DOSE, (OZEMPIC, 1 MG/DOSE,) 4 MG/3ML SOPN Inject 1 mg into the skin every Wednesday. 02/12/21   [provider]  sertraline (ZOLOFT) 100 MG tablet Take 2 tablets (200 mg total) by mouth daily. For depression and anxiety. 06/13/13   Tamala Julian, PA-C  traZODone (DESYREL) 100 MG tablet Take 200 mg by mouth at bedtime. 01/21/21   [provider]      Allergies    Orange oil, Penicillins, Carisoprodol, and Piroxicam    Review of Systems   Review of Systems  Constitutional:  Positive for activity change, appetite change and fatigue.  HENT:  Positive for congestion and rhinorrhea.   Respiratory:  Positive for chest tightness and shortness of breath.   Cardiovascular:  Negative for chest pain.  Gastrointestinal:  Negative for abdominal pain.  Genitourinary:  Negative for dysuria and hematuria.  Musculoskeletal:  Positive for arthralgias and myalgias.  Neurological:  Positive for weakness and headaches.   all other systems are negative except as noted in the HPI and PMH.    Physical Exam Updated Vital Signs BP 119/69 (BP Location: Right Arm)   Pulse (!) 119   Temp 98.3 F (36.8 C)   Resp 20   Ht 5\' 6"  (1.676 m)   Wt 95.3 kg   LMP 07/31/2022 (Exact Date)   SpO2 93%   BMI 33.89 kg/m  Physical Exam Vitals and nursing note reviewed.  Constitutional:      General: She is not in acute distress.    Appearance: She is well-developed. She is ill-appearing.  HENT:     Head: Normocephalic and atraumatic.      Nose: Congestion and rhinorrhea present.     Mouth/Throat:     Pharynx: No oropharyngeal exudate.  Eyes:     Conjunctiva/sclera: Conjunctivae normal.     Pupils: Pupils are equal, round, and reactive to light.  Neck:     Comments: No meningismus. Cardiovascular:     Rate and Rhythm: Normal rate and regular rhythm.     Heart sounds: Normal heart sounds. No murmur heard. Pulmonary:     Effort: Pulmonary effort is normal. No respiratory distress.     Breath sounds: Wheezing present.     Comments: Diminished breath sounds with expiratory wheezing bilateraly Abdominal:     Palpations: Abdomen is soft.     Tenderness: There is no abdominal tenderness. There is no guarding or  rebound.  Musculoskeletal:        General: No tenderness. Normal range of motion.     Cervical back: Normal range of motion and neck supple.  Skin:    General: Skin is warm.  Neurological:     Mental Status: She is alert and oriented to person, place, and time.     Cranial Nerves: No cranial nerve deficit.     Motor: No abnormal muscle tone.     Coordination: Coordination normal.     Comments:  5/5 strength throughout. CN 2-12 intact.Equal grip strength.   Psychiatric:        Behavior: Behavior normal.     ED Results / Procedures / Treatments   Labs (all labs ordered are listed, but only abnormal results are displayed) Labs Reviewed  CBC WITH DIFFERENTIAL/PLATELET - Abnormal; Notable for the following components:      Result Value   WBC 18.7 (*)    Neutro Abs 17.5 (*)    Abs Immature Granulocytes 0.09 (*)    All other components within normal limits  BASIC METABOLIC PANEL - Abnormal; Notable for the following components:   Sodium 134 (*)    CO2 21 (*)    Glucose, Bld 386 (*)    All other components within normal limits  SARS CORONAVIRUS 2 BY RT PCR  D-DIMER, QUANTITATIVE  I-STAT BETA HCG BLOOD, ED ([facility], [facility], [facility] ONLY)  TROPONIN I (HIGH SENSITIVITY)  TROPONIN I (HIGH SENSITIVITY)     EKG None  Radiology DG Chest 2 View  Result Date: 08/11/2022 CLINICAL DATA:  Dyspnea EXAM: CHEST - 2 VIEW COMPARISON:  08/04/2022 FINDINGS: The heart size and mediastinal contours are within normal limits. Both lungs are clear. The visualized skeletal structures are unremarkable. IMPRESSION: No active cardiopulmonary disease. Electronically Signed   By: Fidela Salisbury M.D.   On: 08/11/2022 23:35    Procedures Procedures    Medications Ordered in ED Medications  lactated ringers bolus 1,000 mL (has no administration in time range)  ipratropium-albuterol (DUONEB) 0.5-2.5 (3) MG/3ML nebulizer solution 3 mL (has no administration in time range)  magnesium sulfate IVPB 2 g 50 mL (has no administration in time range)  lactated ringers bolus 1,000 mL (has no administration in time range)    ED Course/ Medical Decision Making/ A&P                           Medical Decision Making Amount and/or Complexity of Data Reviewed Labs: ordered. Decision-making details documented in ED Course. Radiology: ordered and independent interpretation performed. Decision-making details documented in ED Course. ECG/medicine tests: ordered and independent interpretation performed. Decision-making details documented in ED Course.  Risk Prescription drug management. Decision regarding hospitalization.  Difficulty breathing, cough, wheezing, congestion.  Hypoxic for EMS.  Improved with bronchodilators, steroids, magnesium.  Chest x-ray yesterday did not show any infiltrate or pneumothorax.  Labs today show hyperglycemia without DKA.  Patient given IV fluids. Leukocytosis likely secondary to recent steroid use.  D-dimer is negative.  Troponin negative.  Low suspicion for ACS or pulmonary embolism.  Patient remains tachypneic and wheezing and coughing.  She is able to ambulate without hypoxia but does become quite short of breath.  States she feels no better than when she arrived and feels worse than she  did last night.  We will plan admission for suspected asthma exacerbation.  Discussed smoking cessation with patient.  Admission discussed with Dr. Hal Hope.  Final Clinical Impression(s) / ED Diagnoses Final diagnoses:  None    Rx / DC Orders ED Discharge Orders     None         Zianna Dercole, Jeannett Senior, MD 08/12/22 2236

## 2022-08-12 NOTE — ED Notes (Signed)
Pt is reporting back pain - states she has chronic back issues - MD notified and asked for pain med orders

## 2022-08-12 NOTE — ED Notes (Addendum)
Dr. Karle Starch aware of blood sugar 533.

## 2022-08-13 ENCOUNTER — Encounter (HOSPITAL_COMMUNITY): Payer: Self-pay | Admitting: Internal Medicine

## 2022-08-13 ENCOUNTER — Observation Stay (HOSPITAL_COMMUNITY): Payer: BC Managed Care – PPO

## 2022-08-13 DIAGNOSIS — J4541 Moderate persistent asthma with (acute) exacerbation: Secondary | ICD-10-CM | POA: Diagnosis not present

## 2022-08-13 DIAGNOSIS — J209 Acute bronchitis, unspecified: Secondary | ICD-10-CM | POA: Diagnosis present

## 2022-08-13 LAB — RESPIRATORY PANEL BY PCR

## 2022-08-13 LAB — CREATININE, SERUM
Creatinine, Ser: 0.73 mg/dL (ref 0.44–1.00)
GFR, Estimated: >60 mL/min (ref >60)

## 2022-08-13 LAB — HEMOGLOBIN A1C
Hgb A1c MFr Bld: 10.5 % — ABNORMAL HIGH (ref 4.8–5.6)
Mean Plasma Glucose: 254.65 mg/dL

## 2022-08-13 LAB — CBC
HCT: 41.2 % (ref 36.0–46.0)
HCT: 40.9 % (ref 36.0–46.0)
Hemoglobin: 14.4 g/dL (ref 12.0–15.0)
Hemoglobin: 14 g/dL (ref 12.0–15.0)
MCH: 31.6 pg (ref 26.0–34.0)
MCH: 31.3 pg (ref 26.0–34.0)
MCHC: 35 g/dL (ref 30.0–36.0)
MCHC: 34.2 g/dL (ref 30.0–36.0)
MCV: 90.5 fL (ref 80.0–100.0)
MCV: 91.3 fL (ref 80.0–100.0)
Platelets: 385 10*3/uL (ref 150–400)
Platelets: 386 10*3/uL (ref 150–400)
RBC: 4.55 MIL/uL (ref 3.87–5.11)
RBC: 4.48 MIL/uL (ref 3.87–5.11)
RDW: 12.8 % (ref 11.5–15.5)
RDW: 12.8 % (ref 11.5–15.5)
WBC: 19.6 10*3/uL — ABNORMAL HIGH (ref 4.0–10.5)
WBC: 18.6 10*3/uL — ABNORMAL HIGH (ref 4.0–10.5)
nRBC: 0 % (ref 0.0–0.2)
nRBC: 0 % (ref 0.0–0.2)

## 2022-08-13 LAB — BASIC METABOLIC PANEL
Anion gap: 9 (ref 5–15)
BUN: 10 mg/dL (ref 6–20)
CO2: 23 mmol/L (ref 22–32)
Calcium: 8.5 mg/dL — ABNORMAL LOW (ref 8.9–10.3)
Chloride: 100 mmol/L (ref 98–111)
Creatinine, Ser: 0.68 mg/dL (ref 0.44–1.00)
GFR, Estimated: >60 mL/min (ref >60)
Glucose, Bld: 368 mg/dL — ABNORMAL HIGH (ref 70–99)
Potassium: 4.5 mmol/L (ref 3.5–5.1)
Sodium: 132 mmol/L — ABNORMAL LOW (ref 135–145)

## 2022-08-13 LAB — CBG MONITORING, ED
Glucose-Capillary: 318 mg/dL — ABNORMAL HIGH (ref 70–99)
Glucose-Capillary: 257 mg/dL — ABNORMAL HIGH (ref 70–99)

## 2022-08-13 LAB — HIV ANTIBODY (ROUTINE TESTING W REFLEX): HIV Screen 4th Generation wRfx: NONREACTIVE

## 2022-08-13 MED ORDER — TRAZODONE HCL 50 MG PO TABS: 200.0000 mg | ORAL_TABLET | Freq: Every day | ORAL | Status: DC

## 2022-08-13 MED ORDER — METHYLPREDNISOLONE SODIUM SUCC 125 MG IJ SOLR
80.0000 mg | Freq: Every day | INTRAMUSCULAR | Status: DC
  Administered 2022-08-13: 80 mg via INTRAVENOUS
  Filled 2022-08-13: qty 2

## 2022-08-13 MED ORDER — AZITHROMYCIN 250 MG PO TABS: ORAL_TABLET | ORAL | 0 refills | Status: AC

## 2022-08-13 MED ORDER — BREO ELLIPTA 50-25 MCG/INH IN AEPB: 1.0000 | INHALATION_SPRAY | Freq: Every day | RESPIRATORY_TRACT | 1 refills | Status: AC

## 2022-08-13 MED ORDER — OMEGA-3-ACID ETHYL ESTERS 1 G PO CAPS
1.0000 g | ORAL_CAPSULE | Freq: Every day | ORAL | Status: DC
  Administered 2022-08-13: 1 g via ORAL
  Filled 2022-08-13: qty 1

## 2022-08-13 MED ORDER — LISINOPRIL 2.5 MG PO TABS
2.5000 mg | ORAL_TABLET | Freq: Every day | ORAL | Status: DC
  Administered 2022-08-13: 2.5 mg via ORAL
  Filled 2022-08-13: qty 1

## 2022-08-13 MED ORDER — GABAPENTIN 300 MG PO CAPS
600.0000 mg | ORAL_CAPSULE | Freq: Every day | ORAL | Status: DC
  Administered 2022-08-13: 600 mg via ORAL
  Filled 2022-08-13: qty 2

## 2022-08-13 MED ORDER — FENTANYL CITRATE PF 50 MCG/ML IJ SOSY
25.0000 ug | PREFILLED_SYRINGE | Freq: Once | INTRAMUSCULAR | Status: AC
  Administered 2022-08-13: 25 ug via INTRAVENOUS
  Filled 2022-08-13: qty 1

## 2022-08-13 MED ORDER — BUDESONIDE 0.25 MG/2ML IN SUSP
0.2500 mg | Freq: Two times a day (BID) | RESPIRATORY_TRACT | Status: DC
  Administered 2022-08-13: 0.25 mg via RESPIRATORY_TRACT
  Filled 2022-08-13 (×2): qty 2

## 2022-08-13 MED ORDER — OXYCODONE HCL 5 MG PO TABS
2.5000 mg | ORAL_TABLET | Freq: Once | ORAL | Status: AC
  Administered 2022-08-13: 2.5 mg via ORAL
  Filled 2022-08-13: qty 1

## 2022-08-13 MED ORDER — INSULIN GLARGINE-YFGN 100 UNIT/ML ~~LOC~~ SOLN
10.0000 [IU] | Freq: Every day | SUBCUTANEOUS | Status: DC
  Administered 2022-08-13: 10 [IU] via SUBCUTANEOUS
  Filled 2022-08-13: qty 0.1

## 2022-08-13 MED ORDER — ACETAMINOPHEN 650 MG RE SUPP: 650.0000 mg | Freq: Four times a day (QID) | RECTAL | Status: DC | PRN

## 2022-08-13 MED ORDER — ARIPIPRAZOLE 10 MG PO TABS
10.0000 mg | ORAL_TABLET | Freq: Every morning | ORAL | Status: DC
  Administered 2022-08-13: 10 mg via ORAL
  Filled 2022-08-13: qty 1

## 2022-08-13 MED ORDER — PREDNISONE 10 MG PO TABS: ORAL_TABLET | ORAL | 0 refills | Status: DC

## 2022-08-13 MED ORDER — STERILE WATER FOR INJECTION IJ SOLN
INTRAMUSCULAR | Status: AC
  Administered 2022-08-13: 10 mL
  Filled 2022-08-13: qty 10

## 2022-08-13 MED ORDER — GABAPENTIN 100 MG PO CAPS
100.0000 mg | ORAL_CAPSULE | Freq: Every morning | ORAL | Status: DC
  Administered 2022-08-13: 100 mg via ORAL
  Filled 2022-08-13: qty 1

## 2022-08-13 MED ORDER — EZETIMIBE 10 MG PO TABS
10.0000 mg | ORAL_TABLET | Freq: Every day | ORAL | Status: DC
  Administered 2022-08-13: 10 mg via ORAL
  Filled 2022-08-13: qty 1

## 2022-08-13 MED ORDER — SODIUM CHLORIDE 0.9 % IV SOLN
500.0000 mg | INTRAVENOUS | Status: DC
  Administered 2022-08-13: 500 mg via INTRAVENOUS
  Filled 2022-08-13: qty 5

## 2022-08-13 MED ORDER — IPRATROPIUM-ALBUTEROL 0.5-2.5 (3) MG/3ML IN SOLN: 3.0000 mL | RESPIRATORY_TRACT | Status: DC | PRN

## 2022-08-13 MED ORDER — IPRATROPIUM-ALBUTEROL 0.5-2.5 (3) MG/3ML IN SOLN
3.0000 mL | RESPIRATORY_TRACT | Status: DC
  Administered 2022-08-13 (×3): 3 mL via RESPIRATORY_TRACT
  Filled 2022-08-13 (×3): qty 3

## 2022-08-13 MED ORDER — ALBUTEROL SULFATE HFA 108 (90 BASE) MCG/ACT IN AERS: 1.0000 | INHALATION_SPRAY | Freq: Four times a day (QID) | RESPIRATORY_TRACT | 0 refills | Status: DC | PRN

## 2022-08-13 MED ORDER — INSULIN ASPART 100 UNIT/ML IJ SOLN
0.0000 [IU] | Freq: Three times a day (TID) | INTRAMUSCULAR | Status: DC
  Administered 2022-08-13: 8 [IU] via SUBCUTANEOUS

## 2022-08-13 MED ORDER — ENOXAPARIN SODIUM 40 MG/0.4ML IJ SOSY
40.0000 mg | PREFILLED_SYRINGE | Freq: Every day | INTRAMUSCULAR | Status: DC
  Administered 2022-08-13: 40 mg via SUBCUTANEOUS
  Filled 2022-08-13: qty 0.4

## 2022-08-13 MED ORDER — PRAVASTATIN SODIUM 40 MG PO TABS
80.0000 mg | ORAL_TABLET | Freq: Every morning | ORAL | Status: DC
  Administered 2022-08-13: 80 mg via ORAL
  Filled 2022-08-13: qty 2

## 2022-08-13 MED ORDER — ACETAMINOPHEN 325 MG PO TABS: 650.0000 mg | ORAL_TABLET | Freq: Four times a day (QID) | ORAL | Status: DC | PRN

## 2022-08-13 MED ORDER — SERTRALINE HCL 100 MG PO TABS
200.0000 mg | ORAL_TABLET | Freq: Every day | ORAL | Status: DC
  Administered 2022-08-13: 200 mg via ORAL
  Filled 2022-08-13: qty 2

## 2022-08-13 NOTE — ED Notes (Signed)
Dr. Kakrakandy at bedside. 

## 2022-08-13 NOTE — Discharge Summary (Signed)
Physician Discharge Summary  [name] XNA:[id number] DOB: 06/11/1981 DOA: 08/12/2022  PCP: Jettie Booze, NP  Admit date: 08/12/2022 Discharge date: 08/13/2022  Admitted From: Home Disposition:  Home   Recommendations for Outpatient Follow-up:  Follow up with PCP in 1 week for diabetic control. Outpatient pulmonology referral placed Advised to quit smoking   Discharge Condition: Stable CODE STATUS: Full  Diet recommendation: Carb modified   Brief/Interim Summary: From H&P by Dr. Hal Hope: "[name] is a 41 y.o. female with history of diabetes mellitus type 2, hyperlipidemia chronic back pain, depression presents to the ER with complaints of worsening shortness of breath.  Patient states she has been having wheezing and shortness of breath for the last 6 weeks and has come to the ER at least 4 times and had at least 2 courses of antibiotics and steroids.  Last visit was about 24 hours ago.  Denies any chest pain.  Denies any recent travel.  Patient's husband was sick over the last 2 weeks with similar symptoms.   ED Course: In the ER patient was found to be wheezing chest x-ray not showing the acute Labs show leukocytosis troponins are negative.  Patient admitted for further observation.  Blood sugar has been elevated with a hemoglobin A1c checked today was 10.5."  Subjective on day of discharge: Patient feeling much better with steroid and nebulizer treatment.  Respiratory viral panel, COVID negative.  On day of discharge, patient voiced wanting to go home.  We discussed smoking cessation and outpatient pulmonology referral.  She remained stable on room air prior to discharge.  Discharge Diagnoses:   Principal Problem:   Asthma exacerbation Active Problems:   HYPERCHOLESTEROLEMIA   TOBACCO ABUSE   Type 2 diabetes mellitus with hyperglycemia ([place])   Acute bronchitis    Discharge Instructions  Discharge Instructions     Ambulatory referral to  Pulmonology   Complete by: As directed    Reason for referral: Asthma/COPD   Call MD for:  difficulty breathing, headache or visual disturbances   Complete by: As directed    Call MD for:  extreme fatigue   Complete by: As directed    Call MD for:  persistant dizziness or light-headedness   Complete by: As directed    Call MD for:  persistant nausea and vomiting   Complete by: As directed    Call MD for:  severe uncontrolled pain   Complete by: As directed    Call MD for:  temperature >100.4   Complete by: As directed    Diet Carb Modified   Complete by: As directed    Discharge instructions   Complete by: As directed    You were cared for by a hospitalist during your hospital stay. If you have any questions about your discharge medications or the care you received while you were in the hospital after you are discharged, you can call the unit and ask to speak with the hospitalist on call if the hospitalist that took care of you is not available. Once you are discharged, your primary care physician will handle any further medical issues. Please note that NO REFILLS for any discharge medications will be authorized once you are discharged, as it is imperative that you return to your primary care physician (or establish a relationship with a primary care physician if you do not have one) for your aftercare needs so that they can reassess your need for medications and monitor your lab values.  Increase activity slowly   Complete by: As directed       Allergies as of 08/13/2022       Reactions   Orange Oil Shortness Of Breath   Penicillins Itching, Rash   Carisoprodol Other (See Comments)   Skin peeled   Piroxicam Other (See Comments)   Skin peeled        Medication List     STOP taking these medications    cyclobenzaprine 10 MG tablet Commonly known as: FLEXERIL   doxycycline 100 MG capsule Commonly known as: VIBRAMYCIN   ibuprofen 600 MG tablet Commonly known as: ADVIL        TAKE these medications    acetaminophen 500 MG tablet Commonly known as: TYLENOL Take 1 tablet (500 mg total) by mouth every 6 (six) hours as needed. What changed: reasons to take this   albuterol 108 (90 Base) MCG/ACT inhaler Commonly known as: VENTOLIN HFA Inhale 1-2 puffs into the lungs every 6 (six) hours as needed for wheezing or shortness of breath.   amphetamine-dextroamphetamine 30 MG tablet Commonly known as: ADDERALL Take 30 mg by mouth daily.   ARIPiprazole 10 MG tablet Commonly known as: ABILIFY Take 10 mg by mouth every morning.   aspirin EC 81 MG tablet Take 81 mg by mouth daily. Swallow whole.   azithromycin 250 MG tablet Commonly known as: Zithromax Z-Pak Take 2 tablets (500 mg) on  Day 1,  followed by 1 tablet (250 mg) once daily on Days 2 through 5.   BD Pen Needle Nano 2nd Gen 32G X 4 MM Misc Generic drug: Insulin Pen Needle daily.   benzonatate 100 MG capsule Commonly known as: TESSALON Take 1 capsule (100 mg total) by mouth every 8 (eight) hours.   Breo Ellipta 50-25 MCG/ACT Aepb Generic drug: Fluticasone Furoate-Vilanterol Inhale 1 puff into the lungs daily in the afternoon.   ezetimibe 10 MG tablet Commonly known as: Zetia Take 1 tablet (10 mg total) by mouth daily.   FISH OIL PO Take 1 capsule by mouth every morning.   gabapentin 300 MG capsule Commonly known as: NEURONTIN Take 1-2 capsules (300-600 mg total) by mouth at bedtime. Only take as prescribed.  Husband is to administer medication What changed:  how much to take additional instructions   gabapentin 100 MG capsule Commonly known as: NEURONTIN Take 1 capsule (100 mg total) by mouth daily after breakfast. What changed: when to take this   lisinopril 2.5 MG tablet Commonly known as: ZESTRIL Take 1 tablet (2.5 mg total) by mouth daily.   multivitamin with minerals Tabs tablet Take 1 tablet by mouth every morning.   Ozempic (1 MG/DOSE) 4 MG/3ML Sopn Generic drug:  Semaglutide (1 MG/DOSE) Inject 1 mg into the skin every Wednesday.   pravastatin 80 MG tablet Commonly known as: PRAVACHOL Take 1 tablet (80 mg total) by mouth daily. What changed: when to take this   predniSONE 10 MG tablet Commonly known as: DELTASONE Take 4 tabs for 3 days, then 3 tabs for 3 days, then 2 tabs for 3 days, then 1 tab for 3 days, then 1/2 tab for 4 days.   sertraline 100 MG tablet Commonly known as: ZOLOFT Take 2 tablets (200 mg total) by mouth daily. For depression and anxiety.   Synjardy 12.02-999 MG Tabs Generic drug: Empagliflozin-metFORMIN HCl Take 1 tablet by mouth 2 (two) times daily.   traZODone 100 MG tablet Commonly known as: DESYREL Take 200 mg by mouth at bedtime.  Follow-up Information     April Manson, NP. Schedule an appointment as soon as possible for a visit in 1 week(s).   Specialty: Family Medicine Why: Diabetes Contact information: [address] [place] [address] [phone]         [facility] Follow up.   Specialty: Pulmonology Why: Outpatient referral placed Contact information: [address] [place] [address] [phone]               Allergies  Allergen Reactions   Orange Oil Shortness Of Breath   Penicillins Itching and Rash   Carisoprodol Other (See Comments)    Skin peeled    Piroxicam Other (See Comments)    Skin peeled    Consultations: None    Procedures/Studies: DG CHEST PORT 1 VIEW  Result Date: 08/13/2022 CLINICAL DATA:  Shortness of breath. EXAM: PORTABLE CHEST 1 VIEW COMPARISON:  Chest radiograph dated 08/11/2022. FINDINGS: The heart size and mediastinal contours are within normal limits. Both lungs are clear. The visualized skeletal structures are unremarkable. IMPRESSION: No active disease. Electronically Signed   By: Elgie Collard M.D.   On: 08/13/2022 00:47   DG Chest 2 View  Result Date: 08/11/2022 CLINICAL DATA:   Dyspnea EXAM: CHEST - 2 VIEW COMPARISON:  08/04/2022 FINDINGS: The heart size and mediastinal contours are within normal limits. Both lungs are clear. The visualized skeletal structures are unremarkable. IMPRESSION: No active cardiopulmonary disease. Electronically Signed   By: Helyn Numbers M.D.   On: 08/11/2022 23:35   DG Chest 2 View  Result Date: 08/04/2022 CLINICAL DATA:  Shortness of breath and nonproductive cough. EXAM: CHEST - 2 VIEW COMPARISON:  July 28, 2022 FINDINGS: The heart size and mediastinal contours are within normal limits. There is no evidence of acute infiltrate, pleural effusion or pneumothorax. The visualized skeletal structures are unremarkable. IMPRESSION: No active cardiopulmonary disease. Electronically Signed   By: Aram Candela M.D.   On: 08/04/2022 01:00   DG Chest 2 View  Result Date: 07/28/2022 CLINICAL DATA:  Shortness of breath. EXAM: CHEST - 2 VIEW COMPARISON:  Chest x-ray April 08, 2022. FINDINGS: The heart size and mediastinal contours are within normal limits. Both lungs are clear. No visible pleural effusions or pneumothorax. No acute osseous abnormality. IMPRESSION: No active cardiopulmonary disease. Electronically Signed   By: Feliberto Harts M.D.   On: 07/28/2022 21:36   MR LUMBAR SPINE WO CONTRAST  Result Date: 07/19/2022 CLINICAL DATA:  Initial evaluation for chronic lower back pain with bilateral leg pain. EXAM: MRI LUMBAR SPINE WITHOUT CONTRAST TECHNIQUE: Multiplanar, multisequence MR imaging of the lumbar spine was performed. No intravenous contrast was administered. COMPARISON:  Previous MRI from 09/28/2020. FINDINGS: Segmentation: Standard. Lowest well-formed disc space labeled the L5-S1 level. Alignment:  Mild levoscoliosis.  No significant listhesis. Vertebrae: Vertebral body height maintained without acute or chronic fracture. Bone marrow signal intensity diffusely decreased on T1 weighted sequence, nonspecific, but most commonly related to  anemia, smoking or obesity. No discrete or worrisome osseous lesions. Mild reactive endplate changes present about the L3-4 through L5-S1 interspaces. Associated mild reactive marrow edema about the right aspect of the L4-5 interspace. Additionally, there is abnormal marrow edema involving the right transverse process of L1 (series 3, image 5), likely due to facet arthritis about the adjacent right T12-L1 facet. No other abnormal marrow edema. Conus medullaris and cauda equina: Conus extends to the L1 level. Conus and cauda equina appear normal. Paraspinal and  other soft tissues: Unremarkable. Disc levels: T12-L1: Normal interspace. Right greater than left facet hypertrophy with associated mild reactive marrow edema on the right. No spinal stenosis. Foramina remain patent. L1-2: Normal interspace. Mild right greater than left facet hypertrophy. No canal or foraminal stenosis. L2-3: Normal interspace. Mild-to-moderate left greater than right facet hypertrophy. No spinal stenosis. Foramina remain patent. L3-4: Circumferential disc bulge with disc desiccation. Mild reactive endplate spurring. Superimposed small central disc protrusion indents the ventral thecal sac. Mild facet hypertrophy. Resultant mild bilateral subarticular stenosis with mild bilateral L3 foraminal narrowing. Appearance is mildly progressed. L4-5: Degenerative intervertebral disc space narrowing with diffuse disc bulge and reactive endplate spurring. Superimpose broad based central to left subarticular disc protrusion indents the ventral thecal sac (series 5, image 27). Mild to moderate bilateral facet hypertrophy. Resultant moderate left worse than right subarticular stenosis, with mild bilateral L4 foraminal narrowing. Changes have progressed from prior. L5-S1: Disc desiccation with intervertebral disc space narrowing. Right-sided reactive endplate spurring. Right subarticular disc protrusion contacts and displaces the descending right S1 nerve  root as it courses through the right lateral recess (series 5, image 32). Mild facet spurring. Resultant mild right lateral moderate right lateral recess stenosis. Central canal remains patent. Mild right L5 foraminal narrowing. Appearance has progressed. IMPRESSION: 1. Right subarticular disc protrusion at L5-S1, contacting and displacing the descending right S1 nerve root as it courses through the right lateral recess. This has progressed from previous. 2. Broad central to left subarticular disc protrusion at L4-5 with resultant moderate left worse than right lateral recess stenosis, also progressed from prior. 3. Disc bulge with facet hypertrophy at L3-4 with resultant mild bilateral subarticular stenosis, with mild bilateral L3 foraminal narrowing. 4. Reactive marrow edema about the right T12-L1 and L4-5 facets due to facet arthritis. Findings could contribute to lower back pain. Electronically Signed   By: Rise Mu M.D.   On: 07/19/2022 05:03       Discharge Exam: Vitals:   08/13/22 1028 08/13/22 1036  BP:  110/61  Pulse:    Resp:    Temp: 98.2 F (36.8 C)   SpO2:      General: Pt is alert, awake, not in acute distress Cardiovascular: Tachycardic, regular rhythm, S1/S2 +, no edema Respiratory: CTA bilaterally, no wheezing, no rhonchi, no respiratory distress, no conversational dyspnea, on room air  Abdominal: Soft, NT, ND, bowel sounds + Extremities: no edema, no cyanosis Psych: Normal mood and affect, stable judgement and insight     The results of significant diagnostics from this hospitalization (including imaging, microbiology, ancillary and laboratory) are listed below for reference.     Microbiology: Recent Results (from the past 240 hour(s))  SARS Coronavirus 2 by RT PCR (hospital order, performed in [facility] hospital lab) *cepheid single result test* Anterior Nasal Swab     Status: None   Collection Time: 08/04/22 12:22 AM   Specimen: Anterior Nasal Swab   Result Value Ref Range Status   SARS Coronavirus 2 by RT PCR NEGATIVE NEGATIVE Final    Comment: (NOTE) SARS-CoV-2 target nucleic acids are NOT DETECTED.  The SARS-CoV-2 RNA is generally detectable in upper and lower respiratory specimens during the acute phase of infection. The lowest concentration of SARS-CoV-2 viral copies this assay can detect is 250 copies / mL. A negative result does not preclude SARS-CoV-2 infection and should not be used as the sole basis for treatment or other patient management decisions.  A negative result may occur with improper specimen collection /  handling, submission of specimen other than nasopharyngeal swab, presence of viral mutation(s) within the areas targeted by this assay, and inadequate number of viral copies (<250 copies / mL). A negative result must be combined with clinical observations, patient history, and epidemiological information.  Fact Sheet for Patients:   RoadLapTop.co.za  Fact Sheet for Healthcare Providers: http://kim-miller.com/  This test is not yet approved or  cleared by the [place] FDA and has been authorized for detection and/or diagnosis of SARS-CoV-2 by FDA under an Emergency Use Authorization (EUA).  This EUA will remain in effect (meaning this test can be used) for the duration of the COVID-19 declaration under Section 564(b)(1) of the Act, 21 U.S.C. section 360bbb-3(b)(1), unless the authorization is terminated or revoked sooner.  Performed at [facility], [address], [place], [place] [address]   SARS Coronavirus 2 by RT PCR (hospital order, performed in [facility] hospital lab) *cepheid single result test* Anterior Nasal Swab     Status: None   Collection Time: 08/12/22 10:50 PM   Specimen: Anterior Nasal Swab  Result Value Ref Range Status   SARS Coronavirus 2 by RT PCR NEGATIVE NEGATIVE Final    Comment: (NOTE) SARS-CoV-2 target  nucleic acids are NOT DETECTED.  The SARS-CoV-2 RNA is generally detectable in upper and lower respiratory specimens during the acute phase of infection. The lowest concentration of SARS-CoV-2 viral copies this assay can detect is 250 copies / mL. A negative result does not preclude SARS-CoV-2 infection and should not be used as the sole basis for treatment or other patient management decisions.  A negative result may occur with improper specimen collection / handling, submission of specimen other than nasopharyngeal swab, presence of viral mutation(s) within the areas targeted by this assay, and inadequate number of viral copies (<250 copies / mL). A negative result must be combined with clinical observations, patient history, and epidemiological information.  Fact Sheet for Patients:   RoadLapTop.co.za  Fact Sheet for Healthcare Providers: http://kim-miller.com/  This test is not yet approved or  cleared by the [place] FDA and has been authorized for detection and/or diagnosis of SARS-CoV-2 by FDA under an Emergency Use Authorization (EUA).  This EUA will remain in effect (meaning this test can be used) for the duration of the COVID-19 declaration under Section 564(b)(1) of the Act, 21 U.S.C. section 360bbb-3(b)(1), unless the authorization is terminated or revoked sooner.  Performed at [facility], [address], [place], [place] [address]   Respiratory (~20 pathogens) panel by PCR     Status: None   Collection Time: 08/12/22 10:50 PM   Specimen: Nasopharyngeal Swab; Respiratory  Result Value Ref Range Status   Adenovirus NOT DETECTED NOT DETECTED Final   Coronavirus 229E NOT DETECTED NOT DETECTED Final    Comment: (NOTE) The Coronavirus on the Respiratory Panel, DOES NOT test for the novel  Coronavirus (2019 nCoV)    Coronavirus HKU1 NOT DETECTED NOT DETECTED Final   Coronavirus NL63 NOT DETECTED NOT DETECTED  Final   Coronavirus OC43 NOT DETECTED NOT DETECTED Final   Metapneumovirus NOT DETECTED NOT DETECTED Final   Rhinovirus / Enterovirus NOT DETECTED NOT DETECTED Final   Influenza A NOT DETECTED NOT DETECTED Final   Influenza B NOT DETECTED NOT DETECTED Final   Parainfluenza Virus 1 NOT DETECTED NOT DETECTED Final   Parainfluenza Virus 2 NOT DETECTED NOT DETECTED Final   Parainfluenza Virus 3 NOT DETECTED NOT DETECTED Final   Parainfluenza Virus 4 NOT DETECTED  NOT DETECTED Final   Respiratory Syncytial Virus NOT DETECTED NOT DETECTED Final   Bordetella pertussis NOT DETECTED NOT DETECTED Final   Bordetella Parapertussis NOT DETECTED NOT DETECTED Final   Chlamydophila pneumoniae NOT DETECTED NOT DETECTED Final   Mycoplasma pneumoniae NOT DETECTED NOT DETECTED Final    Comment: Performed at [facility], [address], [place], [place] [address]     Labs: BNP (last 3 results) Recent Labs    12/02/21 1800  BNP 15.5   Basic Metabolic Panel: Recent Labs  Lab 08/11/22 2348 08/12/22 1950 08/13/22 0220 08/13/22 0525  NA 128* 134*  --  132*  K 4.0 3.9  --  4.5  CL 93* 98  --  100  CO2 25 21*  --  23  GLUCOSE 533* 386*  --  368*  BUN 7 8  --  10  CREATININE 0.76 0.83 0.73 0.68  CALCIUM 9.0 8.9  --  8.5*   Liver Function Tests: No results for input(s): "AST", "ALT", "ALKPHOS", "BILITOT", "PROT", "ALBUMIN" in the last 168 hours. No results for input(s): "LIPASE", "AMYLASE" in the last 168 hours. No results for input(s): "AMMONIA" in the last 168 hours. CBC: Recent Labs  Lab 08/11/22 2348 08/12/22 1950 08/13/22 0220 08/13/22 0525  WBC 23.9* 18.7* 19.6* 18.6*  NEUTROABS 19.6* 17.5*  --   --   HGB 14.8 14.7 14.4 14.0  HCT 43.1 42.3 41.2 40.9  MCV 89.2 91.4 90.5 91.3  PLT 395 383 385 386   Cardiac Enzymes: No results for input(s): "CKTOTAL", "CKMB", "CKMBINDEX", "TROPONINI" in the last 168 hours. BNP: Invalid input(s): "POCBNP" CBG: Recent Labs  Lab  08/12/22 0117 08/12/22 0240 08/13/22 0350 08/13/22 0754  GLUCAP 516* 300* 318* 257*   D-Dimer Recent Labs    08/12/22 1950  DDIMER 0.29   Hgb A1c Recent Labs    08/13/22 0220  HGBA1C 10.5*   Lipid Profile No results for input(s): "CHOL", "HDL", "LDLCALC", "TRIG", "CHOLHDL", "LDLDIRECT" in the last 72 hours. Thyroid function studies No results for input(s): "TSH", "T4TOTAL", "T3FREE", "THYROIDAB" in the last 72 hours.  Invalid input(s): "FREET3" Anemia work up No results for input(s): "VITAMINB12", "FOLATE", "FERRITIN", "TIBC", "IRON", "RETICCTPCT" in the last 72 hours. Urinalysis    Component Value Date/Time   COLORURINE STRAW (A) 02/18/2021 0137   APPEARANCEUR CLEAR 02/18/2021 0137   LABSPEC 1.030 02/18/2021 0137   PHURINE 6.0 02/18/2021 0137   GLUCOSEU >=500 (A) 02/18/2021 0137   HGBUR NEGATIVE 02/18/2021 0137   HGBUR negative 12/26/2008 1435   BILIRUBINUR NEGATIVE 02/18/2021 0137   KETONESUR NEGATIVE 02/18/2021 0137   PROTEINUR NEGATIVE 02/18/2021 0137   UROBILINOGEN 0.2 01/09/2018 1114   NITRITE NEGATIVE 02/18/2021 0137   LEUKOCYTESUR NEGATIVE 02/18/2021 0137   Sepsis Labs Recent Labs  Lab 08/11/22 2348 08/12/22 1950 08/13/22 0220 08/13/22 0525  WBC 23.9* 18.7* 19.6* 18.6*   Microbiology Recent Results (from the past 240 hour(s))  SARS Coronavirus 2 by RT PCR (hospital order, performed in [facility] hospital lab) *cepheid single result test* Anterior Nasal Swab     Status: None   Collection Time: 08/04/22 12:22 AM   Specimen: Anterior Nasal Swab  Result Value Ref Range Status   SARS Coronavirus 2 by RT PCR NEGATIVE NEGATIVE Final    Comment: (NOTE) SARS-CoV-2 target nucleic acids are NOT DETECTED.  The SARS-CoV-2 RNA is generally detectable in upper and lower respiratory specimens during the acute phase of infection. The lowest concentration of SARS-CoV-2 viral copies this assay  can detect is 250 copies / mL. A negative result does not preclude  SARS-CoV-2 infection and should not be used as the sole basis for treatment or other patient management decisions.  A negative result may occur with improper specimen collection / handling, submission of specimen other than nasopharyngeal swab, presence of viral mutation(s) within the areas targeted by this assay, and inadequate number of viral copies (<250 copies / mL). A negative result must be combined with clinical observations, patient history, and epidemiological information.  Fact Sheet for Patients:   RoadLapTop.co.za  Fact Sheet for Healthcare Providers: http://kim-miller.com/  This test is not yet approved or  cleared by the [place] FDA and has been authorized for detection and/or diagnosis of SARS-CoV-2 by FDA under an Emergency Use Authorization (EUA).  This EUA will remain in effect (meaning this test can be used) for the duration of the COVID-19 declaration under Section 564(b)(1) of the Act, 21 U.S.C. section 360bbb-3(b)(1), unless the authorization is terminated or revoked sooner.  Performed at [facility], [address] [place], [place] [address]   SARS Coronavirus 2 by RT PCR (hospital order, performed in [facility] hospital lab) *cepheid single result test* Anterior Nasal Swab     Status: None   Collection Time: 08/12/22 10:50 PM   Specimen: Anterior Nasal Swab  Result Value Ref Range Status   SARS Coronavirus 2 by RT PCR NEGATIVE NEGATIVE Final    Comment: (NOTE) SARS-CoV-2 target nucleic acids are NOT DETECTED.  The SARS-CoV-2 RNA is generally detectable in upper and lower respiratory specimens during the acute phase of infection. The lowest concentration of SARS-CoV-2 viral copies this assay can detect is 250 copies / mL. A negative result does not preclude SARS-CoV-2 infection and should not be used as the sole basis for treatment or other patient management decisions.  A negative  result may occur with improper specimen collection / handling, submission of specimen other than nasopharyngeal swab, presence of viral mutation(s) within the areas targeted by this assay, and inadequate number of viral copies (<250 copies / mL). A negative result must be combined with clinical observations, patient history, and epidemiological information.  Fact Sheet for Patients:   RoadLapTop.co.za  Fact Sheet for Healthcare Providers: http://kim-miller.com/  This test is not yet approved or  cleared by the [place] FDA and has been authorized for detection and/or diagnosis of SARS-CoV-2 by FDA under an Emergency Use Authorization (EUA).  This EUA will remain in effect (meaning this test can be used) for the duration of the COVID-19 declaration under Section 564(b)(1) of the Act, 21 U.S.C. section 360bbb-3(b)(1), unless the authorization is terminated or revoked sooner.  Performed at [facility], [address] [place], [place] [address]   Respiratory (~20 pathogens) panel by PCR     Status: None   Collection Time: 08/12/22 10:50 PM   Specimen: Nasopharyngeal Swab; Respiratory  Result Value Ref Range Status   Adenovirus NOT DETECTED NOT DETECTED Final   Coronavirus 229E NOT DETECTED NOT DETECTED Final    Comment: (NOTE) The Coronavirus on the Respiratory Panel, DOES NOT test for the novel  Coronavirus (2019 nCoV)    Coronavirus HKU1 NOT DETECTED NOT DETECTED Final   Coronavirus NL63 NOT DETECTED NOT DETECTED Final   Coronavirus OC43 NOT DETECTED NOT DETECTED Final   Metapneumovirus NOT DETECTED NOT DETECTED Final   Rhinovirus / Enterovirus NOT DETECTED NOT DETECTED Final   Influenza A NOT DETECTED NOT DETECTED Final   Influenza B  NOT DETECTED NOT DETECTED Final   Parainfluenza Virus 1 NOT DETECTED NOT DETECTED Final   Parainfluenza Virus 2 NOT DETECTED NOT DETECTED Final   Parainfluenza Virus 3 NOT DETECTED NOT  DETECTED Final   Parainfluenza Virus 4 NOT DETECTED NOT DETECTED Final   Respiratory Syncytial Virus NOT DETECTED NOT DETECTED Final   Bordetella pertussis NOT DETECTED NOT DETECTED Final   Bordetella Parapertussis NOT DETECTED NOT DETECTED Final   Chlamydophila pneumoniae NOT DETECTED NOT DETECTED Final   Mycoplasma pneumoniae NOT DETECTED NOT DETECTED Final    Comment: Performed at [facility], [address], [place], [place] [address]     Patient was seen and examined on the day of discharge and was found to be in stable condition. Time coordinating discharge: 40 minutes including assessment and coordination of care, as well as examination of the patient.   SIGNED:  Noralee Stain, DO [facility] 08/13/2022, 12:56 PM

## 2022-08-13 NOTE — ED Notes (Signed)
Pt ambulatory to restroom with assistance of husband.

## 2022-08-13 NOTE — Inpatient Diabetes Management (Addendum)
Inpatient Diabetes Program Recommendations  AACE/ADA: New Consensus Statement on Inpatient Glycemic Control (2015)  Target Ranges:  Prepandial:   less than 140 mg/dL      Peak postprandial:   less than 180 mg/dL (1-2 hours)      Critically ill patients:  140 - 180 mg/dL   Lab Results  Component Value Date   GLUCAP 257 (H) 08/13/2022   HGBA1C 10.5 (H) 08/13/2022    Review of Glycemic Control Acute bronchitis/asthma exacerbation  Diabetes history: DM 2 Outpatient Diabetes medications: Synjardy 12.02-999 mg bid, Ozempic 1 mg Weekly Current orders for Inpatient glycemic control:  Novolog 0-15 units tid Semglee 10 units Daily  A1c 10.5% on 11/3 Solumedrol 80 mg Daily  Inpatient Diabetes Program Recommendations:    -  Increase Semglee to 15 units bid -  Add Novolog 4 units tid meal coverage if eating >50% of meals  Will see pt regarding A1c level and glucose control at home.   Addendum 1200 om:  Spoke with pt at bedside regarding A1c of 10.5 and glucose control at home. Pt was on steroids for 4 days prior to admission. Pt reports her glucose increased as much as 533. Pt sees Dr. Hartford Poli, Endocrinologist, outpt for Diabetes Management. Her last visit was approx 4 months ago and per her reports her A1c was 7.2%. Pt has a freestyle Libre on her left arm that she placed there yesterday 11/2. Spoke with pt about possibly needing insulin when on steroids. Told pt to contact Dr. Hartford Poli with follow up. Pt reports being on insulin in the past and has Novolog at home that has not expired. Pt knows to call Dr. Shirlyn Goltz office if placed on steroids to prevent glucose spikes. Pt has a follow up appointment with Dr. Hartford Poli scheduled for November 9th.  Thanks,  Tama Headings RN, MSN, BC-ADM Inpatient Diabetes Coordinator Team Pager [phone] (8a-5p)

## 2022-08-13 NOTE — H&P (Signed)
History and Physical    Johnnette Azzarelli [id number] DOB: [birth year] DOA: 08/12/2022  Patient coming from: Home.  Chief Complaint: Shortness of breath.  HPI: [name] is a 41 y.o. female with history of diabetes mellitus type 2, hyperlipidemia chronic back pain, depression presents to the ER with complaints of worsening shortness of breath.  Patient states she has been having wheezing and shortness of breath for the last 6 weeks and has come to the ER at least 4 times and had at least 2 courses of antibiotics and steroids.  Last visit was about 24 hours ago.  Denies any chest pain.  Denies any recent travel.  Patient's husband was sick over the last 2 weeks with similar symptoms.  ED Course: In the ER patient was found to be wheezing chest x-ray not showing the acute Labs show leukocytosis troponins are negative.  Patient admitted for further observation.  Blood sugar has been elevated with a hemoglobin A1c checked today was 10.5.  Review of Systems: As per HPI, rest all negative.   Past Medical History:  Diagnosis Date   Anxiety    Asthma    Back pain    CAP (community acquired pneumonia)    Chronic pain syndrome    Depression    Diabetes mellitus without complication ([place])    Gallbladder problem    Obsessive compulsive disorder     Past Surgical History:  Procedure Laterality Date   CESAREAN SECTION     CHOLECYSTECTOMY  2009   wisdom teeth extraction       reports that she has been smoking cigarettes. She has a 11.50 pack-year smoking history. She has never used smokeless tobacco. She reports that she does not currently use alcohol. She reports that she does not use drugs.  Allergies  Allergen Reactions   Orange Oil Shortness Of Breath   Penicillins Itching and Rash   Carisoprodol Other (See Comments)    Skin peeled    Piroxicam Other (See Comments)    Skin peeled    Family History  Problem Relation Age of Onset   Diabetes insipidus  Father    Arthritis Father    Heart failure Father    COPD Father    Emphysema Father    Asthma Son     Prior to Admission medications   Medication Sig Start Date End Date Taking? Authorizing Provider  acetaminophen (TYLENOL) 500 MG tablet Take 1 tablet (500 mg total) by mouth every 6 (six) hours as needed. Patient taking differently: Take 500 mg by mouth every 6 (six) hours as needed for moderate pain. 04/26/22  Yes Redwine, Madison A, PA-C  albuterol (VENTOLIN HFA) 108 (90 Base) MCG/ACT inhaler Inhale 1-2 puffs into the lungs every 6 (six) hours as needed for wheezing or shortness of breath. 12/02/21  Yes Cobb, Karie Schwalbe, NP  amphetamine-dextroamphetamine (ADDERALL) 30 MG tablet Take 30 mg by mouth daily. 09/28/20  Yes [provider]  ARIPiprazole (ABILIFY) 10 MG tablet Take 10 mg by mouth every morning. 01/21/21  Yes [provider]  aspirin EC 81 MG tablet Take 81 mg by mouth daily. Swallow whole.   Yes [provider]  benzonatate (TESSALON) 100 MG capsule Take 1 capsule (100 mg total) by mouth every 8 (eight) hours. 08/04/22  Yes Maudie Flakes, MD  Empagliflozin-metFORMIN HCl (SYNJARDY) 12.02-999 MG TABS Take 1 tablet by mouth 2 (two) times daily. 02/12/21  Yes [provider]  ezetimibe (ZETIA) 10 MG tablet Take  1 tablet (10 mg total) by mouth daily. 09/26/17  Yes Scot Jun, FNP  gabapentin (NEURONTIN) 100 MG capsule Take 1 capsule (100 mg total) by mouth daily after breakfast. Patient taking differently: Take 100 mg by mouth every morning. 02/14/20  Yes Jessy Oto, MD  gabapentin (NEURONTIN) 300 MG capsule Take 1-2 capsules (300-600 mg total) by mouth at bedtime. Only take as prescribed.  Husband is to administer medication Patient taking differently: Take 600 mg by mouth at bedtime. 02/14/20  Yes Jessy Oto, MD  lisinopril (PRINIVIL,ZESTRIL) 2.5 MG tablet Take 1 tablet (2.5 mg total) by mouth daily. 05/10/17  Yes Scot Jun, FNP   Multiple Vitamin (MULTIVITAMIN WITH MINERALS) TABS tablet Take 1 tablet by mouth every morning.   Yes [provider]  Omega-3 Fatty Acids (FISH OIL PO) Take 1 capsule by mouth every morning.   Yes [provider]  pravastatin (PRAVACHOL) 80 MG tablet Take 1 tablet (80 mg total) by mouth daily. Patient taking differently: Take 80 mg by mouth every morning. 05/11/17  Yes Scot Jun, FNP  Semaglutide, 1 MG/DOSE, (OZEMPIC, 1 MG/DOSE,) 4 MG/3ML SOPN Inject 1 mg into the skin every Wednesday. 02/12/21  Yes [provider]  sertraline (ZOLOFT) 100 MG tablet Take 2 tablets (200 mg total) by mouth daily. For depression and anxiety. 06/13/13  Yes Mashburn, Marlane Hatcher, PA-C  traZODone (DESYREL) 100 MG tablet Take 200 mg by mouth at bedtime. 01/21/21  Yes [provider]  BD PEN NEEDLE NANO 2ND GEN 32G X 4 MM MISC daily. 09/23/20   [provider]  cyclobenzaprine (FLEXERIL) 10 MG tablet Take 1 tablet (10 mg total) by mouth 2 (two) times daily as needed for muscle spasms. Patient not taking: Reported on 08/12/2022 04/08/22   Gareth Morgan, MD  doxycycline (VIBRAMYCIN) 100 MG capsule Take 1 capsule (100 mg total) by mouth 2 (two) times daily. One po bid x 7 days Patient not taking: Reported on 08/12/2022 07/29/22   Deno Etienne, DO  ibuprofen (ADVIL) 600 MG tablet Take 1 tablet (600 mg total) by mouth every 6 (six) hours as needed. Patient not taking: Reported on 08/12/2022 04/26/22   Redwine, Cecilio Asper, PA-C    Physical Exam: Constitutional: Moderately built and nourished. Vitals:   08/13/22 0030 08/13/22 0130 08/13/22 0200 08/13/22 0215  BP:  104/69 124/82 115/83  Pulse:  (!) 105 (!) 102 (!) 103  Resp:  18 (!) 21 19  Temp: 98.2 F (36.8 C)     TempSrc:      SpO2:  91% 95% 93%  Weight:      Height:       Eyes: Anicteric no pallor. ENMT: No discharge from the ears eyes nose and mouth. Neck: No mass felt.  No neck rigidity. Respiratory: Bilateral expiratory  wheeze and no crepitations. Cardiovascular: S1-S2 heard. Abdomen: Soft nontender bowel sound present. Musculoskeletal: No edema. Skin: No rash. Neurologic: Alert awake oriented to time place and person.  Moves all extremities. Psychiatric: Appears normal.  Normal affect.   Labs on Admission: I have personally reviewed following labs and imaging studies  CBC: Recent Labs  Lab 08/11/22 2348 08/12/22 1950  WBC 23.9* 18.7*  NEUTROABS 19.6* 17.5*  HGB 14.8 14.7  HCT 43.1 42.3  MCV 89.2 91.4  PLT 395 [id number]   Basic Metabolic Panel: Recent Labs  Lab 08/11/22 2348 08/12/22 1950  NA 128* 134*  K 4.0 3.9  CL 93* 98  CO2 25 21*  GLUCOSE 533* 386*  BUN 7 8  CREATININE 0.76 0.83  CALCIUM 9.0 8.9   GFR: Estimated Creatinine Clearance: 103.8 mL/min (by C-G formula based on SCr of 0.83 mg/dL). Liver Function Tests: No results for input(s): "AST", "ALT", "ALKPHOS", "BILITOT", "PROT", "ALBUMIN" in the last 168 hours. No results for input(s): "LIPASE", "AMYLASE" in the last 168 hours. No results for input(s): "AMMONIA" in the last 168 hours. Coagulation Profile: No results for input(s): "INR", "PROTIME" in the last 168 hours. Cardiac Enzymes: No results for input(s): "CKTOTAL", "CKMB", "CKMBINDEX", "TROPONINI" in the last 168 hours. BNP (last 3 results) No results for input(s): "PROBNP" in the last 8760 hours. HbA1C: No results for input(s): "HGBA1C" in the last 72 hours. CBG: Recent Labs  Lab 08/12/22 0117 08/12/22 0240  GLUCAP 516* 300*   Lipid Profile: No results for input(s): "CHOL", "HDL", "LDLCALC", "TRIG", "CHOLHDL", "LDLDIRECT" in the last 72 hours. Thyroid Function Tests: No results for input(s): "TSH", "T4TOTAL", "FREET4", "T3FREE", "THYROIDAB" in the last 72 hours. Anemia Panel: No results for input(s): "VITAMINB12", "FOLATE", "FERRITIN", "TIBC", "IRON", "RETICCTPCT" in the last 72 hours. Urine analysis:    Component Value Date/Time   COLORURINE STRAW (A)  02/18/2021 0137   APPEARANCEUR CLEAR 02/18/2021 0137   LABSPEC 1.030 02/18/2021 0137   PHURINE 6.0 02/18/2021 0137   GLUCOSEU >=500 (A) 02/18/2021 0137   HGBUR NEGATIVE 02/18/2021 0137   HGBUR negative 12/26/2008 1435   BILIRUBINUR NEGATIVE 02/18/2021 0137   KETONESUR NEGATIVE 02/18/2021 0137   PROTEINUR NEGATIVE 02/18/2021 0137   UROBILINOGEN 0.2 01/09/2018 1114   NITRITE NEGATIVE 02/18/2021 0137   LEUKOCYTESUR NEGATIVE 02/18/2021 0137   Sepsis Labs: @LABRCNTIP (procalcitonin:4,lacticidven:4) ) Recent Results (from the past 240 hour(s))  SARS Coronavirus 2 by RT PCR (hospital order, performed in [facility] hospital lab) *cepheid single result test* Anterior Nasal Swab     Status: None   Collection Time: 08/04/22 12:22 AM   Specimen: Anterior Nasal Swab  Result Value Ref Range Status   SARS Coronavirus 2 by RT PCR NEGATIVE NEGATIVE Final    Comment: (NOTE) SARS-CoV-2 target nucleic acids are NOT DETECTED.  The SARS-CoV-2 RNA is generally detectable in upper and lower respiratory specimens during the acute phase of infection. The lowest concentration of SARS-CoV-2 viral copies this assay can detect is 250 copies / mL. A negative result does not preclude SARS-CoV-2 infection and should not be used as the sole basis for treatment or other patient management decisions.  A negative result may occur with improper specimen collection / handling, submission of specimen other than nasopharyngeal swab, presence of viral mutation(s) within the areas targeted by this assay, and inadequate number of viral copies (<250 copies / mL). A negative result must be combined with clinical observations, patient history, and epidemiological information.  Fact Sheet for Patients:   https://www.patel.info/  Fact Sheet for Healthcare Providers: https://hall.com/  This test is not yet approved or  cleared by the [place] FDA and has been authorized for  detection and/or diagnosis of SARS-CoV-2 by FDA under an Emergency Use Authorization (EUA).  This EUA will remain in effect (meaning this test can be used) for the duration of the COVID-19 declaration under Section 564(b)(1) of the Act, 21 U.S.C. section 360bbb-3(b)(1), unless the authorization is terminated or revoked sooner.  Performed at [facility], [address] [place], [place] [address]   SARS Coronavirus 2 by RT PCR (hospital order, performed in [facility] hospital lab) *cepheid single result test* Anterior Nasal Swab  Status: None   Collection Time: 08/12/22 10:50 PM   Specimen: Anterior Nasal Swab  Result Value Ref Range Status   SARS Coronavirus 2 by RT PCR NEGATIVE NEGATIVE Final    Comment: (NOTE) SARS-CoV-2 target nucleic acids are NOT DETECTED.  The SARS-CoV-2 RNA is generally detectable in upper and lower respiratory specimens during the acute phase of infection. The lowest concentration of SARS-CoV-2 viral copies this assay can detect is 250 copies / mL. A negative result does not preclude SARS-CoV-2 infection and should not be used as the sole basis for treatment or other patient management decisions.  A negative result may occur with improper specimen collection / handling, submission of specimen other than nasopharyngeal swab, presence of viral mutation(s) within the areas targeted by this assay, and inadequate number of viral copies (<250 copies / mL). A negative result must be combined with clinical observations, patient history, and epidemiological information.  Fact Sheet for Patients:   https://www.patel.info/  Fact Sheet for Healthcare Providers: https://hall.com/  This test is not yet approved or  cleared by the [place] FDA and has been authorized for detection and/or diagnosis of SARS-CoV-2 by FDA under an Emergency Use Authorization (EUA).  This EUA will remain in effect  (meaning this test can be used) for the duration of the COVID-19 declaration under Section 564(b)(1) of the Act, 21 U.S.C. section 360bbb-3(b)(1), unless the authorization is terminated or revoked sooner.  Performed at [facility], [place] [address], [place], [place] [address]      Radiological Exams on Admission: DG CHEST PORT 1 VIEW  Result Date: 08/13/2022 CLINICAL DATA:  Shortness of breath. EXAM: PORTABLE CHEST 1 VIEW COMPARISON:  Chest radiograph dated 08/11/2022. FINDINGS: The heart size and mediastinal contours are within normal limits. Both lungs are clear. The visualized skeletal structures are unremarkable. IMPRESSION: No active disease. Electronically Signed   By: Anner Crete M.D.   On: 08/13/2022 00:47   DG Chest 2 View  Result Date: 08/11/2022 CLINICAL DATA:  Dyspnea EXAM: CHEST - 2 VIEW COMPARISON:  08/04/2022 FINDINGS: The heart size and mediastinal contours are within normal limits. Both lungs are clear. The visualized skeletal structures are unremarkable. IMPRESSION: No active cardiopulmonary disease. Electronically Signed   By: Fidela Salisbury M.D.   On: 08/11/2022 23:35    Assessment/Plan Principal Problem:   Asthma exacerbation Active Problems:   HYPERCHOLESTEROLEMIA   TOBACCO ABUSE   Type 2 diabetes mellitus with hyperglycemia (HCC)   Acute bronchitis    Acute bronchitis/asthma exacerbation -we will keep patient on steroids nebulizer Pulmicort and antibiotics.  Check D-dimer. Diabetes mellitus type 2 poorly controlled with hyperglycemia hemoglobin A1c this admission is 10.5.  Started patient on Lantus and keeping patient on resistant sliding scale.  Closely monitor CBGs.  Follow metabolic panel. Leukocytosis likely from recent steroid use and also respiratory infection.  Follow CBC. Tobacco abuse strongly advised about quitting. Hyperlipidemia on statins. Chronic back pain is followed by Dr. Louanne Skye as per the patient and eventually surgery has been  planned.  Continue gabapentin. Depression on Abilify and Zoloft.   DVT prophylaxis: Lovenox. Code Status: Full code. Family Communication: Discussed with patient. Disposition Plan: Home when stable. Consults called: None. Admission status: Observation.

## 2022-08-28 ENCOUNTER — Emergency Department (HOSPITAL_BASED_OUTPATIENT_CLINIC_OR_DEPARTMENT_OTHER)
Admission: EM | Admit: 2022-08-28 | Discharge: 2022-08-28 | Discharge status: Against Medical Advice | Payer: BC Managed Care – PPO | Attending: Emergency Medicine | Admitting: Emergency Medicine

## 2022-08-28 ENCOUNTER — Other Ambulatory Visit: Payer: Self-pay

## 2022-08-28 ENCOUNTER — Encounter (HOSPITAL_BASED_OUTPATIENT_CLINIC_OR_DEPARTMENT_OTHER): Payer: Self-pay

## 2022-08-28 DIAGNOSIS — M546 Pain in thoracic spine: Secondary | ICD-10-CM | POA: Diagnosis present

## 2022-08-28 DIAGNOSIS — Z5321 Procedure and treatment not carried out due to patient leaving prior to being seen by health care provider: Secondary | ICD-10-CM | POA: Insufficient documentation

## 2022-08-28 NOTE — ED Triage Notes (Signed)
POV ambulatory to triage. C/o back pain x 3 days in midback that radiates to both sides. 3pm took 15 mg oxycodone without relief.

## 2022-11-14 ENCOUNTER — Encounter (HOSPITAL_BASED_OUTPATIENT_CLINIC_OR_DEPARTMENT_OTHER): Payer: Self-pay | Admitting: Emergency Medicine

## 2022-11-14 ENCOUNTER — Emergency Department (HOSPITAL_BASED_OUTPATIENT_CLINIC_OR_DEPARTMENT_OTHER)
Admission: EM | Admit: 2022-11-14 | Discharge: 2022-11-14 | Disposition: A | Discharge status: Home / Self Care | Payer: BC Managed Care – PPO | Attending: Emergency Medicine | Admitting: Emergency Medicine

## 2022-11-14 ENCOUNTER — Other Ambulatory Visit: Payer: Self-pay

## 2022-11-14 ENCOUNTER — Emergency Department (HOSPITAL_BASED_OUTPATIENT_CLINIC_OR_DEPARTMENT_OTHER): Payer: BC Managed Care – PPO | Admitting: Radiology

## 2022-11-14 DIAGNOSIS — Z794 Long term (current) use of insulin: Secondary | ICD-10-CM | POA: Insufficient documentation

## 2022-11-14 DIAGNOSIS — E119 Type 2 diabetes mellitus without complications: Secondary | ICD-10-CM | POA: Insufficient documentation

## 2022-11-14 DIAGNOSIS — J4541 Moderate persistent asthma with (acute) exacerbation: Secondary | ICD-10-CM | POA: Diagnosis not present

## 2022-11-14 DIAGNOSIS — Z20822 Contact with and (suspected) exposure to covid-19: Secondary | ICD-10-CM | POA: Diagnosis not present

## 2022-11-14 DIAGNOSIS — Z7984 Long term (current) use of oral hypoglycemic drugs: Secondary | ICD-10-CM | POA: Insufficient documentation

## 2022-11-14 DIAGNOSIS — R059 Cough, unspecified: Secondary | ICD-10-CM | POA: Diagnosis present

## 2022-11-14 DIAGNOSIS — Z7951 Long term (current) use of inhaled steroids: Secondary | ICD-10-CM | POA: Diagnosis not present

## 2022-11-14 DIAGNOSIS — J069 Acute upper respiratory infection, unspecified: Secondary | ICD-10-CM | POA: Insufficient documentation

## 2022-11-14 DIAGNOSIS — Z7982 Long term (current) use of aspirin: Secondary | ICD-10-CM | POA: Insufficient documentation

## 2022-11-14 DIAGNOSIS — Z7952 Long term (current) use of systemic steroids: Secondary | ICD-10-CM | POA: Diagnosis not present

## 2022-11-14 DIAGNOSIS — J45901 Unspecified asthma with (acute) exacerbation: Secondary | ICD-10-CM

## 2022-11-14 LAB — RESP PANEL BY RT-PCR (RSV, FLU A&B, COVID)  RVPGX2
Influenza A by PCR: NEGATIVE
Influenza B by PCR: NEGATIVE
Resp Syncytial Virus by PCR: NEGATIVE
SARS Coronavirus 2 by RT PCR: NEGATIVE

## 2022-11-14 MED ORDER — ALBUTEROL SULFATE (2.5 MG/3ML) 0.083% IN NEBU
2.5000 mg | INHALATION_SOLUTION | Freq: Once | RESPIRATORY_TRACT | Status: AC
  Administered 2022-11-14: 2.5 mg via RESPIRATORY_TRACT
  Filled 2022-11-14: qty 3

## 2022-11-14 MED ORDER — ALBUTEROL SULFATE HFA 108 (90 BASE) MCG/ACT IN AERS
2.0000 | INHALATION_SPRAY | Freq: Four times a day (QID) | RESPIRATORY_TRACT | Status: DC | PRN
  Administered 2022-11-14: 2 via RESPIRATORY_TRACT
  Filled 2022-11-14: qty 6.7

## 2022-11-14 MED ORDER — AEROCHAMBER PLUS FLO-VU LARGE MISC
1.0000 | Freq: Once | Status: AC
  Administered 2022-11-14: 1
  Filled 2022-11-14: qty 1

## 2022-11-14 MED ORDER — PREDNISONE 10 MG PO TABS: 40.0000 mg | ORAL_TABLET | Freq: Every day | ORAL | 0 refills | Status: DC

## 2022-11-14 MED ORDER — IPRATROPIUM-ALBUTEROL 0.5-2.5 (3) MG/3ML IN SOLN
3.0000 mL | Freq: Once | RESPIRATORY_TRACT | Status: AC
  Administered 2022-11-14: 3 mL via RESPIRATORY_TRACT
  Filled 2022-11-14: qty 3

## 2022-11-14 MED ORDER — PREDNISONE 50 MG PO TABS
60.0000 mg | ORAL_TABLET | Freq: Once | ORAL | Status: AC
  Administered 2022-11-14: 60 mg via ORAL
  Filled 2022-11-14: qty 1

## 2022-11-14 NOTE — ED Triage Notes (Signed)
Patient c/o possible pneumonia.  Patient reports she has a history of pneumonia.  Patient endorses shob, non productive cough, and is unsure about fevers.  Patient reports this started yesterday morning.

## 2022-11-14 NOTE — ED Provider Notes (Signed)
[facility] Provider Note   CSN: [id number] Arrival date & time: 11/14/22  2051     History  Chief Complaint  Patient presents with   Cough   Shortness of Breath    [name] is a 42 y.o. female.  Patient with a complaint of asthma exacerbation.  Also may have a little bit of an upper respiratory infection.  Patient with some wheezing upon arrival patient given nebulizer treatments here.  Patient ran out of her albuterol inhaler at home.  Patient concerned about possible pneumonia.  Had a history of pneumonia.  She has had a little bit of a productive cough no fevers.  Symptoms just started yesterday no sore throat not a lot of congestion.  Temp here was 98.2.  Oxygen sats 98%.  Patient currently not on prednisone.  Past medical history significant for excessive compulsive disorder chronic pain syndrome.  History of commune acquired pneumonia his diabetes history of asthma.  Past surgical history seen for gallbladder removal.  Patient is a current smoker.       Home Medications Prior to Admission medications   Medication Sig Start Date End Date Taking? Authorizing Provider  predniSONE (DELTASONE) 10 MG tablet Take 4 tablets (40 mg total) by mouth daily. 11/14/22  Yes Fredia Sorrow, MD  acetaminophen (TYLENOL) 500 MG tablet Take 1 tablet (500 mg total) by mouth every 6 (six) hours as needed. Patient taking differently: Take 500 mg by mouth every 6 (six) hours as needed for moderate pain. 04/26/22   Redwine, Madison A, PA-C  albuterol (VENTOLIN HFA) 108 (90 Base) MCG/ACT inhaler Inhale 1-2 puffs into the lungs every 6 (six) hours as needed for wheezing or shortness of breath. 08/13/22   Dessa Phi, DO  amphetamine-dextroamphetamine (ADDERALL) 30 MG tablet Take 30 mg by mouth daily. 09/28/20   [provider]  ARIPiprazole (ABILIFY) 10 MG tablet Take 10 mg by mouth every morning. 01/21/21   [provider]   aspirin EC 81 MG tablet Take 81 mg by mouth daily. Swallow whole.    [provider]  BD PEN NEEDLE NANO 2ND GEN 32G X 4 MM MISC daily. 09/23/20   [provider]  benzonatate (TESSALON) 100 MG capsule Take 1 capsule (100 mg total) by mouth every 8 (eight) hours. 08/04/22   Maudie Flakes, MD  Empagliflozin-metFORMIN HCl (SYNJARDY) 12.02-999 MG TABS Take 1 tablet by mouth 2 (two) times daily. 02/12/21   [provider]  ezetimibe (ZETIA) 10 MG tablet Take 1 tablet (10 mg total) by mouth daily. 09/26/17   Scot Jun, NP  Fluticasone Furoate-Vilanterol (BREO ELLIPTA) 50-25 MCG/ACT AEPB Inhale 1 puff into the lungs daily in the afternoon. 08/13/22   Dessa Phi, DO  gabapentin (NEURONTIN) 100 MG capsule Take 1 capsule (100 mg total) by mouth daily after breakfast. Patient taking differently: Take 100 mg by mouth every morning. 02/14/20   Jessy Oto, MD  gabapentin (NEURONTIN) 300 MG capsule Take 1-2 capsules (300-600 mg total) by mouth at bedtime. Only take as prescribed.  Husband is to administer medication Patient taking differently: Take 600 mg by mouth at bedtime. 02/14/20   Jessy Oto, MD  lisinopril (PRINIVIL,ZESTRIL) 2.5 MG tablet Take 1 tablet (2.5 mg total) by mouth daily. 05/10/17   Scot Jun, NP  Multiple Vitamin (MULTIVITAMIN WITH MINERALS) TABS tablet Take 1 tablet by mouth every morning.    [provider]  Omega-3 Fatty Acids ([place]  OIL PO) Take 1 capsule by mouth every morning.    [provider]  pravastatin (PRAVACHOL) 80 MG tablet Take 1 tablet (80 mg total) by mouth daily. Patient taking differently: Take 80 mg by mouth every morning. 05/11/17   Scot Jun, NP  predniSONE (DELTASONE) 10 MG tablet Take 4 tabs for 3 days, then 3 tabs for 3 days, then 2 tabs for 3 days, then 1 tab for 3 days, then 1/2 tab for 4 days. 08/13/22   Dessa Phi, DO  Semaglutide, 1 MG/DOSE, (OZEMPIC, 1 MG/DOSE,) 4 MG/3ML SOPN Inject 1  mg into the skin every Wednesday. 02/12/21   [provider]  sertraline (ZOLOFT) 100 MG tablet Take 2 tablets (200 mg total) by mouth daily. For depression and anxiety. 06/13/13   Ruben Im, PA-C  traZODone (DESYREL) 100 MG tablet Take 200 mg by mouth at bedtime. 01/21/21   [provider]      Allergies    Orange oil, Penicillins, Carisoprodol, and Piroxicam    Review of Systems   Review of Systems  Constitutional:  Negative for chills and fever.  HENT:  Positive for congestion. Negative for ear pain and sore throat.   Eyes:  Negative for pain and visual disturbance.  Respiratory:  Positive for cough, shortness of breath and wheezing.   Cardiovascular:  Negative for chest pain and palpitations.  Gastrointestinal:  Negative for abdominal pain and vomiting.  Genitourinary:  Negative for dysuria and hematuria.  Musculoskeletal:  Negative for arthralgias and back pain.  Skin:  Negative for color change and rash.  Neurological:  Negative for seizures and syncope.  All other systems reviewed and are negative.   Physical Exam Updated Vital Signs BP 106/65   Pulse (!) 117   Temp 98.2 F (36.8 C)   Resp 16   Ht 1.676 m (5\' 6" )   Wt 86.2 kg   LMP 11/08/2022 (Approximate)   SpO2 96%   BMI 30.67 kg/m  Physical Exam Vitals and nursing note reviewed.  Constitutional:      General: She is not in acute distress.    Appearance: Normal appearance. She is well-developed.  HENT:     Head: Normocephalic and atraumatic.     Mouth/Throat:     Mouth: Mucous membranes are moist.     Pharynx: Oropharynx is clear.  Eyes:     Extraocular Movements: Extraocular movements intact.     Conjunctiva/sclera: Conjunctivae normal.     Pupils: Pupils are equal, round, and reactive to light.  Cardiovascular:     Rate and Rhythm: Normal rate and regular rhythm.     Heart sounds: No murmur heard. Pulmonary:     Effort: Pulmonary effort is normal. No respiratory distress.      Breath sounds: No stridor. Wheezing and rhonchi present. No rales.  Chest:     Chest wall: No tenderness.  Abdominal:     Palpations: Abdomen is soft.     Tenderness: There is no abdominal tenderness.  Musculoskeletal:        General: No swelling.     Cervical back: Neck supple.     Right lower leg: No edema.     Left lower leg: No edema.  Skin:    General: Skin is warm and dry.     Capillary Refill: Capillary refill takes less than 2 seconds.  Neurological:     General: No focal deficit present.     Mental Status: She is alert and oriented to person,  place, and time.  Psychiatric:        Mood and Affect: Mood normal.     ED Results / Procedures / Treatments   Labs (all labs ordered are listed, but only abnormal results are displayed) Labs Reviewed  RESP PANEL BY RT-PCR (RSV, FLU A&B, COVID)  [id number]    EKG None  Radiology DG Chest 2 View  Result Date: 11/14/2022 CLINICAL DATA:  Shortness of breath cough EXAM: CHEST - 2 VIEW COMPARISON:  Chest radiograph dated August 13, 2022 FINDINGS: The heart size and mediastinal contours are within normal limits. Both lungs are clear. The visualized skeletal structures are unremarkable. IMPRESSION: No active cardiopulmonary disease. Electronically Signed   By: Keane Police D.O.   On: 11/14/2022 21:51    Procedures Procedures    Medications Ordered in ED Medications  predniSONE (DELTASONE) tablet 60 mg (has no administration in time range)  albuterol (VENTOLIN HFA) 108 (90 Base) MCG/ACT inhaler 2 puff (has no administration in time range)  ipratropium-albuterol (DUONEB) 0.5-2.5 (3) MG/3ML nebulizer solution 3 mL (3 mLs Nebulization Given 11/14/22 2129)  albuterol (PROVENTIL) (2.5 MG/3ML) 0.083% nebulizer solution 2.5 mg (2.5 mg Nebulization Given 11/14/22 2129)  ipratropium-albuterol (DUONEB) 0.5-2.5 (3) MG/3ML nebulizer solution 3 mL (3 mLs Nebulization Given 11/14/22 2208)  albuterol (PROVENTIL) (2.5 MG/3ML) 0.083% nebulizer solution 2.5  mg (2.5 mg Nebulization Given 11/14/22 2208)    ED Course/ Medical Decision Making/ A&P                             Medical Decision Making Amount and/or Complexity of Data Reviewed Radiology: ordered.  Risk Prescription drug management.   Patient still with some slight wheezing.  Already received a breathing treatment prior to me seeing her.  Will give her 60 mg of prednisone here.  Will renew her albuterol inhaler to go.  Patient feels wheezing is improved enough that she can be discharged.  X-ray negative for pneumonia respiratory panel negative for COVID flu and RSV.  Patient will return for any new or worse symptoms Final Clinical Impression(s) / ED Diagnoses Final diagnoses:  Upper respiratory tract infection, unspecified type  Moderate asthma with acute exacerbation, unspecified whether persistent    Rx / DC Orders ED Discharge Orders          Ordered    predniSONE (DELTASONE) 10 MG tablet  Daily        11/14/22 2327              Fredia Sorrow, MD 11/14/22 2332

## 2022-11-14 NOTE — ED Notes (Signed)
RT educated the pt on the importance of smoking cessation. Pt states she is trying to quit and is down to 1/2 a pk per day. RT educated pt on how her smoking continues to effect her Asthma and asthma symptoms. Pt respiratory status stable on RA w/no distress noted at this time. Pt verbalizes understanding.

## 2022-11-14 NOTE — Discharge Instructions (Addendum)
Use the albuterol inhaler 2 puffs every 6 hours at least for the next 7 days.  Take the prednisone as directed.  Chest x-ray today negative COVID flu and RSV testing negative.  Seems to be an exacerbation of your asthma.  Return for any new or worse symptoms.  Make an appointment follow-up with your doctor.

## 2022-11-14 NOTE — ED Notes (Signed)
PA at bedside.

## 2022-11-14 NOTE — ED Notes (Signed)
RT educated pt on proper use of MDI w/spacer. Pt able to perform without difficulty. Pt verbalizes understanding of all teaching.

## 2022-11-30 ENCOUNTER — Emergency Department (HOSPITAL_BASED_OUTPATIENT_CLINIC_OR_DEPARTMENT_OTHER): Payer: BC Managed Care – PPO | Admitting: Radiology

## 2022-11-30 ENCOUNTER — Inpatient Hospital Stay (HOSPITAL_BASED_OUTPATIENT_CLINIC_OR_DEPARTMENT_OTHER)
Admission: EM | Admit: 2022-11-30 | Discharge: 2022-12-02 | DRG: 202 | Disposition: A | Discharge status: Home / Self Care | Payer: BC Managed Care – PPO | Attending: Internal Medicine | Admitting: Internal Medicine

## 2022-11-30 ENCOUNTER — Encounter (HOSPITAL_BASED_OUTPATIENT_CLINIC_OR_DEPARTMENT_OTHER): Payer: Self-pay | Admitting: Emergency Medicine

## 2022-11-30 ENCOUNTER — Emergency Department (HOSPITAL_BASED_OUTPATIENT_CLINIC_OR_DEPARTMENT_OTHER): Payer: BC Managed Care – PPO

## 2022-11-30 ENCOUNTER — Other Ambulatory Visit: Payer: Self-pay

## 2022-11-30 DIAGNOSIS — Z880 Allergy status to penicillin: Secondary | ICD-10-CM | POA: Diagnosis not present

## 2022-11-30 DIAGNOSIS — G894 Chronic pain syndrome: Secondary | ICD-10-CM | POA: Diagnosis not present

## 2022-11-30 DIAGNOSIS — F32A Depression, unspecified: Secondary | ICD-10-CM | POA: Diagnosis present

## 2022-11-30 DIAGNOSIS — J9601 Acute respiratory failure with hypoxia: Secondary | ICD-10-CM | POA: Diagnosis not present

## 2022-11-30 DIAGNOSIS — R0600 Dyspnea, unspecified: Principal | ICD-10-CM

## 2022-11-30 DIAGNOSIS — Z7982 Long term (current) use of aspirin: Secondary | ICD-10-CM | POA: Diagnosis not present

## 2022-11-30 DIAGNOSIS — E871 Hypo-osmolality and hyponatremia: Secondary | ICD-10-CM | POA: Diagnosis present

## 2022-11-30 DIAGNOSIS — Z79899 Other long term (current) drug therapy: Secondary | ICD-10-CM

## 2022-11-30 DIAGNOSIS — Z7951 Long term (current) use of inhaled steroids: Secondary | ICD-10-CM

## 2022-11-30 DIAGNOSIS — E1140 Type 2 diabetes mellitus with diabetic neuropathy, unspecified: Secondary | ICD-10-CM

## 2022-11-30 DIAGNOSIS — E7800 Pure hypercholesterolemia, unspecified: Secondary | ICD-10-CM | POA: Diagnosis not present

## 2022-11-30 DIAGNOSIS — Z8261 Family history of arthritis: Secondary | ICD-10-CM

## 2022-11-30 DIAGNOSIS — J4541 Moderate persistent asthma with (acute) exacerbation: Principal | ICD-10-CM | POA: Diagnosis present

## 2022-11-30 DIAGNOSIS — I10 Essential (primary) hypertension: Secondary | ICD-10-CM | POA: Diagnosis not present

## 2022-11-30 DIAGNOSIS — F419 Anxiety disorder, unspecified: Secondary | ICD-10-CM | POA: Diagnosis present

## 2022-11-30 DIAGNOSIS — T380X5A Adverse effect of glucocorticoids and synthetic analogues, initial encounter: Secondary | ICD-10-CM | POA: Diagnosis present

## 2022-11-30 DIAGNOSIS — F429 Obsessive-compulsive disorder, unspecified: Secondary | ICD-10-CM | POA: Diagnosis present

## 2022-11-30 DIAGNOSIS — J45901 Unspecified asthma with (acute) exacerbation: Secondary | ICD-10-CM | POA: Diagnosis present

## 2022-11-30 DIAGNOSIS — Z825 Family history of asthma and other chronic lower respiratory diseases: Secondary | ICD-10-CM | POA: Diagnosis not present

## 2022-11-30 DIAGNOSIS — Z7984 Long term (current) use of oral hypoglycemic drugs: Secondary | ICD-10-CM | POA: Diagnosis not present

## 2022-11-30 DIAGNOSIS — E878 Other disorders of electrolyte and fluid balance, not elsewhere classified: Secondary | ICD-10-CM | POA: Diagnosis not present

## 2022-11-30 DIAGNOSIS — Z888 Allergy status to other drugs, medicaments and biological substances status: Secondary | ICD-10-CM | POA: Diagnosis not present

## 2022-11-30 DIAGNOSIS — E1142 Type 2 diabetes mellitus with diabetic polyneuropathy: Secondary | ICD-10-CM | POA: Diagnosis not present

## 2022-11-30 DIAGNOSIS — E785 Hyperlipidemia, unspecified: Secondary | ICD-10-CM

## 2022-11-30 DIAGNOSIS — Z720 Tobacco use: Secondary | ICD-10-CM

## 2022-11-30 DIAGNOSIS — J209 Acute bronchitis, unspecified: Secondary | ICD-10-CM | POA: Diagnosis not present

## 2022-11-30 DIAGNOSIS — F17210 Nicotine dependence, cigarettes, uncomplicated: Secondary | ICD-10-CM | POA: Diagnosis present

## 2022-11-30 DIAGNOSIS — Z9109 Other allergy status, other than to drugs and biological substances: Secondary | ICD-10-CM

## 2022-11-30 DIAGNOSIS — Z1152 Encounter for screening for COVID-19: Secondary | ICD-10-CM

## 2022-11-30 DIAGNOSIS — Z8249 Family history of ischemic heart disease and other diseases of the circulatory system: Secondary | ICD-10-CM | POA: Diagnosis not present

## 2022-11-30 DIAGNOSIS — E1165 Type 2 diabetes mellitus with hyperglycemia: Secondary | ICD-10-CM | POA: Diagnosis not present

## 2022-11-30 DIAGNOSIS — Z794 Long term (current) use of insulin: Secondary | ICD-10-CM

## 2022-11-30 DIAGNOSIS — J4540 Moderate persistent asthma, uncomplicated: Secondary | ICD-10-CM

## 2022-11-30 LAB — CBC
HCT: 41.2 % (ref 36.0–46.0)
Hemoglobin: 13.9 g/dL (ref 12.0–15.0)
MCH: 30.3 pg (ref 26.0–34.0)
MCHC: 33.7 g/dL (ref 30.0–36.0)
MCV: 90 fL (ref 80.0–100.0)
Platelets: 306 10*3/uL (ref 150–400)
RBC: 4.58 MIL/uL (ref 3.87–5.11)
RDW: 12.9 % (ref 11.5–15.5)
WBC: 18.1 10*3/uL — ABNORMAL HIGH (ref 4.0–10.5)
nRBC: 0 % (ref 0.0–0.2)

## 2022-11-30 LAB — BASIC METABOLIC PANEL
Anion gap: 8 (ref 5–15)
BUN: 6 mg/dL (ref 6–20)
CO2: 29 mmol/L (ref 22–32)
Calcium: 9.4 mg/dL (ref 8.9–10.3)
Chloride: 97 mmol/L — ABNORMAL LOW (ref 98–111)
Creatinine, Ser: 0.54 mg/dL (ref 0.44–1.00)
GFR, Estimated: >60 mL/min (ref >60)
Glucose, Bld: 275 mg/dL — ABNORMAL HIGH (ref 70–99)
Potassium: 4.5 mmol/L (ref 3.5–5.1)
Sodium: 134 mmol/L — ABNORMAL LOW (ref 135–145)

## 2022-11-30 LAB — D-DIMER, QUANTITATIVE: D-Dimer, Quant: <0.27 ug/mL-FEU (ref 0.00–0.50)

## 2022-11-30 LAB — CBG MONITORING, ED: Glucose-Capillary: 348 mg/dL — ABNORMAL HIGH (ref 70–99)

## 2022-11-30 LAB — RESP PANEL BY RT-PCR (RSV, FLU A&B, COVID)  RVPGX2
Influenza A by PCR: NEGATIVE
Influenza B by PCR: NEGATIVE
Resp Syncytial Virus by PCR: NEGATIVE
SARS Coronavirus 2 by RT PCR: NEGATIVE

## 2022-11-30 LAB — HCG, SERUM, QUALITATIVE: Preg, Serum: NEGATIVE

## 2022-11-30 LAB — GLUCOSE, CAPILLARY: Glucose-Capillary: 443 mg/dL — ABNORMAL HIGH (ref 70–99)

## 2022-11-30 MED ORDER — PRAVASTATIN SODIUM 40 MG PO TABS
80.0000 mg | ORAL_TABLET | Freq: Every morning | ORAL | Status: DC
  Administered 2022-12-01 – 2022-12-02 (×2): 80 mg via ORAL
  Filled 2022-11-30 (×2): qty 2

## 2022-11-30 MED ORDER — METHYLPREDNISOLONE SODIUM SUCC 125 MG IJ SOLR
125.0000 mg | Freq: Once | INTRAMUSCULAR | Status: AC
  Administered 2022-11-30: 125 mg via INTRAVENOUS
  Filled 2022-11-30: qty 2

## 2022-11-30 MED ORDER — LISINOPRIL 5 MG PO TABS
2.5000 mg | ORAL_TABLET | Freq: Every day | ORAL | Status: DC
  Administered 2022-12-01 – 2022-12-02 (×2): 2.5 mg via ORAL
  Filled 2022-11-30 (×2): qty 1

## 2022-11-30 MED ORDER — MORPHINE SULFATE (PF) 2 MG/ML IV SOLN
2.0000 mg | INTRAVENOUS | Status: DC | PRN
  Administered 2022-12-01 – 2022-12-02 (×6): 2 mg via INTRAVENOUS
  Filled 2022-11-30 (×7): qty 1

## 2022-11-30 MED ORDER — OMEGA-3-ACID ETHYL ESTERS 1 G PO CAPS
1.0000 g | ORAL_CAPSULE | Freq: Every day | ORAL | Status: DC
  Administered 2022-12-01 – 2022-12-02 (×2): 1 g via ORAL
  Filled 2022-11-30 (×2): qty 1

## 2022-11-30 MED ORDER — ARIPIPRAZOLE 5 MG PO TABS
10.0000 mg | ORAL_TABLET | Freq: Every morning | ORAL | Status: DC
  Administered 2022-12-01 – 2022-12-02 (×2): 10 mg via ORAL
  Filled 2022-11-30 (×4): qty 2

## 2022-11-30 MED ORDER — MAGNESIUM SULFATE 2 GM/50ML IV SOLN
2.0000 g | Freq: Once | INTRAVENOUS | Status: AC
  Administered 2022-11-30: 2 g via INTRAVENOUS
  Filled 2022-11-30: qty 50

## 2022-11-30 MED ORDER — SODIUM CHLORIDE 0.9 % IV SOLN
1.0000 g | INTRAVENOUS | Status: DC
  Administered 2022-12-01 – 2022-12-02 (×2): 1 g via INTRAVENOUS
  Filled 2022-11-30 (×2): qty 10

## 2022-11-30 MED ORDER — SODIUM CHLORIDE 0.9 % IV SOLN: INTRAVENOUS | Status: DC

## 2022-11-30 MED ORDER — HYDROCOD POLI-CHLORPHE POLI ER 10-8 MG/5ML PO SUER
5.0000 mL | Freq: Two times a day (BID) | ORAL | Status: DC | PRN
  Administered 2022-12-01 – 2022-12-02 (×2): 5 mL via ORAL
  Filled 2022-11-30 (×2): qty 5

## 2022-11-30 MED ORDER — GABAPENTIN 100 MG PO CAPS
100.0000 mg | ORAL_CAPSULE | Freq: Every morning | ORAL | Status: DC
  Administered 2022-12-01 – 2022-12-02 (×2): 100 mg via ORAL
  Filled 2022-11-30 (×2): qty 1

## 2022-11-30 MED ORDER — INSULIN ASPART 100 UNIT/ML IJ SOLN
0.0000 [IU] | Freq: Three times a day (TID) | INTRAMUSCULAR | Status: DC
  Administered 2022-12-01 (×2): 20 [IU] via SUBCUTANEOUS

## 2022-11-30 MED ORDER — BUSPIRONE HCL 5 MG PO TABS
15.0000 mg | ORAL_TABLET | Freq: Three times a day (TID) | ORAL | Status: DC
  Administered 2022-11-30 – 2022-12-02 (×5): 15 mg via ORAL
  Filled 2022-11-30 (×5): qty 1

## 2022-11-30 MED ORDER — ASPIRIN 81 MG PO TBEC
81.0000 mg | DELAYED_RELEASE_TABLET | Freq: Every day | ORAL | Status: DC
  Administered 2022-12-01 – 2022-12-02 (×2): 81 mg via ORAL
  Filled 2022-11-30 (×2): qty 1

## 2022-11-30 MED ORDER — BENZONATATE 100 MG PO CAPS
100.0000 mg | ORAL_CAPSULE | Freq: Three times a day (TID) | ORAL | Status: DC
  Administered 2022-11-30 – 2022-12-02 (×5): 100 mg via ORAL
  Filled 2022-11-30 (×5): qty 1

## 2022-11-30 MED ORDER — EZETIMIBE 10 MG PO TABS
10.0000 mg | ORAL_TABLET | Freq: Every day | ORAL | Status: DC
  Administered 2022-12-01 – 2022-12-02 (×2): 10 mg via ORAL
  Filled 2022-11-30 (×2): qty 1

## 2022-11-30 MED ORDER — ADULT MULTIVITAMIN W/MINERALS CH
1.0000 | ORAL_TABLET | Freq: Every morning | ORAL | Status: DC
  Administered 2022-12-01 – 2022-12-02 (×2): 1 via ORAL
  Filled 2022-11-30 (×2): qty 1

## 2022-11-30 MED ORDER — AMPHETAMINE-DEXTROAMPHETAMINE 10 MG PO TABS
30.0000 mg | ORAL_TABLET | Freq: Every day | ORAL | Status: DC
  Administered 2022-12-01 – 2022-12-02 (×2): 30 mg via ORAL
  Filled 2022-11-30 (×2): qty 3

## 2022-11-30 MED ORDER — GUAIFENESIN ER 600 MG PO TB12
600.0000 mg | ORAL_TABLET | Freq: Two times a day (BID) | ORAL | Status: DC
  Administered 2022-11-30 – 2022-12-02 (×4): 600 mg via ORAL
  Filled 2022-11-30 (×4): qty 1

## 2022-11-30 MED ORDER — MAGNESIUM HYDROXIDE 400 MG/5ML PO SUSP
30.0000 mL | Freq: Every day | ORAL | Status: DC | PRN
  Administered 2022-12-01 – 2022-12-02 (×2): 30 mL via ORAL
  Filled 2022-11-30 (×2): qty 30

## 2022-11-30 MED ORDER — OXYCODONE HCL 5 MG PO TABS
5.0000 mg | ORAL_TABLET | Freq: Once | ORAL | Status: AC
  Administered 2022-11-30: 5 mg via ORAL
  Filled 2022-11-30: qty 1

## 2022-11-30 MED ORDER — SERTRALINE HCL 100 MG PO TABS
200.0000 mg | ORAL_TABLET | Freq: Every day | ORAL | Status: DC
  Administered 2022-12-01 – 2022-12-02 (×2): 200 mg via ORAL
  Filled 2022-11-30 (×2): qty 2

## 2022-11-30 MED ORDER — PREDNISONE 20 MG PO TABS
40.0000 mg | ORAL_TABLET | Freq: Every day | ORAL | Status: DC
  Administered 2022-12-02: 40 mg via ORAL
  Filled 2022-11-30: qty 2

## 2022-11-30 MED ORDER — TRAZODONE HCL 50 MG PO TABS
25.0000 mg | ORAL_TABLET | Freq: Every evening | ORAL | Status: DC | PRN
  Administered 2022-11-30 – 2022-12-01 (×2): 25 mg via ORAL
  Filled 2022-11-30 (×2): qty 1

## 2022-11-30 MED ORDER — INSULIN ASPART PROT & ASPART (70-30 MIX) 100 UNIT/ML ~~LOC~~ SUSP
50.0000 [IU] | Freq: Two times a day (BID) | SUBCUTANEOUS | Status: DC
  Filled 2022-11-30: qty 10

## 2022-11-30 MED ORDER — TRAZODONE HCL 100 MG PO TABS
200.0000 mg | ORAL_TABLET | Freq: Every day | ORAL | Status: DC
  Administered 2022-11-30 – 2022-12-01 (×2): 200 mg via ORAL
  Filled 2022-11-30 (×3): qty 2

## 2022-11-30 MED ORDER — GABAPENTIN 300 MG PO CAPS
600.0000 mg | ORAL_CAPSULE | Freq: Every day | ORAL | Status: DC
  Administered 2022-11-30 – 2022-12-01 (×2): 600 mg via ORAL
  Filled 2022-11-30 (×2): qty 2

## 2022-11-30 MED ORDER — INSULIN ASPART PROT & ASPART (70-30 MIX) 100 UNIT/ML PEN
50.0000 [IU] | PEN_INJECTOR | Freq: Two times a day (BID) | SUBCUTANEOUS | Status: DC
  Filled 2022-11-30: qty 3

## 2022-11-30 MED ORDER — ALBUTEROL SULFATE (2.5 MG/3ML) 0.083% IN NEBU
5.0000 mg | INHALATION_SOLUTION | Freq: Once | RESPIRATORY_TRACT | Status: AC
  Administered 2022-11-30: 5 mg via RESPIRATORY_TRACT
  Filled 2022-11-30: qty 6

## 2022-11-30 MED ORDER — IPRATROPIUM-ALBUTEROL 0.5-2.5 (3) MG/3ML IN SOLN
3.0000 mL | Freq: Once | RESPIRATORY_TRACT | Status: AC
  Administered 2022-11-30: 3 mL via RESPIRATORY_TRACT
  Filled 2022-11-30: qty 3

## 2022-11-30 MED ORDER — ALBUTEROL SULFATE (2.5 MG/3ML) 0.083% IN NEBU
2.5000 mg | INHALATION_SOLUTION | Freq: Once | RESPIRATORY_TRACT | Status: AC
  Administered 2022-11-30: 2.5 mg via RESPIRATORY_TRACT
  Filled 2022-11-30: qty 3

## 2022-11-30 MED ORDER — SEMAGLUTIDE (1 MG/DOSE) 4 MG/3ML ~~LOC~~ SOPN: 1.0000 mg | PEN_INJECTOR | SUBCUTANEOUS | Status: DC

## 2022-11-30 MED ORDER — ONDANSETRON HCL 4 MG PO TABS: 4.0000 mg | ORAL_TABLET | Freq: Four times a day (QID) | ORAL | Status: DC | PRN

## 2022-11-30 MED ORDER — ACETAMINOPHEN 325 MG PO TABS: 650.0000 mg | ORAL_TABLET | Freq: Four times a day (QID) | ORAL | Status: DC | PRN

## 2022-11-30 MED ORDER — ENOXAPARIN SODIUM 40 MG/0.4ML IJ SOSY
40.0000 mg | PREFILLED_SYRINGE | INTRAMUSCULAR | Status: DC
  Administered 2022-12-01 – 2022-12-02 (×2): 40 mg via SUBCUTANEOUS
  Filled 2022-11-30 (×2): qty 0.4

## 2022-11-30 MED ORDER — ACETAMINOPHEN 650 MG RE SUPP: 650.0000 mg | Freq: Four times a day (QID) | RECTAL | Status: DC | PRN

## 2022-11-30 MED ORDER — ONDANSETRON HCL 4 MG/2ML IJ SOLN
4.0000 mg | Freq: Four times a day (QID) | INTRAMUSCULAR | Status: DC | PRN
  Administered 2022-12-01 – 2022-12-02 (×5): 4 mg via INTRAVENOUS
  Filled 2022-11-30 (×5): qty 2

## 2022-11-30 MED ORDER — IPRATROPIUM-ALBUTEROL 0.5-2.5 (3) MG/3ML IN SOLN
3.0000 mL | Freq: Four times a day (QID) | RESPIRATORY_TRACT | Status: DC
  Administered 2022-12-01 – 2022-12-02 (×6): 3 mL via RESPIRATORY_TRACT
  Filled 2022-11-30 (×6): qty 3

## 2022-11-30 MED ORDER — METHYLPREDNISOLONE SODIUM SUCC 40 MG IJ SOLR
40.0000 mg | Freq: Two times a day (BID) | INTRAMUSCULAR | Status: AC
  Administered 2022-12-01 (×2): 40 mg via INTRAVENOUS
  Filled 2022-11-30 (×2): qty 1

## 2022-11-30 NOTE — ED Provider Notes (Signed)
3:37 PM Assumed care of patient from off-going team. For more details, please see note from same day.  In brief, this is a 42 y.o. female p/w cough, dyspnea, wheezing w/ h/o asthma. SpO2 91% on RA. R leg > swollen than L leg. Dimer normal. S/p nebs/solumedrol. CXR w/o infiltrate.  Plan/Dispo at time of sign-out & ED Course since sign-out: [ ]$  DVT US [ ]$  reevaluation, possible admission  BP 100/70 (BP Location: Right Arm)   Pulse (!) 119   Temp 98.3 F (36.8 C) (Oral)   Resp 15   LMP 11/08/2022 (Approximate)   SpO2 100%    ED Course:   Clinical Course as of 11/30/22 1848  Tue Nov 30, 2022  1514 Chest x-ray reviewed interpreted and no evidence of focal infiltrate noted radiologist interpretation notes bronchitic pattern [DR]  1604 US Venous Img Lower Bilateral (DVT) 1. No sonographic findings of deep vein thrombosis in either lower extremity.   [HN]  [id number] In s/o negative D-dimer and negative DVT US, unlikely to have PE. Do not believe CT PE is necessary at this time.  [HN]  [id number] Reevaluated patient. She feels very short of breath and still with inspiratory/expiratory wheezing on exam. States she is unimproved after 2 albuterol nebs and solumedrol. Will give duonebs and Mg and consult for admission.  [HN]    Clinical Course User Index [DR] Pattricia Boss, MD [HN] Audley Hose, MD    Dispo: Admit to obs ------------------------------- Cindee Lame, MD Emergency Medicine  This note was created using dictation software, which may contain spelling or grammatical errors.   Audley Hose, MD 11/30/22 [phone]

## 2022-11-30 NOTE — Progress Notes (Signed)
Plan of Care Note for accepted transfer  Patient: [name]    [id number]  DOA: 11/30/2022     Nursing staff, Please call [facility] number on Amion as soon as patient's arrival to the unit (not the listed attending) so that the appropriate admitting provider can evaluate the pt. ASAP to avoid any delay in care.  Facility requesting transfer: [facility] Requesting Provider: Dr. Ray/Dr. Mayra Neer Reason for transfer: Admission Facility course: Patient with PMH of asthma, smoking, HLD, type II DM, mood disorder, neuropathy, chronic back pain presented to hospital with complaints of shortness of breath ongoing for last 4 days. Recently completed a course of steroids 4 days ago. Patient has used nebulizer therapy without any benefit at home. Present to ED and was treated with magnesium, Solu-Medrol as well as continuous albuterol nebulization without any benefit with ongoing shortness of breath, tachycardia and tachypnea. Therefore patient was referred for admission. Lower extremity Dopplers negative.  Chest x-ray negative for any acute pneumonia.  Does have some evidence of bronchitis. Currently not hypoxic  Plan of care: The patient is accepted for admission to [facility]  unit, at [facility].    Author: Berle Mull, MD  11/30/2022  Check [email or web address] for on-call coverage.

## 2022-11-30 NOTE — Assessment & Plan Note (Addendum)
-   We will continue statin therapy and fish oil/Lovaza.

## 2022-11-30 NOTE — H&P (Signed)
[place]   PATIENT NAME: [name]    MR#:  [id number]  DATE OF BIRTH:  [birth year]  DATE OF ADMISSION:  11/30/2022  PRIMARY CARE PHYSICIAN: Jettie Booze, NP   Patient is coming from: Home  REQUESTING/REFERRING PHYSICIAN: [facility]-Dr. Ray/Dr. Mayra Neer  CHIEF COMPLAINT:   Chief Complaint  Patient presents with   Shortness of Breath    HISTORY OF PRESENT ILLNESS:  Kiaja A Gurtha Lindwall is a 42 y.o. Caucasian female with medical history significant for asthma, tobacco abuse, dyslipidemia, peripheral neuropathy, anxiety and depression, type 2 diabetes mellitus, OCD and chronic pain syndrome, presented to the ER with acute onset of worsening dyspnea over the last 4 days with associated cough occasionally productive of yellowish-green sputum and wheezing.  She has been using her nebulized bronchodilator therapy at home without significant help.  No chest pain or palpitations.  No fever or chills.  No nausea or vomiting or abdominal pain.  No bleeding diathesis.  No dysuria, oliguria or hematuria or flank pain.  She completed a course of steroids 4 days ago.  She continues to smoke half a pack of cigarettes per day.  She stated that she tested negative for COPD in the past.  ED Course: Labs revealed mild hyponatremia and hypochloremia, hyperglycemia of 275 and leukocytosis of 18.1.  Serum pregnancy test was negative.  Influenza, RSV and COVID-19 PCR were negative.  Imaging: Bilateral lower extremity venous Doppler came back negative for DVT.  2 view chest x-ray showed bronchitis pattern with no consolidation or collapse.  The patient was given nebulized albuterol 2.5 mg and 5 mg, DuoNeb, 2 g of IV magnesium sulfate, 125 mg of IV Solu-Medrol and 5 mg of p.o. oxycodone.  She will be admitted to a medical observation bed for further evaluation and management. PAST MEDICAL HISTORY:   Past Medical History:  Diagnosis Date   Anxiety    Asthma    Back pain    CAP  (community acquired pneumonia)    Chronic pain syndrome    Depression    Diabetes mellitus without complication ([place])    Gallbladder problem    Obsessive compulsive disorder     PAST SURGICAL HISTORY:   Past Surgical History:  Procedure Laterality Date   CESAREAN SECTION     CHOLECYSTECTOMY  2009   wisdom teeth extraction      SOCIAL HISTORY:   Social History   Tobacco Use   Smoking status: Every Day    Packs/day: 0.50    Years: 23.00    Total pack years: 11.50    Types: Cigarettes   Smokeless tobacco: Never  Substance Use Topics   Alcohol use: Not Currently    Comment: unsure amount (1/6) - states generally does not drink    FAMILY HISTORY:   Family History  Problem Relation Age of Onset   Diabetes insipidus Father    Arthritis Father    Heart failure Father    COPD Father    Emphysema Father    Asthma Son     DRUG ALLERGIES:   Allergies  Allergen Reactions   Orange Oil Shortness Of Breath   Penicillins Itching and Rash   Carisoprodol Other (See Comments)    Skin peeled    Piroxicam Other (See Comments)    Skin peeled    REVIEW OF SYSTEMS:   ROS As per history of present illness. All pertinent systems were reviewed above. Constitutional, HEENT, cardiovascular, respiratory, GI, GU, musculoskeletal, neuro,  psychiatric, endocrine, integumentary and hematologic systems were reviewed and are otherwise negative/unremarkable except for positive findings mentioned above in the HPI.   MEDICATIONS AT HOME:   Prior to Admission medications   Medication Sig Start Date End Date Taking? Authorizing Provider  acetaminophen (TYLENOL) 500 MG tablet Take 1 tablet (500 mg total) by mouth every 6 (six) hours as needed. Patient taking differently: Take 500 mg by mouth every 6 (six) hours as needed for moderate pain. 04/26/22   Redwine, Madison A, PA-C  albuterol (VENTOLIN HFA) 108 (90 Base) MCG/ACT inhaler Inhale 1-2 puffs into the lungs every 6 (six) hours as needed  for wheezing or shortness of breath. 08/13/22   Dessa Phi, DO  amphetamine-dextroamphetamine (ADDERALL) 30 MG tablet Take 30 mg by mouth daily. 09/28/20   [provider]  ARIPiprazole (ABILIFY) 10 MG tablet Take 10 mg by mouth every morning. 01/21/21   [provider]  aspirin EC 81 MG tablet Take 81 mg by mouth daily. Swallow whole.    [provider]  BD PEN NEEDLE NANO 2ND GEN 32G X 4 MM MISC daily. 09/23/20   [provider]  benzonatate (TESSALON) 100 MG capsule Take 1 capsule (100 mg total) by mouth every 8 (eight) hours. 08/04/22   Maudie Flakes, MD  busPIRone (BUSPAR) 15 MG tablet Take 15 mg by mouth 3 (three) times daily. 10/14/22   [provider]  Continuous Blood Gluc Sensor (FREESTYLE LIBRE 3 SENSOR) MISC 1 each every 14 (fourteen) days. 11/21/22   [provider]  Empagliflozin-metFORMIN HCl (SYNJARDY) 12.02-999 MG TABS Take 1 tablet by mouth 2 (two) times daily. 02/12/21   [provider]  ezetimibe (ZETIA) 10 MG tablet Take 1 tablet (10 mg total) by mouth daily. 09/26/17   Scot Jun, NP  Fluticasone Furoate-Vilanterol (BREO ELLIPTA) 50-25 MCG/ACT AEPB Inhale 1 puff into the lungs daily in the afternoon. 08/13/22   Dessa Phi, DO  gabapentin (NEURONTIN) 100 MG capsule Take 1 capsule (100 mg total) by mouth daily after breakfast. Patient taking differently: Take 100 mg by mouth every morning. 02/14/20   Jessy Oto, MD  gabapentin (NEURONTIN) 300 MG capsule Take 1-2 capsules (300-600 mg total) by mouth at bedtime. Only take as prescribed.  Husband is to administer medication Patient taking differently: Take 600 mg by mouth at bedtime. 02/14/20   Jessy Oto, MD  lisinopril (PRINIVIL,ZESTRIL) 2.5 MG tablet Take 1 tablet (2.5 mg total) by mouth daily. 05/10/17   Scot Jun, NP  Multiple Vitamin (MULTIVITAMIN WITH MINERALS) TABS tablet Take 1 tablet by mouth every morning.    [provider]   NOVOLOG MIX 70/30 FLEXPEN (70-30) 100 UNIT/ML FlexPen Inject 50 Units into the skin 2 (two) times daily. 11/25/22   [provider]  Omega-3 Fatty Acids (FISH OIL PO) Take 1 capsule by mouth every morning.    [provider]  pravastatin (PRAVACHOL) 80 MG tablet Take 1 tablet (80 mg total) by mouth daily. Patient taking differently: Take 80 mg by mouth every morning. 05/11/17   Scot Jun, NP  predniSONE (DELTASONE) 10 MG tablet Take 4 tabs for 3 days, then 3 tabs for 3 days, then 2 tabs for 3 days, then 1 tab for 3 days, then 1/2 tab for 4 days. 08/13/22   Dessa Phi, DO  predniSONE (DELTASONE) 10 MG tablet Take 4 tablets (40 mg total) by mouth daily. 11/14/22   Fredia Sorrow, MD  Semaglutide, 1 MG/DOSE, ([place], 1  MG/DOSE,) 4 MG/3ML SOPN Inject 1 mg into the skin every Wednesday. 02/12/21   [provider]  sertraline (ZOLOFT) 100 MG tablet Take 2 tablets (200 mg total) by mouth daily. For depression and anxiety. 06/13/13   Ruben Im, PA-C  traZODone (DESYREL) 100 MG tablet Take 200 mg by mouth at bedtime. 01/21/21   [provider]      VITAL SIGNS:  Blood pressure 129/73, pulse (!) 118, temperature 97.8 F (36.6 C), resp. rate (!) 21, last menstrual period 11/08/2022, SpO2 (!) 89 %.  PHYSICAL EXAMINATION:  Physical Exam  GENERAL:  42 y.o.-year-old Caucasian female patient lying in the bed with no acute distress.  EYES: Pupils equal, round, reactive to light and accommodation. No scleral icterus. Extraocular muscles intact.  HEENT: Head atraumatic, normocephalic. Oropharynx and nasopharynx clear.  NECK:  Supple, no jugular venous distention. No thyroid enlargement, no tenderness.  LUNGS: Diffuse expiratory wheezes with tight expiratory airflow and harsh vesicular breathing.  No use of accessory muscles of respiration.  CARDIOVASCULAR: Regular rate and rhythm, S1, S2 normal. No murmurs, rubs, or gallops.  ABDOMEN: Soft, nondistended,  nontender. Bowel sounds present. No organomegaly or mass.  EXTREMITIES: No pedal edema, cyanosis, or clubbing.  NEUROLOGIC: Cranial nerves II through XII are intact. Muscle strength 5/5 in all extremities. Sensation intact. Gait not checked.  PSYCHIATRIC: The patient is alert and oriented x 3.  Normal affect and good eye contact. SKIN: No obvious rash, lesion, or ulcer.   LABORATORY PANEL:   CBC Recent Labs  Lab 11/30/22 1452  WBC 18.1*  HGB 13.9  HCT 41.2  PLT 306   ------------------------------------------------------------------------------------------------------------------  Chemistries  Recent Labs  Lab 11/30/22 1452  NA 134*  K 4.5  CL 97*  CO2 29  GLUCOSE 275*  BUN 6  CREATININE 0.54  CALCIUM 9.4   ------------------------------------------------------------------------------------------------------------------  Cardiac Enzymes No results for input(s): "TROPONINI" in the last 168 hours. ------------------------------------------------------------------------------------------------------------------  RADIOLOGY:  US Venous Img Lower Bilateral (DVT)  Result Date: 11/30/2022 CLINICAL DATA:  Bilateral leg swelling, right greater than left. EXAM: BILATERAL LOWER EXTREMITY VENOUS DOPPLER ULTRASOUND TECHNIQUE: Gray-scale sonography with compression, as well as color and duplex ultrasound, were performed to evaluate the deep venous system(s) from the level of the common femoral vein through the popliteal and proximal calf veins. COMPARISON:  None Available. FINDINGS: VENOUS Normal compressibility of the common femoral, superficial femoral, and popliteal veins, as well as the visualized calf veins. Visualized portions of profunda femoral vein and great saphenous vein unremarkable. No filling defects to suggest DVT on grayscale or color Doppler imaging. Doppler waveforms show normal direction of venous flow, normal respiratory plasticity and response to augmentation. OTHER  None. Limitations: none IMPRESSION: 1. No sonographic findings of deep vein thrombosis in either lower extremity. Electronically Signed   By: Van Clines M.D.   On: 11/30/2022 15:49   DG Chest 2 View  Result Date: 11/30/2022 CLINICAL DATA:  Shortness of breath, chest congestion and wheezing. EXAM: CHEST - 2 VIEW COMPARISON:  11/14/2022 FINDINGS: Heart size is normal. Mediastinal shadows are normal. There appears to be bronchial thickening consistent with bronchitis. No consolidation, collapse or effusion. No abnormal bone finding. IMPRESSION: Bronchitis pattern. No consolidation or collapse. Electronically Signed   By: Nelson Chimes M.D.   On: 11/30/2022 13:36      IMPRESSION AND PLAN:  Assessment and Plan: * Acute severe exacerbation of moderate persistent asthma - The patient be admitted to a medical observation bed. - We will  continue steroid therapy with IV Solu-Medrol. - We will continue bronchodilator therapy with DuoNebs 4 times daily and every 4 hours as needed. - We will place her on mucolytic therapy. - We will hold off long-acting beta agonist. - We will place her on IV Rocephin for her acute bronchitis especially given her history of tobacco abuse.  Uncontrolled type 2 diabetes mellitus with hyperglycemia, with long-term current use of insulin ([place]) - The patient will be placed on supplemental coverage with NovoLog. - We will continue basal coverage and glipizide and hold off metformin.  Essential hypertension - We will continue her antihypertensives.  Dyslipidemia - We will continue statin therapy and fish oil/Lovaza.  Anxiety and depression - We will continue Zoloft and trazodone.  Diabetic neuropathy ([facility]) - We will continue Neurontin.  Tobacco use - She was counseled for smoking cessation and will receive further counseling here.   DVT prophylaxis: Lovenox.  Advanced Care Planning:  Code Status: full code.  Family Communication:  The plan of care was  discussed in details with the patient (and family). I answered all questions. The patient agreed to proceed with the above mentioned plan. Further management will depend upon hospital course. Disposition Plan: Back to previous home environment Consults called: none.  All the records are reviewed and case discussed with ED provider.  Status is: Observation  I certify that at the time of admission, it is my clinical judgment that the patient will require  hospital care extending less than 2 midnights.                            Dispo: The patient is from: Home              Anticipated d/c is to: Home              Patient currently is not medically stable to d/c.              Difficult to place patient: No  Christel Mormon M.D on 11/30/2022 at 10:54 PM  [facility]   From 7 PM-7 AM, contact night-coverage [email or web address]  CC: Primary care physician; Jettie Booze, NP

## 2022-11-30 NOTE — ED Notes (Signed)
Called [facility] -- informed that patient Bed Assignment is Ready

## 2022-11-30 NOTE — Assessment & Plan Note (Signed)
-   The patient will be placed on supplemental coverage with NovoLog. - We will continue basal coverage and glipizide and hold off metformin.

## 2022-11-30 NOTE — Assessment & Plan Note (Signed)
She was counseled for smoking cessation and will receive further counseling here. 

## 2022-11-30 NOTE — ED Provider Notes (Signed)
[facility] Provider Note   CSN: [id number] Arrival date & time: 11/30/22  1307     History  Chief Complaint  Patient presents with   Shortness of Breath    [name] is a 42 y.o. female.  HPI 42 year old female history of asthma, smoking, hypercholesterolemia, hyperlipidemia, diabetes, presents today complaining of cough and dyspnea.  She reports that she recently was on steroids and has completed this about 4 days ago.  She has had some increased wheezing yesterday.  Her albuterol HFA was not working and her primary care doctor called in a nebulizer.  She used this without relief.  She has had some productive cough.  She has had some subjective fever.  Her nephew has been ill but does not have a definite diagnosis.    Home Medications Prior to Admission medications   Medication Sig Start Date End Date Taking? Authorizing Provider  acetaminophen (TYLENOL) 500 MG tablet Take 1 tablet (500 mg total) by mouth every 6 (six) hours as needed. Patient taking differently: Take 500 mg by mouth every 6 (six) hours as needed for moderate pain. 04/26/22   Redwine, Madison A, PA-C  albuterol (VENTOLIN HFA) 108 (90 Base) MCG/ACT inhaler Inhale 1-2 puffs into the lungs every 6 (six) hours as needed for wheezing or shortness of breath. 08/13/22   Dessa Phi, DO  amphetamine-dextroamphetamine (ADDERALL) 30 MG tablet Take 30 mg by mouth daily. 09/28/20   [provider]  ARIPiprazole (ABILIFY) 10 MG tablet Take 10 mg by mouth every morning. 01/21/21   [provider]  aspirin EC 81 MG tablet Take 81 mg by mouth daily. Swallow whole.    [provider]  BD PEN NEEDLE NANO 2ND GEN 32G X 4 MM MISC daily. 09/23/20   [provider]  benzonatate (TESSALON) 100 MG capsule Take 1 capsule (100 mg total) by mouth every 8 (eight) hours. 08/04/22   Maudie Flakes, MD  busPIRone (BUSPAR) 15 MG tablet Take 15 mg by  mouth 3 (three) times daily. 10/14/22   [provider]  Continuous Blood Gluc Sensor (FREESTYLE LIBRE 3 SENSOR) MISC 1 each every 14 (fourteen) days. 11/21/22   [provider]  Empagliflozin-metFORMIN HCl (SYNJARDY) 12.02-999 MG TABS Take 1 tablet by mouth 2 (two) times daily. 02/12/21   [provider]  ezetimibe (ZETIA) 10 MG tablet Take 1 tablet (10 mg total) by mouth daily. 09/26/17   Scot Jun, NP  Fluticasone Furoate-Vilanterol (BREO ELLIPTA) 50-25 MCG/ACT AEPB Inhale 1 puff into the lungs daily in the afternoon. 08/13/22   Dessa Phi, DO  gabapentin (NEURONTIN) 100 MG capsule Take 1 capsule (100 mg total) by mouth daily after breakfast. Patient taking differently: Take 100 mg by mouth every morning. 02/14/20   Jessy Oto, MD  gabapentin (NEURONTIN) 300 MG capsule Take 1-2 capsules (300-600 mg total) by mouth at bedtime. Only take as prescribed.  Husband is to administer medication Patient taking differently: Take 600 mg by mouth at bedtime. 02/14/20   Jessy Oto, MD  lisinopril (PRINIVIL,ZESTRIL) 2.5 MG tablet Take 1 tablet (2.5 mg total) by mouth daily. 05/10/17   Scot Jun, NP  Multiple Vitamin (MULTIVITAMIN WITH MINERALS) TABS tablet Take 1 tablet by mouth every morning.    [provider]  NOVOLOG MIX 70/30 FLEXPEN (70-30) 100 UNIT/ML FlexPen Inject 50 Units into the skin 2 (two) times daily. 11/25/22   [provider]  Omega-3 Fatty Acids (  FISH OIL PO) Take 1 capsule by mouth every morning.    [provider]  pravastatin (PRAVACHOL) 80 MG tablet Take 1 tablet (80 mg total) by mouth daily. Patient taking differently: Take 80 mg by mouth every morning. 05/11/17   Scot Jun, NP  predniSONE (DELTASONE) 10 MG tablet Take 4 tabs for 3 days, then 3 tabs for 3 days, then 2 tabs for 3 days, then 1 tab for 3 days, then 1/2 tab for 4 days. 08/13/22   Dessa Phi, DO  predniSONE (DELTASONE) 10 MG tablet Take 4 tablets  (40 mg total) by mouth daily. 11/14/22   Fredia Sorrow, MD  Semaglutide, 1 MG/DOSE, ([place]ZEM[place], 1 MG/DOSE,) 4 MG/3ML SOPN Inject 1 mg into the skin every Wednesday. 02/12/21   [provider]  sertraline (ZOLOFT) 100 MG tablet Take 2 tablets (200 mg total) by mouth daily. For depression and anxiety. 06/13/13   Ruben Im, PA-C  traZODone (DESYREL) 100 MG tablet Take 200 mg by mouth at bedtime. 01/21/21   [provider]      Allergies    Orange oil, Penicillins, Carisoprodol, and Piroxicam    Review of Systems   Review of Systems  Physical Exam Updated Vital Signs BP 100/70 (BP Location: Right Arm)   Pulse (!) 119   Temp 98.3 F (36.8 C) (Oral)   Resp 15   LMP 11/08/2022 (Approximate)   SpO2 100%  Physical Exam Vitals reviewed.  Constitutional:      General: She is not in acute distress.    Appearance: She is ill-appearing.  HENT:     Head: Normocephalic.     Mouth/Throat:     Mouth: Mucous membranes are moist.  Eyes:     Pupils: Pupils are equal, round, and reactive to light.  Cardiovascular:     Rate and Rhythm: Regular rhythm. Tachycardia present.  Pulmonary:     Effort: Tachypnea and accessory muscle usage present.     Breath sounds: Examination of the right-middle field reveals wheezing. Examination of the left-middle field reveals wheezing. Wheezing present.  Abdominal:     Palpations: Abdomen is soft.  Musculoskeletal:     Cervical back: Normal range of motion.     Comments: Swelling right lower extremity greater than left lower extremity with some tenderness  Skin:    General: Skin is warm and dry.     Capillary Refill: Capillary refill takes less than 2 seconds.  Neurological:     General: No focal deficit present.     Mental Status: She is alert.  Psychiatric:        Mood and Affect: Mood normal.     ED Results / Procedures / Treatments   Labs (all labs ordered are listed, but only abnormal results are displayed) Labs Reviewed  CBC  - Abnormal; Notable for the following components:      Result Value   WBC 18.1 (*)    All other components within normal limits  BASIC METABOLIC PANEL - Abnormal; Notable for the following components:   Sodium 134 (*)    Chloride 97 (*)    Glucose, Bld 275 (*)    All other components within normal limits  RESP PANEL BY RT-PCR (RSV, FLU A&B, COVID)  RVPGX2  HCG, SERUM, QUALITATIVE  D-DIMER, QUANTITATIVE    EKG None  Radiology DG Chest 2 View  Result Date: 11/30/2022 CLINICAL DATA:  Shortness of breath, chest congestion and wheezing. EXAM: CHEST - 2 VIEW COMPARISON:  11/14/2022 FINDINGS:  Heart size is normal. Mediastinal shadows are normal. There appears to be bronchial thickening consistent with bronchitis. No consolidation, collapse or effusion. No abnormal bone finding. IMPRESSION: Bronchitis pattern. No consolidation or collapse. Electronically Signed   By: Nelson Chimes M.D.   On: 11/30/2022 13:36    Procedures Procedures    Medications Ordered in ED Medications  albuterol (PROVENTIL) (2.5 MG/3ML) 0.083% nebulizer solution 2.5 mg (2.5 mg Nebulization Given 11/30/22 1320)  methylPREDNISolone sodium succinate (SOLU-MEDROL) 125 mg/2 mL injection 125 mg (125 mg Intravenous Given 11/30/22 1503)  albuterol (PROVENTIL) (2.5 MG/3ML) 0.083% nebulizer solution 5 mg (5 mg Nebulization Given 11/30/22 1529)    ED Course/ Medical Decision Making/ A&P Clinical Course as of 11/30/22 1546  Tue Nov 30, 2022  1514 Chest x-Shaira Sova reviewed interpreted and no evidence of focal infiltrate noted radiologist interpretation notes bronchitic pattern [DR]    Clinical Course User Index [DR] Pattricia Boss, MD                             Medical Decision Making Amount and/or Complexity of Data Reviewed Labs: ordered. Radiology: ordered.  Risk Prescription drug management.   42 year old female history of asthma, diabetes, presents with dyspnea and wheezing. Differential diagnosis includes but is not  limited to pneumonia, asthma exacerbation, other viral infections and bronchitis, PE considered as patient is very tachycardic and has some hypoxia with sats of 90%. Patient is being evaluated for infection with COVID swab.  Patient has some wheezing on my exam and was given Solu-Medrol and repeat nebulizer treatment ordered Discussed with Dr. Mayra Neer who has assumed care and will reevaluate after doppler and med 1-dyspnea probably secondary to COPD/asthma exacerbation with the patient with ongoing smoking.  No focal infiltrate.  Leukocytosis to 18,000 patient with ongoing COVID and flu swab pending 2-asymmetric leg swelling Dopplers pending       Final Clinical Impression(s) / ED Diagnoses Final diagnoses:  Dyspnea, unspecified type    Rx / DC Orders ED Discharge Orders     None         Pattricia Boss, MD 11/30/22 1546

## 2022-11-30 NOTE — Assessment & Plan Note (Signed)
-   We will continue her antihypertensives. 

## 2022-11-30 NOTE — Assessment & Plan Note (Signed)
-   We will continue Zoloft and trazodone.

## 2022-11-30 NOTE — Assessment & Plan Note (Addendum)
-   The patient be admitted to a medical observation bed. - We will continue steroid therapy with IV Solu-Medrol. - We will continue bronchodilator therapy with DuoNebs 4 times daily and every 4 hours as needed. - We will place her on mucolytic therapy. - We will hold off long-acting beta agonist. - We will place her on IV Rocephin for her acute bronchitis especially given her history of tobacco abuse, purulent sputum production and significant leukocytosis though this could be related to recent steroid intake.Marland Kitchen

## 2022-11-30 NOTE — ED Triage Notes (Signed)
Pt reports SHOB x 2 days. Pt has audible wheezing in triage. Reports cough and back pain. Denies fevers.

## 2022-11-30 NOTE — Assessment & Plan Note (Signed)
-   We will continue Neurontin. ?

## 2022-12-01 DIAGNOSIS — Z7984 Long term (current) use of oral hypoglycemic drugs: Secondary | ICD-10-CM | POA: Diagnosis not present

## 2022-12-01 DIAGNOSIS — E871 Hypo-osmolality and hyponatremia: Secondary | ICD-10-CM | POA: Diagnosis present

## 2022-12-01 DIAGNOSIS — F419 Anxiety disorder, unspecified: Secondary | ICD-10-CM | POA: Diagnosis present

## 2022-12-01 DIAGNOSIS — Z8261 Family history of arthritis: Secondary | ICD-10-CM | POA: Diagnosis not present

## 2022-12-01 DIAGNOSIS — F32A Depression, unspecified: Secondary | ICD-10-CM | POA: Diagnosis present

## 2022-12-01 DIAGNOSIS — Z880 Allergy status to penicillin: Secondary | ICD-10-CM | POA: Diagnosis not present

## 2022-12-01 DIAGNOSIS — J45901 Unspecified asthma with (acute) exacerbation: Secondary | ICD-10-CM | POA: Diagnosis present

## 2022-12-01 DIAGNOSIS — J9601 Acute respiratory failure with hypoxia: Secondary | ICD-10-CM | POA: Diagnosis present

## 2022-12-01 DIAGNOSIS — Z825 Family history of asthma and other chronic lower respiratory diseases: Secondary | ICD-10-CM | POA: Diagnosis not present

## 2022-12-01 DIAGNOSIS — I10 Essential (primary) hypertension: Secondary | ICD-10-CM | POA: Diagnosis present

## 2022-12-01 DIAGNOSIS — Z9109 Other allergy status, other than to drugs and biological substances: Secondary | ICD-10-CM | POA: Diagnosis not present

## 2022-12-01 DIAGNOSIS — E878 Other disorders of electrolyte and fluid balance, not elsewhere classified: Secondary | ICD-10-CM | POA: Diagnosis present

## 2022-12-01 DIAGNOSIS — J209 Acute bronchitis, unspecified: Secondary | ICD-10-CM | POA: Diagnosis present

## 2022-12-01 DIAGNOSIS — E1165 Type 2 diabetes mellitus with hyperglycemia: Secondary | ICD-10-CM | POA: Diagnosis present

## 2022-12-01 DIAGNOSIS — E7800 Pure hypercholesterolemia, unspecified: Secondary | ICD-10-CM | POA: Diagnosis present

## 2022-12-01 DIAGNOSIS — F429 Obsessive-compulsive disorder, unspecified: Secondary | ICD-10-CM | POA: Diagnosis present

## 2022-12-01 DIAGNOSIS — Z8249 Family history of ischemic heart disease and other diseases of the circulatory system: Secondary | ICD-10-CM | POA: Diagnosis not present

## 2022-12-01 DIAGNOSIS — E1142 Type 2 diabetes mellitus with diabetic polyneuropathy: Secondary | ICD-10-CM | POA: Diagnosis present

## 2022-12-01 DIAGNOSIS — J4541 Moderate persistent asthma with (acute) exacerbation: Secondary | ICD-10-CM | POA: Diagnosis present

## 2022-12-01 DIAGNOSIS — Z7982 Long term (current) use of aspirin: Secondary | ICD-10-CM | POA: Diagnosis not present

## 2022-12-01 DIAGNOSIS — Z888 Allergy status to other drugs, medicaments and biological substances status: Secondary | ICD-10-CM | POA: Diagnosis not present

## 2022-12-01 DIAGNOSIS — F17210 Nicotine dependence, cigarettes, uncomplicated: Secondary | ICD-10-CM | POA: Diagnosis present

## 2022-12-01 DIAGNOSIS — G894 Chronic pain syndrome: Secondary | ICD-10-CM | POA: Diagnosis present

## 2022-12-01 DIAGNOSIS — Z1152 Encounter for screening for COVID-19: Secondary | ICD-10-CM | POA: Diagnosis not present

## 2022-12-01 DIAGNOSIS — T380X5A Adverse effect of glucocorticoids and synthetic analogues, initial encounter: Secondary | ICD-10-CM | POA: Diagnosis present

## 2022-12-01 LAB — CBC
HCT: 39.6 % (ref 36.0–46.0)
Hemoglobin: 13.5 g/dL (ref 12.0–15.0)
MCH: 31 pg (ref 26.0–34.0)
MCHC: 34.1 g/dL (ref 30.0–36.0)
MCV: 91 fL (ref 80.0–100.0)
Platelets: 299 10*3/uL (ref 150–400)
RBC: 4.35 MIL/uL (ref 3.87–5.11)
RDW: 12.9 % (ref 11.5–15.5)
WBC: 12.9 10*3/uL — ABNORMAL HIGH (ref 4.0–10.5)
nRBC: 0 % (ref 0.0–0.2)

## 2022-12-01 LAB — BASIC METABOLIC PANEL
Anion gap: 13 (ref 5–15)
BUN: 10 mg/dL (ref 6–20)
CO2: 24 mmol/L (ref 22–32)
Calcium: 8.7 mg/dL — ABNORMAL LOW (ref 8.9–10.3)
Chloride: 94 mmol/L — ABNORMAL LOW (ref 98–111)
Creatinine, Ser: 0.69 mg/dL (ref 0.44–1.00)
GFR, Estimated: >60 mL/min (ref >60)
Glucose, Bld: 461 mg/dL — ABNORMAL HIGH (ref 70–99)
Potassium: 4.7 mmol/L (ref 3.5–5.1)
Sodium: 131 mmol/L — ABNORMAL LOW (ref 135–145)

## 2022-12-01 LAB — GLUCOSE, CAPILLARY
Glucose-Capillary: 369 mg/dL — ABNORMAL HIGH (ref 70–99)
Glucose-Capillary: 103 mg/dL — ABNORMAL HIGH (ref 70–99)
Glucose-Capillary: 94 mg/dL (ref 70–99)
Glucose-Capillary: 317 mg/dL — ABNORMAL HIGH (ref 70–99)
Glucose-Capillary: 295 mg/dL — ABNORMAL HIGH (ref 70–99)
Glucose-Capillary: 170 mg/dL — ABNORMAL HIGH (ref 70–99)

## 2022-12-01 LAB — HEMOGLOBIN A1C
Hgb A1c MFr Bld: 9.4 % — ABNORMAL HIGH (ref 4.8–5.6)
Mean Plasma Glucose: 223.08 mg/dL

## 2022-12-01 MED ORDER — INSULIN ASPART PROT & ASPART (70-30 MIX) 100 UNIT/ML ~~LOC~~ SUSP
50.0000 [IU] | Freq: Two times a day (BID) | SUBCUTANEOUS | Status: DC
  Administered 2022-12-01 – 2022-12-02 (×4): 50 [IU] via SUBCUTANEOUS

## 2022-12-01 MED ORDER — SEMAGLUTIDE (1 MG/DOSE) 4 MG/3ML ~~LOC~~ SOPN: 1.0000 mg | PEN_INJECTOR | SUBCUTANEOUS | Status: DC

## 2022-12-01 MED ORDER — BISACODYL 5 MG PO TBEC
5.0000 mg | DELAYED_RELEASE_TABLET | Freq: Every day | ORAL | Status: DC | PRN
  Administered 2022-12-01: 5 mg via ORAL
  Filled 2022-12-01: qty 1

## 2022-12-01 MED ORDER — INSULIN ASPART 100 UNIT/ML IJ SOLN
0.0000 [IU] | Freq: Three times a day (TID) | INTRAMUSCULAR | Status: DC
  Administered 2022-12-01: 4 [IU] via SUBCUTANEOUS
  Administered 2022-12-01: 11 [IU] via SUBCUTANEOUS
  Administered 2022-12-01: 15 [IU] via SUBCUTANEOUS
  Administered 2022-12-02 (×2): 7 [IU] via SUBCUTANEOUS

## 2022-12-01 NOTE — Inpatient Diabetes Management (Signed)
Inpatient Diabetes Program Recommendations  AACE/ADA: New Consensus Statement on Inpatient Glycemic Control (2015)  Target Ranges:  Prepandial:   less than 140 mg/dL      Peak postprandial:   less than 180 mg/dL (1-2 hours)      Critically ill patients:  140 - 180 mg/dL   Lab Results  Component Value Date   GLUCAP 317 (H) 12/01/2022   HGBA1C 9.4 (H) 12/01/2022    Review of Glycemic Control  Diabetes history: DM2 Outpatient Diabetes medications: Novolog 70/30 44 units BID, Ozempic 2 mg weekly, Synjardy 12.02/999 mg BID Current orders for Inpatient glycemic control: Novolog 70/30 50 units BID, Novolog 0-20 TID with meals and 0-5 HS, Ozempic 1 mg weekly on Fridays  HgbA1C - 9.4% CBGs 94, 317 mg/dL On Solumedrol 40 Q12H, then Pred 40 QD  Inpatient Diabetes Program Recommendations:   Agree with orders.  Spoke with pt at bedside regarding her diabetes and HgbA1C of 9.4%. Pt states it usually runs between 6.5-7% when not on steroids. Endo is Dr Hartford Poli. Sees every 3 months. States Dr Hartford Poli is planning to change her Ozempic to [facility] for improved weight loss. Said she's only taking insulin right now d/t steroids. Had stopped taking it a few months ago. Has CGM Elenor Legato). Tries to eat healthy, although does not get much exercise d/t back issues. Good understanding of her diabetes and had no questions.   Will continue to follow.  Thank you. Lorenda Peck, RD, LDN, [facility] Inpatient Diabetes Coordinator [phone]

## 2022-12-01 NOTE — Progress Notes (Signed)
PROGRESS NOTE  [name]  [id number] DOB: June 05, 1981 DOA: 11/30/2022 PCP: Jettie Booze, NP   Brief Narrative: Patient is a 42 year old female with history of intermittent asthma, tobacco use, hyperlipidemia, peripheral neuropathy, diabetes type 2, OCD, chronic pain syndrome who presented with shortness of breath, cough with yellow-green sputum, wheezing.  She was recently seen at [place] at [facility] for the same complaints and was prescribed prednisone and inhalers but it did not help.  On condition, lab work showed WBC count of 18.1, COVID/flu/RSV negative.  Chest x-ray showed features of bronchitis.  Patient was started on IV steroids, antibiotics.  Assessment & Plan:  Principal Problem:   Acute severe exacerbation of moderate persistent asthma Active Problems:   Uncontrolled type 2 diabetes mellitus with hyperglycemia, with long-term current use of insulin (HCC)   Essential hypertension   Dyslipidemia   Anxiety and depression   Diabetic neuropathy (HCC)   Tobacco use  Acute severe exacerbation of mild intermittent asthma: Asthma attacks very infrequent.  She says she gets attack when she falls sick.  Last attack was 2 weeks ago and the one  before that was 5 to 6 months ago.  Started on Solu-Medrol, bronchodilators.  Also started on ceftriaxone. Continue current medications  Acute hypoxic respiratory failure: Currently on 2 L of oxygen.  Will continue to taper and wean on room air.  Not on oxygen at home  Uncontrolled diabetes type 2: Takes insulin ,semaglutide,synjardy  at home.  Continue current insulin regimen, monitor blood sugars.  Diabetic coordinator following.  Hypertension: Continue current antihypertensives.Monitor blood pressure  Hyperlipidemia: Continue statin  Anxiety/depression: Continue Zoloft, trazodone,buspirone  Diabetic neuropathy: On Neurontin  Leukocytosis: Most likely secondary to steroids, is improving  Tobacco use: Counseled  cessation       DVT prophylaxis:Place TED hose Start: 12/01/22 1202 Place TED hose Start: 12/01/22 1201 enoxaparin (LOVENOX) injection 40 mg Start: 12/01/22 1000     Code Status: Full Code  Family Communication: None at bedside  Patient status:Obs  Patient is from :Home  Anticipated discharge [id number]  Estimated DC date:tomorrow   Consultants: None  Procedures:None  Antimicrobials:  Anti-infectives (From admission, onward)    Start     Dose/Rate Route Frequency Ordered Stop   12/01/22 0030  cefTRIAXone (ROCEPHIN) 1 g in sodium chloride 0.9 % 100 mL IVPB        1 g 200 mL/hr over 30 Minutes Intravenous Every 24 hours 11/30/22 2341         Subjective: Patient seen and examined at bedside today ,hemodynamically stable.  Comfortable.  Feels better but still has some cough.  Wheezing.  On 2 L of oxygen per minute  Objective: Vitals:   12/01/22 0037 12/01/22 0426 12/01/22 0814 12/01/22 1130  BP: 119/69 111/70 116/64   Pulse: (!) 118 (!) 101 (!) 103   Resp: 18 18 18   $ Temp: 97.9 F (36.6 C) 98 F (36.7 C)    TempSrc:  Oral    SpO2: 92% 93% 98% 96%    Intake/Output Summary (Last 24 hours) at 12/01/2022 1333 Last data filed at 12/01/2022 0500 Gross per 24 hour  Intake 667.72 ml  Output --  Net 667.72 ml   There were no vitals filed for this visit.  Examination:  General exam: Overall comfortable, not in distress HEENT: PERRL Respiratory system: Bilateral wheezing Cardiovascular system: S1 & S2 heard, RRR.  Gastrointestinal system: Abdomen is nondistended, soft and nontender. Central nervous system: Alert and oriented Extremities: No edema,  no clubbing ,no cyanosis Skin: No rashes, no ulcers,no icterus     Data Reviewed: I have personally reviewed following labs and imaging studies  CBC: Recent Labs  Lab 11/30/22 1452 12/01/22 0002  WBC 18.1* 12.9*  HGB 13.9 13.5  HCT 41.2 39.6  MCV 90.0 91.0  PLT 306 [id number]   Basic Metabolic Panel: Recent Labs   Lab 11/30/22 1452 12/01/22 0002  NA 134* 131*  K 4.5 4.7  CL 97* 94*  CO2 29 24  GLUCOSE 275* 461*  BUN 6 10  CREATININE 0.54 0.69  CALCIUM 9.4 8.7*     Recent Results (from the past 240 hour(s))  Resp panel by RT-PCR (RSV, Flu A&B, Covid) Anterior Nasal Swab     Status: None   Collection Time: 11/30/22  2:52 PM   Specimen: Anterior Nasal Swab  Result Value Ref Range Status   SARS Coronavirus 2 by RT PCR NEGATIVE NEGATIVE Final    Comment: (NOTE) SARS-CoV-2 target nucleic acids are NOT DETECTED.  The SARS-CoV-2 RNA is generally detectable in upper respiratory specimens during the acute phase of infection. The lowest concentration of SARS-CoV-2 viral copies this assay can detect is 138 copies/mL. A negative result does not preclude SARS-Cov-2 infection and should not be used as the sole basis for treatment or other patient management decisions. A negative result may occur with  improper specimen collection/handling, submission of specimen other than nasopharyngeal swab, presence of viral mutation(s) within the areas targeted by this assay, and inadequate number of viral copies(<138 copies/mL). A negative result must be combined with clinical observations, patient history, and epidemiological information. The expected result is Negative.  Fact Sheet for Patients:  EntrepreneurPulse.com.au  Fact Sheet for Healthcare Providers:  IncredibleEmployment.be  This test is no t yet approved or cleared by the [place] FDA and  has been authorized for detection and/or diagnosis of SARS-CoV-2 by FDA under an Emergency Use Authorization (EUA). This EUA will remain  in effect (meaning this test can be used) for the duration of the COVID-19 declaration under Section 564(b)(1) of the Act, 21 U.S.C.section 360bbb-3(b)(1), unless the authorization is terminated  or revoked sooner.       Influenza A by PCR NEGATIVE NEGATIVE Final   Influenza B  by PCR NEGATIVE NEGATIVE Final    Comment: (NOTE) The Xpert Xpress SARS-CoV-2/FLU/RSV plus assay is intended as an aid in the diagnosis of influenza from Nasopharyngeal swab specimens and should not be used as a sole basis for treatment. Nasal washings and aspirates are unacceptable for Xpert Xpress SARS-CoV-2/FLU/RSV testing.  Fact Sheet for Patients: EntrepreneurPulse.com.au  Fact Sheet for Healthcare Providers: IncredibleEmployment.be  This test is not yet approved or cleared by the [place] FDA and has been authorized for detection and/or diagnosis of SARS-CoV-2 by FDA under an Emergency Use Authorization (EUA). This EUA will remain in effect (meaning this test can be used) for the duration of the COVID-19 declaration under Section 564(b)(1) of the Act, 21 U.S.C. section 360bbb-3(b)(1), unless the authorization is terminated or revoked.     Resp Syncytial Virus by PCR NEGATIVE NEGATIVE Final    Comment: (NOTE) Fact Sheet for Patients: EntrepreneurPulse.com.au  Fact Sheet for Healthcare Providers: IncredibleEmployment.be  This test is not yet approved or cleared by the [place] FDA and has been authorized for detection and/or diagnosis of SARS-CoV-2 by FDA under an Emergency Use Authorization (EUA). This EUA will remain in effect (meaning this test can be used) for the duration of the COVID-19 declaration  under Section 564(b)(1) of the Act, 21 U.S.C. section 360bbb-3(b)(1), unless the authorization is terminated or revoked.  Performed at [facility], [address], [place], [place] [address]      Radiology Studies: US Venous Img Lower Bilateral (DVT)  Result Date: 11/30/2022 CLINICAL DATA:  Bilateral leg swelling, right greater than left. EXAM: BILATERAL LOWER EXTREMITY VENOUS DOPPLER ULTRASOUND TECHNIQUE: Gray-scale sonography with compression, as well as color and  duplex ultrasound, were performed to evaluate the deep venous system(s) from the level of the common femoral vein through the popliteal and proximal calf veins. COMPARISON:  None Available. FINDINGS: VENOUS Normal compressibility of the common femoral, superficial femoral, and popliteal veins, as well as the visualized calf veins. Visualized portions of profunda femoral vein and great saphenous vein unremarkable. No filling defects to suggest DVT on grayscale or color Doppler imaging. Doppler waveforms show normal direction of venous flow, normal respiratory plasticity and response to augmentation. OTHER None. Limitations: none IMPRESSION: 1. No sonographic findings of deep vein thrombosis in either lower extremity. Electronically Signed   By: Van Clines M.D.   On: 11/30/2022 15:49   DG Chest 2 View  Result Date: 11/30/2022 CLINICAL DATA:  Shortness of breath, chest congestion and wheezing. EXAM: CHEST - 2 VIEW COMPARISON:  11/14/2022 FINDINGS: Heart size is normal. Mediastinal shadows are normal. There appears to be bronchial thickening consistent with bronchitis. No consolidation, collapse or effusion. No abnormal bone finding. IMPRESSION: Bronchitis pattern. No consolidation or collapse. Electronically Signed   By: Nelson Chimes M.D.   On: 11/30/2022 13:36    Scheduled Meds:  amphetamine-dextroamphetamine  30 mg Oral Daily   ARIPiprazole  10 mg Oral q morning   aspirin EC  81 mg Oral Daily   benzonatate  100 mg Oral Q8H   busPIRone  15 mg Oral TID   enoxaparin (LOVENOX) injection  40 mg Subcutaneous Q24H   ezetimibe  10 mg Oral Daily   gabapentin  100 mg Oral q morning   gabapentin  600 mg Oral QHS   guaiFENesin  600 mg Oral BID   insulin aspart  0-20 Units Subcutaneous TID AC & HS   insulin aspart protamine- aspart  50 Units Subcutaneous BID WC   ipratropium-albuterol  3 mL Nebulization QID   lisinopril  2.5 mg Oral Daily   methylPREDNISolone (SOLU-MEDROL) injection  40 mg Intravenous  Q12H   Followed by   Derrill Memo ON 12/02/2022] predniSONE  40 mg Oral Q breakfast   multivitamin with minerals  1 tablet Oral q morning   omega-3 acid ethyl esters  1 g Oral Daily   pravastatin  80 mg Oral q morning   [START ON 12/03/2022] Semaglutide (1 MG/DOSE)  1 mg Subcutaneous Q Fri   sertraline  200 mg Oral Daily   traZODone  200 mg Oral QHS   Continuous Infusions:  cefTRIAXone (ROCEPHIN)  IV 1 g (12/01/22 0014)     LOS: 0 days   Shelly Coss, MD [facility] P2/21/2024, 1:33 PM

## 2022-12-01 NOTE — Progress Notes (Signed)
Chaplain engaged in an initial visit with [name].  Chaplain provided education on the Advanced Directive, Healthcare POA document.  [name] appointed her husband and mom as her healthcare agents.  She expressed a desire to have quality of life and if that is not present she does not desire to be kept alive.  [name] noted that she has had those important conversations with her husband and mom.    Chaplain offered support and set up for document to be witnessed and notarized.  Chaplain provided [name] with the original AD and two copies.  Chaplain input one in her medical chart and emailed a copy to ACP (Gregory).   Chaplain Chanz Cahall, MDiv  12/01/22 1100  Spiritual Encounters  Type of Visit Initial  Care provided to: Patient  Reason for visit Advance directives  Spiritual Framework  Presenting Themes Goals in life/care  Community/Connection Family  Interventions  Spiritual Care Interventions Made Decision-making support/facilitation;Established relationship of care and support  Intervention Outcomes  Outcomes Connection to values and goals of care;Awareness of support

## 2022-12-01 NOTE — TOC Initial Note (Signed)
Transition of Care [facility]) - Initial/Assessment Note    Patient Details  Name: Archita Cathell MRN: [id number] Date of Birth: [birth year]  [facility] of [facility]) CM/SW Contact:    Ninfa Meeker, RN Phone Number: 12/01/2022, 11:20 AM  Clinical Narrative:                 Transition of Care Screening Note:  Transition of Care [facility]) Department has reviewed patient and no TOC needs have been identified at this time. We will continue to monitor patient advancement through Interdisciplinary progressions and if new patient needs arise, please place a consult.        Patient Goals and CMS Choice            Expected Discharge Plan and Services                                              Prior Living Arrangements/Services                       Activities of Daily Living Home Assistive Devices/Equipment: None ADL Screening (condition at time of admission) Patient's cognitive ability adequate to safely complete daily activities?: Yes Is the patient deaf or have difficulty hearing?: No Does the patient have difficulty seeing, even when wearing glasses/contacts?: No Does the patient have difficulty concentrating, remembering, or making decisions?: No Patient able to express need for assistance with ADLs?: Yes Does the patient have difficulty dressing or bathing?: No Independently performs ADLs?: Yes (appropriate for developmental age) Does the patient have difficulty walking or climbing stairs?: No Weakness of Legs: None Weakness of Arms/Hands: None  Permission Sought/Granted                  Emotional Assessment              Admission diagnosis:  Asthma exacerbation [J45.901] Dyspnea, unspecified type [R06.00] Exacerbation of asthma, unspecified asthma severity, unspecified whether persistent [J45.901] Patient Active Problem List   Diagnosis Date Noted   Essential hypertension 11/30/2022   Anxiety and depression 11/30/2022    Diabetic neuropathy ([place]) 11/30/2022   Acute bronchitis 08/13/2022   Acute severe exacerbation of moderate persistent asthma 08/12/2022   Moderate persistent asthma 12/02/2021   Diaphoresis 12/02/2021   Altered mental status 12/02/2021   Respiratory failure with hypoxia ([place]) 03/09/2021   Diabetes mellitus without complication ([place])    Type 2 diabetes mellitus with hyperglycemia ([place]) 09/22/2020   Protrusion of lumbar intervertebral disc 03/31/2020   Obesity (BMI 30.0-34.9) 04/11/2019   Neuropathy 04/11/2019   Dyslipidemia 04/11/2019   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   Uncontrolled type 2 diabetes mellitus with hyperglycemia, with long-term current use of insulin ([place]) 04/11/2019   Overdose of antipsychotic 01/23/2017   Hypoxia 01/17/2017   Diabetes 1.5, 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/17/2017   Bipolar I disorder, most recent episode depressed ([place]) 06/11/2013   Depression with suicidal ideation 06/09/2013   Alcohol abuse, episodic drinking behavior 06/09/2013   Severe episode of recurrent major depressive disorder, with psychotic features ([place]) 06/09/2013   Benzodiazepine-based tranquilizers causing adverse effect in therapeutic use 10/19/2011   SNORING 07/18/2009   Ovarian cyst 06/13/2009   HYPERCHOLESTEROLEMIA 09/19/2008   Back pain 09/18/2008   Tobacco use 08/13/2008   Insomnia 08/13/2008   PCP:  Jettie Booze, NP Pharmacy:  [place], [place] - [address] [place] [address] Phone: [phone] Fax: [place], [place] - [address] [place] [address] [place] [address] Phone: [phone] Fax: [phone]  [facility] [address] [place] [address] Phone: [phone] Fax: [place] [address] Phone: [phone] Fax: [phone]  [facility] [place], [place] [address] Phone: [phone] Fax: [phone]     Social Determinants of Health (SDOH) Social History: SDOH Screenings   Food Insecurity: No Food Insecurity (12/01/2022)  Housing: Low Risk  (12/01/2022)  Transportation Needs: No Transportation Needs (12/01/2022)  Utilities: Not At Risk (12/01/2022)  Tobacco Use: High Risk (11/30/2022)   SDOH Interventions:     Readmission Risk Interventions     No data to display

## 2022-12-02 ENCOUNTER — Other Ambulatory Visit (HOSPITAL_COMMUNITY): Payer: Self-pay

## 2022-12-02 DIAGNOSIS — J4541 Moderate persistent asthma with (acute) exacerbation: Secondary | ICD-10-CM | POA: Diagnosis not present

## 2022-12-02 LAB — GLUCOSE, CAPILLARY
Glucose-Capillary: 231 mg/dL — ABNORMAL HIGH (ref 70–99)
Glucose-Capillary: 218 mg/dL — ABNORMAL HIGH (ref 70–99)

## 2022-12-02 MED ORDER — BUDESONIDE 0.5 MG/2ML IN SUSP: 0.5000 mg | Freq: Two times a day (BID) | RESPIRATORY_TRACT | Status: DC

## 2022-12-02 MED ORDER — PREDNISONE 10 MG PO TABS
10.0000 mg | ORAL_TABLET | Freq: Every day | ORAL | 0 refills | Status: DC
  Filled 2022-12-02: qty 30, 12d supply, fill #0

## 2022-12-02 MED ORDER — ALBUTEROL SULFATE HFA 108 (90 BASE) MCG/ACT IN AERS
1.0000 | INHALATION_SPRAY | Freq: Four times a day (QID) | RESPIRATORY_TRACT | 1 refills | Status: AC | PRN
  Filled 2022-12-02: qty 6.7, 25d supply, fill #0

## 2022-12-02 MED ORDER — METHYLPREDNISOLONE SODIUM SUCC 125 MG IJ SOLR: 60.0000 mg | Freq: Two times a day (BID) | INTRAMUSCULAR | Status: DC

## 2022-12-02 MED ORDER — IPRATROPIUM-ALBUTEROL 0.5-2.5 (3) MG/3ML IN SOLN: 3.0000 mL | Freq: Three times a day (TID) | RESPIRATORY_TRACT | Status: DC

## 2022-12-02 MED ORDER — HYDROCOD POLI-CHLORPHE POLI ER 10-8 MG/5ML PO SUER
5.0000 mL | Freq: Two times a day (BID) | ORAL | 0 refills | Status: AC | PRN
  Filled 2022-12-02: qty 70, 7d supply, fill #0

## 2022-12-02 MED ORDER — POLYETHYLENE GLYCOL 3350 17 G PO PACK
17.0000 g | PACK | Freq: Every day | ORAL | Status: DC
  Administered 2022-12-02: 17 g via ORAL
  Filled 2022-12-02: qty 1

## 2022-12-02 MED ORDER — FLEET ENEMA 7-19 GM/118ML RE ENEM
1.0000 | ENEMA | Freq: Once | RECTAL | Status: AC
  Administered 2022-12-02: 1 via RECTAL
  Filled 2022-12-02: qty 1

## 2022-12-02 MED ORDER — CEFDINIR 300 MG PO CAPS
300.0000 mg | ORAL_CAPSULE | Freq: Two times a day (BID) | ORAL | 0 refills | Status: AC
  Filled 2022-12-02: qty 6, 3d supply, fill #0

## 2022-12-02 NOTE — Progress Notes (Signed)
Informed the patient that MD will not discharge her today because her HR is running in the 120s. Pt says she really wants to go home and HR will be in the 120s due to steroids and breathing treatments. Pt also desire to go AMA if MD won't discharge her. Tawanna Solo, MD aware.

## 2022-12-02 NOTE — Discharge Summary (Signed)
Physician Discharge Summary  [name] [id number] DOB: 11-13-1980 DOA: 11/30/2022  PCP: Jettie Booze, NP  Admit date: 11/30/2022 Discharge date: 12/02/2022  Admitted From: Home Disposition:  Home  Discharge Condition:Stable CODE STATUS:FULL Diet recommendation: Heart Healthy  Brief/Interim Summary: Patient is a 42 year old female with history of intermittent asthma, tobacco use, hyperlipidemia, peripheral neuropathy, diabetes type 2, OCD, chronic pain syndrome who presented with shortness of breath, cough with yellow-green sputum, wheezing.  She was recently seen at [place] at [facility] for the same complaints and was prescribed prednisone and inhalers but it did not help.  On presentation, lab work showed WBC count of 18.1, COVID/flu/RSV negative.  Chest x-ray showed features of bronchitis.  Patient was started on IV steroids, antibiotics.  Overall status has improved.  She has been weaned to room air today.  She still has some mild wheezing.  She insisted on going home today.  Being discharged with oral prednisone on tapering dose.  Following problems were addressed during the hospitalization:  Acute severe exacerbation of mild intermittent asthma: Asthma attacks very infrequent.  She says she gets attack when she falls sick.  Last attack was 2 weeks ago and the one  before that was 5 to 6 months ago.  Started on Solu-Medrol, bronchodilators.  Also started on ceftriaxone. She feels better today.  Steroids changed to oral.  Antibiotics changed to oral   Acute hypoxic respiratory failure: Weaned to room air  Uncontrolled diabetes type 2: Takes insulin ,semaglutide,synjardy  at home.  Continue current regimen.   Hypertension: Continue current antihypertensives.   Hyperlipidemia: Continue statin   Anxiety/depression: Continue Zoloft, trazodone,buspirone   Diabetic neuropathy: On Neurontin   Leukocytosis: Most likely secondary to steroids, improved   Tobacco  use: Counseled cessation   Discharge Diagnoses:  Principal Problem:   Acute severe exacerbation of moderate persistent asthma Active Problems:   Uncontrolled type 2 diabetes mellitus with hyperglycemia, with long-term current use of insulin (HCC)   Essential hypertension   Dyslipidemia   Anxiety and depression   Diabetic neuropathy (HCC)   Tobacco use   Acute asthma exacerbation    Discharge Instructions  Discharge Instructions     Diet - low sodium heart healthy   Complete by: As directed    Discharge instructions   Complete by: As directed    1)Please take prescribed medications as instructed 2)Follow up with your PCP in a week   Increase activity slowly   Complete by: As directed       Allergies as of 12/02/2022       Reactions   Orange Oil Shortness Of Breath   Penicillins Itching, Rash   Carisoprodol Other (See Comments)   Skin peeled   Piroxicam Other (See Comments)   Skin peeled        Medication List     STOP taking these medications    benzonatate 100 MG capsule Commonly known as: TESSALON       TAKE these medications    acetaminophen 500 MG tablet Commonly known as: TYLENOL Take 1 tablet (500 mg total) by mouth every 6 (six) hours as needed. What changed: reasons to take this   albuterol (2.5 MG/3ML) 0.083% nebulizer solution Commonly known as: PROVENTIL Take 2.5 mg by nebulization every 6 (six) hours as needed for wheezing or shortness of breath.   albuterol 108 (90 Base) MCG/ACT inhaler Commonly known as: VENTOLIN HFA Inhale 1-2 puffs into the lungs every 6 (six) hours as needed for wheezing or  shortness of breath.   amphetamine-dextroamphetamine 30 MG tablet Commonly known as: ADDERALL Take 30 mg by mouth daily.   ARIPiprazole 10 MG tablet Commonly known as: ABILIFY Take 10 mg by mouth every morning.   aspirin EC 81 MG tablet Take 81 mg by mouth daily. Swallow whole.   BD Pen Needle Nano 2nd Gen 32G X 4 MM Misc Generic  drug: Insulin Pen Needle daily.   Breo Ellipta 50-25 MCG/ACT Aepb Generic drug: Fluticasone Furoate-Vilanterol Inhale 1 puff into the lungs daily in the afternoon.   busPIRone 15 MG tablet Commonly known as: BUSPAR Take 15 mg by mouth 3 (three) times daily.   cefdinir 300 MG capsule Commonly known as: OMNICEF Take 1 capsule (300 mg total) by mouth 2 (two) times daily for 3 days. Start taking on: December 03, 2022   chlorpheniramine-HYDROcodone 10-8 MG/5ML Commonly known as: TUSSIONEX Take 5 mLs by mouth every 12 (twelve) hours as needed for up to 5 days for cough.   ezetimibe 10 MG tablet Commonly known as: Zetia Take 1 tablet (10 mg total) by mouth daily.   FISH OIL PO Take 1 capsule by mouth every morning.   FreeStyle Libre 3 Sensor Misc 1 each every 14 (fourteen) days.   gabapentin 300 MG capsule Commonly known as: NEURONTIN Take 1-2 capsules (300-600 mg total) by mouth at bedtime. Only take as prescribed.  Husband is to administer medication What changed:  how much to take additional instructions   gabapentin 100 MG capsule Commonly known as: NEURONTIN Take 1 capsule (100 mg total) by mouth daily after breakfast. What changed: when to take this   lisinopril 2.5 MG tablet Commonly known as: ZESTRIL Take 1 tablet (2.5 mg total) by mouth daily.   multivitamin with minerals Tabs tablet Take 1 tablet by mouth every morning.   NovoLOG Mix 70/30 FlexPen (70-30) 100 UNIT/ML FlexPen Generic drug: insulin aspart protamine - aspart Inject 50 Units into the skin 2 (two) times daily.   Ozempic (1 MG/DOSE) 4 MG/3ML Sopn Generic drug: Semaglutide (1 MG/DOSE) Inject 2 mg into the skin once a week. Friday   pravastatin 80 MG tablet Commonly known as: PRAVACHOL Take 1 tablet (80 mg total) by mouth daily. What changed: when to take this   predniSONE 10 MG tablet Commonly known as: DELTASONE Take 1 tablet (10 mg total) by mouth daily. What changed: how much to take    sertraline 100 MG tablet Commonly known as: ZOLOFT Take 2 tablets (200 mg total) by mouth daily. For depression and anxiety.   Synjardy 12.02-999 MG Tabs Generic drug: Empagliflozin-metFORMIN HCl Take 1 tablet by mouth 2 (two) times daily.   traZODone 100 MG tablet Commonly known as: DESYREL Take 200 mg by mouth at bedtime.        Follow-up Information     Jettie Booze, NP. Schedule an appointment as soon as possible for a visit in 1 week(s).   Specialty: Family Medicine Contact information: [place] [address] [place] [address] [phone]                Allergies  Allergen Reactions   Orange Oil Shortness Of Breath   Penicillins Itching and Rash   Carisoprodol Other (See Comments)    Skin peeled    Piroxicam Other (See Comments)    Skin peeled    Consultations: None   Procedures/Studies: US Venous Img Lower Bilateral (DVT)  Result Date: 11/30/2022 CLINICAL DATA:  Bilateral leg swelling, right greater  than left. EXAM: BILATERAL LOWER EXTREMITY VENOUS DOPPLER ULTRASOUND TECHNIQUE: Gray-scale sonography with compression, as well as color and duplex ultrasound, were performed to evaluate the deep venous system(s) from the level of the common femoral vein through the popliteal and proximal calf veins. COMPARISON:  None Available. FINDINGS: VENOUS Normal compressibility of the common femoral, superficial femoral, and popliteal veins, as well as the visualized calf veins. Visualized portions of profunda femoral vein and great saphenous vein unremarkable. No filling defects to suggest DVT on grayscale or color Doppler imaging. Doppler waveforms show normal direction of venous flow, normal respiratory plasticity and response to augmentation. OTHER None. Limitations: none IMPRESSION: 1. No sonographic findings of deep vein thrombosis in either lower extremity. Electronically Signed   By: Van Clines M.D.   On: 11/30/2022 15:49   DG Chest 2 View  Result  Date: 11/30/2022 CLINICAL DATA:  Shortness of breath, chest congestion and wheezing. EXAM: CHEST - 2 VIEW COMPARISON:  11/14/2022 FINDINGS: Heart size is normal. Mediastinal shadows are normal. There appears to be bronchial thickening consistent with bronchitis. No consolidation, collapse or effusion. No abnormal bone finding. IMPRESSION: Bronchitis pattern. No consolidation or collapse. Electronically Signed   By: Nelson Chimes M.D.   On: 11/30/2022 13:36   DG Chest 2 View  Result Date: 11/14/2022 CLINICAL DATA:  Shortness of breath cough EXAM: CHEST - 2 VIEW COMPARISON:  Chest radiograph dated August 13, 2022 FINDINGS: The heart size and mediastinal contours are within normal limits. Both lungs are clear. The visualized skeletal structures are unremarkable. IMPRESSION: No active cardiopulmonary disease. Electronically Signed   By: Keane Police D.O.   On: 11/14/2022 21:51      Subjective: Patient seen and examined at bedside today.  Hemodynamically stable.  We were able to wean her to room air with ambulation.  Since she has mild sinus tachycardia after ambulation.  She is really interested to go home and does not want to stay another day  Discharge Exam: Vitals:   12/02/22 0500 12/02/22 1148  BP: 113/76   Pulse: 87   Resp: 17   Temp: 98.1 F (36.7 C)   SpO2: 93% 97%   Vitals:   12/01/22 2159 12/02/22 0500 12/02/22 1148 12/02/22 1226  BP: 130/80 113/76    Pulse: (!) 108 87    Resp: 16 17    Temp: 98 F (36.7 C) 98.1 F (36.7 C)    TempSrc:  Oral    SpO2: 98% 93% 97%   Weight:    86 kg  Height:    5' 6"$  (1.676 m)    General: Pt is alert, awake, not in acute distress Cardiovascular: RRR, S1/S2 +, no rubs, no gallops Respiratory: mild expiratory wheezing bilaterally Abdominal: Soft, NT, ND, bowel sounds + Extremities: no edema, no cyanosis    The results of significant diagnostics from this hospitalization (including imaging, microbiology, ancillary and laboratory) are listed  below for reference.     Microbiology: Recent Results (from the past 240 hour(s))  Resp panel by RT-PCR (RSV, Flu A&B, Covid) Anterior Nasal Swab     Status: None   Collection Time: 11/30/22  2:52 PM   Specimen: Anterior Nasal Swab  Result Value Ref Range Status   SARS Coronavirus 2 by RT PCR NEGATIVE NEGATIVE Final    Comment: (NOTE) SARS-CoV-2 target nucleic acids are NOT DETECTED.  The SARS-CoV-2 RNA is generally detectable in upper respiratory specimens during the acute phase of infection. The lowest concentration of SARS-CoV-2 viral copies this  assay can detect is 138 copies/mL. A negative result does not preclude SARS-Cov-2 infection and should not be used as the sole basis for treatment or other patient management decisions. A negative result may occur with  improper specimen collection/handling, submission of specimen other than nasopharyngeal swab, presence of viral mutation(s) within the areas targeted by this assay, and inadequate number of viral copies(<138 copies/mL). A negative result must be combined with clinical observations, patient history, and epidemiological information. The expected result is Negative.  Fact Sheet for Patients:  EntrepreneurPulse.com.au  Fact Sheet for Healthcare Providers:  IncredibleEmployment.be  This test is no t yet approved or cleared by the [place] FDA and  has been authorized for detection and/or diagnosis of SARS-CoV-2 by FDA under an Emergency Use Authorization (EUA). This EUA will remain  in effect (meaning this test can be used) for the duration of the COVID-19 declaration under Section 564(b)(1) of the Act, 21 U.S.C.section 360bbb-3(b)(1), unless the authorization is terminated  or revoked sooner.       Influenza A by PCR NEGATIVE NEGATIVE Final   Influenza B by PCR NEGATIVE NEGATIVE Final    Comment: (NOTE) The Xpert Xpress SARS-CoV-2/FLU/RSV plus assay is intended as an aid in  the diagnosis of influenza from Nasopharyngeal swab specimens and should not be used as a sole basis for treatment. Nasal washings and aspirates are unacceptable for Xpert Xpress SARS-CoV-2/FLU/RSV testing.  Fact Sheet for Patients: EntrepreneurPulse.com.au  Fact Sheet for Healthcare Providers: IncredibleEmployment.be  This test is not yet approved or cleared by the [place] FDA and has been authorized for detection and/or diagnosis of SARS-CoV-2 by FDA under an Emergency Use Authorization (EUA). This EUA will remain in effect (meaning this test can be used) for the duration of the COVID-19 declaration under Section 564(b)(1) of the Act, 21 U.S.C. section 360bbb-3(b)(1), unless the authorization is terminated or revoked.     Resp Syncytial Virus by PCR NEGATIVE NEGATIVE Final    Comment: (NOTE) Fact Sheet for Patients: EntrepreneurPulse.com.au  Fact Sheet for Healthcare Providers: IncredibleEmployment.be  This test is not yet approved or cleared by the [place] FDA and has been authorized for detection and/or diagnosis of SARS-CoV-2 by FDA under an Emergency Use Authorization (EUA). This EUA will remain in effect (meaning this test can be used) for the duration of the COVID-19 declaration under Section 564(b)(1) of the Act, 21 U.S.C. section 360bbb-3(b)(1), unless the authorization is terminated or revoked.  Performed at [facility], [address] [place], [place] [address]      Labs: BNP (last 3 results) Recent Labs    12/02/21 1800  BNP [id number]   Basic Metabolic Panel: Recent Labs  Lab 11/30/22 1452 12/01/22 0002  NA 134* 131*  K 4.5 4.7  CL 97* 94*  CO2 29 24  GLUCOSE 275* 461*  BUN 6 10  CREATININE 0.54 0.69  CALCIUM 9.4 8.7*   Liver Function Tests: No results for input(s): "AST", "ALT", "ALKPHOS", "BILITOT", "PROT", "ALBUMIN" in the last 168  hours. No results for input(s): "LIPASE", "AMYLASE" in the last 168 hours. No results for input(s): "AMMONIA" in the last 168 hours. CBC: Recent Labs  Lab 11/30/22 1452 12/01/22 0002  WBC 18.1* 12.9*  HGB 13.9 13.5  HCT 41.2 39.6  MCV 90.0 91.0  PLT 306 299   Cardiac Enzymes: No results for input(s): "CKTOTAL", "CKMB", "CKMBINDEX", "TROPONINI" in the last 168 hours. BNP: Invalid input(s): "POCBNP" CBG: Recent Labs  Lab 12/01/22 1159 12/01/22 1715 12/01/22 2154  12/02/22 0758 12/02/22 1141  GLUCAP 317* 295* 170* 231* 218*   D-Dimer Recent Labs    11/30/22 1452  DDIMER <0.27   Hgb A1c Recent Labs    12/01/22 0002  HGBA1C 9.4*   Lipid Profile No results for input(s): "CHOL", "HDL", "LDLCALC", "TRIG", "CHOLHDL", "LDLDIRECT" in the last 72 hours. Thyroid function studies No results for input(s): "TSH", "T4TOTAL", "T3FREE", "THYROIDAB" in the last 72 hours.  Invalid input(s): "FREET3" Anemia work up No results for input(s): "VITAMINB12", "FOLATE", "FERRITIN", "TIBC", "IRON", "RETICCTPCT" in the last 72 hours. Urinalysis    Component Value Date/Time   COLORURINE STRAW (A) 02/18/2021 [place] 02/18/2021 0137   LABSPEC 1.030 02/18/2021 0137   PHURINE 6.0 02/18/2021 0137   GLUCOSEU >=500 (A) 02/18/2021 0137   HGBUR NEGATIVE 02/18/2021 0137   HGBUR negative 12/26/2008 [place] 02/18/2021 0137   KETONESUR NEGATIVE 02/18/2021 0137   PROTEINUR NEGATIVE 02/18/2021 0137   UROBILINOGEN 0.2 01/09/2018 1114   NITRITE NEGATIVE 02/18/2021 0137   LEUKOCYTESUR NEGATIVE 02/18/2021 0137   Sepsis Labs Recent Labs  Lab 11/30/22 1452 12/01/22 0002  WBC 18.1* 12.9*   Microbiology Recent Results (from the past 240 hour(s))  Resp panel by RT-PCR (RSV, Flu A&B, Covid) Anterior Nasal Swab     Status: None   Collection Time: 11/30/22  2:52 PM   Specimen: Anterior Nasal Swab  Result Value Ref Range Status   SARS Coronavirus 2 by RT PCR  NEGATIVE NEGATIVE Final    Comment: (NOTE) SARS-CoV-2 target nucleic acids are NOT DETECTED.  The SARS-CoV-2 RNA is generally detectable in upper respiratory specimens during the acute phase of infection. The lowest concentration of SARS-CoV-2 viral copies this assay can detect is 138 copies/mL. A negative result does not preclude SARS-Cov-2 infection and should not be used as the sole basis for treatment or other patient management decisions. A negative result may occur with  improper specimen collection/handling, submission of specimen other than nasopharyngeal swab, presence of viral mutation(s) within the areas targeted by this assay, and inadequate number of viral copies(<138 copies/mL). A negative result must be combined with clinical observations, patient history, and epidemiological information. The expected result is Negative.  Fact Sheet for Patients:  EntrepreneurPulse.com.au  Fact Sheet for Healthcare Providers:  IncredibleEmployment.be  This test is no t yet approved or cleared by the [place] FDA and  has been authorized for detection and/or diagnosis of SARS-CoV-2 by FDA under an Emergency Use Authorization (EUA). This EUA will remain  in effect (meaning this test can be used) for the duration of the COVID-19 declaration under Section 564(b)(1) of the Act, 21 U.S.C.section 360bbb-3(b)(1), unless the authorization is terminated  or revoked sooner.       Influenza A by PCR NEGATIVE NEGATIVE Final   Influenza B by PCR NEGATIVE NEGATIVE Final    Comment: (NOTE) The Xpert Xpress SARS-CoV-2/FLU/RSV plus assay is intended as an aid in the diagnosis of influenza from Nasopharyngeal swab specimens and should not be used as a sole basis for treatment. Nasal washings and aspirates are unacceptable for Xpert Xpress SARS-CoV-2/FLU/RSV testing.  Fact Sheet for Patients: EntrepreneurPulse.com.au  Fact Sheet for  Healthcare Providers: IncredibleEmployment.be  This test is not yet approved or cleared by the [place] FDA and has been authorized for detection and/or diagnosis of SARS-CoV-2 by FDA under an Emergency Use Authorization (EUA). This EUA will remain in effect (meaning this test can be used) for the duration of the COVID-19 declaration  under Section 564(b)(1) of the Act, 21 U.S.C. section 360bbb-3(b)(1), unless the authorization is terminated or revoked.     Resp Syncytial Virus by PCR NEGATIVE NEGATIVE Final    Comment: (NOTE) Fact Sheet for Patients: EntrepreneurPulse.com.au  Fact Sheet for Healthcare Providers: IncredibleEmployment.be  This test is not yet approved or cleared by the [place] FDA and has been authorized for detection and/or diagnosis of SARS-CoV-2 by FDA under an Emergency Use Authorization (EUA). This EUA will remain in effect (meaning this test can be used) for the duration of the COVID-19 declaration under Section 564(b)(1) of the Act, 21 U.S.C. section 360bbb-3(b)(1), unless the authorization is terminated or revoked.  Performed at [facility], [address] [place], [place] [address]     Please note: You were cared for by a hospitalist during your hospital stay. Once you are discharged, your primary care physician will handle any further medical issues. Please note that NO REFILLS for any discharge medications will be authorized once you are discharged, as it is imperative that you return to your primary care physician (or establish a relationship with a primary care physician if you do not have one) for your post hospital discharge needs so that they can reassess your need for medications and monitor your lab values.    Time coordinating discharge: 40 minutes  SIGNED:   Shelly Coss, MD  [facility] 12/02/2022, 1:24 PM Pager [phone]  If 7PM-7AM,  please contact night-coverage [email or web address] Password [email or web address]

## 2022-12-02 NOTE — Plan of Care (Signed)
  Problem: Education: Goal: Knowledge of General Education information will improve Description: Including pain rating scale, medication(s)/side effects and non-pharmacologic comfort measures Outcome: Adequate for Discharge   Problem: Health Behavior/Discharge Planning: Goal: Ability to manage health-related needs will improve Outcome: Adequate for Discharge   Problem: Clinical Measurements: Goal: Ability to maintain clinical measurements within normal limits will improve Outcome: Adequate for Discharge Goal: Will remain free from infection Outcome: Adequate for Discharge Goal: Diagnostic test results will improve Outcome: Adequate for Discharge Goal: Respiratory complications will improve Outcome: Adequate for Discharge Goal: Cardiovascular complication will be avoided Outcome: Adequate for Discharge   Problem: Activity: Goal: Risk for activity intolerance will decrease Outcome: Adequate for Discharge   Problem: Nutrition: Goal: Adequate nutrition will be maintained Outcome: Adequate for Discharge   Problem: Coping: Goal: Level of anxiety will decrease Outcome: Adequate for Discharge   Problem: Elimination: Goal: Will not experience complications related to bowel motility Outcome: Adequate for Discharge Goal: Will not experience complications related to urinary retention Outcome: Adequate for Discharge   Problem: Pain Managment: Goal: General experience of comfort will improve Outcome: Adequate for Discharge   Problem: Safety: Goal: Ability to remain free from injury will improve Outcome: Adequate for Discharge   Problem: Skin Integrity: Goal: Risk for impaired skin integrity will decrease Outcome: Adequate for Discharge   Problem: Education: Goal: Knowledge of disease or condition will improve Outcome: Adequate for Discharge Goal: Knowledge of the prescribed therapeutic regimen will improve Outcome: Adequate for Discharge Goal: Individualized Educational  Video(s) Outcome: Adequate for Discharge   Problem: Activity: Goal: Ability to tolerate increased activity will improve Outcome: Adequate for Discharge Goal: Will verbalize the importance of balancing activity with adequate rest periods Outcome: Adequate for Discharge   Problem: Respiratory: Goal: Ability to maintain a clear airway will improve Outcome: Adequate for Discharge Goal: Levels of oxygenation will improve Outcome: Adequate for Discharge Goal: Ability to maintain adequate ventilation will improve Outcome: Adequate for Discharge   Problem: Education: Goal: Ability to describe self-care measures that may prevent or decrease complications (Diabetes Survival Skills Education) will improve Outcome: Adequate for Discharge Goal: Individualized Educational Video(s) Outcome: Adequate for Discharge   Problem: Coping: Goal: Ability to adjust to condition or change in health will improve Outcome: Adequate for Discharge   Problem: Fluid Volume: Goal: Ability to maintain a balanced intake and output will improve Outcome: Adequate for Discharge   Problem: Health Behavior/Discharge Planning: Goal: Ability to identify and utilize available resources and services will improve Outcome: Adequate for Discharge Goal: Ability to manage health-related needs will improve Outcome: Adequate for Discharge   Problem: Metabolic: Goal: Ability to maintain appropriate glucose levels will improve Outcome: Adequate for Discharge   Problem: Nutritional: Goal: Maintenance of adequate nutrition will improve Outcome: Adequate for Discharge Goal: Progress toward achieving an optimal weight will improve Outcome: Adequate for Discharge   Problem: Skin Integrity: Goal: Risk for impaired skin integrity will decrease Outcome: Adequate for Discharge   Problem: Tissue Perfusion: Goal: Adequacy of tissue perfusion will improve Outcome: Adequate for Discharge

## 2022-12-02 NOTE — Plan of Care (Signed)
Problem: Education: Goal: Knowledge of General Education information will improve Description: Including pain rating scale, medication(s)/side effects and non-pharmacologic comfort measures 12/02/2022 1328 by Angelena Sole, RN Outcome: Completed/Met 12/02/2022 1327 by Angelena Sole, RN Outcome: Adequate for Discharge   Problem: Health Behavior/Discharge Planning: Goal: Ability to manage health-related needs will improve 12/02/2022 1328 by Angelena Sole, RN Outcome: Completed/Met 12/02/2022 1327 by Angelena Sole, RN Outcome: Adequate for Discharge   Problem: Clinical Measurements: Goal: Ability to maintain clinical measurements within normal limits will improve 12/02/2022 1328 by Angelena Sole, RN Outcome: Completed/Met 12/02/2022 1327 by Angelena Sole, RN Outcome: Adequate for Discharge Goal: Will remain free from infection 12/02/2022 1328 by Angelena Sole, RN Outcome: Completed/Met 12/02/2022 1327 by Angelena Sole, RN Outcome: Adequate for Discharge Goal: Diagnostic test results will improve 12/02/2022 1328 by Angelena Sole, RN Outcome: Completed/Met 12/02/2022 1327 by Angelena Sole, RN Outcome: Adequate for Discharge Goal: Respiratory complications will improve 12/02/2022 1328 by Angelena Sole, RN Outcome: Completed/Met 12/02/2022 1327 by Angelena Sole, RN Outcome: Adequate for Discharge Goal: Cardiovascular complication will be avoided 12/02/2022 1328 by Angelena Sole, RN Outcome: Completed/Met 12/02/2022 1327 by Angelena Sole, RN Outcome: Adequate for Discharge   Problem: Activity: Goal: Risk for activity intolerance will decrease 12/02/2022 1328 by Angelena Sole, RN Outcome: Completed/Met 12/02/2022 1327 by Angelena Sole, RN Outcome: Adequate for Discharge   Problem: Nutrition: Goal: Adequate nutrition will be maintained 12/02/2022 1328 by Angelena Sole,  RN Outcome: Completed/Met 12/02/2022 1327 by Angelena Sole, RN Outcome: Adequate for Discharge   Problem: Coping: Goal: Level of anxiety will decrease 12/02/2022 1328 by Angelena Sole, RN Outcome: Completed/Met 12/02/2022 1327 by Angelena Sole, RN Outcome: Adequate for Discharge   Problem: Elimination: Goal: Will not experience complications related to bowel motility 12/02/2022 1328 by Angelena Sole, RN Outcome: Completed/Met 12/02/2022 1327 by Angelena Sole, RN Outcome: Adequate for Discharge Goal: Will not experience complications related to urinary retention 12/02/2022 1328 by Angelena Sole, RN Outcome: Completed/Met 12/02/2022 1327 by Angelena Sole, RN Outcome: Adequate for Discharge   Problem: Pain Managment: Goal: General experience of comfort will improve 12/02/2022 1328 by Angelena Sole, RN Outcome: Completed/Met 12/02/2022 1327 by Angelena Sole, RN Outcome: Adequate for Discharge   Problem: Safety: Goal: Ability to remain free from injury will improve 12/02/2022 1328 by Angelena Sole, RN Outcome: Completed/Met 12/02/2022 1327 by Angelena Sole, RN Outcome: Adequate for Discharge   Problem: Skin Integrity: Goal: Risk for impaired skin integrity will decrease 12/02/2022 1328 by Angelena Sole, RN Outcome: Completed/Met 12/02/2022 1327 by Angelena Sole, RN Outcome: Adequate for Discharge   Problem: Education: Goal: Knowledge of disease or condition will improve 12/02/2022 1328 by Angelena Sole, RN Outcome: Completed/Met 12/02/2022 1327 by Angelena Sole, RN Outcome: Adequate for Discharge Goal: Knowledge of the prescribed therapeutic regimen will improve 12/02/2022 1328 by Angelena Sole, RN Outcome: Completed/Met 12/02/2022 1327 by Angelena Sole, RN Outcome: Adequate for Discharge Goal: Individualized Educational Video(s) 12/02/2022 1328 by Angelena Sole, RN Outcome: Completed/Met 12/02/2022 1327 by Angelena Sole, RN Outcome: Adequate for Discharge   Problem: Activity: Goal: Ability to tolerate increased activity will improve 12/02/2022 1328 by Angelena Sole, RN Outcome: Completed/Met 12/02/2022 1327 by Angelena Sole, RN Outcome: Adequate for Discharge Goal: Will verbalize the importance of balancing activity with adequate rest periods 12/02/2022 1328 by Jeanella Cara,  Abagail Kitchens, RN Outcome: Completed/Met 12/02/2022 1327 by Angelena Sole, RN Outcome: Adequate for Discharge   Problem: Respiratory: Goal: Ability to maintain a clear airway will improve 12/02/2022 1328 by Angelena Sole, RN Outcome: Completed/Met 12/02/2022 1327 by Angelena Sole, RN Outcome: Adequate for Discharge Goal: Levels of oxygenation will improve 12/02/2022 1328 by Angelena Sole, RN Outcome: Completed/Met 12/02/2022 1327 by Angelena Sole, RN Outcome: Adequate for Discharge Goal: Ability to maintain adequate ventilation will improve 12/02/2022 1328 by Angelena Sole, RN Outcome: Completed/Met 12/02/2022 1327 by Angelena Sole, RN Outcome: Adequate for Discharge   Problem: Education: Goal: Ability to describe self-care measures that may prevent or decrease complications (Diabetes Survival Skills Education) will improve 12/02/2022 1328 by Angelena Sole, RN Outcome: Completed/Met 12/02/2022 1327 by Angelena Sole, RN Outcome: Adequate for Discharge Goal: Individualized Educational Video(s) 12/02/2022 1328 by Angelena Sole, RN Outcome: Completed/Met 12/02/2022 1327 by Angelena Sole, RN Outcome: Adequate for Discharge   Problem: Coping: Goal: Ability to adjust to condition or change in health will improve 12/02/2022 1328 by Angelena Sole, RN Outcome: Completed/Met 12/02/2022 1327 by Angelena Sole, RN Outcome: Adequate for Discharge   Problem: Fluid  Volume: Goal: Ability to maintain a balanced intake and output will improve 12/02/2022 1328 by Angelena Sole, RN Outcome: Completed/Met 12/02/2022 1327 by Angelena Sole, RN Outcome: Adequate for Discharge   Problem: Health Behavior/Discharge Planning: Goal: Ability to identify and utilize available resources and services will improve 12/02/2022 1328 by Angelena Sole, RN Outcome: Completed/Met 12/02/2022 1327 by Angelena Sole, RN Outcome: Adequate for Discharge Goal: Ability to manage health-related needs will improve 12/02/2022 1328 by Angelena Sole, RN Outcome: Completed/Met 12/02/2022 1327 by Angelena Sole, RN Outcome: Adequate for Discharge   Problem: Metabolic: Goal: Ability to maintain appropriate glucose levels will improve 12/02/2022 1328 by Angelena Sole, RN Outcome: Completed/Met 12/02/2022 1327 by Angelena Sole, RN Outcome: Adequate for Discharge   Problem: Nutritional: Goal: Maintenance of adequate nutrition will improve 12/02/2022 1328 by Angelena Sole, RN Outcome: Completed/Met 12/02/2022 1327 by Angelena Sole, RN Outcome: Adequate for Discharge Goal: Progress toward achieving an optimal weight will improve 12/02/2022 1328 by Angelena Sole, RN Outcome: Completed/Met 12/02/2022 1327 by Angelena Sole, RN Outcome: Adequate for Discharge   Problem: Skin Integrity: Goal: Risk for impaired skin integrity will decrease 12/02/2022 1328 by Angelena Sole, RN Outcome: Completed/Met 12/02/2022 1327 by Angelena Sole, RN Outcome: Adequate for Discharge   Problem: Tissue Perfusion: Goal: Adequacy of tissue perfusion will improve 12/02/2022 1328 by Angelena Sole, RN Outcome: Completed/Met 12/02/2022 1327 by Angelena Sole, RN Outcome: Adequate for Discharge

## 2022-12-02 NOTE — Progress Notes (Signed)
Pt ambulates in hall with steady gati, stand by assist. O2 sat is 92-96% with heart rate 130s to 150, pt asymptomatic. States she just recd a breathing treatment and this happens each time. Back to room. Pt denies sob. No distress noted.Dr Tawanna Solo made aware of results.

## 2023-02-03 ENCOUNTER — Encounter (HOSPITAL_BASED_OUTPATIENT_CLINIC_OR_DEPARTMENT_OTHER): Payer: Self-pay

## 2023-02-03 ENCOUNTER — Other Ambulatory Visit: Payer: Self-pay

## 2023-02-03 DIAGNOSIS — I10 Essential (primary) hypertension: Secondary | ICD-10-CM | POA: Insufficient documentation

## 2023-02-03 DIAGNOSIS — E119 Type 2 diabetes mellitus without complications: Secondary | ICD-10-CM | POA: Insufficient documentation

## 2023-02-03 DIAGNOSIS — J45909 Unspecified asthma, uncomplicated: Secondary | ICD-10-CM | POA: Insufficient documentation

## 2023-02-03 DIAGNOSIS — Z7982 Long term (current) use of aspirin: Secondary | ICD-10-CM | POA: Diagnosis not present

## 2023-02-03 DIAGNOSIS — Z79899 Other long term (current) drug therapy: Secondary | ICD-10-CM | POA: Diagnosis not present

## 2023-02-03 DIAGNOSIS — Z7951 Long term (current) use of inhaled steroids: Secondary | ICD-10-CM | POA: Insufficient documentation

## 2023-02-03 DIAGNOSIS — R2243 Localized swelling, mass and lump, lower limb, bilateral: Secondary | ICD-10-CM | POA: Insufficient documentation

## 2023-02-03 LAB — CBC WITH DIFFERENTIAL/PLATELET
Abs Immature Granulocytes: 0.03 10*3/uL (ref 0.00–0.07)
Basophils Absolute: 0.1 10*3/uL (ref 0.0–0.1)
Basophils Relative: 1 %
Eosinophils Absolute: 0.6 10*3/uL — ABNORMAL HIGH (ref 0.0–0.5)
Eosinophils Relative: 4 %
HCT: 38.5 % (ref 36.0–46.0)
Hemoglobin: 13.5 g/dL (ref 12.0–15.0)
Immature Granulocytes: 0 %
Lymphocytes Relative: 31 %
Lymphs Abs: 3.9 10*3/uL (ref 0.7–4.0)
MCH: 31.4 pg (ref 26.0–34.0)
MCHC: 35.1 g/dL (ref 30.0–36.0)
MCV: 89.5 fL (ref 80.0–100.0)
Monocytes Absolute: 1 10*3/uL (ref 0.1–1.0)
Monocytes Relative: 8 %
Neutro Abs: 7.1 10*3/uL (ref 1.7–7.7)
Neutrophils Relative %: 56 %
Platelets: 353 10*3/uL (ref 150–400)
RBC: 4.3 MIL/uL (ref 3.87–5.11)
RDW: 12.7 % (ref 11.5–15.5)
WBC: 12.6 10*3/uL — ABNORMAL HIGH (ref 4.0–10.5)
nRBC: 0 % (ref 0.0–0.2)

## 2023-02-03 LAB — COMPREHENSIVE METABOLIC PANEL
ALT: 12 U/L (ref 0–44)
AST: 13 U/L — ABNORMAL LOW (ref 15–41)
Albumin: 3.9 g/dL (ref 3.5–5.0)
Alkaline Phosphatase: 82 U/L (ref 38–126)
Anion gap: 9 (ref 5–15)
BUN: 9 mg/dL (ref 6–20)
CO2: 29 mmol/L (ref 22–32)
Calcium: 8.8 mg/dL — ABNORMAL LOW (ref 8.9–10.3)
Chloride: 93 mmol/L — ABNORMAL LOW (ref 98–111)
Creatinine, Ser: 0.62 mg/dL (ref 0.44–1.00)
GFR, Estimated: >60 mL/min (ref >60)
Glucose, Bld: 366 mg/dL — ABNORMAL HIGH (ref 70–99)
Potassium: 4 mmol/L (ref 3.5–5.1)
Sodium: 131 mmol/L — ABNORMAL LOW (ref 135–145)
Total Bilirubin: 0.3 mg/dL (ref 0.3–1.2)
Total Protein: 6.4 g/dL — ABNORMAL LOW (ref 6.5–8.1)

## 2023-02-03 NOTE — ED Triage Notes (Signed)
C/o ongoing 3mos or more of lower extremity edema with discomfort. Denies dyspnea

## 2023-02-04 ENCOUNTER — Emergency Department (HOSPITAL_BASED_OUTPATIENT_CLINIC_OR_DEPARTMENT_OTHER)
Admission: EM | Admit: 2023-02-04 | Discharge: 2023-02-04 | Disposition: A | Discharge status: Home / Self Care | Payer: Managed Care, Other (non HMO) | Attending: Emergency Medicine | Admitting: Emergency Medicine

## 2023-02-04 ENCOUNTER — Emergency Department (HOSPITAL_BASED_OUTPATIENT_CLINIC_OR_DEPARTMENT_OTHER): Payer: Managed Care, Other (non HMO)

## 2023-02-04 DIAGNOSIS — R600 Localized edema: Secondary | ICD-10-CM

## 2023-02-04 LAB — URINALYSIS, ROUTINE W REFLEX MICROSCOPIC
Bacteria, UA: NONE SEEN
Bilirubin Urine: NEGATIVE
Glucose, UA: >1000 mg/dL — AB
Hgb urine dipstick: NEGATIVE
Ketones, ur: NEGATIVE mg/dL
Leukocytes,Ua: NEGATIVE
Nitrite: NEGATIVE
Protein, ur: NEGATIVE mg/dL
Specific Gravity, Urine: 1.033 — ABNORMAL HIGH (ref 1.005–1.030)
pH: 5.5 (ref 5.0–8.0)

## 2023-02-04 LAB — BRAIN NATRIURETIC PEPTIDE: B Natriuretic Peptide: 93 pg/mL (ref 0.0–100.0)

## 2023-02-04 LAB — CBG MONITORING, ED: Glucose-Capillary: 329 mg/dL — ABNORMAL HIGH (ref 70–99)

## 2023-02-04 LAB — D-DIMER, QUANTITATIVE: D-Dimer, Quant: 0.5 ug/mL-FEU (ref 0.00–0.50)

## 2023-02-04 MED ORDER — KETOROLAC TROMETHAMINE 15 MG/ML IJ SOLN
15.0000 mg | Freq: Once | INTRAMUSCULAR | Status: AC
  Administered 2023-02-04: 15 mg via INTRAVENOUS
  Filled 2023-02-04: qty 1

## 2023-02-04 NOTE — ED Provider Notes (Signed)
[place] EMERGENCY DEPARTMENT AT [facility] Provider Note  CSN: [id number] Arrival date & time: 02/03/23 2205  Chief Complaint(s) Leg Swelling (C/o ongoing 3mos or more of lower extremity edema with discomfort. Denies dyspnea )  HPI [name] is a 42 y.o. female with a past medical history listed below who presents to the emergency department months gradually worsening bilateral lower extremity edema. Over the past several weeks, she has developed discomfort due to the edema.  She denies any fall or trauma.  She denies any prolonged immobility.  No history of liver, renal, heart dysfunction.  She reports that she was placed on 3 days of a diuretic 2 weeks ago by her PCP with minimal relief.  The history is provided by the patient.    Past Medical History Past Medical History:  Diagnosis Date   Anxiety    Asthma    Back pain    CAP (community acquired pneumonia)    Chronic pain syndrome    Depression    Diabetes mellitus without complication (HCC)    Gallbladder problem    Obsessive compulsive disorder    Patient Active Problem List   Diagnosis Date Noted   Acute asthma exacerbation 12/01/2022   Essential hypertension 11/30/2022   Anxiety and depression 11/30/2022   Diabetic neuropathy (HCC) 11/30/2022   Acute bronchitis 08/13/2022   Acute severe exacerbation of moderate persistent asthma 08/12/2022   Moderate persistent asthma 12/02/2021   Diaphoresis 12/02/2021   Altered mental status 12/02/2021   Respiratory failure with hypoxia (HCC) 03/09/2021   Diabetes mellitus without complication (HCC)    Type 2 diabetes mellitus with hyperglycemia (HCC) 09/22/2020   Protrusion of lumbar intervertebral disc 03/31/2020   Obesity (BMI 30.0-34.9) 04/11/2019   Neuropathy 04/11/2019   Dyslipidemia 04/11/2019   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   Uncontrolled type 2 diabetes mellitus with hyperglycemia, with long-term current  use of insulin (HCC) 04/11/2019   Overdose of antipsychotic 01/23/2017   Hypoxia 01/17/2017   Diabetes 1.5, 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   Bipolar I disorder, most recent episode depressed (HCC) 06/11/2013   Depression with suicidal ideation 06/09/2013   Alcohol abuse, episodic drinking behavior 06/09/2013   Severe episode of recurrent major depressive disorder, 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 Authorizing Provider  acetaminophen (TYLENOL) 500 MG tablet Take 1 tablet (500 mg total) by mouth every 6 (six) hours as needed. Patient taking differently: Take 500 mg by mouth every 6 (six) hours as needed for moderate pain. 04/26/22   Redwine, Madison A, PA-C  albuterol (PROVENTIL) (2.5 MG/3ML) 0.083% nebulizer solution Take 2.5 mg by nebulization every 6 (six) hours as needed for wheezing or shortness of breath. 11/29/22 11/29/23  [provider]  albuterol (VENTOLIN HFA) 108 (90 Base) MCG/ACT inhaler Inhale 1-2 puffs into the lungs every 6 (six) hours as needed for wheezing or shortness of breath. 12/02/22   Burnadette Pop, MD  amphetamine-dextroamphetamine (ADDERALL) 30 MG tablet Take 30 mg by mouth daily. 09/28/20   [provider]  ARIPiprazole (ABILIFY) 10 MG tablet Take 10 mg by mouth every morning. 01/21/21   [provider]  aspirin EC 81 MG  tablet Take 81 mg by mouth daily. Swallow whole.    [provider]  BD PEN NEEDLE NANO 2ND GEN 32G X 4 MM MISC daily. 09/23/20   [provider]  busPIRone (BUSPAR) 15 MG tablet Take 15 mg by mouth 3 (three) times daily. 10/14/22   [provider]   Continuous Blood Gluc Sensor (FREESTYLE LIBRE 3 SENSOR) MISC 1 each every 14 (fourteen) days. 11/21/22   [provider]  Empagliflozin-metFORMIN HCl (SYNJARDY) 12.02-999 MG TABS Take 1 tablet by mouth 2 (two) times daily. 02/12/21   [provider]  ezetimibe (ZETIA) 10 MG tablet Take 1 tablet (10 mg total) by mouth daily. 09/26/17   Bing Neighbors, NP  Fluticasone Furoate-Vilanterol (BREO ELLIPTA) 50-25 MCG/ACT AEPB Inhale 1 puff into the lungs daily in the afternoon. Patient not taking: Reported on 12/01/2022 08/13/22   Noralee Stain, DO  gabapentin (NEURONTIN) 100 MG capsule Take 1 capsule (100 mg total) by mouth daily after breakfast. Patient taking differently: Take 100 mg by mouth every morning. 02/14/20   Kerrin Champagne, MD  gabapentin (NEURONTIN) 300 MG capsule Take 1-2 capsules (300-600 mg total) by mouth at bedtime. Only take as prescribed.  Husband is to administer medication Patient taking differently: Take 600 mg by mouth at bedtime. 02/14/20   Kerrin Champagne, MD  lisinopril (PRINIVIL,ZESTRIL) 2.5 MG tablet Take 1 tablet (2.5 mg total) by mouth daily. 05/10/17   Bing Neighbors, NP  Multiple Vitamin (MULTIVITAMIN WITH MINERALS) TABS tablet Take 1 tablet by mouth every morning.    [provider]  NOVOLOG MIX 70/30 FLEXPEN (70-30) 100 UNIT/ML FlexPen Inject 44 Units into the skin 2 (two) times daily. 11/25/22   [provider]  Omega-3 Fatty Acids (FISH OIL PO) Take 1 capsule by mouth every morning.    [provider]  pravastatin (PRAVACHOL) 80 MG tablet Take 1 tablet (80 mg total) by mouth daily. Patient taking differently: Take 80 mg by mouth every morning. 05/11/17   Bing Neighbors, NP  predniSONE (DELTASONE) 10 MG tablet Take 4 tablets by mouth daily for 3 days THEN 3 tablets daily for 3 days THEN 2 tablets daily for 3 days Then 1 tablet daily for 3 days 12/02/22   Burnadette Pop, MD  Semaglutide, 1 MG/DOSE, (OZEMPIC, 1 MG/DOSE,) 4  MG/3ML SOPN Inject 2 mg into the skin once a week. Friday 02/12/21   [provider]  sertraline (ZOLOFT) 100 MG tablet Take 2 tablets (200 mg total) by mouth daily. For depression and anxiety. 06/13/13   Tamala Julian, PA-C  traZODone (DESYREL) 100 MG tablet Take 200 mg by mouth at bedtime. 01/21/21   [provider]                                                                                                                                    Allergies Orange oil, Penicillins, Carisoprodol, and  Piroxicam  Review of Systems Review of Systems As noted in HPI  Physical Exam Vital Signs  I have reviewed the triage vital signs BP (!) 104/58   Pulse 64   Temp 98 F (36.7 C)   Resp 14   Ht 5\' 6"  (1.676 m)   Wt 100.7 kg   LMP 01/31/2023 (Approximate)   SpO2 95%   BMI 35.81 kg/m   Physical Exam Vitals reviewed.  Constitutional:      General: She is not in acute distress.    Appearance: She is well-developed. She is not diaphoretic.  HENT:     Head: Normocephalic and atraumatic.     Nose: Nose normal.  Eyes:     General: No scleral icterus.       Right eye: No discharge.        Left eye: No discharge.     Conjunctiva/sclera: Conjunctivae normal.     Pupils: Pupils are equal, round, and reactive to light.  Cardiovascular:     Rate and Rhythm: Normal rate and regular rhythm.     Heart sounds: No murmur heard.    No friction rub. No gallop.  Pulmonary:     Effort: Pulmonary effort is normal. No respiratory distress.     Breath sounds: Normal breath sounds. No stridor. No rales.  Abdominal:     General: There is no distension.     Palpations: Abdomen is soft.     Tenderness: There is no abdominal tenderness.  Musculoskeletal:        General: No tenderness.     Cervical back: Normal range of motion and neck supple.     Right lower leg: 1+ Pitting Edema present.     Left lower leg: 1+ Pitting Edema present.  Skin:    General: Skin is warm and dry.      Findings: No erythema or rash.  Neurological:     Mental Status: She is alert and oriented to person, place, and time.     ED Results and Treatments Labs (all labs ordered are listed, but only abnormal results are displayed) Labs Reviewed  COMPREHENSIVE METABOLIC PANEL - Abnormal; Notable for the following components:      Result Value   Sodium 131 (*)    Chloride 93 (*)    Glucose, Bld 366 (*)    Calcium 8.8 (*)    Total Protein 6.4 (*)    AST 13 (*)    All other components within normal limits  URINALYSIS, ROUTINE W REFLEX MICROSCOPIC - Abnormal; Notable for the following components:   Specific Gravity, Urine 1.033 (*)    Glucose, UA >1,000 (*)    All other components within normal limits  CBC WITH DIFFERENTIAL/PLATELET - Abnormal; Notable for the following components:   WBC 12.6 (*)    Eosinophils Absolute 0.6 (*)    All other components within normal limits  CBG MONITORING, ED - Abnormal; Notable for the following components:   Glucose-Capillary 329 (*)    All other components within normal limits  BRAIN NATRIURETIC PEPTIDE  D-DIMER, QUANTITATIVE  EKG  EKG Interpretation  Date/Time:    Ventricular Rate:    PR Interval:    QRS Duration:   QT Interval:    QTC Calculation:   R Axis:     Text Interpretation:         Radiology DG Chest Port 1 View  Result Date: 02/04/2023 CLINICAL DATA:  Leg swelling EXAM: PORTABLE CHEST 1 VIEW COMPARISON:  11/30/2022 FINDINGS: Heart and mediastinal contours are within normal limits. No focal opacities or effusions. No acute bony abnormality. IMPRESSION: No active disease. Electronically Signed   By: Charlett Nose M.D.   On: 02/04/2023 01:57    Medications Ordered in ED Medications  ketorolac (TORADOL) 15 MG/ML injection 15 mg (15 mg Intravenous Given 02/04/23 0149)                                                                                                                                      Procedures Procedures  (including critical care time)  Medical Decision Making / ED Course  Click here for ABCD2, HEART and other calculators  Medical Decision Making Amount and/or Complexity of Data Reviewed Labs: ordered. Radiology: ordered.  Risk Prescription drug management.    This patient presents to the ED for concern of BLE edema, this involves an extensive number of treatment options, and is a complaint that carries with it a high risk of complications and morbidity. The differential diagnosis includes but not limited to:  Venous stasis, DVT, renal/liver insufficiency, nephrotic syndrome, HF, medication side effects, CRPS   Initial intervention:  Toradol for pain  Work up Interpretation and Management:  Cardiac Monitoring/EKG: EKG w/o acute ischemic changes, dysrhythmias, or blocks  Laboratory Tests ordered listed below with my independent interpretation: CBC with mild leukocytosis but stable from her previous.  No anemia. CMP with mild hyponatremia and hypochloremia.  No other significant electrolyte derangements.  Patient has hyperglycemia without evidence of DKA. Normal LFTs UA without evidence of infection, hematuria or proteinuria Dimer negative BnP negative   Imaging Studies ordered listed below with my independent interpretation: Chest x-ray without evidence of cardiomegaly or pulmonary edema.    Reassessment: Patient remained stable with soft blood pressures at baseline. Pain improved with Toradol Workup is grossly reassuring ruling out DVT, renal/liver insufficiency, nephrotic syndrome, heart failure. Given her soft blood pressure, will not recommend diuretics at this time. Patient instructed to follow-up closely with her primary care provider.     Final Clinical Impression(s) / ED Diagnoses Final diagnoses:  Bilateral leg edema   The patient appears  reasonably screened and/or stabilized for discharge and I doubt any other medical condition or other [facility] requiring further screening, evaluation, or treatment in the ED at this time. I have discussed the findings, Dx and Tx plan with the patient/family who expressed understanding and agree(s) with the plan. Discharge instructions discussed at length. The patient/family was given strict return precautions who verbalized understanding  of the instructions. No further questions at time of discharge.  Disposition: Discharge  Condition: Good  ED Discharge Orders     None        Follow Up: April Manson, NP [address] [place] [address] [phone]  Call  to schedule an appointment for close follow up           This chart was dictated using voice recognition software.  Despite best efforts to proofread,  errors can occur which can change the documentation meaning.    Nira Conn, MD 02/04/23 [phone]

## 2023-05-29 ENCOUNTER — Other Ambulatory Visit: Payer: Self-pay

## 2023-05-29 ENCOUNTER — Emergency Department (HOSPITAL_BASED_OUTPATIENT_CLINIC_OR_DEPARTMENT_OTHER): Payer: Managed Care, Other (non HMO)

## 2023-05-29 ENCOUNTER — Emergency Department (HOSPITAL_BASED_OUTPATIENT_CLINIC_OR_DEPARTMENT_OTHER)
Admission: EM | Admit: 2023-05-29 | Discharge: 2023-05-29 | Disposition: A | Discharge status: Home / Self Care | Payer: Managed Care, Other (non HMO) | Attending: Emergency Medicine | Admitting: Emergency Medicine

## 2023-05-29 ENCOUNTER — Encounter (HOSPITAL_BASED_OUTPATIENT_CLINIC_OR_DEPARTMENT_OTHER): Payer: Self-pay

## 2023-05-29 DIAGNOSIS — Z7951 Long term (current) use of inhaled steroids: Secondary | ICD-10-CM | POA: Diagnosis not present

## 2023-05-29 DIAGNOSIS — Z7982 Long term (current) use of aspirin: Secondary | ICD-10-CM | POA: Diagnosis not present

## 2023-05-29 DIAGNOSIS — J4541 Moderate persistent asthma with (acute) exacerbation: Secondary | ICD-10-CM | POA: Insufficient documentation

## 2023-05-29 DIAGNOSIS — Z7952 Long term (current) use of systemic steroids: Secondary | ICD-10-CM | POA: Insufficient documentation

## 2023-05-29 DIAGNOSIS — R0602 Shortness of breath: Secondary | ICD-10-CM | POA: Diagnosis present

## 2023-05-29 LAB — CBC WITH DIFFERENTIAL/PLATELET
Abs Immature Granulocytes: 0.03 10*3/uL (ref 0.00–0.07)
Basophils Absolute: 0.1 10*3/uL (ref 0.0–0.1)
Basophils Relative: 1 %
Eosinophils Absolute: 0.6 10*3/uL — ABNORMAL HIGH (ref 0.0–0.5)
Eosinophils Relative: 7 %
HCT: 39.6 % (ref 36.0–46.0)
Hemoglobin: 13.7 g/dL (ref 12.0–15.0)
Immature Granulocytes: 0 %
Lymphocytes Relative: 26 %
Lymphs Abs: 2.1 10*3/uL (ref 0.7–4.0)
MCH: 31.2 pg (ref 26.0–34.0)
MCHC: 34.6 g/dL (ref 30.0–36.0)
MCV: 90.2 fL (ref 80.0–100.0)
Monocytes Absolute: 0.5 10*3/uL (ref 0.1–1.0)
Monocytes Relative: 6 %
Neutro Abs: 5 10*3/uL (ref 1.7–7.7)
Neutrophils Relative %: 60 %
Platelets: 311 10*3/uL (ref 150–400)
RBC: 4.39 MIL/uL (ref 3.87–5.11)
RDW: 12.6 % (ref 11.5–15.5)
WBC: 8.2 10*3/uL (ref 4.0–10.5)
nRBC: 0 % (ref 0.0–0.2)

## 2023-05-29 LAB — BASIC METABOLIC PANEL
Anion gap: 10 (ref 5–15)
BUN: 7 mg/dL (ref 6–20)
CO2: 27 mmol/L (ref 22–32)
Calcium: 9.4 mg/dL (ref 8.9–10.3)
Chloride: 98 mmol/L (ref 98–111)
Creatinine, Ser: 0.55 mg/dL (ref 0.44–1.00)
GFR, Estimated: >60 mL/min (ref >60)
Glucose, Bld: 424 mg/dL — ABNORMAL HIGH (ref 70–99)
Potassium: 4.1 mmol/L (ref 3.5–5.1)
Sodium: 135 mmol/L (ref 135–145)

## 2023-05-29 LAB — CBG MONITORING, ED
Glucose-Capillary: 430 mg/dL — ABNORMAL HIGH (ref 70–99)
Glucose-Capillary: 310 mg/dL — ABNORMAL HIGH (ref 70–99)

## 2023-05-29 MED ORDER — INSULIN REGULAR HUMAN 100 UNIT/ML IJ SOLN
8.0000 [IU] | Freq: Once | INTRAMUSCULAR | Status: DC
Start: 2023-05-29 — End: 2023-05-29

## 2023-05-29 MED ORDER — INSULIN ASPART 100 UNIT/ML IJ SOLN
8.0000 [IU] | Freq: Once | INTRAMUSCULAR | Status: AC
  Administered 2023-05-29: 8 [IU] via SUBCUTANEOUS

## 2023-05-29 MED ORDER — METHYLPREDNISOLONE SODIUM SUCC 125 MG IJ SOLR
125.0000 mg | Freq: Once | INTRAMUSCULAR | Status: AC
  Administered 2023-05-29: 125 mg via INTRAVENOUS
  Filled 2023-05-29: qty 2

## 2023-05-29 MED ORDER — PREDNISONE 10 MG PO TABS: 20.0000 mg | ORAL_TABLET | Freq: Two times a day (BID) | ORAL | 0 refills | Status: DC

## 2023-05-29 MED ORDER — HYDROCODONE-ACETAMINOPHEN 5-325 MG PO TABS
2.0000 | ORAL_TABLET | Freq: Once | ORAL | Status: AC
  Administered 2023-05-29: 2 via ORAL
  Filled 2023-05-29: qty 2

## 2023-05-29 MED ORDER — IPRATROPIUM-ALBUTEROL 0.5-2.5 (3) MG/3ML IN SOLN
3.0000 mL | Freq: Once | RESPIRATORY_TRACT | Status: AC
  Administered 2023-05-29: 3 mL via RESPIRATORY_TRACT
  Filled 2023-05-29: qty 3

## 2023-05-29 MED ORDER — ALBUTEROL SULFATE HFA 108 (90 BASE) MCG/ACT IN AERS: 2.0000 | INHALATION_SPRAY | RESPIRATORY_TRACT | Status: DC | PRN

## 2023-05-29 NOTE — ED Triage Notes (Signed)
Pt arrived POV for SOB, has hx of asthma, former smoker, reports uses vape. Pt reports slight productive cough. Wheezing noted in bilateral lung fields

## 2023-05-29 NOTE — Discharge Instructions (Signed)
Begin taking prednisone as prescribed.  Continue use of your inhaler/nebulizer as before.  Follow-up with your primary doctor if symptoms are not improving.

## 2023-05-29 NOTE — ED Provider Notes (Signed)
[place] EMERGENCY DEPARTMENT AT [facility] Provider Note   CSN: [id number] Arrival date & time: 05/29/23  0125     History  Chief Complaint  Patient presents with   Shortness of Breath    [name] is a 42 y.o. female.  Patient is a 42 year old female with history of asthma, bipolar, hyperlipidemia, chronic back pain, ADHD.  Patient presenting today with complaints of shortness of breath and wheezing.  This is worsened over the past 2 days.  She denies fevers or chills, but does report intermittent cough that is productive of green sputum.  No fevers or chills.  She has been using her home breathing treatments and albuterol inhaler without relief.  Patient also reports increased pain with her chronic back problems and is requesting something for pain.  The history is provided by the patient.       Home Medications Prior to Admission medications   Medication Sig Start Date End Date Taking? Authorizing Provider  acetaminophen (TYLENOL) 500 MG tablet Take 1 tablet (500 mg total) by mouth every 6 (six) hours as needed. Patient taking differently: Take 500 mg by mouth every 6 (six) hours as needed for moderate pain. 04/26/22   Redwine, Madison A, PA-C  albuterol (PROVENTIL) (2.5 MG/3ML) 0.083% nebulizer solution Take 2.5 mg by nebulization every 6 (six) hours as needed for wheezing or shortness of breath. 11/29/22 11/29/23  [provider]  albuterol (VENTOLIN HFA) 108 (90 Base) MCG/ACT inhaler Inhale 1-2 puffs into the lungs every 6 (six) hours as needed for wheezing or shortness of breath. 12/02/22   Burnadette Pop, MD  amphetamine-dextroamphetamine (ADDERALL) 30 MG tablet Take 30 mg by mouth daily. 09/28/20   [provider]  ARIPiprazole (ABILIFY) 10 MG tablet Take 10 mg by mouth every morning. 01/21/21   [provider]  aspirin EC 81 MG tablet Take 81 mg by mouth daily. Swallow whole.    [provider]  BD PEN NEEDLE NANO  2ND GEN 32G X 4 MM MISC daily. 09/23/20   [provider]  busPIRone (BUSPAR) 15 MG tablet Take 15 mg by mouth 3 (three) times daily. 10/14/22   [provider]  Continuous Blood Gluc Sensor (FREESTYLE LIBRE 3 SENSOR) MISC 1 each every 14 (fourteen) days. 11/21/22   [provider]  Empagliflozin-metFORMIN HCl (SYNJARDY) 12.02-999 MG TABS Take 1 tablet by mouth 2 (two) times daily. 02/12/21   [provider]  ezetimibe (ZETIA) 10 MG tablet Take 1 tablet (10 mg total) by mouth daily. 09/26/17   Bing Neighbors, NP  Fluticasone Furoate-Vilanterol (BREO ELLIPTA) 50-25 MCG/ACT AEPB Inhale 1 puff into the lungs daily in the afternoon. Patient not taking: Reported on 12/01/2022 08/13/22   Noralee Stain, DO  gabapentin (NEURONTIN) 100 MG capsule Take 1 capsule (100 mg total) by mouth daily after breakfast. Patient taking differently: Take 100 mg by mouth every morning. 02/14/20   Kerrin Champagne, MD  gabapentin (NEURONTIN) 300 MG capsule Take 1-2 capsules (300-600 mg total) by mouth at bedtime. Only take as prescribed.  Husband is to administer medication Patient taking differently: Take 600 mg by mouth at bedtime. 02/14/20   Kerrin Champagne, MD  lisinopril (PRINIVIL,ZESTRIL) 2.5 MG tablet Take 1 tablet (2.5 mg total) by mouth daily. 05/10/17   Bing Neighbors, NP  Multiple Vitamin (MULTIVITAMIN WITH MINERALS) TABS tablet Take 1 tablet by mouth every morning.    [provider]  NOVOLOG MIX 70/30 FLEXPEN (70-30) 100 UNIT/ML  FlexPen Inject 44 Units into the skin 2 (two) times daily. 11/25/22   [provider]  Omega-3 Fatty Acids (FISH OIL PO) Take 1 capsule by mouth every morning.    [provider]  pravastatin (PRAVACHOL) 80 MG tablet Take 1 tablet (80 mg total) by mouth daily. Patient taking differently: Take 80 mg by mouth every morning. 05/11/17   Bing Neighbors, NP  predniSONE (DELTASONE) 10 MG tablet Take 4 tablets by mouth daily for 3 days  THEN 3 tablets daily for 3 days THEN 2 tablets daily for 3 days Then 1 tablet daily for 3 days 12/02/22   Burnadette Pop, MD  Semaglutide, 1 MG/DOSE, (OZEMPIC, 1 MG/DOSE,) 4 MG/3ML SOPN Inject 2 mg into the skin once a week. Friday 02/12/21   [provider]  sertraline (ZOLOFT) 100 MG tablet Take 2 tablets (200 mg total) by mouth daily. For depression and anxiety. 06/13/13   Tamala Julian, PA-C  traZODone (DESYREL) 100 MG tablet Take 200 mg by mouth at bedtime. 01/21/21   [provider]      Allergies    Orange oil, Penicillins, Carisoprodol, and Piroxicam    Review of Systems   Review of Systems  All other systems reviewed and are negative.   Physical Exam Updated Vital Signs BP 113/68   Pulse 97   Temp 98.4 F (36.9 C) (Oral)   Resp 18   Ht 5\' 6"  (1.676 m)   Wt 104.3 kg   LMP 05/26/2023 (Exact Date)   SpO2 98%   BMI 37.12 kg/m  Physical Exam Vitals and nursing note reviewed.  Constitutional:      General: She is not in acute distress.    Appearance: She is well-developed. She is not diaphoretic.  HENT:     Head: Normocephalic and atraumatic.  Cardiovascular:     Rate and Rhythm: Normal rate and regular rhythm.     Heart sounds: No murmur heard.    No friction rub. No gallop.  Pulmonary:     Effort: Pulmonary effort is normal. No respiratory distress.     Breath sounds: Examination of the right-middle field reveals wheezing. Examination of the left-middle field reveals wheezing. Wheezing present.  Abdominal:     General: Bowel sounds are normal. There is no distension.     Palpations: Abdomen is soft.     Tenderness: There is no abdominal tenderness.  Musculoskeletal:        General: Normal range of motion.     Cervical back: Normal range of motion and neck supple.  Skin:    General: Skin is warm and dry.  Neurological:     General: No focal deficit present.     Mental Status: She is alert and oriented to person, place, and time.     ED  Results / Procedures / Treatments   Labs (all labs ordered are listed, but only abnormal results are displayed) Labs Reviewed  CBC WITH DIFFERENTIAL/PLATELET - Abnormal; Notable for the following components:      Result Value   Eosinophils Absolute 0.6 (*)    All other components within normal limits  BASIC METABOLIC PANEL - Abnormal; Notable for the following components:   Glucose, Bld 424 (*)    All other components within normal limits  CBG MONITORING, ED - Abnormal; Notable for the following components:   Glucose-Capillary 430 (*)    All other components within normal limits    EKG None  Radiology No results found.  Procedures  Procedures    Medications Ordered in ED Medications  albuterol (VENTOLIN HFA) 108 (90 Base) MCG/ACT inhaler 2 puff (has no administration in time range)  methylPREDNISolone sodium succinate (SOLU-MEDROL) 125 mg/2 mL injection 125 mg (has no administration in time range)  HYDROcodone-acetaminophen (NORCO/VICODIN) 5-325 MG per tablet 2 tablet (has no administration in time range)  ipratropium-albuterol (DUONEB) 0.5-2.5 (3) MG/3ML nebulizer solution 3 mL (3 mLs Nebulization Given 05/29/23 0140)    ED Course/ Medical Decision Making/ A&P  Patient here with wheezing and shortness of breath as described in the HPI.  She is also complaining of pain in her back.  Patient arrives here with stable vital signs and physical examination revealing wheezing, but no respiratory distress.  Laboratory studies including CBC, metabolic panel obtained and are both basically unremarkable with the exception of an elevated blood sugar.  Patient was given insulin and sugar has improved.  Patient was also given Solu-Medrol and breathing treatments x 2 and seems to be feeling better.  Oxygen saturations have maintained in the mid to upper 90s.  Patient to be discharged with a course of steroids, continued use of her inhaler/nebulizer, and follow-up as needed.  Final  Clinical Impression(s) / ED Diagnoses Final diagnoses:  None    Rx / DC Orders ED Discharge Orders     None         Geoffery Lyons, MD 05/29/23 [phone]

## 2023-06-04 ENCOUNTER — Encounter (HOSPITAL_BASED_OUTPATIENT_CLINIC_OR_DEPARTMENT_OTHER): Payer: Self-pay

## 2023-06-04 ENCOUNTER — Emergency Department (HOSPITAL_BASED_OUTPATIENT_CLINIC_OR_DEPARTMENT_OTHER): Payer: Managed Care, Other (non HMO)

## 2023-06-04 ENCOUNTER — Emergency Department (HOSPITAL_BASED_OUTPATIENT_CLINIC_OR_DEPARTMENT_OTHER): Payer: Managed Care, Other (non HMO) | Admitting: Radiology

## 2023-06-04 ENCOUNTER — Emergency Department (HOSPITAL_BASED_OUTPATIENT_CLINIC_OR_DEPARTMENT_OTHER)
Admission: EM | Admit: 2023-06-04 | Discharge: 2023-06-04 | Disposition: A | Discharge status: Home / Self Care | Payer: Managed Care, Other (non HMO) | Attending: Emergency Medicine | Admitting: Emergency Medicine

## 2023-06-04 DIAGNOSIS — E119 Type 2 diabetes mellitus without complications: Secondary | ICD-10-CM | POA: Insufficient documentation

## 2023-06-04 DIAGNOSIS — M5136 Other intervertebral disc degeneration, lumbar region: Secondary | ICD-10-CM

## 2023-06-04 DIAGNOSIS — M25531 Pain in right wrist: Secondary | ICD-10-CM | POA: Diagnosis present

## 2023-06-04 DIAGNOSIS — S63501A Unspecified sprain of right wrist, initial encounter: Secondary | ICD-10-CM | POA: Diagnosis not present

## 2023-06-04 DIAGNOSIS — D72829 Elevated white blood cell count, unspecified: Secondary | ICD-10-CM | POA: Diagnosis not present

## 2023-06-04 DIAGNOSIS — Z7982 Long term (current) use of aspirin: Secondary | ICD-10-CM | POA: Diagnosis not present

## 2023-06-04 DIAGNOSIS — M545 Low back pain, unspecified: Secondary | ICD-10-CM

## 2023-06-04 LAB — URINALYSIS, ROUTINE W REFLEX MICROSCOPIC
Bilirubin Urine: NEGATIVE
Glucose, UA: >1000 mg/dL — AB
Hgb urine dipstick: NEGATIVE
Ketones, ur: NEGATIVE mg/dL
Nitrite: NEGATIVE
Protein, ur: NEGATIVE mg/dL
Specific Gravity, Urine: 1.039 — ABNORMAL HIGH (ref 1.005–1.030)
pH: 7 (ref 5.0–8.0)

## 2023-06-04 LAB — CBC WITH DIFFERENTIAL/PLATELET
Abs Immature Granulocytes: 0.12 10*3/uL — ABNORMAL HIGH (ref 0.00–0.07)
Basophils Absolute: 0.1 10*3/uL (ref 0.0–0.1)
Basophils Relative: 0 %
Eosinophils Absolute: 0.2 10*3/uL (ref 0.0–0.5)
Eosinophils Relative: 1 %
HCT: 41.7 % (ref 36.0–46.0)
Hemoglobin: 14.2 g/dL (ref 12.0–15.0)
Immature Granulocytes: 1 %
Lymphocytes Relative: 33 %
Lymphs Abs: 5.6 10*3/uL — ABNORMAL HIGH (ref 0.7–4.0)
MCH: 30.9 pg (ref 26.0–34.0)
MCHC: 34.1 g/dL (ref 30.0–36.0)
MCV: 90.7 fL (ref 80.0–100.0)
Monocytes Absolute: 0.9 10*3/uL (ref 0.1–1.0)
Monocytes Relative: 5 %
Neutro Abs: 10.2 10*3/uL — ABNORMAL HIGH (ref 1.7–7.7)
Neutrophils Relative %: 60 %
Platelets: 334 10*3/uL (ref 150–400)
RBC: 4.6 MIL/uL (ref 3.87–5.11)
RDW: 12.9 % (ref 11.5–15.5)
WBC: 17.2 10*3/uL — ABNORMAL HIGH (ref 4.0–10.5)
nRBC: 0 % (ref 0.0–0.2)

## 2023-06-04 LAB — BASIC METABOLIC PANEL
Anion gap: 6 (ref 5–15)
BUN: 8 mg/dL (ref 6–20)
CO2: 33 mmol/L — ABNORMAL HIGH (ref 22–32)
Calcium: 8.3 mg/dL — ABNORMAL LOW (ref 8.9–10.3)
Chloride: 94 mmol/L — ABNORMAL LOW (ref 98–111)
Creatinine, Ser: 0.63 mg/dL (ref 0.44–1.00)
GFR, Estimated: >60 mL/min (ref >60)
Glucose, Bld: 287 mg/dL — ABNORMAL HIGH (ref 70–99)
Potassium: 4 mmol/L (ref 3.5–5.1)
Sodium: 133 mmol/L — ABNORMAL LOW (ref 135–145)

## 2023-06-04 LAB — PREGNANCY, URINE: Preg Test, Ur: NEGATIVE

## 2023-06-04 MED ORDER — IOHEXOL 300 MG/ML  SOLN
100.0000 mL | Freq: Once | INTRAMUSCULAR | Status: AC | PRN
  Administered 2023-06-04: 100 mL via INTRAVENOUS

## 2023-06-04 MED ORDER — HYDROCODONE-ACETAMINOPHEN 5-325 MG PO TABS
1.0000 | ORAL_TABLET | Freq: Once | ORAL | Status: AC
  Administered 2023-06-04: 1 via ORAL
  Filled 2023-06-04: qty 1

## 2023-06-04 MED ORDER — LACTATED RINGERS IV BOLUS
1000.0000 mL | Freq: Once | INTRAVENOUS | Status: AC
  Administered 2023-06-04: 1000 mL via INTRAVENOUS

## 2023-06-04 MED ORDER — HYDROCODONE-ACETAMINOPHEN 5-325 MG PO TABS: 1.0000 | ORAL_TABLET | Freq: Four times a day (QID) | ORAL | 0 refills | Status: DC | PRN

## 2023-06-04 MED ORDER — CYCLOBENZAPRINE HCL 5 MG PO TABS
5.0000 mg | ORAL_TABLET | Freq: Once | ORAL | Status: AC
  Administered 2023-06-04: 5 mg via ORAL
  Filled 2023-06-04: qty 1

## 2023-06-04 MED ORDER — ALBUTEROL SULFATE HFA 108 (90 BASE) MCG/ACT IN AERS
2.0000 | INHALATION_SPRAY | Freq: Once | RESPIRATORY_TRACT | Status: DC
  Filled 2023-06-04: qty 6.7

## 2023-06-04 MED ORDER — LIDOCAINE 5 % EX PTCH
1.0000 | MEDICATED_PATCH | CUTANEOUS | Status: DC
  Administered 2023-06-04: 1 via TRANSDERMAL
  Filled 2023-06-04: qty 1

## 2023-06-04 NOTE — ED Triage Notes (Signed)
Discharge instructions, follow up care, and pain management reviewed and explained, pt verbalized understanding and had no further questions on d/c. Pt caox4, ambulatory on d/c. Ice pack provided.
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
## 2023-06-04 NOTE — ED Notes (Signed)
Patient transported to CT 

## 2023-06-04 NOTE — Progress Notes (Signed)
RT gave the pt an albuterol inhaler to use as needed at home. Pt didn't want a spacer
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
## 2023-06-21 ENCOUNTER — Other Ambulatory Visit: Payer: Self-pay | Admitting: Nurse Practitioner

## 2023-06-21 DIAGNOSIS — J4540 Moderate persistent asthma, uncomplicated: Secondary | ICD-10-CM

## 2023-07-06 ENCOUNTER — Ambulatory Visit (HOSPITAL_COMMUNITY)
Admission: EM | Admit: 2023-07-06 | Discharge: 2023-07-06 | Discharge status: Against Medical Advice | Payer: 59 | Attending: Registered Nurse | Admitting: Registered Nurse

## 2023-07-06 ENCOUNTER — Encounter (HOSPITAL_COMMUNITY): Payer: Self-pay | Admitting: Registered Nurse

## 2023-07-06 DIAGNOSIS — F3130 Bipolar disorder, current episode depressed, mild or moderate severity, unspecified: Secondary | ICD-10-CM

## 2023-07-06 DIAGNOSIS — F1994 Other psychoactive substance use, unspecified with psychoactive substance-induced mood disorder: Secondary | ICD-10-CM | POA: Insufficient documentation

## 2023-07-06 DIAGNOSIS — F319 Bipolar disorder, unspecified: Secondary | ICD-10-CM | POA: Insufficient documentation

## 2023-07-06 DIAGNOSIS — F1910 Other psychoactive substance abuse, uncomplicated: Secondary | ICD-10-CM | POA: Diagnosis not present

## 2023-07-06 DIAGNOSIS — F1920 Other psychoactive substance dependence, uncomplicated: Secondary | ICD-10-CM

## 2023-07-06 LAB — COMPREHENSIVE METABOLIC PANEL
ALT: 14 U/L (ref 0–44)
AST: 13 U/L — ABNORMAL LOW (ref 15–41)
Albumin: 3.6 g/dL (ref 3.5–5.0)
Alkaline Phosphatase: 81 U/L (ref 38–126)
Anion gap: 9 (ref 5–15)
BUN: <5 mg/dL — ABNORMAL LOW (ref 6–20)
CO2: 30 mmol/L (ref 22–32)
Calcium: 8.9 mg/dL (ref 8.9–10.3)
Chloride: 97 mmol/L — ABNORMAL LOW (ref 98–111)
Creatinine, Ser: 0.69 mg/dL (ref 0.44–1.00)
GFR, Estimated: >60 mL/min (ref >60)
Glucose, Bld: 195 mg/dL — ABNORMAL HIGH (ref 70–99)
Potassium: 4.7 mmol/L (ref 3.5–5.1)
Sodium: 136 mmol/L (ref 135–145)
Total Bilirubin: 0.2 mg/dL — ABNORMAL LOW (ref 0.3–1.2)
Total Protein: 6.1 g/dL — ABNORMAL LOW (ref 6.5–8.1)

## 2023-07-06 LAB — HEMOGLOBIN A1C
Hgb A1c MFr Bld: 10.1 % — ABNORMAL HIGH (ref 4.8–5.6)
Mean Plasma Glucose: 243.17 mg/dL

## 2023-07-06 LAB — POCT URINE DRUG SCREEN - MANUAL ENTRY (I-SCREEN)
POC Amphetamine UR: NOT DETECTED
POC Buprenorphine (BUP): NOT DETECTED
POC Cocaine UR: NOT DETECTED
POC Marijuana UR: NOT DETECTED
POC Methadone UR: NOT DETECTED
POC Methamphetamine UR: POSITIVE — AB
POC Morphine: NOT DETECTED — NL
POC Oxazepam (BZO): POSITIVE — AB
POC Oxycodone UR: NOT DETECTED
POC Secobarbital (BAR): NOT DETECTED

## 2023-07-06 LAB — URINALYSIS, ROUTINE W REFLEX MICROSCOPIC
Bacteria, UA: NONE SEEN
Bilirubin Urine: NEGATIVE
Glucose, UA: >=500 mg/dL — AB
Hgb urine dipstick: NEGATIVE
Ketones, ur: NEGATIVE mg/dL
Nitrite: NEGATIVE
Protein, ur: NEGATIVE mg/dL
Specific Gravity, Urine: 1.015 (ref 1.005–1.030)
pH: 6 (ref 5.0–8.0)

## 2023-07-06 LAB — CBC WITH DIFFERENTIAL/PLATELET
Abs Immature Granulocytes: 0.06 10*3/uL (ref 0.00–0.07)
Basophils Absolute: 0.1 10*3/uL (ref 0.0–0.1)
Basophils Relative: 1 %
Eosinophils Absolute: 0.8 10*3/uL — ABNORMAL HIGH (ref 0.0–0.5)
Eosinophils Relative: 6 %
HCT: 43.6 % (ref 36.0–46.0)
Hemoglobin: 14.4 g/dL (ref 12.0–15.0)
Immature Granulocytes: 1 %
Lymphocytes Relative: 29 %
Lymphs Abs: 3.6 10*3/uL (ref 0.7–4.0)
MCH: 30.8 pg (ref 26.0–34.0)
MCHC: 33 g/dL (ref 30.0–36.0)
MCV: 93.2 fL (ref 80.0–100.0)
Monocytes Absolute: 0.8 10*3/uL (ref 0.1–1.0)
Monocytes Relative: 6 %
Neutro Abs: 7.3 10*3/uL (ref 1.7–7.7)
Neutrophils Relative %: 57 %
Platelets: 358 10*3/uL (ref 150–400)
RBC: 4.68 MIL/uL (ref 3.87–5.11)
RDW: 13 % (ref 11.5–15.5)
WBC: 12.6 10*3/uL — ABNORMAL HIGH (ref 4.0–10.5)
nRBC: 0 % (ref 0.0–0.2)

## 2023-07-06 LAB — LIPID PANEL
Cholesterol: 241 mg/dL — ABNORMAL HIGH (ref 0–200)
HDL: 51 mg/dL (ref >40)
LDL Cholesterol: 128 mg/dL — ABNORMAL HIGH (ref 0–99)
Total CHOL/HDL Ratio: 4.7 RATIO
Triglycerides: 310 mg/dL — ABNORMAL HIGH (ref <150)
VLDL: 62 mg/dL — ABNORMAL HIGH (ref 0–40)

## 2023-07-06 LAB — TSH: TSH: 0.787 u[IU]/mL (ref 0.350–4.500)

## 2023-07-06 LAB — MAGNESIUM: Magnesium: 1.9 mg/dL (ref 1.7–2.4)

## 2023-07-06 LAB — POC URINE PREG, ED: Preg Test, Ur: NEGATIVE

## 2023-07-06 LAB — ETHANOL: Alcohol, Ethyl (B): <10 mg/dL (ref <10)

## 2023-07-06 MED ORDER — ONDANSETRON 4 MG PO TBDP
4.0000 mg | ORAL_TABLET | Freq: Four times a day (QID) | ORAL | Status: DC | PRN
  Administered 2023-07-06: 4 mg via ORAL
  Filled 2023-07-06: qty 1

## 2023-07-06 MED ORDER — CLONIDINE HCL 0.1 MG PO TABS
0.1000 mg | ORAL_TABLET | Freq: Four times a day (QID) | ORAL | Status: DC
  Administered 2023-07-06: 0.1 mg via ORAL
  Filled 2023-07-06: qty 1

## 2023-07-06 MED ORDER — HYDROXYZINE HCL 25 MG PO TABS
25.0000 mg | ORAL_TABLET | Freq: Four times a day (QID) | ORAL | Status: DC | PRN
  Administered 2023-07-06: 25 mg via ORAL
  Filled 2023-07-06: qty 1

## 2023-07-06 MED ORDER — CLONIDINE HCL 0.1 MG PO TABS: 0.1000 mg | ORAL_TABLET | Freq: Every day | ORAL | Status: DC

## 2023-07-06 MED ORDER — SEMAGLUTIDE (1 MG/DOSE) 4 MG/3ML ~~LOC~~ SOPN: 2.0000 mg | PEN_INJECTOR | SUBCUTANEOUS | Status: DC

## 2023-07-06 MED ORDER — ALBUTEROL SULFATE (2.5 MG/3ML) 0.083% IN NEBU: 2.5000 mg | INHALATION_SOLUTION | Freq: Four times a day (QID) | RESPIRATORY_TRACT | Status: DC | PRN

## 2023-07-06 MED ORDER — TRAZODONE HCL 50 MG PO TABS: 50.0000 mg | ORAL_TABLET | Freq: Every evening | ORAL | Status: DC | PRN

## 2023-07-06 MED ORDER — TRAZODONE HCL 100 MG PO TABS: 200.0000 mg | ORAL_TABLET | Freq: Every day | ORAL | Status: DC

## 2023-07-06 MED ORDER — SERTRALINE HCL 100 MG PO TABS: 200.0000 mg | ORAL_TABLET | Freq: Every day | ORAL | Status: DC

## 2023-07-06 MED ORDER — CLONIDINE HCL 0.1 MG PO TABS: 0.1000 mg | ORAL_TABLET | ORAL | Status: DC

## 2023-07-06 MED ORDER — METHOCARBAMOL 500 MG PO TABS
500.0000 mg | ORAL_TABLET | Freq: Three times a day (TID) | ORAL | Status: DC | PRN
  Administered 2023-07-06: 500 mg via ORAL
  Filled 2023-07-06: qty 1

## 2023-07-06 MED ORDER — GABAPENTIN 100 MG PO CAPS: 100.0000 mg | ORAL_CAPSULE | Freq: Every day | ORAL | Status: DC

## 2023-07-06 MED ORDER — GABAPENTIN 300 MG PO CAPS: 600.0000 mg | ORAL_CAPSULE | Freq: Every day | ORAL | Status: DC

## 2023-07-06 MED ORDER — PRAVASTATIN SODIUM 40 MG PO TABS: 80.0000 mg | ORAL_TABLET | Freq: Every morning | ORAL | Status: DC

## 2023-07-06 MED ORDER — ALUM & MAG HYDROXIDE-SIMETH 200-200-20 MG/5ML PO SUSP: 30.0000 mL | ORAL | Status: DC | PRN

## 2023-07-06 MED ORDER — ARIPIPRAZOLE 10 MG PO TABS: 10.0000 mg | ORAL_TABLET | Freq: Every morning | ORAL | Status: DC

## 2023-07-06 MED ORDER — LOPERAMIDE HCL 2 MG PO CAPS: 2.0000 mg | ORAL_CAPSULE | ORAL | Status: DC | PRN

## 2023-07-06 MED ORDER — ALBUTEROL SULFATE HFA 108 (90 BASE) MCG/ACT IN AERS: 1.0000 | INHALATION_SPRAY | Freq: Four times a day (QID) | RESPIRATORY_TRACT | Status: DC | PRN

## 2023-07-06 MED ORDER — DICYCLOMINE HCL 20 MG PO TABS: 20.0000 mg | ORAL_TABLET | Freq: Four times a day (QID) | ORAL | Status: DC | PRN

## 2023-07-06 MED ORDER — MAGNESIUM HYDROXIDE 400 MG/5ML PO SUSP: 30.0000 mL | Freq: Every day | ORAL | Status: DC | PRN

## 2023-07-06 MED ORDER — ASPIRIN 81 MG PO TBEC: 81.0000 mg | DELAYED_RELEASE_TABLET | Freq: Every day | ORAL | Status: DC

## 2023-07-06 MED ORDER — ADULT MULTIVITAMIN W/MINERALS CH: 1.0000 | ORAL_TABLET | Freq: Every morning | ORAL | Status: DC

## 2023-07-06 MED ORDER — ACETAMINOPHEN 325 MG PO TABS: 650.0000 mg | ORAL_TABLET | Freq: Four times a day (QID) | ORAL | Status: DC | PRN

## 2023-07-06 MED ORDER — BUSPIRONE HCL 15 MG PO TABS
15.0000 mg | ORAL_TABLET | Freq: Three times a day (TID) | ORAL | Status: DC
  Administered 2023-07-06: 15 mg via ORAL
  Filled 2023-07-06: qty 1

## 2023-07-06 MED ORDER — INSULIN ASPART PROT & ASPART (70-30 MIX) 100 UNIT/ML PEN: 44.0000 [IU] | PEN_INJECTOR | Freq: Two times a day (BID) | SUBCUTANEOUS | Status: DC

## 2023-07-06 NOTE — ED Notes (Addendum)
Pt admitted to observation unit requesting detox from fentanyl and heroin. Pt reports last use was at 1100 this morning. Pt tearful on admission. Pt states feeling cravings, nausea and body aches. Pt states, "If I knew it was gonna be like this, I wouldn't of came in". Encouragement and support given. Skin assessment completed. Oriented to unit. Meal and drink offered. Pt denies SI/HI/AVH. Pt verbally contract for safety. Will monitor for safety.

## 2023-07-06 NOTE — ED Notes (Signed)
Pt is pacing the unit she is very anxious about her medications not being able to be verified until morning by pharmacist she denies SI/HI will continue  to monitor for safety
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
## 2023-07-06 NOTE — ED Notes (Signed)
Pt signed AMA form, left facility A&O x 4, gait steady, no distress noted.

## 2023-07-06 NOTE — ED Provider Notes (Signed)
FBC/OBS ASAP Discharge Summary  Date and Time: 07/07/2023 5:30 AM  Name: [name]  MRN:  [id number]   Discharge Diagnoses:  Final diagnoses:  Bipolar I disorder, most recent episode depressed (HCC)  Polysubstance (excluding opioids) dependence (HCC)  Polysubstance abuse (HCC)    Subjective: Pt alert and oriented x 4,  requested to be discharge, Writer was notify by RN that patient wanted to leave,  writer went to [facility] unit,  to see patient.  pt stated he wants to go,  Pt wanting to get oxycodone 15 mg and she didn't want to stay if she is not going to get oxycodone.  Pt was admit earlier today for substance abuse wanting to detox from fentanyl.    See prior notes:  [name] 42 y/o female patient with a psychiatric history of bipolar disorder and Opioid Use Disorder, severe presented to [facility] requesting assistance with detox.     [name],  seen face to face by this provider, chart reviewed, and consulted with Dr. Nelly Rout on 07/06/23.  On evaluation [name] requesting Is requesting assistance with detox from "pressed" oxycodone pills. Patient reports chronic history of pain and was receiving pain medication but felt she needed more and was buying off street.  States she was drug tested 2 yrs. ago and tested positive that's when she noticed that there were more in the pressed pill other than oxycodone.  Patient states she wants to detox.  Patient denies suicidal/self-harm/homicidal ideation, psychosis, and paranoia.  Recommending admission to [facility] but no beds available at this time.  Will admit to Continuous assessment until appropriate bed available.   During evaluation [name] is seated in exam room with no noted distress.  She is alert/oriented x 4, calm, cooperative, attentive, and responses were relevant and appropriate to assessment questions.  She spoke in a clear tone at moderate volume, and normal pace, with good  eye contact.   She denies suicidal/self-harm/homicidal ideation, psychosis, and paranoia.  Objectively there is no evidence of psychosis/mania or delusional thinking.  She conversed coherently, with goal directed thoughts, and no distractibility, or pre-occupation  Stay Summary: pt was encourage to stay and continue treatment, however patient decide to leave against medical advice.  Pt adamant that she wanted to be discharge now.   Total Time spent with patient: 15 minutes  Past Psychiatric History: substance abuse, bipolar dx Past Medical History: see chart  Family History: unknown Family Psychiatric History: unknown  Social History: fentanyl abuse Tobacco Cessation:  N/A, patient does not currently use tobacco products  Current Medications:  No current facility-administered medications for this encounter.   Current Outpatient Medications  Medication Sig Dispense Refill   acetaminophen (TYLENOL) 500 MG tablet Take 1 tablet (500 mg total) by mouth every 6 (six) hours as needed. (Patient taking differently: Take 500 mg by mouth every 6 (six) hours as needed for moderate pain.) 30 tablet 0   albuterol (PROVENTIL) (2.5 MG/3ML) 0.083% nebulizer solution Take 2.5 mg by nebulization every 6 (six) hours as needed for wheezing or shortness of breath.     albuterol (VENTOLIN HFA) 108 (90 Base) MCG/ACT inhaler Inhale 1-2 puffs into the lungs every 6 (six) hours as needed for wheezing or shortness of breath. 8 g 1   amphetamine-dextroamphetamine (ADDERALL) 30 MG tablet Take 30 mg by mouth daily.     ARIPiprazole (ABILIFY) 10 MG tablet Take 10 mg by mouth every morning.  aspirin EC 81 MG tablet Take 81 mg by mouth daily. Swallow whole.     BD PEN NEEDLE NANO 2ND GEN 32G X 4 MM MISC daily.     busPIRone (BUSPAR) 15 MG tablet Take 15 mg by mouth 3 (three) times daily.     Continuous Blood Gluc Sensor (FREESTYLE LIBRE 3 SENSOR) MISC 1 each every 14 (fourteen) days.     Empagliflozin-metFORMIN HCl  (SYNJARDY) 12.02-999 MG TABS Take 1 tablet by mouth 2 (two) times daily.     ezetimibe (ZETIA) 10 MG tablet Take 1 tablet (10 mg total) by mouth daily. 90 tablet 3   Fluticasone Furoate-Vilanterol (BREO ELLIPTA) 50-25 MCG/ACT AEPB Inhale 1 puff into the lungs daily in the afternoon. (Patient not taking: Reported on 12/01/2022) 60 each 1   gabapentin (NEURONTIN) 100 MG capsule Take 1 capsule (100 mg total) by mouth daily after breakfast. (Patient taking differently: Take 100 mg by mouth every morning.) 90 capsule 3   gabapentin (NEURONTIN) 300 MG capsule Take 1-2 capsules (300-600 mg total) by mouth at bedtime. Only take as prescribed.  Husband is to administer medication (Patient taking differently: Take 600 mg by mouth at bedtime.) 90 capsule 3   lisinopril (PRINIVIL,ZESTRIL) 2.5 MG tablet Take 1 tablet (2.5 mg total) by mouth daily. 90 tablet 2   Multiple Vitamin (MULTIVITAMIN WITH MINERALS) TABS tablet Take 1 tablet by mouth every morning.     NOVOLOG MIX 70/30 FLEXPEN (70-30) 100 UNIT/ML FlexPen Inject 44 Units into the skin 2 (two) times daily.     Omega-3 Fatty Acids (FISH OIL PO) Take 1 capsule by mouth every morning.     pravastatin (PRAVACHOL) 80 MG tablet Take 1 tablet (80 mg total) by mouth daily. (Patient taking differently: Take 80 mg by mouth every morning.) 90 tablet 3   predniSONE (DELTASONE) 10 MG tablet Take 2 tablets (20 mg total) by mouth 2 (two) times daily with a meal. 20 tablet 0   Semaglutide, 1 MG/DOSE, (OZEMPIC, 1 MG/DOSE,) 4 MG/3ML SOPN Inject 2 mg into the skin once a week. Friday     sertraline (ZOLOFT) 100 MG tablet Take 2 tablets (200 mg total) by mouth daily. For depression and anxiety. 60 tablet 0   traZODone (DESYREL) 100 MG tablet Take 200 mg by mouth at bedtime.      PTA Medications:  PTA Medications  Medication Sig   sertraline (ZOLOFT) 100 MG tablet Take 2 tablets (200 mg total) by mouth daily. For depression and anxiety.   lisinopril (PRINIVIL,ZESTRIL) 2.5 MG  tablet Take 1 tablet (2.5 mg total) by mouth daily.   pravastatin (PRAVACHOL) 80 MG tablet Take 1 tablet (80 mg total) by mouth daily. (Patient taking differently: Take 80 mg by mouth every morning.)   ezetimibe (ZETIA) 10 MG tablet Take 1 tablet (10 mg total) by mouth daily.   gabapentin (NEURONTIN) 300 MG capsule Take 1-2 capsules (300-600 mg total) by mouth at bedtime. Only take as prescribed.  Husband is to administer medication (Patient taking differently: Take 600 mg by mouth at bedtime.)   gabapentin (NEURONTIN) 100 MG capsule Take 1 capsule (100 mg total) by mouth daily after breakfast. (Patient taking differently: Take 100 mg by mouth every morning.)   amphetamine-dextroamphetamine (ADDERALL) 30 MG tablet Take 30 mg by mouth daily.   Empagliflozin-metFORMIN HCl (SYNJARDY) 12.02-999 MG TABS Take 1 tablet by mouth 2 (two) times daily.   BD PEN NEEDLE NANO 2ND GEN 32G X 4 MM MISC daily.   traZODone (  DESYREL) 100 MG tablet Take 200 mg by mouth at bedtime.   ARIPiprazole (ABILIFY) 10 MG tablet Take 10 mg by mouth every morning.   Semaglutide, 1 MG/DOSE, (OZEMPIC, 1 MG/DOSE,) 4 MG/3ML SOPN Inject 2 mg into the skin once a week. Friday   Omega-3 Fatty Acids (FISH OIL PO) Take 1 capsule by mouth every morning.   Multiple Vitamin (MULTIVITAMIN WITH MINERALS) TABS tablet Take 1 tablet by mouth every morning.   acetaminophen (TYLENOL) 500 MG tablet Take 1 tablet (500 mg total) by mouth every 6 (six) hours as needed. (Patient taking differently: Take 500 mg by mouth every 6 (six) hours as needed for moderate pain.)   aspirin EC 81 MG tablet Take 81 mg by mouth daily. Swallow whole.   Fluticasone Furoate-Vilanterol (BREO ELLIPTA) 50-25 MCG/ACT AEPB Inhale 1 puff into the lungs daily in the afternoon. (Patient not taking: Reported on 12/01/2022)   busPIRone (BUSPAR) 15 MG tablet Take 15 mg by mouth 3 (three) times daily.   Continuous Blood Gluc Sensor (FREESTYLE LIBRE 3 SENSOR) MISC 1 each every 14  (fourteen) days.   NOVOLOG MIX 70/30 FLEXPEN (70-30) 100 UNIT/ML FlexPen Inject 44 Units into the skin 2 (two) times daily.   albuterol (PROVENTIL) (2.5 MG/3ML) 0.083% nebulizer solution Take 2.5 mg by nebulization every 6 (six) hours as needed for wheezing or shortness of breath.   albuterol (VENTOLIN HFA) 108 (90 Base) MCG/ACT inhaler Inhale 1-2 puffs into the lungs every 6 (six) hours as needed for wheezing or shortness of breath.   predniSONE (DELTASONE) 10 MG tablet Take 2 tablets (20 mg total) by mouth 2 (two) times daily with a meal.       01/09/2018   11:07 AM 09/09/2017   10:23 AM 06/10/2017   10:24 AM  Depression screen PHQ 2/9  Decreased Interest 0 0 0  Down, Depressed, Hopeless 0 0 0  PHQ - 2 Score 0 0 0    Flowsheet Row ED from 07/06/2023 in [facility] ED from 06/04/2023 in [facility] Department at [facility] ED from 05/29/2023 in [facility] Emergency Department at [facility]  C-SSRS RISK CATEGORY No Risk No Risk No Risk       Musculoskeletal  Strength & Muscle Tone: within normal limits Gait & Station: normal Patient leans: N/A  Psychiatric Specialty Exam  Presentation  General Appearance:  Appropriate for Environment  Eye Contact: Fair  Speech: Clear and Coherent; Normal Rate  Speech Volume: Normal  Handedness:No data recorded  Mood and Affect  Mood: Anxious; Dysphoric  Affect: Congruent   Thought Process  Thought Processes: Coherent; Goal Directed  Descriptions of Associations:Intact  Orientation:Full (Time, Place and Person)  Thought Content:Logical  Diagnosis of Schizophrenia or Schizoaffective disorder in past: No    Hallucinations:Hallucinations: None  Ideas of Reference:None  Suicidal Thoughts:Suicidal Thoughts: No  Homicidal Thoughts:Homicidal Thoughts: No   Sensorium  Memory: Immediate Good; Recent Good  Judgment: Intact  Insight: Present   Executive Functions   Concentration: Good  Attention Span: Good  Recall: Good  Fund of Knowledge: Good  Language: Good   Psychomotor Activity  Psychomotor Activity: Psychomotor Activity: Normal   Assets  Assets: Communication Skills; Desire for Improvement; Financial Resources/Insurance; Housing; Social Support; Physical Health; Leisure Time   Sleep  Sleep: Sleep: Fair   Nutritional Assessment (For OBS and FBC admissions only) Has the patient had a decrease in food intake/or appetite?: Yes Does the patient have eating habits or behaviors that may  be indicators of an eating disorder including binging or inducing vomiting?: No Has the patient recently lost weight without trying?: 0 Has the patient been eating poorly because of a decreased appetite?: 1 Malnutrition Screening Tool Score: 1    Physical Exam  Physical Exam HENT:     Head: Normocephalic.     Nose: Nose normal.  Eyes:     Pupils: Pupils are equal, round, and reactive to light.  Cardiovascular:     Rate and Rhythm: Normal rate.  Pulmonary:     Effort: Pulmonary effort is normal.  Musculoskeletal:        General: Normal range of motion.     Cervical back: Normal range of motion.  Neurological:     General: No focal deficit present.     Mental Status: She is alert.  Psychiatric:        Mood and Affect: Mood normal.        Behavior: Behavior normal.        Thought Content: Thought content normal.        Judgment: Judgment normal.    Review of Systems  Constitutional: Negative.   HENT: Negative.    Eyes: Negative.   Respiratory: Negative.    Cardiovascular: Negative.   Gastrointestinal: Negative.   Genitourinary: Negative.   Musculoskeletal: Negative.   Skin: Negative.   Neurological: Negative.   Psychiatric/Behavioral:  Positive for substance abuse.    Blood pressure 119/76, pulse 91, temperature 97.9 F (36.6 C), temperature source Oral, resp. rate 18, SpO2 93%. There is no height or weight on file to  calculate BMI.  Demographic Factors:  Caucasian  Loss Factors: NA  Historical Factors: Impulsivity  Risk Reduction Factors:   NA  Continued Clinical Symptoms:  Alcohol/Substance Abuse/Dependencies  Cognitive Features That Contribute To Risk:  Closed-mindedness    Suicide Risk:  Minimal: No identifiable suicidal ideation.  Patients presenting with no risk factors but with morbid ruminations; may be classified as minimal risk based on the severity of the depressive symptoms  Plan Of Care/Follow-up recommendations:  Other:  f/u with out patient provider   Disposition: Patient left AMA  Sindy Guadeloupe, NP 07/07/2023, 5:30 AM

## 2023-07-06 NOTE — ED Provider Notes (Cosign Needed Addendum)
[facility] Continuous Assessment Admission H&P  Date: 07/06/23 Patient Name: [name] MRN: [id number] Chief Complaint: Requesting substance abuse  Diagnoses:  Final diagnoses:  Bipolar I disorder, most recent episode depressed (HCC)  Polysubstance (excluding opioids) dependence (HCC)    HPI: [name] 42 y/o female patient with a psychiatric history of bipolar disorder and Opioid Use Disorder, severe presented to [facility] requesting assistance with detox.    Darshay A Nolon Olsen,  seen face to face by this provider, chart reviewed, and consulted with Dr. Nelly Rout on 07/06/23.  On evaluation [name] requesting Is requesting assistance with detox from "pressed" oxycodone pills. Patient reports chronic history of pain and was receiving pain medication but felt she needed more and was buying off street.  States she was drug tested 2 yrs. ago and tested positive that's when she noticed that there were more in the pressed pill other than oxycodone.  Patient states she wants to detox.  Patient denies suicidal/self-harm/homicidal ideation, psychosis, and paranoia.  Recommending admission to [facility] but no beds available at this time.  Will admit to Continuous assessment until appropriate bed available.   During evaluation [name] is seated in exam room with no noted distress.  She is alert/oriented x 4, calm, cooperative, attentive, and responses were relevant and appropriate to assessment questions.  She spoke in a clear tone at moderate volume, and normal pace, with good eye contact.   She denies suicidal/self-harm/homicidal ideation, psychosis, and paranoia.  Objectively there is no evidence of psychosis/mania or delusional thinking.  She conversed coherently, with goal directed thoughts, and no distractibility, or pre-occupation.       Total Time spent with patient: 45 minutes  Musculoskeletal  Strength & Muscle Tone: within normal  limits Gait & Station: normal Patient leans: N/A  Psychiatric Specialty Exam  Presentation General Appearance:  Appropriate for Environment  Eye Contact: Fair  Speech: Clear and Coherent; Normal Rate  Speech Volume: Normal  Handedness:No data recorded  Mood and Affect  Mood: Anxious; Dysphoric  Affect: Congruent   Thought Process  Thought Processes: Coherent; Goal Directed  Descriptions of Associations:Intact  Orientation:Full (Time, Place and Person)  Thought Content:Logical  Diagnosis of Schizophrenia or Schizoaffective disorder in past: No   Hallucinations:Hallucinations: None  Ideas of Reference:None  Suicidal Thoughts:Suicidal Thoughts: No  Homicidal Thoughts:Homicidal Thoughts: No   Sensorium  Memory: Immediate Good; Recent Good  Judgment: Intact  Insight: Present   Executive Functions  Concentration: Good  Attention Span: Good  Recall: Good  Fund of Knowledge: Good  Language: Good   Psychomotor Activity  Psychomotor Activity: Psychomotor Activity: Normal   Assets  Assets: Communication Skills; Desire for Improvement; Financial Resources/Insurance; Housing; Research scientist (medical); Physical Health; Leisure Time   Sleep  Sleep: Sleep: Fair   Nutritional Assessment (For OBS and FBC admissions only) Has the patient had a decrease in food intake/or appetite?: Yes Does the patient have eating habits or behaviors that may be indicators of an eating disorder including binging or inducing vomiting?: No Has the patient recently lost weight without trying?: 0 Has the patient been eating poorly because of a decreased appetite?: 1 Malnutrition Screening Tool Score: 1    Physical Exam Vitals and nursing note reviewed. Exam conducted with a chaperone present.  Constitutional:      General: She is not in acute distress.    Appearance: Normal appearance. She is not ill-appearing.  HENT:     Head: Normocephalic.  Eyes:      Conjunctiva/sclera: Conjunctivae normal.  Cardiovascular:     Rate and Rhythm: Normal rate.  Pulmonary:     Effort: Pulmonary effort is normal. No respiratory distress.  Musculoskeletal:        General: Normal range of motion.     Cervical back: Normal range of motion.  Skin:    General: Skin is warm and dry.  Neurological:     Mental Status: She is alert and oriented to person, place, and time.  Psychiatric:        Attention and Perception: Attention and perception normal. She does not perceive visual hallucinations.        Mood and Affect: Mood is anxious and depressed.    Review of Systems  Constitutional:        No other complaints voiced   Psychiatric/Behavioral:  Positive for depression and substance abuse. Hallucinations: Denies. Suicidal ideas: Denies.The patient is nervous/anxious and has insomnia.   All other systems reviewed and are negative.   There were no vitals taken for this visit. There is no height or weight on file to calculate BMI.  Past Psychiatric History: bipolar disorder and Opioid Use Disorder, severe   Is the patient at risk to self? No  Has the patient been a risk to self in the past 6 months? No .    Has the patient been a risk to self within the distant past? No   Is the patient a risk to others? No   Has the patient been a risk to others in the past 6 months? No   Has the patient been a risk to others within the distant past? No   Past Medical History:  Past Medical History:  Diagnosis Date   Anxiety    Asthma    Back pain    CAP (community acquired pneumonia)    Chronic pain syndrome    Depression    Diabetes mellitus without complication (HCC)    Gallbladder problem    Obsessive compulsive disorder      Family History:  Family History  Problem Relation Age of Onset   Diabetes insipidus Father    Arthritis Father    Heart failure Father    COPD Father    Emphysema Father    Asthma Son      Social History:  Social History    Tobacco Use   Smoking status: Every Day    Current packs/day: 0.50    Average packs/day: 0.5 packs/day for 23.0 years (11.5 ttl pk-yrs)    Types: Cigarettes   Smokeless tobacco: Never  Vaping Use   Vaping status: Every Day   Substances: Nicotine, Flavoring  Substance Use Topics   Alcohol use: Not Currently    Comment: unsure amount (1/6) - states generally does not drink   Drug use: No     Last Labs:  Admission on 07/06/2023  Component Date Value Ref Range Status   Preg Test, Ur 07/06/2023 Negative  Negative Final  Admission on 06/04/2023, Discharged on 06/04/2023  Component Date Value Ref Range Status   WBC 06/04/2023 17.2 (H)  4.0 - 10.5 K/uL Final   RBC 06/04/2023 4.60  3.87 - 5.11 MIL/uL Final   Hemoglobin 06/04/2023 14.2  12.0 - 15.0 g/dL Final   HCT 16/07/9603 41.7  36.0 - 46.0 % Final   MCV 06/04/2023 90.7  80.0 - 100.0 fL Final   MCH 06/04/2023 30.9  26.0 - 34.0 pg Final   MCHC 06/04/2023 34.1  30.0 - 36.0 g/dL Final   RDW 16/07/9603 12.9  11.5 - 15.5 % Final   Platelets 06/04/2023 334  150 - 400 K/uL Final   nRBC 06/04/2023 0.0  0.0 - 0.2 % Final   Neutrophils Relative % 06/04/2023 60  % Final   Neutro Abs 06/04/2023 10.2 (H)  1.7 - 7.7 K/uL Final   Lymphocytes Relative 06/04/2023 33  % Final   Lymphs Abs 06/04/2023 5.6 (H)  0.7 - 4.0 K/uL Final   Monocytes Relative 06/04/2023 5  % Final   Monocytes Absolute 06/04/2023 0.9  0.1 - 1.0 K/uL Final   Eosinophils Relative 06/04/2023 1  % Final   Eosinophils Absolute 06/04/2023 0.2  0.0 - 0.5 K/uL Final   Basophils Relative 06/04/2023 0  % Final   Basophils Absolute 06/04/2023 0.1  0.0 - 0.1 K/uL Final   Immature Granulocytes 06/04/2023 1  % Final   Abs Immature Granulocytes 06/04/2023 0.12 (H)  0.00 - 0.07 K/uL Final   Performed at [facility], [address] [place], [place] [address]   Sodium 06/04/2023 133 (L)  135 - 145 mmol/L Final   Potassium 06/04/2023 4.0  3.5 - 5.1 mmol/L Final    Chloride 06/04/2023 94 (L)  98 - 111 mmol/L Final   CO2 06/04/2023 33 (H)  22 - 32 mmol/L Final   Glucose, Bld 06/04/2023 287 (H)  70 - 99 mg/dL Final   Glucose reference range applies only to samples taken after fasting for at least 8 hours.   BUN 06/04/2023 8  6 - 20 mg/dL Final   Creatinine, Ser 06/04/2023 0.63  0.44 - 1.00 mg/dL Final   Calcium 11/91/4782 8.3 (L)  8.9 - 10.3 mg/dL Final   GFR, Estimated 06/04/2023 >60  >60 mL/min Final   Comment: (NOTE) Calculated using the CKD-EPI Creatinine Equation (2021)    Anion gap 06/04/2023 6  5 - 15 Final   Performed at [facility], [address], [place], [place] [address]   Preg Test, Ur 06/04/2023 NEGATIVE  NEGATIVE Final   Comment:        THE SENSITIVITY OF THIS METHODOLOGY IS >25 mIU/mL. Performed at [facility], [address], [place], [place] [address]    Color, Urine 06/04/2023 YELLOW  YELLOW Final   APPearance 06/04/2023 CLEAR  CLEAR Final   Specific Gravity, Urine 06/04/2023 1.039 (H)  1.005 - 1.030 Final   pH 06/04/2023 7.0  5.0 - 8.0 Final   Glucose, UA 06/04/2023 >1,000 (A)  NEGATIVE mg/dL Final   Hgb urine dipstick 06/04/2023 NEGATIVE  NEGATIVE Final   Bilirubin Urine 06/04/2023 NEGATIVE  NEGATIVE Final   Ketones, ur 06/04/2023 NEGATIVE  NEGATIVE mg/dL Final   Protein, ur 78/46/9629 NEGATIVE  NEGATIVE mg/dL Final   Nitrite 52/84/1324 NEGATIVE  NEGATIVE Final   Leukocytes,Ua 06/04/2023 SMALL (A)  NEGATIVE Final   RBC / HPF 06/04/2023 0-5  0 - 5 RBC/hpf Final   WBC, UA 06/04/2023 11-20  0 - 5 WBC/hpf Final   Bacteria, UA 06/04/2023 RARE (A)  NONE SEEN Final   Squamous Epithelial / HPF 06/04/2023 0-5  0 - 5 /HPF Final   Performed at [facility], [address], [place], [place] [address]  Admission on 05/29/2023, Discharged on 05/29/2023  Component Date Value Ref Range Status   Glucose-Capillary 05/29/2023 430 (H)  70 - 99 mg/dL Final   Glucose reference  range applies only to samples taken after fasting for at least 8 hours.  WBC 05/29/2023 8.2  4.0 - 10.5 K/uL Final   RBC 05/29/2023 4.39  3.87 - 5.11 MIL/uL Final   Hemoglobin 05/29/2023 13.7  12.0 - 15.0 g/dL Final   HCT 35/00/9381 39.6  36.0 - 46.0 % Final   MCV 05/29/2023 90.2  80.0 - 100.0 fL Final   MCH 05/29/2023 31.2  26.0 - 34.0 pg Final   MCHC 05/29/2023 34.6  30.0 - 36.0 g/dL Final   RDW 82/99/3716 12.6  11.5 - 15.5 % Final   Platelets 05/29/2023 311  150 - 400 K/uL Final   nRBC 05/29/2023 0.0  0.0 - 0.2 % Final   Neutrophils Relative % 05/29/2023 60  % Final   Neutro Abs 05/29/2023 5.0  1.7 - 7.7 K/uL Final   Lymphocytes Relative 05/29/2023 26  % Final   Lymphs Abs 05/29/2023 2.1  0.7 - 4.0 K/uL Final   Monocytes Relative 05/29/2023 6  % Final   Monocytes Absolute 05/29/2023 0.5  0.1 - 1.0 K/uL Final   Eosinophils Relative 05/29/2023 7  % Final   Eosinophils Absolute 05/29/2023 0.6 (H)  0.0 - 0.5 K/uL Final   Basophils Relative 05/29/2023 1  % Final   Basophils Absolute 05/29/2023 0.1  0.0 - 0.1 K/uL Final   Immature Granulocytes 05/29/2023 0  % Final   Abs Immature Granulocytes 05/29/2023 0.03  0.00 - 0.07 K/uL Final   Performed at [facility], [address] [place], [place] [address]   Sodium 05/29/2023 135  135 - 145 mmol/L Final   Potassium 05/29/2023 4.1  3.5 - 5.1 mmol/L Final   Chloride 05/29/2023 98  98 - 111 mmol/L Final   CO2 05/29/2023 27  22 - 32 mmol/L Final   Glucose, Bld 05/29/2023 424 (H)  70 - 99 mg/dL Final   Glucose reference range applies only to samples taken after fasting for at least 8 hours.   BUN 05/29/2023 7  6 - 20 mg/dL Final   Creatinine, Ser 05/29/2023 0.55  0.44 - 1.00 mg/dL Final   Calcium 38/07/1750 9.4  8.9 - 10.3 mg/dL Final   GFR, Estimated 05/29/2023 >60  >60 mL/min Final   Comment: (NOTE) Calculated using the CKD-EPI Creatinine Equation (2021)    Anion gap 05/29/2023 10  5 - 15 Final   Performed at [facility], [address], [place], [place] [address]   Glucose-Capillary 05/29/2023 310 (H)  70 - 99 mg/dL Final   Glucose reference range applies only to samples taken after fasting for at least 8 hours.  Admission on 02/04/2023, Discharged on 02/04/2023  Component Date Value Ref Range Status   Sodium 02/03/2023 131 (L)  135 - 145 mmol/L Final   Potassium 02/03/2023 4.0  3.5 - 5.1 mmol/L Final   Chloride 02/03/2023 93 (L)  98 - 111 mmol/L Final   CO2 02/03/2023 29  22 - 32 mmol/L Final   Glucose, Bld 02/03/2023 366 (H)  70 - 99 mg/dL Final   Glucose reference range applies only to samples taken after fasting for at least 8 hours.   BUN 02/03/2023 9  6 - 20 mg/dL Final   Creatinine, Ser 02/03/2023 0.62  0.44 - 1.00 mg/dL Final   Calcium 27/78/2423 8.8 (L)  8.9 - 10.3 mg/dL Final   Total Protein 53/61/4431 6.4 (L)  6.5 - 8.1 g/dL Final   Albumin 54/00/8676 3.9  3.5 - 5.0 g/dL Final   AST 19/50/9326 13 (L)  15 - 41 U/L Final   ALT  02/03/2023 12  0 - 44 U/L Final   Alkaline Phosphatase 02/03/2023 82  38 - 126 U/L Final   Total Bilirubin 02/03/2023 0.3  0.3 - 1.2 mg/dL Final   GFR, Estimated 02/03/2023 >60  >60 mL/min Final   Comment: (NOTE) Calculated using the CKD-EPI Creatinine Equation (2021)    Anion gap 02/03/2023 9  5 - 15 Final   Performed at [facility], [address], [place], [place] [address]   Color, Urine 02/03/2023 YELLOW  YELLOW Final   APPearance 02/03/2023 CLEAR  CLEAR Final   Specific Gravity, Urine 02/03/2023 1.033 (H)  1.005 - 1.030 Final   pH 02/03/2023 5.5  5.0 - 8.0 Final   Glucose, UA 02/03/2023 >1,000 (A)  NEGATIVE mg/dL Final   Hgb urine dipstick 02/03/2023 NEGATIVE  NEGATIVE Final   Bilirubin Urine 02/03/2023 NEGATIVE  NEGATIVE Final   Ketones, ur 02/03/2023 NEGATIVE  NEGATIVE mg/dL Final   Protein, ur 62/13/0865 NEGATIVE  NEGATIVE mg/dL Final   Nitrite 78/46/9629 NEGATIVE  NEGATIVE Final   Leukocytes,Ua 02/03/2023  NEGATIVE  NEGATIVE Final   RBC / HPF 02/03/2023 0-5  0 - 5 RBC/hpf Final   WBC, UA 02/03/2023 0-5  0 - 5 WBC/hpf Final   Bacteria, UA 02/03/2023 NONE SEEN  NONE SEEN Final   Squamous Epithelial / HPF 02/03/2023 0-5  0 - 5 /HPF Final   Performed at [facility], [address], [place], [place] [address]   WBC 02/03/2023 12.6 (H)  4.0 - 10.5 K/uL Final   RBC 02/03/2023 4.30  3.87 - 5.11 MIL/uL Final   Hemoglobin 02/03/2023 13.5  12.0 - 15.0 g/dL Final   HCT 32/44/0102 38.5  36.0 - 46.0 % Final   MCV 02/03/2023 89.5  80.0 - 100.0 fL Final   MCH 02/03/2023 31.4  26.0 - 34.0 pg Final   MCHC 02/03/2023 35.1  30.0 - 36.0 g/dL Final   RDW 72/53/6644 12.7  11.5 - 15.5 % Final   Platelets 02/03/2023 353  150 - 400 K/uL Final   nRBC 02/03/2023 0.0  0.0 - 0.2 % Final   Neutrophils Relative % 02/03/2023 56  % Final   Neutro Abs 02/03/2023 7.1  1.7 - 7.7 K/uL Final   Lymphocytes Relative 02/03/2023 31  % Final   Lymphs Abs 02/03/2023 3.9  0.7 - 4.0 K/uL Final   Monocytes Relative 02/03/2023 8  % Final   Monocytes Absolute 02/03/2023 1.0  0.1 - 1.0 K/uL Final   Eosinophils Relative 02/03/2023 4  % Final   Eosinophils Absolute 02/03/2023 0.6 (H)  0.0 - 0.5 K/uL Final   Basophils Relative 02/03/2023 1  % Final   Basophils Absolute 02/03/2023 0.1  0.0 - 0.1 K/uL Final   Immature Granulocytes 02/03/2023 0  % Final   Abs Immature Granulocytes 02/03/2023 0.03  0.00 - 0.07 K/uL Final   Performed at [facility], [address], [place], [place] [address]   Glucose-Capillary 02/04/2023 329 (H)  70 - 99 mg/dL Final   Glucose reference range applies only to samples taken after fasting for at least 8 hours.   B Natriuretic Peptide 02/04/2023 93.0  0.0 - 100.0 pg/mL Final   Performed at [facility], [address], [place], [place] [address]   D-Dimer, Quant 02/04/2023 0.50  0.00 - 0.50 ug/mL-FEU Final   Comment: (NOTE) At the manufacturer cut-off  value of 0.5 g/mL FEU, this assay has a negative predictive value of 95-100%.This assay is intended for use  in conjunction with a clinical pretest probability (PTP) assessment model to exclude pulmonary embolism (PE) and deep venous thrombosis (DVT) in outpatients suspected of PE or DVT. Results should be correlated with clinical presentation. Performed at [facility], [address] [place], [place] [address]     Allergies: Orange oil, Penicillins, Carisoprodol, and Piroxicam  Medications:  Facility Ordered Medications  Medication   acetaminophen (TYLENOL) tablet 650 mg   alum & mag hydroxide-simeth (MAALOX/MYLANTA) 200-200-20 MG/5ML suspension 30 mL   magnesium hydroxide (MILK OF MAGNESIA) suspension 30 mL   traZODone (DESYREL) tablet 50 mg   aspirin EC tablet 81 mg   busPIRone (BUSPAR) tablet 15 mg   albuterol (PROVENTIL) (2.5 MG/3ML) 0.083% nebulizer solution 2.5 mg   sertraline (ZOLOFT) tablet 200 mg   traZODone (DESYREL) tablet 200 mg   [START ON 07/07/2023] pravastatin (PRAVACHOL) tablet 80 mg   insulin aspart protamine - aspart (NOVOLOG 70/30 MIX) FlexPen 44 Units   gabapentin (NEURONTIN) capsule 300 mg   [START ON 07/07/2023] ARIPiprazole (ABILIFY) tablet 10 mg   [START ON 07/07/2023] multivitamin with minerals tablet 1 tablet   PTA Medications  Medication Sig   sertraline (ZOLOFT) 100 MG tablet Take 2 tablets (200 mg total) by mouth daily. For depression and anxiety.   lisinopril (PRINIVIL,ZESTRIL) 2.5 MG tablet Take 1 tablet (2.5 mg total) by mouth daily.   pravastatin (PRAVACHOL) 80 MG tablet Take 1 tablet (80 mg total) by mouth daily. (Patient taking differently: Take 80 mg by mouth every morning.)   ezetimibe (ZETIA) 10 MG tablet Take 1 tablet (10 mg total) by mouth daily.   gabapentin (NEURONTIN) 300 MG capsule Take 1-2 capsules (300-600 mg total) by mouth at bedtime. Only take as prescribed.  Husband is to administer medication (Patient taking  differently: Take 600 mg by mouth at bedtime.)   gabapentin (NEURONTIN) 100 MG capsule Take 1 capsule (100 mg total) by mouth daily after breakfast. (Patient taking differently: Take 100 mg by mouth every morning.)   amphetamine-dextroamphetamine (ADDERALL) 30 MG tablet Take 30 mg by mouth daily.   Empagliflozin-metFORMIN HCl (SYNJARDY) 12.02-999 MG TABS Take 1 tablet by mouth 2 (two) times daily.   BD PEN NEEDLE NANO 2ND GEN 32G X 4 MM MISC daily.   traZODone (DESYREL) 100 MG tablet Take 200 mg by mouth at bedtime.   ARIPiprazole (ABILIFY) 10 MG tablet Take 10 mg by mouth every morning.   Semaglutide, 1 MG/DOSE, (OZEMPIC, 1 MG/DOSE,) 4 MG/3ML SOPN Inject 2 mg into the skin once a week. Friday   Omega-3 Fatty Acids (FISH OIL PO) Take 1 capsule by mouth every morning.   Multiple Vitamin (MULTIVITAMIN WITH MINERALS) TABS tablet Take 1 tablet by mouth every morning.   acetaminophen (TYLENOL) 500 MG tablet Take 1 tablet (500 mg total) by mouth every 6 (six) hours as needed. (Patient taking differently: Take 500 mg by mouth every 6 (six) hours as needed for moderate pain.)   aspirin EC 81 MG tablet Take 81 mg by mouth daily. Swallow whole.   Fluticasone Furoate-Vilanterol (BREO ELLIPTA) 50-25 MCG/ACT AEPB Inhale 1 puff into the lungs daily in the afternoon. (Patient not taking: Reported on 12/01/2022)   busPIRone (BUSPAR) 15 MG tablet Take 15 mg by mouth 3 (three) times daily.   Continuous Blood Gluc Sensor (FREESTYLE LIBRE 3 SENSOR) MISC 1 each every 14 (fourteen) days.   NOVOLOG MIX 70/30 FLEXPEN (70-30) 100 UNIT/ML FlexPen Inject 44 Units into the skin 2 (two) times daily.   albuterol (  PROVENTIL) (2.5 MG/3ML) 0.083% nebulizer solution Take 2.5 mg by nebulization every 6 (six) hours as needed for wheezing or shortness of breath.   albuterol (VENTOLIN HFA) 108 (90 Base) MCG/ACT inhaler Inhale 1-2 puffs into the lungs every 6 (six) hours as needed for wheezing or shortness of breath.   predniSONE  (DELTASONE) 10 MG tablet Take 2 tablets (20 mg total) by mouth 2 (two) times daily with a meal.      Medical Decision Making  Pollyann A Eliya Wilt was admitted to [facility] continuous assessment unit Polysubstance abuse [facility]), crisis management, and stabilization. Routine labs ordered, which include Lab Orders         CBC with Differential/Platelet         Comprehensive metabolic panel         Hemoglobin A1c         Magnesium         Ethanol         Lipid panel         TSH         Urinalysis, Routine w reflex microscopic -Urine, Clean Catch         Prolactin         POC urine preg, ED         POCT Urine Drug Screen - (I-Screen)    Medication Management: Medications started Meds ordered this encounter  Medications   acetaminophen (TYLENOL) tablet 650 mg   alum & mag hydroxide-simeth (MAALOX/MYLANTA) 200-200-20 MG/5ML suspension 30 mL   magnesium hydroxide (MILK OF MAGNESIA) suspension 30 mL   traZODone (DESYREL) tablet 50 mg   aspirin EC tablet 81 mg   busPIRone (BUSPAR) tablet 15 mg   DISCONTD: albuterol (VENTOLIN HFA) 108 (90 Base) MCG/ACT inhaler 1-2 puff   albuterol (PROVENTIL) (2.5 MG/3ML) 0.083% nebulizer solution 2.5 mg   sertraline (ZOLOFT) tablet 200 mg   traZODone (DESYREL) tablet 200 mg   pravastatin (PRAVACHOL) tablet 80 mg   insulin aspart protamine - aspart (NOVOLOG 70/30 MIX) FlexPen 44 Units   DISCONTD: Semaglutide (1 MG/DOSE) SOPN 2 mg   gabapentin (NEURONTIN) capsule 300 mg   ARIPiprazole (ABILIFY) tablet 10 mg   multivitamin with minerals tablet 1 tablet    Will maintain observation checks every 15 minutes for safety. Psychosocial education regarding relapse prevention and self-care; social and communication  Social work will consult with family for collateral information and discuss discharge and follow up plan.    Recommendations  Based on my evaluation the patient does not appear to have an emergency medical  condition.  Esvin Hnat, NP 07/06/23  4:29 PM

## 2023-07-06 NOTE — ED Notes (Signed)
Pt calm and cooperative no c/o pain or distress denies SI will continue  to monitor for safety

## 2023-07-06 NOTE — Progress Notes (Signed)
   07/06/23 1500  [facility] Triage Screening (Walk-ins at [facility] only)  How Did You Hear About [place]? Self  What Is the Reason for Your Visit/Call Today? Patient is a 41 y.o. female with a hx of Bipolar Disorder and Opioid Use Disorder, severe who presents requesting detox from "pressed" oxycodone pills.  Patient states she has been dealing with chronic pain beginning after an epidural administration when she had her son 26yrs ago. She states she has since been Dx with Degenerative Disc Disease, a bulging disc and bone spurs.  Patient was seeing Merryl Hacker at a pain management clinic on [address].  She was tapered up several times to a 15mg  dose.  She states when this was no longer providing relief for her pain, she began supplementing with Oxycodone she bought from a "friend of a friend."  Dr. Lucretia Field ordered a routine drug screen approximately 2 yrs ago and results were positive for fentanyl and heroin, per patient report.  Dr. Lucretia Field discharged patient at that point, due to concerns for substance use.  Patient states she then learned the "pills were pressed with fentanyl and heroin."  She has continued to used the pressed pills for two years, taking multiple pills throughout the day.  She has recently begun snorting a powder form of the pressed pill.  She snorts this powder every two hours at this point.  Paitent denies SI, HI and AVH.  She is seeking detox from opiates and states that when she is clean, she plans to pursue pain management again.  How Long Has This Been Causing You Problems? > than 6 months  Have You Recently Had Any Thoughts About Hurting Yourself? No  Are You Planning to Commit Suicide/Harm Yourself At This time? No  Have you Recently Had Thoughts About Hurting Someone [name]? No  Are You Planning To Harm Someone At This Time? No  Are you currently experiencing any auditory, visual or other hallucinations? No  Have You Used Any Alcohol or Drugs in the Past 24 Hours? Yes  How long ago  did you use Drugs or Alcohol? several hours ago, PTA  What Did You Use and How Much? unknown - snorted the powder form of pressed oxy, laced with fentanyl and heroin  Do you have any current medical co-morbidities that require immediate attention? No  Clinician description of patient physical appearance/behavior: Patient is tearful, cooperative AAOx5  What Do You Feel Would Help You the Most Today? Treatment for Depression or other mood problem;Alcohol or Drug Use Treatment  If access to [facility] was not available, would you have sought care in the Emergency Department? No  Determination of Need Urgent (48 hours)  Options For Referral Facility-Based Crisis

## 2023-07-07 LAB — PROLACTIN: Prolactin: 11.2 ng/mL (ref 4.8–33.4)

## 2023-08-16 ENCOUNTER — Emergency Department (HOSPITAL_COMMUNITY): Payer: Managed Care, Other (non HMO)

## 2023-08-16 ENCOUNTER — Other Ambulatory Visit: Payer: Self-pay

## 2023-08-16 ENCOUNTER — Emergency Department (HOSPITAL_COMMUNITY)
Admission: EM | Admit: 2023-08-16 | Discharge: 2023-08-16 | Disposition: A | Discharge status: Home / Self Care | Payer: Managed Care, Other (non HMO) | Attending: Emergency Medicine | Admitting: Emergency Medicine

## 2023-08-16 ENCOUNTER — Encounter (HOSPITAL_COMMUNITY): Payer: Self-pay | Admitting: Emergency Medicine

## 2023-08-16 DIAGNOSIS — Z7982 Long term (current) use of aspirin: Secondary | ICD-10-CM | POA: Insufficient documentation

## 2023-08-16 DIAGNOSIS — Z7951 Long term (current) use of inhaled steroids: Secondary | ICD-10-CM | POA: Diagnosis not present

## 2023-08-16 DIAGNOSIS — Z794 Long term (current) use of insulin: Secondary | ICD-10-CM | POA: Insufficient documentation

## 2023-08-16 DIAGNOSIS — J45909 Unspecified asthma, uncomplicated: Secondary | ICD-10-CM | POA: Insufficient documentation

## 2023-08-16 DIAGNOSIS — Z7984 Long term (current) use of oral hypoglycemic drugs: Secondary | ICD-10-CM | POA: Diagnosis not present

## 2023-08-16 DIAGNOSIS — J40 Bronchitis, not specified as acute or chronic: Secondary | ICD-10-CM | POA: Insufficient documentation

## 2023-08-16 DIAGNOSIS — E119 Type 2 diabetes mellitus without complications: Secondary | ICD-10-CM | POA: Insufficient documentation

## 2023-08-16 DIAGNOSIS — R0789 Other chest pain: Secondary | ICD-10-CM

## 2023-08-16 DIAGNOSIS — R0602 Shortness of breath: Secondary | ICD-10-CM | POA: Diagnosis present

## 2023-08-16 LAB — RAPID URINE DRUG SCREEN, HOSP PERFORMED
Amphetamines: NOT DETECTED
Barbiturates: NOT DETECTED
Benzodiazepines: NOT DETECTED
Cocaine: NOT DETECTED
Opiates: POSITIVE — AB
Tetrahydrocannabinol: NOT DETECTED

## 2023-08-16 LAB — BASIC METABOLIC PANEL
Anion gap: 13 (ref 5–15)
BUN: 9 mg/dL (ref 6–20)
CO2: 26 mmol/L (ref 22–32)
Calcium: 8.9 mg/dL (ref 8.9–10.3)
Chloride: 96 mmol/L — ABNORMAL LOW (ref 98–111)
Creatinine, Ser: 0.62 mg/dL (ref 0.44–1.00)
GFR, Estimated: >60 mL/min (ref >60)
Glucose, Bld: 234 mg/dL — ABNORMAL HIGH (ref 70–99)
Potassium: 4.4 mmol/L (ref 3.5–5.1)
Sodium: 135 mmol/L (ref 135–145)

## 2023-08-16 LAB — CBC
HCT: 43.5 % (ref 36.0–46.0)
Hemoglobin: 14.7 g/dL (ref 12.0–15.0)
MCH: 30.7 pg (ref 26.0–34.0)
MCHC: 33.8 g/dL (ref 30.0–36.0)
MCV: 90.8 fL (ref 80.0–100.0)
Platelets: 358 10*3/uL (ref 150–400)
RBC: 4.79 MIL/uL (ref 3.87–5.11)
RDW: 12.2 % (ref 11.5–15.5)
WBC: 9.8 10*3/uL (ref 4.0–10.5)
nRBC: 0 % (ref 0.0–0.2)

## 2023-08-16 LAB — CBG MONITORING, ED: Glucose-Capillary: 239 mg/dL — ABNORMAL HIGH (ref 70–99)

## 2023-08-16 LAB — TROPONIN I (HIGH SENSITIVITY)
Troponin I (High Sensitivity): 4 ng/L (ref <18)
Troponin I (High Sensitivity): 3 ng/L (ref <18)

## 2023-08-16 MED ORDER — IOHEXOL 350 MG/ML SOLN
55.0000 mL | Freq: Once | INTRAVENOUS | Status: AC | PRN
  Administered 2023-08-16: 55 mL via INTRAVENOUS

## 2023-08-16 MED ORDER — NITROGLYCERIN 2 % TD OINT
0.5000 [in_us] | TOPICAL_OINTMENT | Freq: Once | TRANSDERMAL | Status: AC
  Administered 2023-08-16: 0.5 [in_us] via TOPICAL
  Filled 2023-08-16: qty 1

## 2023-08-16 MED ORDER — PREDNISONE 20 MG PO TABS
40.0000 mg | ORAL_TABLET | Freq: Every day | ORAL | 0 refills | Status: AC
Start: 2023-08-16 — End: 2023-08-21

## 2023-08-16 MED ORDER — LORAZEPAM 2 MG/ML IJ SOLN
1.0000 mg | Freq: Once | INTRAMUSCULAR | Status: AC
  Administered 2023-08-16: 1 mg via INTRAVENOUS
  Filled 2023-08-16: qty 1

## 2023-08-16 MED ORDER — IOHEXOL 350 MG/ML SOLN
75.0000 mL | Freq: Once | INTRAVENOUS | Status: AC | PRN
  Administered 2023-08-16: 75 mL via INTRAVENOUS

## 2023-08-16 NOTE — ED Provider Notes (Signed)
EMERGENCY DEPARTMENT AT [facility] Provider Note   CSN: [id number] Arrival date & time: 08/16/23  1209     History  Chief Complaint  Patient presents with   Chest Pain    [name] is a 42 y.o. female with past medical history of OCD, anxiety, depression, chronic back pain, diabetes, asthma, bipolar 1 presents to emergency department for evaluation of left-sided chest pain, left rib pain, and mild shortness of breath that started this morning when she was driving to her doctor's office.  She denies fevers, cough, nasal congestion   Chest Pain Associated symptoms: shortness of breath   Associated symptoms: no abdominal pain, no cough, no dizziness, no fatigue, no fever, no headache, no nausea, no numbness, no palpitations, no vomiting and no weakness        Home Medications Prior to Admission medications   Medication Sig Start Date End Date Taking? Authorizing Provider  acetaminophen (TYLENOL) 500 MG tablet Take 1 tablet (500 mg total) by mouth every 6 (six) hours as needed. Patient taking differently: Take 500 mg by mouth every 6 (six) hours as needed for moderate pain. 04/26/22   Redwine, Madison A, PA-C  albuterol (PROVENTIL) (2.5 MG/3ML) 0.083% nebulizer solution Take 2.5 mg by nebulization every 6 (six) hours as needed for wheezing or shortness of breath. 11/29/22 11/29/23  [provider]  albuterol (VENTOLIN HFA) 108 (90 Base) MCG/ACT inhaler Inhale 1-2 puffs into the lungs every 6 (six) hours as needed for wheezing or shortness of breath. 12/02/22   Burnadette Pop, MD  amphetamine-dextroamphetamine (ADDERALL) 30 MG tablet Take 30 mg by mouth daily. 09/28/20   [provider]  ARIPiprazole (ABILIFY) 10 MG tablet Take 10 mg by mouth every morning. 01/21/21   [provider]  aspirin EC 81 MG tablet Take 81 mg by mouth daily. Swallow whole.    [provider]  BD PEN NEEDLE NANO 2ND GEN 32G X 4 MM MISC  daily. 09/23/20   [provider]  busPIRone (BUSPAR) 15 MG tablet Take 15 mg by mouth 3 (three) times daily. 10/14/22   [provider]  Continuous Blood Gluc Sensor (FREESTYLE LIBRE 3 SENSOR) MISC 1 each every 14 (fourteen) days. 11/21/22   [provider]  Empagliflozin-metFORMIN HCl (SYNJARDY) 12.02-999 MG TABS Take 1 tablet by mouth 2 (two) times daily. 02/12/21   [provider]  ezetimibe (ZETIA) 10 MG tablet Take 1 tablet (10 mg total) by mouth daily. 09/26/17   Bing Neighbors, NP  Fluticasone Furoate-Vilanterol (BREO ELLIPTA) 50-25 MCG/ACT AEPB Inhale 1 puff into the lungs daily in the afternoon. Patient not taking: Reported on 12/01/2022 08/13/22   Noralee Stain, DO  gabapentin (NEURONTIN) 100 MG capsule Take 1 capsule (100 mg total) by mouth daily after breakfast. Patient taking differently: Take 100 mg by mouth every morning. 02/14/20   Kerrin Champagne, MD  gabapentin (NEURONTIN) 300 MG capsule Take 1-2 capsules (300-600 mg total) by mouth at bedtime. Only take as prescribed.  Husband is to administer medication Patient taking differently: Take 600 mg by mouth at bedtime. 02/14/20   Kerrin Champagne, MD  lisinopril (PRINIVIL,ZESTRIL) 2.5 MG tablet Take 1 tablet (2.5 mg total) by mouth daily. 05/10/17   Bing Neighbors, NP  Multiple Vitamin (MULTIVITAMIN WITH MINERALS) TABS tablet Take 1 tablet by mouth every morning.    [provider]  NOVOLOG MIX 70/30 FLEXPEN (70-30) 100 UNIT/ML FlexPen Inject 44 Units into the skin 2 (  two) times daily. 11/25/22   [provider]  Omega-3 Fatty Acids (FISH OIL PO) Take 1 capsule by mouth every morning.    [provider]  pravastatin (PRAVACHOL) 80 MG tablet Take 1 tablet (80 mg total) by mouth daily. Patient taking differently: Take 80 mg by mouth every morning. 05/11/17   Bing Neighbors, NP  predniSONE (DELTASONE) 20 MG tablet Take 2 tablets (40 mg total) by mouth daily with breakfast for 5  days. 08/16/23 08/21/23  Gerhard Munch, MD  Semaglutide, 1 MG/DOSE, (OZEMPIC, 1 MG/DOSE,) 4 MG/3ML SOPN Inject 2 mg into the skin once a week. Friday 02/12/21   [provider]  sertraline (ZOLOFT) 100 MG tablet Take 2 tablets (200 mg total) by mouth daily. For depression and anxiety. 06/13/13   Tamala Julian, PA-C  traZODone (DESYREL) 100 MG tablet Take 200 mg by mouth at bedtime. 01/21/21   [provider]      Allergies    Orange oil, Penicillins, Carisoprodol, and Piroxicam    Review of Systems   Review of Systems  Constitutional:  Negative for chills, fatigue and fever.  Respiratory:  Positive for shortness of breath. Negative for cough, chest tightness and wheezing.   Cardiovascular:  Positive for chest pain. Negative for palpitations.  Gastrointestinal:  Negative for abdominal pain, constipation, diarrhea, nausea and vomiting.  Neurological:  Negative for dizziness, seizures, weakness, light-headedness, numbness and headaches.    Physical Exam Updated Vital Signs BP (!) 152/87   Pulse 94   Temp 98.2 F (36.8 C) (Oral)   Resp (!) 21   SpO2 100%  Physical Exam Vitals and nursing note reviewed.  Constitutional:      General: She is in acute distress.     Appearance: Normal appearance. She is diaphoretic.  HENT:     Head: Normocephalic and atraumatic.  Eyes:     Conjunctiva/sclera: Conjunctivae normal.  Cardiovascular:     Rate and Rhythm: Normal rate.  Pulmonary:     Effort: Pulmonary effort is normal. No respiratory distress.  Musculoskeletal:     Comments: Pain with palpation of left chest wall and left lower thoracic cage No obvious crepitus, instability  Skin:    General: Skin is warm.     Coloration: Skin is not jaundiced or pale.  Neurological:     Mental Status: She is alert. Mental status is at baseline.     ED Results / Procedures / Treatments   Labs (all labs ordered are listed, but only abnormal results are displayed) Labs Reviewed   BASIC METABOLIC PANEL - Abnormal; Notable for the following components:      Result Value   Chloride 96 (*)    Glucose, Bld 234 (*)    All other components within normal limits  RAPID URINE DRUG SCREEN, HOSP PERFORMED - Abnormal; Notable for the following components:   Opiates POSITIVE (*)    All other components within normal limits  CBG MONITORING, ED - Abnormal; Notable for the following components:   Glucose-Capillary 239 (*)    All other components within normal limits  CBC  HCG, SERUM, QUALITATIVE  TROPONIN I (HIGH SENSITIVITY)  TROPONIN I (HIGH SENSITIVITY)    EKG EKG Interpretation Date/Time:  Tuesday August 16 2023 12:28:06 EST Ventricular Rate:  83 PR Interval:  158 QRS Duration:  66 QT Interval:  380 QTC Calculation: 446 R Axis:   90  Text Interpretation: Normal sinus rhythm Rightward axis Low voltage QRS Borderline ECG When compared with ECG  of 06-Jul-2023 16:36, PREVIOUS ECG IS PRESENT Confirmed by Gerhard Munch [phone]) on 08/16/2023 12:40:09 PM  Radiology CT Angio Chest PE W/Cm &/Or Wo Cm  Result Date: 08/16/2023 CLINICAL DATA:  Left chest pain and left rib pain. EXAM: CT ANGIOGRAPHY CHEST WITH CONTRAST TECHNIQUE: Multidetector CT imaging of the chest was performed using the standard protocol during bolus administration of intravenous contrast. Multiplanar CT image reconstructions and MIPs were obtained to evaluate the vascular anatomy. RADIATION DOSE REDUCTION: This exam was performed according to the departmental dose-optimization program which includes automated exposure control, adjustment of the mA and/or kV according to patient size and/or use of iterative reconstruction technique. CONTRAST:  55mL OMNIPAQUE IOHEXOL 350 MG/ML SOLN COMPARISON:  12/02/2021 FINDINGS: Cardiovascular: No filling defect is identified in the pulmonary arterial tree to suggest pulmonary embolus. Borderline cardiomegaly. Mediastinum/Nodes: Left hilar lymph node 1.1 cm in short axis,  stable. Borderline prominent bilateral infrahilar lymph nodes. Small mediastinal lymph nodes are observed. Small left supraclavicular lymph nodes. Lungs/Pleura: Mosaic attenuation in the lungs. No overt consolidation. Although nonspecific this could be a manifestation of hypersensitivity pneumonitis or bronchiolitis. Upper Abdomen: Unremarkable Musculoskeletal: Mild thoracic spondylosis. Review of the MIP images confirms the above findings. IMPRESSION: 1. No filling defect is identified in the pulmonary arterial tree to suggest pulmonary embolus. 2. Mosaic attenuation in the lungs, nonspecific but possibly a manifestation of hypersensitivity pneumonitis or bronchiolitis. 3. Borderline cardiomegaly. 4. Mild thoracic spondylosis. 5. Stable borderline prominent left hilar lymph node. Electronically Signed   By: Gaylyn Rong M.D.   On: 08/16/2023 16:25   DG Chest Portable 1 View  Result Date: 08/16/2023 CLINICAL DATA:  Chest pain EXAM: PORTABLE CHEST 1 VIEW COMPARISON:  06/04/2023 FINDINGS: Cardiac and mediastinal contours are within normal limits. Redemonstrated coarsened interstitial markings. No new focal pulmonary opacity. No pleural effusion or pneumothorax. No acute osseous abnormality. IMPRESSION: Redemonstrated coarsened interstitial markings, favored to represent chronic bronchitic changes. No focal pneumonia. Electronically Signed   By: Wiliam Ke M.D.   On: 08/16/2023 15:17    Procedures Procedures    Medications Ordered in ED Medications  nitroGLYCERIN (NITROGLYN) 2 % ointment 0.5 inch (0.5 inches Topical Given 08/16/23 1245)  LORazepam (ATIVAN) injection 1 mg (1 mg Intravenous Given 08/16/23 1330)  iohexol (OMNIPAQUE) 350 MG/ML injection 75 mL (75 mLs Intravenous Contrast Given 08/16/23 1350)  iohexol (OMNIPAQUE) 350 MG/ML injection 55 mL (55 mLs Intravenous Contrast Given 08/16/23 1352)    ED Course/ Medical Decision Making/ A&P Clinical Course as of 08/16/23 2135  Tue Aug 16, 2023   1247 EKG similar to prior  [AB]  1321 Wells score 4.5 moderate risk. Ordered CTA PE  [AB]    Clinical Course User Index [AB] Lockie Mola, MD                                 Medical Decision Making Amount and/or Complexity of Data Reviewed Labs: ordered. Radiology: ordered.  Risk Prescription drug management.   Patient presents to the ED for concern of left-sided chest pain with associated shortness of breath, this involves an extensive number of treatment options, and is a complaint that carries with it a high risk of complications and morbidity.  The differential diagnosis includes ACS, PE, electrolyte abnormality, infection, pneumonia, asthma.  Is not exhaustive list   Co morbidities that complicate the patient evaluation  None   Additional history obtained:  Additional history obtained from  Nursing, Outside  Medical Records, and Past Admission   External records from outside source obtained and reviewed   Lab Tests:  I Ordered, and personally interpreted labs.  The pertinent results include: Hyperglycemia at 239 without anion gap or DKA.  Negative tropes x 2.  UDS significant for opiates with history of opiate use for chronic back pain   Imaging Studies ordered:  I ordered imaging studies including chest x-ray and CTPA I independently visualized and interpreted imaging which showed no acute PE or pneumonia I agree with the radiologist interpretation   Cardiac Monitoring:  The patient was maintained on a cardiac monitor.  I personally viewed and interpreted the cardiac monitored which showed an underlying rhythm of: NSR with no ST or ischemic changes noted   Medicines ordered and prescription drug management:  I ordered medication including NTG  for CP Dr. Jeraldine Loots ordered ativan for anxiety and steroid burst for anti-inflammatory properties Reevaluation of the patient after these medicines showed that the patient improved I have reviewed the patients home  medicines and have made adjustments as needed    Problem List / ED Course:  Atypical chest pain Bronchitis   Reevaluation:  After the interventions noted above, I reevaluated the patient and found that they have :improved   Dispostion:  Upon evaluation, patient is significantly diaphoretic in significant left-sided chest pain.  EKG is not significant for ST or ischemia.  Vital signs WNL.  See HPI.  Chest x-ray significant for chronic bronchitis changes with no focal pneumonia.  Troponins are negative x 2 with EKG negative for ST or ischemic changes.  Likelihood for ACS is low.  CT PA negative for acute pneumonia or PE to explain patient's symptoms.  Patient's symptoms may do due to MSK or chronic bronchitis changes.  After consideration of the diagnostic results and the patients response to treatment, I feel that the patent would benefit from outpatient management with follow-up with primary care and steroid prescription burst.  Patient is well-appearing upon reassessment with negative ED workup, I do not feel that admission is required at this time.  I discussed findings, disposition, return to emergency department precautions with patient expresses understanding agrees with plan.  Return to emergency department precautions include worsening shortness of breath, chest pain, syncope, altered mentation, dizziness  Dr. Jeraldine Loots individually assessed patient and agrees with plan.       Final Clinical Impression(s) / ED Diagnoses Final diagnoses:  Atypical chest pain  Bronchitis    Rx / DC Orders ED Discharge Orders          Ordered    predniSONE (DELTASONE) 20 MG tablet  Daily with breakfast        08/16/23 1641              Judithann Sheen, PA 08/16/23 2135    Gerhard Munch, MD 08/17/23 [phone]

## 2023-08-16 NOTE — ED Triage Notes (Signed)
Pt BIB [facility] from Drs office with reports of chest pain and diaphoresis. Pt was given 324 ASA en route. Pain is worse when breathing. Denies SHOB.

## 2023-08-16 NOTE — ED Provider Triage Note (Signed)
Emergency Medicine Provider Triage Evaluation Note  Meeya A Jaslyn Bansal , a 42 y.o. female  was evaluated in triage.  Pt complains of left-sided chest pain and left rib pain that started this morning when she was driving to her doctor's office.  She denies fever, cough, shortness of breath.  She is diaphoretic upon exam  Review of Systems  Positive: Chest pain, rib pain Negative: fever, cough, shortness of breath  Physical Exam  BP 134/73 (BP Location: Right Arm)   Pulse 76   Temp 98 F (36.7 C) (Oral)   Resp 19   SpO2 99%  Gen:   Awake, diaphoretic Resp:  Normal effort  MSK:   Moves extremities without difficulty  Other:    Medical Decision Making  Medically screening exam initiated at 12:23 PM.  Appropriate orders placed.  Lissett A Joeline Freer was informed that the remainder of the evaluation will be completed by another provider, this initial triage assessment does not replace that evaluation, and the importance of remaining in the ED until their evaluation is complete.  Labwork and imaging ordered   Judithann Sheen, [place] 08/16/23 1226

## 2023-08-16 NOTE — Discharge Instructions (Signed)
Today's evaluation has been reassuring.  Your CT scan, and history suggest bronchitis as a cause of your pain and discomfort.  Please take all medication as prescribed and be sure to follow-up with your primary care physician.  Return here for concerning changes in your condition.

## 2023-11-14 DIAGNOSIS — M7989 Other specified soft tissue disorders: Secondary | ICD-10-CM | POA: Diagnosis present

## 2023-11-14 DIAGNOSIS — I10 Essential (primary) hypertension: Secondary | ICD-10-CM | POA: Insufficient documentation

## 2023-11-14 DIAGNOSIS — Z7982 Long term (current) use of aspirin: Secondary | ICD-10-CM | POA: Diagnosis not present

## 2023-11-14 DIAGNOSIS — E119 Type 2 diabetes mellitus without complications: Secondary | ICD-10-CM | POA: Insufficient documentation

## 2023-11-14 DIAGNOSIS — F17210 Nicotine dependence, cigarettes, uncomplicated: Secondary | ICD-10-CM | POA: Insufficient documentation

## 2023-11-14 DIAGNOSIS — Z794 Long term (current) use of insulin: Secondary | ICD-10-CM | POA: Diagnosis not present

## 2023-11-14 DIAGNOSIS — J45909 Unspecified asthma, uncomplicated: Secondary | ICD-10-CM | POA: Diagnosis not present

## 2023-11-14 DIAGNOSIS — Z7951 Long term (current) use of inhaled steroids: Secondary | ICD-10-CM | POA: Insufficient documentation

## 2023-11-14 DIAGNOSIS — Z79899 Other long term (current) drug therapy: Secondary | ICD-10-CM | POA: Diagnosis not present

## 2023-11-14 DIAGNOSIS — R600 Localized edema: Secondary | ICD-10-CM | POA: Diagnosis not present

## 2023-11-14 DIAGNOSIS — Z7984 Long term (current) use of oral hypoglycemic drugs: Secondary | ICD-10-CM | POA: Diagnosis not present

## 2023-11-14 NOTE — ED Triage Notes (Signed)
Pt is here for bilateral leg swelling from ankles to thighs.  Pt has hx of swelling in her legs but does not know what causes it.  No illness, no sob.

## 2023-11-15 ENCOUNTER — Other Ambulatory Visit (HOSPITAL_BASED_OUTPATIENT_CLINIC_OR_DEPARTMENT_OTHER): Payer: Self-pay

## 2023-11-15 ENCOUNTER — Encounter (HOSPITAL_BASED_OUTPATIENT_CLINIC_OR_DEPARTMENT_OTHER): Payer: Self-pay | Admitting: *Deleted

## 2023-11-15 ENCOUNTER — Other Ambulatory Visit: Payer: Self-pay

## 2023-11-15 ENCOUNTER — Emergency Department (HOSPITAL_BASED_OUTPATIENT_CLINIC_OR_DEPARTMENT_OTHER): Payer: Managed Care, Other (non HMO)

## 2023-11-15 ENCOUNTER — Emergency Department (HOSPITAL_BASED_OUTPATIENT_CLINIC_OR_DEPARTMENT_OTHER)
Admission: EM | Admit: 2023-11-15 | Discharge: 2023-11-15 | Disposition: A | Discharge status: Home / Self Care | Payer: Managed Care, Other (non HMO) | Attending: Emergency Medicine | Admitting: Emergency Medicine

## 2023-11-15 DIAGNOSIS — M7989 Other specified soft tissue disorders: Secondary | ICD-10-CM

## 2023-11-15 LAB — BASIC METABOLIC PANEL
Anion gap: 6 (ref 5–15)
BUN: 7 mg/dL (ref 6–20)
CO2: 31 mmol/L (ref 22–32)
Calcium: 9 mg/dL (ref 8.9–10.3)
Chloride: 99 mmol/L (ref 98–111)
Creatinine, Ser: 0.64 mg/dL (ref 0.44–1.00)
GFR, Estimated: >60 mL/min (ref >60)
Glucose, Bld: 183 mg/dL — ABNORMAL HIGH (ref 70–99)
Potassium: 4.2 mmol/L (ref 3.5–5.1)
Sodium: 136 mmol/L (ref 135–145)

## 2023-11-15 LAB — CBC
HCT: 40.4 % (ref 36.0–46.0)
Hemoglobin: 13.9 g/dL (ref 12.0–15.0)
MCH: 30.9 pg (ref 26.0–34.0)
MCHC: 34.4 g/dL (ref 30.0–36.0)
MCV: 89.8 fL (ref 80.0–100.0)
Platelets: 408 10*3/uL — ABNORMAL HIGH (ref 150–400)
RBC: 4.5 MIL/uL (ref 3.87–5.11)
RDW: 12.4 % (ref 11.5–15.5)
WBC: 9.8 10*3/uL (ref 4.0–10.5)
nRBC: 0 % (ref 0.0–0.2)

## 2023-11-15 LAB — BRAIN NATRIURETIC PEPTIDE: B Natriuretic Peptide: 92.2 pg/mL (ref 0.0–100.0)

## 2023-11-15 LAB — TSH: TSH: 3.226 u[IU]/mL (ref 0.350–4.500)

## 2023-11-15 MED ORDER — FUROSEMIDE 20 MG PO TABS: 20.0000 mg | ORAL_TABLET | Freq: Every day | ORAL | 0 refills | Status: DC

## 2023-11-15 MED ORDER — FUROSEMIDE 20 MG PO TABS
20.0000 mg | ORAL_TABLET | Freq: Every day | ORAL | 0 refills | Status: AC
Start: 2023-11-15 — End: 2023-11-22
  Filled 2023-11-15: qty 7, 7d supply, fill #0

## 2023-11-15 MED ORDER — KETOROLAC TROMETHAMINE 15 MG/ML IJ SOLN
15.0000 mg | Freq: Once | INTRAMUSCULAR | Status: AC
  Administered 2023-11-15: 15 mg via INTRAVENOUS
  Filled 2023-11-15: qty 1

## 2023-11-15 NOTE — ED Notes (Signed)
Patient desaturating to low to mid 80's while sleeping. Per patient, hx of sleep apnea (non-compliant with CPAP). Patient placed on [facility] at this time.

## 2023-11-15 NOTE — Discharge Instructions (Addendum)
 Please use compression stockings and keep your legs elevated when sitting down.  Follow-up with your PCP next 5 days for recheck  It was a pleasure caring for you today in the emergency department.  Please return to the emergency department for any worsening or worrisome symptoms.

## 2023-11-15 NOTE — ED Provider Notes (Signed)
 [place] EMERGENCY DEPARTMENT AT [facility] Provider Note  CSN: [id number] Arrival date & time: 11/14/23 2350  Chief Complaint(s) Leg Swelling  HPI [name] is a 43 y.o. female with past medical history as below, significant for eczema, anxiety, back pain, chronic pain syndrome, chronic leg swelling, OCD, polysubstance abuse who presents to the ED with complaint of leg swelling  She has chronic leg swelling, she has not been using her compression stockings or elevating her legs.  She was seen by her PCP previously for this and in the ER previously for this.  She feels the swelling has gotten worse over the past 2 days, her legs are hurting secondary to the pain from the swelling.  No difficulty breathing, no recent travel or sick contacts.  No history of DVT or PE.  Past Medical History Past Medical History:  Diagnosis Date   Anxiety    Asthma    Back pain    CAP (community acquired pneumonia)    Chronic pain syndrome    Depression    Diabetes mellitus without complication (HCC)    Gallbladder problem    Obsessive compulsive disorder    Patient Active Problem List   Diagnosis Date Noted   Polysubstance abuse (HCC) 07/06/2023   Acute asthma exacerbation 12/01/2022   Essential hypertension 11/30/2022   Anxiety and depression 11/30/2022   Diabetic neuropathy (HCC) 11/30/2022   Acute bronchitis 08/13/2022   Acute severe exacerbation of moderate persistent asthma 08/12/2022   Moderate persistent asthma 12/02/2021   Diaphoresis 12/02/2021   Altered mental status 12/02/2021   Respiratory failure with hypoxia (HCC) 03/09/2021   Diabetes mellitus without complication (HCC)    Type 2 diabetes mellitus with hyperglycemia (HCC) 09/22/2020   Protrusion of lumbar intervertebral disc 03/31/2020   Obesity (BMI 30.0-34.9) 04/11/2019   Neuropathy 04/11/2019   Dyslipidemia 04/11/2019   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    Uncontrolled type 2 diabetes mellitus with hyperglycemia, with long-term current use of insulin  (HCC) 04/11/2019   Overdose of antipsychotic 01/23/2017   Hypoxia 01/17/2017   Diabetes 1.5, 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 Authorizing Provider  acetaminophen  (TYLENOL ) 500 MG tablet Take 1 tablet (500 mg total) by mouth every 6 (six) hours as needed. Patient taking differently: Take 500 mg by mouth every 6 (six) hours as needed for moderate pain. 04/26/22   Redwine, Madison A, PA-C  albuterol  (PROVENTIL ) (2.5 MG/3ML) 0.083% nebulizer solution Take 2.5 mg by nebulization every 6 (six) hours as needed for wheezing or shortness of breath. 11/29/22 11/29/23  [provider]  albuterol  (VENTOLIN  HFA) 108 (90 Base) MCG/ACT inhaler Inhale 1-2 puffs into the lungs every 6 (six) hours as needed for wheezing or shortness of breath. 12/02/22   Jillian Buttery, MD  amphetamine -dextroamphetamine  (ADDERALL) 30 MG tablet Take 30 mg by mouth daily. 09/28/20   [provider]  ARIPiprazole  (ABILIFY ) 10 MG tablet Take 10 mg by mouth every morning. 01/21/21  [provider]  aspirin  EC 81 MG tablet Take 81 mg by mouth daily. Swallow whole.    [provider]  BD PEN NEEDLE NANO 2ND GEN 32G X 4 MM MISC daily. 09/23/20   [provider]  busPIRone  (BUSPAR ) 15 MG tablet Take 15 mg by mouth 3 (three) times  daily. 10/14/22   [provider]  Continuous Blood Gluc Sensor (FREESTYLE LIBRE 3 SENSOR) MISC 1 each every 14 (fourteen) days. 11/21/22   [provider]  Empagliflozin-metFORMIN  HCl (SYNJARDY) 12.02-999 MG TABS Take 1 tablet by mouth 2 (two) times daily. 02/12/21   [provider]  ezetimibe  (ZETIA ) 10 MG tablet Take 1 tablet (10 mg total) by mouth daily. 09/26/17   Arloa Suzen RAMAN, NP  Fluticasone  Furoate-Vilanterol (BREO ELLIPTA ) 50-25 MCG/ACT AEPB Inhale 1 puff into the lungs daily in the afternoon. Patient not taking: Reported on 12/01/2022 08/13/22   Rojelio Nest, DO  furosemide  (LASIX ) 20 MG tablet Take 1 tablet (20 mg total) by mouth daily for 7 days. 11/15/23 11/22/23  Pamella Ozell LABOR, DO  gabapentin  (NEURONTIN ) 100 MG capsule Take 1 capsule (100 mg total) by mouth daily after breakfast. Patient taking differently: Take 100 mg by mouth every morning. 02/14/20   Lucilla Lynwood BRAVO, MD  gabapentin  (NEURONTIN ) 300 MG capsule Take 1-2 capsules (300-600 mg total) by mouth at bedtime. Only take as prescribed.  Husband is to administer medication Patient taking differently: Take 600 mg by mouth at bedtime. 02/14/20   Lucilla Lynwood BRAVO, MD  lisinopril  (PRINIVIL ,ZESTRIL ) 2.5 MG tablet Take 1 tablet (2.5 mg total) by mouth daily. 05/10/17   Arloa Suzen RAMAN, NP  Multiple Vitamin (MULTIVITAMIN WITH MINERALS) TABS tablet Take 1 tablet by mouth every morning.    [provider]  NOVOLOG  MIX 70/30 FLEXPEN (70-30) 100 UNIT/ML FlexPen Inject 44 Units into the skin 2 (two) times daily. 11/25/22   [provider]  Omega-3 Fatty Acids (FISH OIL PO) Take 1 capsule by mouth every morning.    [provider]  pravastatin  (PRAVACHOL ) 80 MG tablet Take 1 tablet (80 mg total) by mouth daily. Patient taking differently: Take 80 mg by mouth every morning. 05/11/17   Arloa Suzen RAMAN, NP  Semaglutide , 1 MG/DOSE, (OZEMPIC , 1 MG/DOSE,) 4 MG/3ML SOPN Inject 2 mg into the skin once  a week. Friday 02/12/21   [provider]  sertraline  (ZOLOFT ) 100 MG tablet Take 2 tablets (200 mg total) by mouth daily. For depression and anxiety. 06/13/13   Rochel Aureliano DASEN, PA-C  traZODone  (DESYREL ) 100 MG tablet Take 200 mg by mouth at bedtime. 01/21/21   [provider]                                                                                                                                    Past Surgical History Past Surgical History:  Procedure Laterality Date   CESAREAN SECTION  CHOLECYSTECTOMY  2009   wisdom teeth extraction     Family History Family History  Problem Relation Age of Onset   Diabetes insipidus Father    Arthritis Father    Heart failure Father    COPD Father    Emphysema Father    Asthma Son     Social History Social History   Tobacco Use   Smoking status: Every Day    Current packs/day: 0.50    Average packs/day: 0.5 packs/day for 23.0 years (11.5 ttl pk-yrs)    Types: Cigarettes   Smokeless tobacco: Never  Vaping Use   Vaping status: Every Day   Substances: Nicotine , Flavoring  Substance Use Topics   Alcohol use: Not Currently    Comment: unsure amount (1/6) - states generally does not drink   Drug use: No   Allergies Orange oil, Penicillins, Carisoprodol, and Piroxicam  Review of Systems Review of Systems  Constitutional:  Negative for fever.  Respiratory:  Negative for chest tightness and shortness of breath.   Cardiovascular:  Positive for leg swelling.  Gastrointestinal:  Negative for abdominal pain and nausea.  Genitourinary:  Negative for dysuria.  Neurological:  Negative for light-headedness.  All other systems reviewed and are negative.   Physical Exam Vital Signs  I have reviewed the triage vital signs BP 112/63   Pulse 76   Temp 98 F (36.7 C)   Resp 18   Wt 102.2 kg   LMP 11/04/2023 (Approximate)   SpO2 95%   BMI 36.35 kg/m  Physical Exam Vitals and nursing note reviewed.   Constitutional:      General: She is not in acute distress.    Appearance: Normal appearance.  HENT:     Head: Normocephalic and atraumatic.     Right Ear: External ear normal.     Left Ear: External ear normal.     Nose: Nose normal.     Mouth/Throat:     Mouth: Mucous membranes are moist.  Eyes:     General: No scleral icterus.       Right eye: No discharge.        Left eye: No discharge.  Cardiovascular:     Rate and Rhythm: Normal rate and regular rhythm.     Pulses: Normal pulses.     Heart sounds: Normal heart sounds.  Pulmonary:     Effort: Pulmonary effort is normal. No respiratory distress.     Breath sounds: Normal breath sounds. No stridor.  Abdominal:     General: Abdomen is flat. There is no distension.     Palpations: Abdomen is soft.     Tenderness: There is no abdominal tenderness.  Musculoskeletal:     Cervical back: No rigidity.     Right lower leg: Edema present.     Left lower leg: Edema present.  Skin:    General: Skin is warm and dry.     Capillary Refill: Capillary refill takes less than 2 seconds.  Neurological:     Mental Status: She is alert.  Psychiatric:        Mood and Affect: Mood normal.        Behavior: Behavior normal. Behavior is cooperative.     ED Results and Treatments Labs (all labs ordered are listed, but only abnormal results are displayed) Labs Reviewed  CBC - Abnormal; Notable for the following components:      Result Value   Platelets 408 (*)    All other components within normal limits  BASIC METABOLIC PANEL - Abnormal; Notable for the following components:   Glucose, Bld 183 (*)    All other components within normal limits  BRAIN NATRIURETIC PEPTIDE  TSH                                                                                                                          Radiology No results found.  Pertinent labs & imaging results that were available during my care of the patient were reviewed by me and  considered in my medical decision making (see MDM for details).  Medications Ordered in ED Medications  ketorolac  (TORADOL ) 15 MG/ML injection 15 mg (15 mg Intravenous Given 11/15/23 0647)                                                                                                                                     Procedures Procedures  (including critical care time)  Medical Decision Making / ED Course    Medical Decision Making:    [name] is a 43 y.o. female with past medical history as below, significant for eczema, anxiety, back pain, chronic pain syndrome, chronic leg swelling, OCD, polysubstance abuse who presents to the ED with complaint of leg swelling. The complaint involves an extensive differential diagnosis and also carries with it a high risk of complications and morbidity.  Serious etiology was considered. Ddx includes but is not limited to: CHF, nephrotic syndrome, electrolyte derangement, thyroid  derangement, etc  Complete initial physical exam performed, notably the patient was in no distress.    Reviewed and confirmed nursing documentation for past medical history, family history, social history.  Vital signs reviewed.    Clinical Course as of 11/16/23 0739  Tue Nov 15, 2023  0725 SpO2(!): 88 % She has Hx OSA, did desat briefly when she fell asleep but promptly returned to normal when awakened.  [SG]    Clinical Course User Index [SG] Elnor Jayson LABOR, DO    Brief summary: 43 yo female here w/ complaint as above.  Acute on chronic leg swelling.  She has no rest or complaints.  She did desaturate briefly while falling asleep, she has history of OSA does not use CPAP.  Her labs are stable.  X-ray is stable.  Will start patient on low-dose of Lasix  for the next week have her follow-up with her PCP for recheck.  Use compression  stockings, keep legs elevated.  Limit salt intake.  The patient improved significantly and was discharged in stable  condition. Detailed discussions were had with the patient/guardian regarding current findings, and need for close f/u with PCP or on call doctor. The patient/guardian has been instructed to return immediately if the symptoms worsen in any way for re-evaluation. Patient/guardian verbalized understanding and is in agreement with current care plan. All questions answered prior to discharge.                  Additional history obtained: -Additional history obtained from na -External records from outside source obtained and reviewed including: Chart review including previous notes, labs, imaging, consultation notes including  Prior ed visits, prior labs/imaging, home meds   Lab Tests: -I ordered, reviewed, and interpreted labs.   The pertinent results include:   Labs Reviewed  CBC - Abnormal; Notable for the following components:      Result Value   Platelets 408 (*)    All other components within normal limits  BASIC METABOLIC PANEL - Abnormal; Notable for the following components:   Glucose, Bld 183 (*)    All other components within normal limits  BRAIN NATRIURETIC PEPTIDE  TSH    Notable for labs stable   EKG   EKG Interpretation Date/Time:    Ventricular Rate:    PR Interval:    QRS Duration:    QT Interval:    QTC Calculation:   R Axis:      Text Interpretation:           Imaging Studies ordered: I ordered imaging studies including cxr I independently visualized the following imaging with scope of interpretation limited to determining acute life threatening conditions related to emergency care; findings noted above I independently visualized and interpreted imaging. I agree with the radiologist interpretation   Medicines ordered and prescription drug management: Meds ordered this encounter  Medications   ketorolac  (TORADOL ) 15 MG/ML injection 15 mg   DISCONTD: furosemide  (LASIX ) 20 MG tablet    Sig: Take 1 tablet (20 mg total) by mouth daily for 7  days.    Dispense:  7 tablet    Refill:  0   furosemide  (LASIX ) 20 MG tablet    Sig: Take 1 tablet (20 mg total) by mouth daily for 7 days.    Dispense:  7 tablet    Refill:  0    -I have reviewed the patients home medicines and have made adjustments as needed   Consultations Obtained: na   Cardiac Monitoring: Continuous pulse oximetry interpreted by myself, 99% on RA.    Social Determinants of Health:  Diagnosis or treatment significantly limited by social determinants of health: current smoker Counseled patient for approximately 3 minutes regarding smoking cessation. Discussed risks of smoking and how they applied and affected their visit here today. Patient not ready to quit at this time, however will follow up with their primary doctor when they are.   CPT code: 00593: intermediate counseling for smoking cessation     Reevaluation: After the interventions noted above, I reevaluated the patient and found that they have improved  Co morbidities that complicate the patient evaluation  Past Medical History:  Diagnosis Date   Anxiety    Asthma    Back pain    CAP (community acquired pneumonia)    Chronic pain syndrome    Depression    Diabetes mellitus without complication (HCC)    Gallbladder problem    Obsessive compulsive  disorder       Dispostion: Disposition decision including need for hospitalization was considered, and patient discharged from emergency department.    Final Clinical Impression(s) / ED Diagnoses Final diagnoses:  Leg swelling        Elnor Jayson LABOR, DO 11/16/23 0740

## 2023-11-15 NOTE — ED Notes (Signed)
Call to lab to add additional orders to blood sent, will need gold top

## 2023-11-18 ENCOUNTER — Other Ambulatory Visit: Payer: Self-pay

## 2023-11-18 DIAGNOSIS — J45909 Unspecified asthma, uncomplicated: Secondary | ICD-10-CM | POA: Insufficient documentation

## 2023-11-18 DIAGNOSIS — Z634 Disappearance and death of family member: Secondary | ICD-10-CM | POA: Diagnosis not present

## 2023-11-18 DIAGNOSIS — F419 Anxiety disorder, unspecified: Secondary | ICD-10-CM | POA: Diagnosis present

## 2023-11-18 DIAGNOSIS — E119 Type 2 diabetes mellitus without complications: Secondary | ICD-10-CM | POA: Insufficient documentation

## 2023-11-18 DIAGNOSIS — F17210 Nicotine dependence, cigarettes, uncomplicated: Secondary | ICD-10-CM | POA: Diagnosis not present

## 2023-11-18 NOTE — ED Triage Notes (Signed)
 Pt POV reporting increased anxiety and depression, husband passed yesterday, unable to eat or sleep. Denies SI/HI

## 2023-11-19 ENCOUNTER — Emergency Department (HOSPITAL_BASED_OUTPATIENT_CLINIC_OR_DEPARTMENT_OTHER)
Admission: EM | Admit: 2023-11-19 | Discharge: 2023-11-19 | Disposition: A | Discharge status: Home / Self Care | Payer: Managed Care, Other (non HMO) | Attending: Emergency Medicine | Admitting: Emergency Medicine

## 2023-11-19 DIAGNOSIS — Z634 Disappearance and death of family member: Secondary | ICD-10-CM

## 2023-11-19 MED ORDER — HYDROXYZINE PAMOATE 100 MG PO CAPS: 100.0000 mg | ORAL_CAPSULE | Freq: Three times a day (TID) | ORAL | 0 refills | Status: AC | PRN

## 2023-11-19 NOTE — Discharge Instructions (Addendum)
 You were evaluated in the Emergency Department and after careful evaluation, we did not find any emergent condition requiring admission or further testing in the hospital.  Symptoms seem consistent with acute grief.  Can use the Atarax  medication as needed for stress or anxiety.  May be helpful for sleep in the short-term.  Would recommend follow-up with your primary care doctor and/or behavioral health.  Please return to the Emergency Department if you experience any worsening of your condition.   Thank you for allowing us  to be a part of your care.

## 2023-11-19 NOTE — ED Provider Notes (Signed)
 [facility]: [facility] Emergency Department Provider Note MRN:  [id number]  Arrival date & time: 11/19/23     Chief Complaint   Anxiety and Depression   History of Present Illness   [name] is a 43 y.o. year-old female with a history of diabetes presenting to the ED with chief complaint of anxiety and depression.  Patient explains that she and her husband both had a cold recently.  Husband did not seem to be getting better.  Thursday she found him in bed cold, unresponsive.  He had passed away.  This was a very unexpected death.  She has been having significant trouble sleeping, coping with the loss.  Denies SI or HI or AVH, having trouble sleeping.  Review of Systems  A thorough review of systems was obtained and all systems are negative except as noted in the HPI and PMH.   Patient's Health History    Past Medical History:  Diagnosis Date   Anxiety    Asthma    Back pain    CAP (community acquired pneumonia)    Chronic pain syndrome    Depression    Diabetes mellitus without complication (HCC)    Gallbladder problem    Obsessive compulsive disorder     Past Surgical History:  Procedure Laterality Date   CESAREAN SECTION     CHOLECYSTECTOMY  2009   wisdom teeth extraction      Family History  Problem Relation Age of Onset   Diabetes insipidus Father    Arthritis Father    Heart failure Father    COPD Father    Emphysema Father    Asthma Son     Social History   Socioeconomic History   Marital status: Married    Spouse name: Not on file   Number of children: Not on file   Years of education: Not on file   Highest education level: Not on file  Occupational History   Not on file  Tobacco Use   Smoking status: Every Day    Current packs/day: 0.50    Average packs/day: 0.5 packs/day for 23.0 years (11.5 ttl pk-yrs)    Types: Cigarettes   Smokeless tobacco: Never  Vaping Use   Vaping status: Every Day   Substances: Nicotine ,  Flavoring  Substance and Sexual Activity   Alcohol use: Not Currently    Comment: unsure amount (1/6) - states generally does not drink   Drug use: No   Sexual activity: Yes    Birth control/protection: None  Other Topics Concern   Not on file  Social History Narrative   Not on file   Social Drivers of Health   Financial Resource Strain: Low Risk  (11/10/2023)   Received from [facility]   Overall Financial Resource Strain (CARDIA)    Difficulty of Paying Living Expenses: Not hard at all  Food Insecurity: No Food Insecurity (11/10/2023)   Received from [facility]   Hunger Vital Sign    Worried About Running Out of Food in the Last Year: Never true    Ran Out of Food in the Last Year: Never true  Transportation Needs: No Transportation Needs (11/10/2023)   Received from [facility] - Transportation    Lack of Transportation (Medical): No    Lack of Transportation (Non-Medical): No  Physical Activity: Unknown (11/10/2023)   Received from [facility]   Exercise Vital Sign    Days of Exercise per Week: 0 days  Minutes of Exercise per Session: Not on file  Stress: Stress Concern Present (11/10/2023)   Received from [facility] - Occupational Stress Questionnaire    Feeling of Stress : Rather much  Social Connections: Socially Isolated (11/10/2023)   Received from [facility]   Social Network    How would you rate your social network (family, work, friends)?: Little participation, lonely and socially isolated  Intimate Partner Violence: Not At Risk (11/10/2023)   Received from [facility]   HITS    Over the last 12 months how often did your partner physically hurt you?: Never    Over the last 12 months how often did your partner insult you or talk down to you?: Never    Over the last 12 months how often did your partner threaten you with physical harm?: Never    Over the last 12 months how often did your partner  scream or curse at you?: Never     Physical Exam   Vitals:   11/19/23 0004 11/19/23 0025  BP: (!) 102/56 106/66  Pulse: 85   Resp: 18   Temp:    SpO2: 95%     CONSTITUTIONAL: Well-appearing, NAD, tearful NEURO/PSYCH:  Alert and oriented x 3, no focal deficits EYES:  eyes equal and reactive ENT/NECK:  no LAD, no JVD CARDIO: Regular rate, well-perfused, normal S1 and S2 PULM:  CTAB no wheezing or rhonchi GI/GU:  non-distended, non-tender MSK/SPINE:  No gross deformities, no edema SKIN:  no rash, atraumatic   *Additional and/or pertinent findings included in MDM below  Diagnostic and Interventional Summary    EKG Interpretation Date/Time:    Ventricular Rate:    PR Interval:    QRS Duration:    QT Interval:    QTC Calculation:   R Axis:      Text Interpretation:         Labs Reviewed - No data to display  No orders to display    Medications - No data to display   Procedures  /  Critical Care Procedures  ED Course and Medical Decision Making  Initial Impression and Ddx Acute grief or bereavement without signs of psychiatric or medical crisis.  Past medical/surgical history that increases complexity of ED encounter: Documented history of substance use issues  Interpretation of Diagnostics Laboratory and/or imaging options to aid in the diagnosis/care of the patient were considered.  After careful history and physical examination, it was determined that there was no indication for diagnostics at this time.  Patient Reassessment and Ultimate Disposition/Management     No emergent process, patient appropriate for discharge.  Patient management required discussion with the following services or consulting groups:  None  Complexity of Problems Addressed Acute illness or injury that poses threat of life of bodily function  Additional Data Reviewed and Analyzed Further history obtained from: Further history from spouse/family member  Additional Factors  Impacting ED Encounter Risk Prescriptions  Ozell HERO. Theadore, MD [facility] [email or web address]  Final Clinical Impressions(s) / ED Diagnoses     ICD-10-CM   1. Recent bereavement  Z63.4       ED Discharge Orders          Ordered    hydrOXYzine  (VISTARIL ) 100 MG capsule  3 times daily PRN        11/19/23 0113             Discharge Instructions Discussed with and  Provided to Patient:    Discharge Instructions      You were evaluated in the Emergency Department and after careful evaluation, we did not find any emergent condition requiring admission or further testing in the hospital.  Symptoms seem consistent with acute grief.  Can use the Atarax  medication as needed for stress or anxiety.  May be helpful for sleep in the short-term.  Would recommend follow-up with your primary care doctor and/or behavioral health.  Please return to the Emergency Department if you experience any worsening of your condition.   Thank you for allowing us  to be a part of your care.      Theadore Ozell HERO, MD 11/19/23 [id number]

## 2023-12-09 ENCOUNTER — Telehealth: Payer: Self-pay

## 2023-12-09 NOTE — Telephone Encounter (Signed)
 Patient asking to be seen former patient of Dr.Nitka

## 2023-12-19 ENCOUNTER — Encounter: Payer: Self-pay | Admitting: Physical Medicine and Rehabilitation

## 2023-12-19 ENCOUNTER — Ambulatory Visit: Payer: Managed Care, Other (non HMO) | Admitting: Physical Medicine and Rehabilitation

## 2023-12-19 VITALS — BP 108/75 | HR 105

## 2023-12-19 DIAGNOSIS — G894 Chronic pain syndrome: Secondary | ICD-10-CM | POA: Diagnosis not present

## 2023-12-19 DIAGNOSIS — M5442 Lumbago with sciatica, left side: Secondary | ICD-10-CM | POA: Diagnosis not present

## 2023-12-19 DIAGNOSIS — M5441 Lumbago with sciatica, right side: Secondary | ICD-10-CM | POA: Diagnosis not present

## 2023-12-19 DIAGNOSIS — M7918 Myalgia, other site: Secondary | ICD-10-CM | POA: Diagnosis not present

## 2023-12-19 DIAGNOSIS — G8929 Other chronic pain: Secondary | ICD-10-CM

## 2023-12-19 DIAGNOSIS — M5116 Intervertebral disc disorders with radiculopathy, lumbar region: Secondary | ICD-10-CM

## 2023-12-19 DIAGNOSIS — M5416 Radiculopathy, lumbar region: Secondary | ICD-10-CM

## 2023-12-19 NOTE — Progress Notes (Unsigned)
 [name] - 43 y.o. female MRN [id number]  Date of birth: [birth year]  Office Visit Note: Visit Date: 12/19/2023 PCP: April Manson, NP Referred by: April Manson, NP  Subjective: Chief Complaint  Patient presents with  . Lower Back - Pain   HPI: [name] is a 43 y.o. female who comes in today as a self referral for evaluation of chronic, worsening and severe bilateral lower back pain, intermittent pain radiating down anterior thighs to knees. Patient was long time patient of Dr. Vira Browns, she was last seen by [place] in 2022. Pain ongoing for several years, her pain is constant, no specific aggravating factors. She describes her pain as sore and aching, currently rates as 10 out of 10. States she feels like her spine is being ripped out of her body. Some relief of pain with home exercise regimen, rest and use of medications. She does take Gabapentin daily. History of formal physical therapy and myofascial trigger point injections with minimal relief of pain. History of chronic pain management with Merryl Hacker, NP at [facility], felt chronic pain management was not beneficial in alleviating her pain. Lumbar MRI imaging from 2023 shows right subarticular disc protrusion at L5-S1, contacting and displacing the descending right S1 nerve root. There is central to left subarticular disc protrusion at L4-5 with resultant moderate left worse than right lateral recess stenosis. Also reactive marrow edema about the right T12-L1 and L4-L5 facets due to facet arthritis. Dr. Otelia Sergeant did recommend surgery several years ago, unfortunately her insurance would not approve surgical intervention. She was also evaluated by Dr. Tressie Stalker, per patient he also recommended surgical intervention, she ultimately chose not to proceed. We attempted to get approval for spinal cord stimulator placement in 2022, her insurance also denied this request due to lack of prior  surgery. No history of lumbar injections in our office. She is here today to discuss plan moving forward, is also requesting a letter form our office stating that she is unable to work. She is tearful today due to recent unexpected loss of her husband. Patient denies focal weakness, numbness and tingling. No recent trauma or falls.   Patient's course is complicated by diabetes mellitus, neuropathy, bipolar depression with multiple psychotropic medications and alcohol abuse.        Review of Systems  Musculoskeletal:  Positive for back pain and myalgias.  Neurological:  Negative for tingling, sensory change, focal weakness and weakness.  Psychiatric/Behavioral:  Positive for depression.        Patient is tearful during our visit today, recent unexpected loss of her husband in February.  All other systems reviewed and are negative.  Otherwise per HPI.  Assessment & Plan: Visit Diagnoses:    ICD-10-CM   1. Chronic bilateral low back pain with bilateral sciatica  M54.42 Ambulatory referral to Pain Clinic   M54.41    G89.29     2. Lumbar radiculopathy  M54.16 Ambulatory referral to Pain Clinic    3. Intervertebral disc disorders with radiculopathy, lumbar region  M51.16 Ambulatory referral to Pain Clinic    4. Myofascial pain syndrome  M79.18 Ambulatory referral to Pain Clinic    5. Chronic pain syndrome  G89.4 Ambulatory referral to Pain Clinic       Plan: Findings:  Chronic, worsening and severe bilateral lower back pain, intermittent radiation to bilateral anterior thighs with history of chronic pain syndrome. Patient continues to have severe pain despite good conservative therapies  such as formal physical therapy, home exercise regimen, rest and use of medications. Patients clinical presentation and exam are complex, differentials include lumbar radiculopathy, facet mediated pain and myofascial pain syndrome. Her lower back seems to be biggest generator of pain. Severe myofascial  tenderness noted to bilateral lumbar paraspinal regions upon palpation today. We discussed treatment plan in detail. She is requesting referral to [facility], does not wish to return to [facility]. I will go ahead and place referral today. She may benefit from chronic pain management and interventional procedures with Dr. Manon Hilding. I encouraged patient to speak with her primary care provider regarding grief counseling and continued management of her medications. I do feel she would benefit from re-grouping with physical therapy at some point. Patient requesting letter to give to her insurance company. I did provide patient with letter detailing her chronic lumbar spine issues, including lumbar MRI report from 2023. Based on lumbar MRI from 2023 and her exam today there is no structural components that would prevent her from working, however patient feels her spine condition would make it difficult to commit to full time employment. Patient has no questions at this time. No red flag symptoms noted upon exam today.     Meds & Orders: No orders of the defined types were placed in this encounter.   Orders Placed This Encounter  Procedures  . Ambulatory referral to Pain Clinic    Follow-up: Return if symptoms worsen or fail to improve.   Procedures: No procedures performed      Clinical History: CLINICAL DATA:  Initial evaluation for chronic lower back pain with bilateral leg pain.   EXAM: MRI LUMBAR SPINE WITHOUT CONTRAST   TECHNIQUE: Multiplanar, multisequence MR imaging of the lumbar spine was performed. No intravenous contrast was administered.   COMPARISON:  Previous MRI from 09/28/2020.   FINDINGS: Segmentation: Standard. Lowest well-formed disc space labeled the L5-S1 level.   Alignment:  Mild levoscoliosis.  No significant listhesis.   Vertebrae: Vertebral body height maintained without acute or chronic fracture. Bone marrow signal intensity diffusely decreased  on T1 weighted sequence, nonspecific, but most commonly related to anemia, smoking or obesity. No discrete or worrisome osseous lesions. Mild reactive endplate changes present about the L3-4 through L5-S1 interspaces. Associated mild reactive marrow edema about the right aspect of the L4-5 interspace. Additionally, there is abnormal marrow edema involving the right transverse process of L1 (series 3, image 5), likely due to facet arthritis about the adjacent right T12-L1 facet. No other abnormal marrow edema.   Conus medullaris and cauda equina: Conus extends to the L1 level. Conus and cauda equina appear normal.   Paraspinal and other soft tissues: Unremarkable.   Disc levels:   T12-L1: Normal interspace. Right greater than left facet hypertrophy with associated mild reactive marrow edema on the right. No spinal stenosis. Foramina remain patent.   L1-2: Normal interspace. Mild right greater than left facet hypertrophy. No canal or foraminal stenosis.   L2-3: Normal interspace. Mild-to-moderate left greater than right facet hypertrophy. No spinal stenosis. Foramina remain patent.   L3-4: Circumferential disc bulge with disc desiccation. Mild reactive endplate spurring. Superimposed small central disc protrusion indents the ventral thecal sac. Mild facet hypertrophy. Resultant mild bilateral subarticular stenosis with mild bilateral L3 foraminal narrowing. Appearance is mildly progressed.   L4-5: Degenerative intervertebral disc space narrowing with diffuse disc bulge and reactive endplate spurring. Superimpose broad based central to left subarticular disc protrusion indents the ventral thecal sac (series 5, image  27). Mild to moderate bilateral facet hypertrophy. Resultant moderate left worse than right subarticular stenosis, with mild bilateral L4 foraminal narrowing. Changes have progressed from prior.   L5-S1: Disc desiccation with intervertebral disc space  narrowing. Right-sided reactive endplate spurring. Right subarticular disc protrusion contacts and displaces the descending right S1 nerve root as it courses through the right lateral recess (series 5, image 32). Mild facet spurring. Resultant mild right lateral moderate right lateral recess stenosis. Central canal remains patent. Mild right L5 foraminal narrowing. Appearance has progressed.   IMPRESSION: 1. Right subarticular disc protrusion at L5-S1, contacting and displacing the descending right S1 nerve root as it courses through the right lateral recess. This has progressed from previous. 2. Broad central to left subarticular disc protrusion at L4-5 with resultant moderate left worse than right lateral recess stenosis, also progressed from prior. 3. Disc bulge with facet hypertrophy at L3-4 with resultant mild bilateral subarticular stenosis, with mild bilateral L3 foraminal narrowing. 4. Reactive marrow edema about the right T12-L1 and L4-5 facets due to facet arthritis. Findings could contribute to lower back pain.     Electronically Signed   By: Rise Mu M.D.   On: 07/19/2022 05:03   She reports that she has been smoking cigarettes. She has a 11.5 pack-year smoking history. She has never used smokeless tobacco.  Recent Labs    07/06/23 1623  HGBA1C 10.1*    Objective:  VS:  HT:    WT:   BMI:     BP:108/75  HR:(!) 105bpm  TEMP: ( )  RESP:  Physical Exam Vitals and nursing note reviewed.  HENT:     Head: Normocephalic and atraumatic.     Right Ear: External ear normal.     Left Ear: External ear normal.     Nose: Nose normal.     Mouth/Throat:     Mouth: Mucous membranes are moist.  Eyes:     Extraocular Movements: Extraocular movements intact.  Cardiovascular:     Rate and Rhythm: Normal rate.     Pulses: Normal pulses.  Pulmonary:     Effort: Pulmonary effort is normal.  Abdominal:     General: Abdomen is flat. There is no distension.   Musculoskeletal:        General: Tenderness present.     Cervical back: Normal range of motion.     Comments: Patient rises from seated position to standing without difficulty. Good lumbar range of motion. No pain noted with facet loading. 5/5 strength noted with bilateral hip flexion, knee flexion/extension, ankle dorsiflexion/plantarflexion and EHL. No clonus noted bilaterally. No pain upon palpation of greater trochanters. No pain with internal/external rotation of bilateral hips. Sensation intact bilaterally. Myofascial tenderness noted to bilateral lumbar paraspinal regions. Negative slump test bilaterally. Ambulates without aid, gait steady.     Skin:    General: Skin is warm and dry.     Capillary Refill: Capillary refill takes less than 2 seconds.  Neurological:     General: No focal deficit present.     Mental Status: She is alert and oriented to person, place, and time.  Psychiatric:        Mood and Affect: Mood normal.        Behavior: Behavior normal.    Ortho Exam  Imaging: No results found.  Past Medical/Family/Surgical/Social History: Medications & Allergies reviewed per EMR, new medications updated. Patient Active Problem List   Diagnosis Date Noted  . Polysubstance abuse (HCC) 07/06/2023  . Acute asthma  exacerbation 12/01/2022  . Essential hypertension 11/30/2022  . Anxiety and depression 11/30/2022  . Diabetic neuropathy (HCC) 11/30/2022  . Acute bronchitis 08/13/2022  . Acute severe exacerbation of moderate persistent asthma 08/12/2022  . Moderate persistent asthma 12/02/2021  . Diaphoresis 12/02/2021  . Altered mental status 12/02/2021  . Respiratory failure with hypoxia (HCC) 03/09/2021  . Diabetes mellitus without complication (HCC)   . Type 2 diabetes mellitus with hyperglycemia (HCC) 09/22/2020  . Protrusion of lumbar intervertebral disc 03/31/2020  . Obesity (BMI 30.0-34.9) 04/11/2019  . Neuropathy 04/11/2019  . Dyslipidemia 04/11/2019  . 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 Uncontrolled type 2 diabetes mellitus with hyperglycemia, with long-term current use of insulin (HCC) 04/11/2019  . Overdose of antipsychotic 01/23/2017  . Hypoxia 01/17/2017  . 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 Bipolar I disorder (HCC) 06/11/2013  . Depression with suicidal ideation 06/09/2013  . Alcohol abuse, episodic drinking behavior 06/09/2013  . Severe episode of recurrent major depressive disorder, with psychotic features (HCC) 06/09/2013  . 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 SNORING 07/18/2009  . Ovarian cyst 06/13/2009  . HYPERCHOLESTEROLEMIA 09/19/2008  . Back pain 09/18/2008  . Tobacco use 08/13/2008  . Insomnia 08/13/2008   Past Medical History:  Diagnosis Date  . Anxiety   . Asthma   . Back pain   . CAP (community acquired pneumonia)   . Chronic pain syndrome   . Depression   . Diabetes mellitus without complication (HCC)   . Gallbladder problem   . Obsessive compulsive disorder    Family History  Problem Relation Age of Onset  . Diabetes insipidus Father   . Arthritis Father   . Heart failure Father   . COPD Father   . Emphysema Father   . Asthma Son    Past Surgical History:  Procedure Laterality Date  . CESAREAN SECTION    . CHOLECYSTECTOMY  2009  . wisdom teeth extraction     Social History   Occupational History  . Not on file  Tobacco Use  . Smoking status: Every Day    Current packs/day: 0.50    Average packs/day: 0.5 packs/day for 23.0 years (11.5 ttl pk-yrs)    Types: Cigarettes  . Smokeless tobacco: Never  Vaping Use  . Vaping status: Every Day  . Substances: Nicotine, Flavoring  Substance and Sexual Activity  . Alcohol use: Not Currently    Comment: unsure amount (1/6) - states generally does not drink  . Drug use: No  . Sexual activity: Yes    Birth control/protection: None

## 2023-12-19 NOTE — Progress Notes (Unsigned)
 Core Outcome Measures Index (COMI) Back Score  Average Pain 10  COMI Score 100 %

## 2024-04-19 ENCOUNTER — Emergency Department (HOSPITAL_BASED_OUTPATIENT_CLINIC_OR_DEPARTMENT_OTHER)
Admission: EM | Admit: 2024-04-19 | Discharge: 2024-04-19 | Disposition: A | Discharge status: Home / Self Care | Attending: Emergency Medicine | Admitting: Emergency Medicine

## 2024-04-19 ENCOUNTER — Other Ambulatory Visit: Payer: Self-pay

## 2024-04-19 ENCOUNTER — Emergency Department (HOSPITAL_BASED_OUTPATIENT_CLINIC_OR_DEPARTMENT_OTHER)

## 2024-04-19 DIAGNOSIS — S161XXA Strain of muscle, fascia and tendon at neck level, initial encounter: Secondary | ICD-10-CM | POA: Diagnosis not present

## 2024-04-19 DIAGNOSIS — Z79899 Other long term (current) drug therapy: Secondary | ICD-10-CM | POA: Diagnosis not present

## 2024-04-19 DIAGNOSIS — W19XXXA Unspecified fall, initial encounter: Secondary | ICD-10-CM

## 2024-04-19 DIAGNOSIS — S199XXA Unspecified injury of neck, initial encounter: Secondary | ICD-10-CM | POA: Diagnosis present

## 2024-04-19 DIAGNOSIS — S0990XA Unspecified injury of head, initial encounter: Secondary | ICD-10-CM | POA: Insufficient documentation

## 2024-04-19 DIAGNOSIS — S39012A Strain of muscle, fascia and tendon of lower back, initial encounter: Secondary | ICD-10-CM | POA: Diagnosis not present

## 2024-04-19 DIAGNOSIS — W01198A Fall on same level from slipping, tripping and stumbling with subsequent striking against other object, initial encounter: Secondary | ICD-10-CM | POA: Insufficient documentation

## 2024-04-19 DIAGNOSIS — F17200 Nicotine dependence, unspecified, uncomplicated: Secondary | ICD-10-CM | POA: Diagnosis not present

## 2024-04-19 DIAGNOSIS — Y92019 Unspecified place in single-family (private) house as the place of occurrence of the external cause: Secondary | ICD-10-CM | POA: Diagnosis not present

## 2024-04-19 DIAGNOSIS — Z7984 Long term (current) use of oral hypoglycemic drugs: Secondary | ICD-10-CM | POA: Diagnosis not present

## 2024-04-19 DIAGNOSIS — Z7982 Long term (current) use of aspirin: Secondary | ICD-10-CM | POA: Diagnosis not present

## 2024-04-19 DIAGNOSIS — E119 Type 2 diabetes mellitus without complications: Secondary | ICD-10-CM | POA: Diagnosis not present

## 2024-04-19 LAB — COMPREHENSIVE METABOLIC PANEL WITH GFR
ALT: 16 U/L (ref 0–44)
AST: 20 U/L (ref 15–41)
Albumin: 4.3 g/dL (ref 3.5–5.0)
Alkaline Phosphatase: 70 U/L (ref 38–126)
Anion gap: 10 (ref 5–15)
BUN: 7 mg/dL (ref 6–20)
CO2: 28 mmol/L (ref 22–32)
Calcium: 9.6 mg/dL (ref 8.9–10.3)
Chloride: 98 mmol/L (ref 98–111)
Creatinine, Ser: 0.65 mg/dL (ref 0.44–1.00)
GFR, Estimated: >60 mL/min (ref >60)
Glucose, Bld: 221 mg/dL — ABNORMAL HIGH (ref 70–99)
Potassium: 4.3 mmol/L (ref 3.5–5.1)
Sodium: 136 mmol/L (ref 135–145)
Total Bilirubin: <0.2 mg/dL (ref 0.0–1.2)
Total Protein: 6.9 g/dL (ref 6.5–8.1)

## 2024-04-19 LAB — CBC
HCT: 43 % (ref 36.0–46.0)
Hemoglobin: 14.7 g/dL (ref 12.0–15.0)
MCH: 29.9 pg (ref 26.0–34.0)
MCHC: 34.2 g/dL (ref 30.0–36.0)
MCV: 87.6 fL (ref 80.0–100.0)
Platelets: 341 K/uL (ref 150–400)
RBC: 4.91 MIL/uL (ref 3.87–5.11)
RDW: 13.7 % (ref 11.5–15.5)
WBC: 10.2 K/uL (ref 4.0–10.5)
nRBC: 0 % (ref 0.0–0.2)

## 2024-04-19 LAB — PREGNANCY, URINE: Preg Test, Ur: NEGATIVE

## 2024-04-19 LAB — HCG, SERUM, QUALITATIVE: Preg, Serum: NEGATIVE

## 2024-04-19 MED ORDER — HYDROCODONE-ACETAMINOPHEN 5-325 MG PO TABS
1.0000 | ORAL_TABLET | Freq: Once | ORAL | Status: AC
  Administered 2024-04-19: 1 via ORAL
  Filled 2024-04-19: qty 1

## 2024-04-19 MED ORDER — HYDROCODONE-ACETAMINOPHEN 5-325 MG PO TABS: 1.0000 | ORAL_TABLET | Freq: Four times a day (QID) | ORAL | 0 refills | Status: DC | PRN

## 2024-04-19 MED ORDER — ONDANSETRON 4 MG PO TBDP
4.0000 mg | ORAL_TABLET | Freq: Once | ORAL | Status: AC
  Administered 2024-04-19: 4 mg via ORAL
  Filled 2024-04-19: qty 1

## 2024-04-19 NOTE — Discharge Instructions (Addendum)
 If headache or any type dizziness or nausea persist that could be consistent with a concussion.  Then follow-up with your doctor.  Take the hydrocodone  as needed for pain.  Workup today without any acute findings CT head neck and lumbar spine without any acute abnormalities.  But you have a lot of degenerative findings in the lower part of the back that you know of.

## 2024-04-19 NOTE — ED Triage Notes (Signed)
 Pt POV after mechanical fall at home, slipped backwards on hardwood floor, possible LOC, hit back of head on floor. Also reports back pain, hx degenerative disc disease.

## 2024-04-19 NOTE — ED Provider Notes (Addendum)
 [place] EMERGENCY DEPARTMENT AT [facility] Provider Note   CSN: [id number] Arrival date & time: 04/19/24  1839     Patient presents with: Fall   [name] is a 43 y.o. female.   Patient with mechanical fall at home.  Patient slipped and fell backwards.  Striking the back of her head complained of neck pain back of the head pain radiates around to the front.  And has a history of degenerative disc disease and low back problems.  Made worse by the fall.  Denies any hip pain.  Denies any injury upper extremity lower extremity otherwise.  Also no numbness or weakness to the lower extremity or feet.  Past medical history significant for gallbladder problems acid compulsive disorder anxiety depression chronic pain syndrome community-acquired pneumonia diabetes.  Patient had her gallbladder removed.  Patient is an everyday smoker.       Prior to Admission medications   Medication Sig Start Date End Date Taking? Authorizing Provider  acetaminophen  (TYLENOL ) 500 MG tablet Take 1 tablet (500 mg total) by mouth every 6 (six) hours as needed. Patient taking differently: Take 500 mg by mouth every 6 (six) hours as needed for moderate pain. 04/26/22   Redwine, Madison A, PA-C  albuterol  (PROVENTIL ) (2.5 MG/3ML) 0.083% nebulizer solution Take 2.5 mg by nebulization every 6 (six) hours as needed for wheezing or shortness of breath. 11/29/22 11/29/23  [provider]  albuterol  (VENTOLIN  HFA) 108 (90 Base) MCG/ACT inhaler Inhale 1-2 puffs into the lungs every 6 (six) hours as needed for wheezing or shortness of breath. 12/02/22   Jillian Buttery, MD  amphetamine -dextroamphetamine  (ADDERALL) 30 MG tablet Take 30 mg by mouth daily. 09/28/20   [provider]  ARIPiprazole  (ABILIFY ) 10 MG tablet Take 10 mg by mouth every morning. 01/21/21   [provider]  aspirin  EC 81 MG tablet Take 81 mg by mouth daily. Swallow whole.    [provider]  BD PEN  NEEDLE NANO 2ND GEN 32G X 4 MM MISC daily. 09/23/20   [provider]  busPIRone  (BUSPAR ) 15 MG tablet Take 15 mg by mouth 3 (three) times daily. 10/14/22   [provider]  Continuous Blood Gluc Sensor (FREESTYLE LIBRE 3 SENSOR) MISC 1 each every 14 (fourteen) days. 11/21/22   [provider]  Empagliflozin-metFORMIN  HCl (SYNJARDY) 12.02-999 MG TABS Take 1 tablet by mouth 2 (two) times daily. 02/12/21   [provider]  ezetimibe  (ZETIA ) 10 MG tablet Take 1 tablet (10 mg total) by mouth daily. 09/26/17   Arloa Suzen RAMAN, NP  Fluticasone  Furoate-Vilanterol (BREO ELLIPTA ) 50-25 MCG/ACT AEPB Inhale 1 puff into the lungs daily in the afternoon. Patient not taking: Reported on 12/01/2022 08/13/22   Rojelio Nest, DO  furosemide  (LASIX ) 20 MG tablet Take 1 tablet (20 mg total) by mouth daily for 7 days. 11/15/23 11/22/23  Pamella Ozell LABOR, DO  gabapentin  (NEURONTIN ) 100 MG capsule Take 1 capsule (100 mg total) by mouth daily after breakfast. Patient taking differently: Take 100 mg by mouth every morning. 02/14/20   Lucilla Lynwood BRAVO, MD  gabapentin  (NEURONTIN ) 300 MG capsule Take 1-2 capsules (300-600 mg total) by mouth at bedtime. Only take as prescribed.  Husband is to administer medication Patient taking differently: Take 600 mg by mouth at bedtime. 02/14/20   Nitka, James E, MD  hydrOXYzine  (VISTARIL ) 100 MG capsule Take 1 capsule (100 mg total) by mouth 3 (three) times daily as needed for itching. 11/19/23  Theadore Ozell HERO, MD  lisinopril  (PRINIVIL ,ZESTRIL ) 2.5 MG tablet Take 1 tablet (2.5 mg total) by mouth daily. 05/10/17   Arloa Suzen RAMAN, NP  Multiple Vitamin (MULTIVITAMIN WITH MINERALS) TABS tablet Take 1 tablet by mouth every morning.    [provider]  NOVOLOG  MIX 70/30 FLEXPEN (70-30) 100 UNIT/ML FlexPen Inject 44 Units into the skin 2 (two) times daily. 11/25/22   [provider]  Omega-3 Fatty Acids (FISH OIL PO) Take 1 capsule by mouth every  morning.    [provider]  pravastatin  (PRAVACHOL ) 80 MG tablet Take 1 tablet (80 mg total) by mouth daily. Patient taking differently: Take 80 mg by mouth every morning. 05/11/17   Arloa Suzen RAMAN, NP  Semaglutide , 1 MG/DOSE, (OZEMPIC , 1 MG/DOSE,) 4 MG/3ML SOPN Inject 2 mg into the skin once a week. Friday 02/12/21   [provider]  sertraline  (ZOLOFT ) 100 MG tablet Take 2 tablets (200 mg total) by mouth daily. For depression and anxiety. 06/13/13   Rochel Aureliano DASEN, PA-C  traZODone  (DESYREL ) 100 MG tablet Take 200 mg by mouth at bedtime. 01/21/21   [provider]    Allergies: Orange oil, Penicillins, Carisoprodol, and Piroxicam    Review of Systems  Constitutional:  Negative for chills and fever.  HENT:  Negative for ear pain and sore throat.   Eyes:  Negative for pain and visual disturbance.  Respiratory:  Negative for cough and shortness of breath.   Cardiovascular:  Negative for chest pain and palpitations.  Gastrointestinal:  Negative for abdominal pain and vomiting.  Genitourinary:  Negative for dysuria and hematuria.  Musculoskeletal:  Positive for back pain and neck pain. Negative for arthralgias.  Skin:  Negative for color change and rash.  Neurological:  Positive for headaches. Negative for seizures, syncope, weakness and numbness.  All other systems reviewed and are negative.   Updated Vital Signs BP (!) 140/93 (BP Location: Right Arm)   Pulse 98   Temp 98.5 F (36.9 C)   Resp 18   Ht 1.676 m (5' 6)   Wt 94.3 kg   LMP 04/05/2024 (Approximate)   SpO2 97%   BMI 33.57 kg/m   Physical Exam Vitals and nursing note reviewed.  Constitutional:      General: She is not in acute distress.    Appearance: Normal appearance. She is well-developed.  HENT:     Head: Normocephalic.     Comments: Tenderness to palpation of the back of the head.  No evidence of any laceration or any bleeding.    Mouth/Throat:     Mouth: Mucous membranes are moist.   Eyes:     Extraocular Movements: Extraocular movements intact.     Conjunctiva/sclera: Conjunctivae normal.     Pupils: Pupils are equal, round, and reactive to light.  Neck:     Comments: Tenderness to palpation to the posterior spine. Cardiovascular:     Rate and Rhythm: Normal rate and regular rhythm.     Heart sounds: No murmur heard. Pulmonary:     Effort: Pulmonary effort is normal. No respiratory distress.     Breath sounds: Normal breath sounds.  Abdominal:     Palpations: Abdomen is soft.     Tenderness: There is no abdominal tenderness.  Musculoskeletal:        General: No swelling.     Cervical back: Neck supple. Tenderness present.     Comments: Tenderness to palpation to the lumbar spine.  No obvious deformity.  Skin:  General: Skin is warm and dry.     Capillary Refill: Capillary refill takes less than 2 seconds.  Neurological:     General: No focal deficit present.     Mental Status: She is alert and oriented to person, place, and time.     Cranial Nerves: No cranial nerve deficit.     Sensory: No sensory deficit.     Motor: No weakness.  Psychiatric:        Mood and Affect: Mood normal.     (all labs ordered are listed, but only abnormal results are displayed) Labs Reviewed  CBC  COMPREHENSIVE METABOLIC PANEL WITH GFR  PREGNANCY, URINE  HCG, SERUM, QUALITATIVE    EKG: EKG Interpretation Date/Time:  Thursday April 19 2024 18:59:44 EDT Ventricular Rate:  91 PR Interval:  154 QRS Duration:  70 QT Interval:  392 QTC Calculation: 482 R Axis:   84  Text Interpretation: Normal sinus rhythm Prolonged QT Abnormal ECG When compared with ECG of 16-Aug-2023 12:28, No significant change was found Confirmed by Zaiah Credeur [phone]) on 04/19/2024 7:22:00 PM  Radiology: No results found.   Procedures   Medications Ordered in the ED  ondansetron  (ZOFRAN -ODT) disintegrating tablet 4 mg (has no administration in time range)  HYDROcodone -acetaminophen   (NORCO/VICODIN) 5-325 MG per tablet 1 tablet (has no administration in time range)                                    Medical Decision Making Amount and/or Complexity of Data Reviewed Labs: ordered. Radiology: ordered.  Risk Prescription drug management.  I will CT head CT neck CT lumbar spine.  Will treat here with Zofran  and some oral pain medicine.  Pregnancy test negative complete metabolic panel other than glucose at 221 without any significant findings.  Renal function normal.  LFTs normal.  CBC white count 10.2 hemoglobin 14.7 platelets 341.  CT head without any acute findings.  CT cervical spine without any acute findings.  CT lumbar spine degenerative changes as above but no acute bony abnormality.  Patient has significant disc narrowing spurring and vacuum disks noted at L4-L5 L5-S1 mild to moderate degenerative facet disease from L2 to through S1.  Has a history of chronic pain.  And is on pain management meds.  CT head and neck without any acute findings.  As mentioned above.  Will give patient a short course of some pain medicine due to the acute injuries.  She can follow-up with her regular doctor.   Final diagnoses:  Fall, initial encounter  Injury of head, initial encounter  Strain of neck muscle, initial encounter  Strain of lumbar region, initial encounter    ED Discharge Orders     None          Geraldene Hamilton, MD 04/19/24 1931    Geraldene Hamilton, MD 04/19/24 1932    Geraldene Hamilton, MD 04/19/24 [id number]    Geraldene Hamilton, MD 04/19/24 2048

## 2024-04-19 NOTE — ED Notes (Signed)
 Reviewed AVS/discharge instruction with patient. Time allotted for and all questions answered. Patient is agreeable for d/c and escorted to ed exit by staff.

## 2024-05-10 ENCOUNTER — Emergency Department (HOSPITAL_BASED_OUTPATIENT_CLINIC_OR_DEPARTMENT_OTHER)
Admission: EM | Admit: 2024-05-10 | Discharge: 2024-05-10 | Disposition: A | Discharge status: Home / Self Care | Attending: Emergency Medicine | Admitting: Emergency Medicine

## 2024-05-10 ENCOUNTER — Encounter (HOSPITAL_BASED_OUTPATIENT_CLINIC_OR_DEPARTMENT_OTHER): Payer: Self-pay

## 2024-05-10 ENCOUNTER — Other Ambulatory Visit (HOSPITAL_BASED_OUTPATIENT_CLINIC_OR_DEPARTMENT_OTHER): Payer: Self-pay

## 2024-05-10 ENCOUNTER — Other Ambulatory Visit: Payer: Self-pay

## 2024-05-10 DIAGNOSIS — E1165 Type 2 diabetes mellitus with hyperglycemia: Secondary | ICD-10-CM | POA: Insufficient documentation

## 2024-05-10 DIAGNOSIS — Z7984 Long term (current) use of oral hypoglycemic drugs: Secondary | ICD-10-CM | POA: Insufficient documentation

## 2024-05-10 DIAGNOSIS — M549 Dorsalgia, unspecified: Secondary | ICD-10-CM | POA: Diagnosis not present

## 2024-05-10 DIAGNOSIS — Z794 Long term (current) use of insulin: Secondary | ICD-10-CM | POA: Insufficient documentation

## 2024-05-10 DIAGNOSIS — Z7982 Long term (current) use of aspirin: Secondary | ICD-10-CM | POA: Diagnosis not present

## 2024-05-10 DIAGNOSIS — G8929 Other chronic pain: Secondary | ICD-10-CM | POA: Insufficient documentation

## 2024-05-10 DIAGNOSIS — R739 Hyperglycemia, unspecified: Secondary | ICD-10-CM

## 2024-05-10 DIAGNOSIS — F32A Depression, unspecified: Secondary | ICD-10-CM | POA: Diagnosis present

## 2024-05-10 LAB — COMPREHENSIVE METABOLIC PANEL WITH GFR
ALT: 16 U/L (ref 0–44)
AST: 15 U/L (ref 15–41)
Albumin: 3.9 g/dL (ref 3.5–5.0)
Alkaline Phosphatase: 81 U/L (ref 38–126)
Anion gap: 8 (ref 5–15)
BUN: 7 mg/dL (ref 6–20)
CO2: 29 mmol/L (ref 22–32)
Calcium: 9.5 mg/dL (ref 8.9–10.3)
Chloride: 100 mmol/L (ref 98–111)
Creatinine, Ser: 0.64 mg/dL (ref 0.44–1.00)
GFR, Estimated: >60 mL/min (ref >60)
Glucose, Bld: 373 mg/dL — ABNORMAL HIGH (ref 70–99)
Potassium: 4.3 mmol/L (ref 3.5–5.1)
Sodium: 138 mmol/L (ref 135–145)
Total Bilirubin: 0.2 mg/dL (ref 0.0–1.2)
Total Protein: 6.3 g/dL — ABNORMAL LOW (ref 6.5–8.1)

## 2024-05-10 LAB — I-STAT VENOUS BLOOD GAS, ED
Acid-Base Excess: 2 mmol/L (ref 0.0–2.0)
Bicarbonate: 27.6 mmol/L (ref 20.0–28.0)
Calcium, Ion: 1.19 mmol/L (ref 1.15–1.40)
HCT: 39 % (ref 36.0–46.0)
Hemoglobin: 13.3 g/dL (ref 12.0–15.0)
O2 Saturation: 94 %
Patient temperature: 98.6
Potassium: 4.2 mmol/L (ref 3.5–5.1)
Sodium: 137 mmol/L (ref 135–145)
TCO2: 29 mmol/L (ref 22–32)
pCO2, Ven: 45.3 mmHg (ref 44–60)
pH, Ven: 7.392 (ref 7.25–7.43)
pO2, Ven: 72 mmHg — ABNORMAL HIGH (ref 32–45)

## 2024-05-10 LAB — CBC WITH DIFFERENTIAL/PLATELET
Abs Immature Granulocytes: 0.03 K/uL (ref 0.00–0.07)
Basophils Absolute: 0 K/uL (ref 0.0–0.1)
Basophils Relative: 0 %
Eosinophils Absolute: 0.7 K/uL — ABNORMAL HIGH (ref 0.0–0.5)
Eosinophils Relative: 8 %
HCT: 39.3 % (ref 36.0–46.0)
Hemoglobin: 13.3 g/dL (ref 12.0–15.0)
Immature Granulocytes: 0 %
Lymphocytes Relative: 34 %
Lymphs Abs: 3.2 K/uL (ref 0.7–4.0)
MCH: 30.7 pg (ref 26.0–34.0)
MCHC: 33.8 g/dL (ref 30.0–36.0)
MCV: 90.8 fL (ref 80.0–100.0)
Monocytes Absolute: 0.6 K/uL (ref 0.1–1.0)
Monocytes Relative: 7 %
Neutro Abs: 4.8 K/uL (ref 1.7–7.7)
Neutrophils Relative %: 51 %
Platelets: 338 K/uL (ref 150–400)
RBC: 4.33 MIL/uL (ref 3.87–5.11)
RDW: 13.6 % (ref 11.5–15.5)
WBC: 9.5 K/uL (ref 4.0–10.5)
nRBC: 0 % (ref 0.0–0.2)

## 2024-05-10 LAB — CBG MONITORING, ED
Glucose-Capillary: 358 mg/dL — ABNORMAL HIGH (ref 70–99)
Glucose-Capillary: 192 mg/dL — ABNORMAL HIGH (ref 70–99)

## 2024-05-10 MED ORDER — OXYCODONE HCL 5 MG PO TABS
5.0000 mg | ORAL_TABLET | Freq: Four times a day (QID) | ORAL | 0 refills | Status: DC | PRN
  Filled 2024-05-10: qty 10, 3d supply, fill #0

## 2024-05-10 MED ORDER — OXYCODONE HCL 5 MG PO TABS
5.0000 mg | ORAL_TABLET | Freq: Once | ORAL | Status: AC
  Administered 2024-05-10: 5 mg via ORAL
  Filled 2024-05-10: qty 1

## 2024-05-10 MED ORDER — LORAZEPAM 1 MG PO TABS
0.5000 mg | ORAL_TABLET | Freq: Once | ORAL | Status: AC
  Administered 2024-05-10: 0.5 mg via ORAL
  Filled 2024-05-10: qty 1

## 2024-05-10 MED ORDER — ACETAMINOPHEN 500 MG PO TABS
1000.0000 mg | ORAL_TABLET | Freq: Once | ORAL | Status: AC
  Administered 2024-05-10: 1000 mg via ORAL
  Filled 2024-05-10: qty 2

## 2024-05-10 MED ORDER — INSULIN ASPART 100 UNIT/ML IJ SOLN
8.0000 [IU] | Freq: Once | INTRAMUSCULAR | Status: AC
  Administered 2024-05-10: 8 [IU] via SUBCUTANEOUS

## 2024-05-10 MED ORDER — SODIUM CHLORIDE 0.9 % IV BOLUS
1000.0000 mL | Freq: Once | INTRAVENOUS | Status: AC
  Administered 2024-05-10: 1000 mL via INTRAVENOUS

## 2024-05-10 MED ORDER — LIDOCAINE 5 % EX PTCH: 1.0000 | MEDICATED_PATCH | CUTANEOUS | Status: DC

## 2024-05-10 NOTE — ED Provider Notes (Addendum)
 [place] EMERGENCY DEPARTMENT AT [facility] Provider Note   CSN: [id number] Arrival date & time: 05/10/24  1021     Patient presents with: Depression   [name] is a 43 y.o. female.   Patient here with depression.  She lost her husband a couple months ago.  She has been depressed for couple weeks.  She is very tearful.  She states her antidepressants are not helping her.  She denies any suicidal ideation.  She does not have any thoughts of self-harm.  She has a history of chronic pain, diabetes.  Despite some ongoing chronic back pain she has been doing well physically but she has been very depressed.  The history is provided by the patient.       Prior to Admission medications   Medication Sig Start Date End Date Taking? Authorizing Provider  insulin  aspart (NOVOLOG ) 100 UNIT/ML injection See admin instructions. USE WITH OMNIPOD 5 PUMP. TOTAL 120 UNITS/DAY 04/24/24  Yes [provider]  oxyCODONE  (ROXICODONE ) 5 MG immediate release tablet Take 1 tablet (5 mg total) by mouth every 6 (six) hours as needed for up to 10 doses. 05/10/24  Yes Ryleeann Urquiza, DO  acetaminophen  (TYLENOL ) 500 MG tablet Take 1 tablet (500 mg total) by mouth every 6 (six) hours as needed. Patient taking differently: Take 500 mg by mouth every 6 (six) hours as needed for moderate pain. 04/26/22   Redwine, Madison A, PA-C  albuterol  (PROVENTIL ) (2.5 MG/3ML) 0.083% nebulizer solution Take 2.5 mg by nebulization every 6 (six) hours as needed for wheezing or shortness of breath. 11/29/22 11/29/23  [provider]  albuterol  (VENTOLIN  HFA) 108 (90 Base) MCG/ACT inhaler Inhale 1-2 puffs into the lungs every 6 (six) hours as needed for wheezing or shortness of breath. 12/02/22   Jillian Buttery, MD  amphetamine -dextroamphetamine  (ADDERALL) 30 MG tablet Take 30 mg by mouth daily. 09/28/20   [provider]  ARIPiprazole  (ABILIFY ) 10 MG tablet Take 10 mg by mouth every  morning. 01/21/21   [provider]  aspirin  EC 81 MG tablet Take 81 mg by mouth daily. Swallow whole.    [provider]  BD PEN NEEDLE NANO 2ND GEN 32G X 4 MM MISC daily. 09/23/20   [provider]  busPIRone  (BUSPAR ) 15 MG tablet Take 15 mg by mouth 3 (three) times daily. 10/14/22   [provider]  Continuous Blood Gluc Sensor (FREESTYLE LIBRE 3 SENSOR) MISC 1 each every 14 (fourteen) days. 11/21/22   [provider]  Empagliflozin-metFORMIN  HCl (SYNJARDY) 12.02-999 MG TABS Take 1 tablet by mouth 2 (two) times daily. 02/12/21   [provider]  ezetimibe  (ZETIA ) 10 MG tablet Take 1 tablet (10 mg total) by mouth daily. 09/26/17   Arloa Suzen RAMAN, NP  Fluticasone  Furoate-Vilanterol (BREO ELLIPTA ) 50-25 MCG/ACT AEPB Inhale 1 puff into the lungs daily in the afternoon. Patient not taking: Reported on 12/01/2022 08/13/22   Rojelio Nest, DO  furosemide  (LASIX ) 20 MG tablet Take 1 tablet (20 mg total) by mouth daily for 7 days. 11/15/23 11/22/23  Pamella Ozell LABOR, DO  gabapentin  (NEURONTIN ) 100 MG capsule Take 1 capsule (100 mg total) by mouth daily after breakfast. Patient taking differently: Take 100 mg by mouth every morning. 02/14/20   Lucilla Lynwood BRAVO, MD  gabapentin  (NEURONTIN ) 300 MG capsule Take 1-2 capsules (300-600 mg total) by mouth at bedtime. Only take as prescribed.  Husband is to administer medication Patient taking differently: Take 600 mg by mouth  at bedtime. 02/14/20   Nitka, James E, MD  HYDROcodone -acetaminophen  (NORCO/VICODIN) 5-325 MG tablet Take 1 tablet by mouth every 6 (six) hours as needed for moderate pain (pain score 4-6). 04/19/24   Zackowski, Scott, MD  hydrOXYzine  (VISTARIL ) 100 MG capsule Take 1 capsule (100 mg total) by mouth 3 (three) times daily as needed for itching. 11/19/23   Theadore Ozell HERO, MD  lisinopril  (PRINIVIL ,ZESTRIL ) 2.5 MG tablet Take 1 tablet (2.5 mg total) by mouth daily. 05/10/17   Arloa Suzen RAMAN, NP  Multiple  Vitamin (MULTIVITAMIN WITH MINERALS) TABS tablet Take 1 tablet by mouth every morning.    [provider]  NOVOLOG  MIX 70/30 FLEXPEN (70-30) 100 UNIT/ML FlexPen Inject 44 Units into the skin 2 (two) times daily. 11/25/22   [provider]  Omega-3 Fatty Acids (FISH OIL PO) Take 1 capsule by mouth every morning.    [provider]  pravastatin  (PRAVACHOL ) 80 MG tablet Take 1 tablet (80 mg total) by mouth daily. Patient taking differently: Take 80 mg by mouth every morning. 05/11/17   Arloa Suzen RAMAN, NP  Semaglutide , 1 MG/DOSE, (OZEMPIC , 1 MG/DOSE,) 4 MG/3ML SOPN Inject 2 mg into the skin once a week. Friday 02/12/21   [provider]  sertraline  (ZOLOFT ) 100 MG tablet Take 2 tablets (200 mg total) by mouth daily. For depression and anxiety. 06/13/13   Rochel Aureliano DASEN, PA-C  traZODone  (DESYREL ) 100 MG tablet Take 200 mg by mouth at bedtime. 01/21/21   [provider]    Allergies: Orange oil, Penicillins, Carisoprodol, and Piroxicam    Review of Systems  Updated Vital Signs BP (!) 151/95 (BP Location: Right Arm)   Pulse (!) 109   Temp 98.1 F (36.7 C) (Oral)   Resp 17   LMP 04/05/2024 (Approximate)   SpO2 93%   Physical Exam Vitals and nursing note reviewed.  Constitutional:      General: She is not in acute distress.    Appearance: She is well-developed.  HENT:     Head: Normocephalic and atraumatic.     Nose: Nose normal.     Mouth/Throat:     Mouth: Mucous membranes are moist.  Eyes:     Extraocular Movements: Extraocular movements intact.     Conjunctiva/sclera: Conjunctivae normal.     Pupils: Pupils are equal, round, and reactive to light.  Cardiovascular:     Rate and Rhythm: Normal rate and regular rhythm.     Heart sounds: No murmur heard. Pulmonary:     Effort: Pulmonary effort is normal. No respiratory distress.     Breath sounds: Normal breath sounds.  Abdominal:     Palpations: Abdomen is soft.     Tenderness: There is  no abdominal tenderness.  Musculoskeletal:        General: No swelling.     Cervical back: Neck supple.  Skin:    General: Skin is warm and dry.     Capillary Refill: Capillary refill takes less than 2 seconds.  Neurological:     Mental Status: She is alert.  Psychiatric:     Comments: Depressed, tearful     (all labs ordered are listed, but only abnormal results are displayed) Labs Reviewed  CBC WITH DIFFERENTIAL/PLATELET - Abnormal; Notable for the following components:      Result Value   Eosinophils Absolute 0.7 (*)    All other components within normal limits  COMPREHENSIVE METABOLIC PANEL WITH GFR - Abnormal; Notable for the following components:   Glucose,  Bld 373 (*)    Total Protein 6.3 (*)    All other components within normal limits  CBG MONITORING, ED - Abnormal; Notable for the following components:   Glucose-Capillary 358 (*)    All other components within normal limits  I-STAT VENOUS BLOOD GAS, ED - Abnormal; Notable for the following components:   pO2, Ven 72 (*)    All other components within normal limits  CBG MONITORING, ED - Abnormal; Notable for the following components:   Glucose-Capillary 192 (*)    All other components within normal limits    EKG: None  Radiology: No results found.   Procedures   Medications Ordered in the ED  lidocaine  (LIDODERM ) 5 % 1 patch (1 patch Transdermal Not Given 05/10/24 1141)  LORazepam  (ATIVAN ) tablet 0.5 mg (0.5 mg Oral Given 05/10/24 1105)  sodium chloride  0.9 % bolus 1,000 mL (0 mLs Intravenous Stopped 05/10/24 1408)  acetaminophen  (TYLENOL ) tablet 1,000 mg (1,000 mg Oral Given 05/10/24 1140)  oxyCODONE  (Oxy IR/ROXICODONE ) immediate release tablet 5 mg (5 mg Oral Given 05/10/24 1140)  insulin  aspart (novoLOG ) injection 8 Units (8 Units Subcutaneous Given 05/10/24 1200)                                    Medical Decision Making Amount and/or Complexity of Data Reviewed Labs: ordered.  Risk OTC  drugs. Prescription drug management.   [name] is here with depression.  Unremarkable vitals.  No fever.  Patient's depressed and tearful on exam.  Recently lost her husband and has been dealing well with that.  At that presents have not helped.  She denies any SI.  No thoughts of self-harm.  She is dealing with some chronic back pain as well.  Overall we will give her a dose of Ativan  will talk with behavioral health about sending her to urgent care for evaluation.  Lab work is overall unremarkable.  Given IV fluids.  Given insulin .  Medically cleared for psych evaluation.  Overall she is I like to have her talk with psychiatry given that I think she would greatly benefit from evaluation may be medication adjustment and follow-up.    CBG improved to under 200. Narcotic pain meds given for chronic pain. No concern for acute back emergency. Believe today visit is mainly regarding her chronic pain but I think it would still be helpful if patient could talk with psych about meds. Due to sugar being elevate [facility] wouldn't accept in transfer but now CBG is better.  Ultimately we were having IT issues with behavioral health consult could not get good connection for [facility] eval.  I reengage patient bc she is wanting to leave.  Overall I do not have any concern that she is going to harm herself or she is suicidal.  She is consistently stated that she is not suicidal does not want to harm herself.  She states that she has a 83 year old son that she needs to take care of.  She has been dealing a lot with her chronic back pain.  She does not have insurance.  It has been difficult to get/afford her chronic pain medicine.  She cannot afford to go see chronic pain team.  She was getting narcotic prescriptions with primary care in the past but now intermittently with ED.  She has been following with spine team in the past but has never been approved for surgery.  Overall I had a very long discussion with  her.  I really think she benefit from outpatient psychiatric care and maybe some cognitive behavioral therapy and grief therapy.  I am able to contract for safety with her.  I will prescribe her a short course of her narcotic pain medicine as well. Shewas given warning on using thee meds appropriately.  Ultimately I gave her the option to try to stay longer to try to get the IT problem figured out or  did give her the option to just go to behavioral health urgent care at any point or call them and arrange for an outpatient follow-up.  Ultimately patient safe for discharge. This chart was dictated using voice recognition software.  Despite best efforts to proofread,  errors can occur which can change the documentation meaning.      Final diagnoses:  Depression, unspecified depression type  Hyperglycemia  Chronic back pain, unspecified back location, unspecified back pain laterality    ED Discharge Orders          Ordered    oxyCODONE  (ROXICODONE ) 5 MG immediate release tablet  Every 6 hours PRN        05/10/24 1458               Ruthe Cornet, DO 05/10/24 1402    Ruthe Cornet, DO 05/10/24 1501    Ruthe Cornet, DO 05/10/24 1614    Conal Shetley, DO 05/10/24 1617

## 2024-05-10 NOTE — ED Triage Notes (Signed)
 Patient reports her husband died a couple months ago and she has been depressed for a couple weeks. She is very tearful in triage.  Her provider tried to put her on Buspirone  but she reports it is not working.   She denies SI in triage. She states I want to be with him but I don't want to die. She also does not report self harm.

## 2024-05-10 NOTE — [facility] Counselor (Signed)
 TTS Consult has been deferred to [facility] Telehealth. [facility] added to secure chat.  [facility] care coordinator will enter a time the provider can see this patient.

## 2024-05-10 NOTE — Discharge Instructions (Addendum)
 As we discussed take your pain medicine as prescribed.  Do not mix with alcohol drugs or dangerous activities including driving.  I recommend that you call behavioral health center and ask for an outpatient follow-up.  If you are feeling suicidal or having thoughts of harming yourself please go to [facility] or [facility] as we discussed.  Follow-up with spine doctor.  Follow-up with primary care for further pain management.

## 2024-05-10 NOTE — ED Notes (Signed)
 Having problems with TTS cart. Calling IT trying to trouble shoot.

## 2024-05-10 NOTE — ED Notes (Signed)
 Psych had an ETA of seeing patient at 1415. Provider made aware.

## 2024-05-14 ENCOUNTER — Other Ambulatory Visit: Payer: Self-pay

## 2024-05-14 ENCOUNTER — Emergency Department (HOSPITAL_BASED_OUTPATIENT_CLINIC_OR_DEPARTMENT_OTHER)
Admission: EM | Admit: 2024-05-14 | Discharge: 2024-05-15 | Disposition: A | Discharge status: Home / Self Care | Attending: Emergency Medicine | Admitting: Emergency Medicine

## 2024-05-14 ENCOUNTER — Encounter (HOSPITAL_BASED_OUTPATIENT_CLINIC_OR_DEPARTMENT_OTHER): Payer: Self-pay

## 2024-05-14 DIAGNOSIS — E1165 Type 2 diabetes mellitus with hyperglycemia: Secondary | ICD-10-CM | POA: Insufficient documentation

## 2024-05-14 DIAGNOSIS — M545 Low back pain, unspecified: Secondary | ICD-10-CM | POA: Insufficient documentation

## 2024-05-14 DIAGNOSIS — Z794 Long term (current) use of insulin: Secondary | ICD-10-CM | POA: Diagnosis not present

## 2024-05-14 DIAGNOSIS — Z7982 Long term (current) use of aspirin: Secondary | ICD-10-CM | POA: Insufficient documentation

## 2024-05-14 DIAGNOSIS — G8929 Other chronic pain: Secondary | ICD-10-CM | POA: Diagnosis not present

## 2024-05-14 DIAGNOSIS — J45909 Unspecified asthma, uncomplicated: Secondary | ICD-10-CM | POA: Insufficient documentation

## 2024-05-14 DIAGNOSIS — F4321 Adjustment disorder with depressed mood: Secondary | ICD-10-CM

## 2024-05-14 DIAGNOSIS — Z7951 Long term (current) use of inhaled steroids: Secondary | ICD-10-CM | POA: Diagnosis not present

## 2024-05-14 DIAGNOSIS — Z9641 Presence of insulin pump (external) (internal): Secondary | ICD-10-CM | POA: Insufficient documentation

## 2024-05-14 DIAGNOSIS — F4381 Prolonged grief disorder: Secondary | ICD-10-CM | POA: Diagnosis not present

## 2024-05-14 LAB — URINALYSIS, ROUTINE W REFLEX MICROSCOPIC
Bacteria, UA: NONE SEEN
Bilirubin Urine: NEGATIVE
Glucose, UA: >1000 mg/dL — AB
Hgb urine dipstick: NEGATIVE
Ketones, ur: NEGATIVE mg/dL
Leukocytes,Ua: NEGATIVE
Nitrite: NEGATIVE
Protein, ur: NEGATIVE mg/dL
Specific Gravity, Urine: 1.028 (ref 1.005–1.030)
pH: 6 (ref 5.0–8.0)

## 2024-05-14 LAB — I-STAT VENOUS BLOOD GAS, ED
Acid-Base Excess: 6 mmol/L — ABNORMAL HIGH (ref 0.0–2.0)
Bicarbonate: 32.5 mmol/L — ABNORMAL HIGH (ref 20.0–28.0)
Calcium, Ion: 1.18 mmol/L (ref 1.15–1.40)
HCT: 39 % (ref 36.0–46.0)
Hemoglobin: 13.3 g/dL (ref 12.0–15.0)
O2 Saturation: 83 %
Potassium: 3.7 mmol/L (ref 3.5–5.1)
Sodium: 136 mmol/L (ref 135–145)
TCO2: 34 mmol/L — ABNORMAL HIGH (ref 22–32)
pCO2, Ven: 53.4 mmHg (ref 44–60)
pH, Ven: 7.392 (ref 7.25–7.43)
pO2, Ven: 49 mmHg — ABNORMAL HIGH (ref 32–45)

## 2024-05-14 LAB — CBC
HCT: 38.8 % (ref 36.0–46.0)
Hemoglobin: 13.3 g/dL (ref 12.0–15.0)
MCH: 30.9 pg (ref 26.0–34.0)
MCHC: 34.3 g/dL (ref 30.0–36.0)
MCV: 90 fL (ref 80.0–100.0)
Platelets: 401 K/uL — ABNORMAL HIGH (ref 150–400)
RBC: 4.31 MIL/uL (ref 3.87–5.11)
RDW: 13.3 % (ref 11.5–15.5)
WBC: 8.7 K/uL (ref 4.0–10.5)
nRBC: 0 % (ref 0.0–0.2)

## 2024-05-14 LAB — BETA-HYDROXYBUTYRIC ACID: Beta-Hydroxybutyric Acid: 0.06 mmol/L (ref 0.05–0.27)

## 2024-05-14 LAB — BASIC METABOLIC PANEL WITH GFR
Anion gap: 13 (ref 5–15)
BUN: <5 mg/dL — ABNORMAL LOW (ref 6–20)
CO2: 26 mmol/L (ref 22–32)
Calcium: 9.3 mg/dL (ref 8.9–10.3)
Chloride: 98 mmol/L (ref 98–111)
Creatinine, Ser: 0.65 mg/dL (ref 0.44–1.00)
GFR, Estimated: >60 mL/min (ref >60)
Glucose, Bld: 324 mg/dL — ABNORMAL HIGH (ref 70–99)
Potassium: 3.8 mmol/L (ref 3.5–5.1)
Sodium: 138 mmol/L (ref 135–145)

## 2024-05-14 LAB — PREGNANCY, URINE: Preg Test, Ur: NEGATIVE

## 2024-05-14 LAB — CBG MONITORING, ED
Glucose-Capillary: 311 mg/dL — ABNORMAL HIGH (ref 70–99)
Glucose-Capillary: 285 mg/dL — ABNORMAL HIGH (ref 70–99)

## 2024-05-14 MED ORDER — HYDROCODONE-ACETAMINOPHEN 5-325 MG PO TABS
1.0000 | ORAL_TABLET | Freq: Once | ORAL | Status: AC
  Administered 2024-05-14: 1 via ORAL
  Filled 2024-05-14: qty 1

## 2024-05-14 MED ORDER — SODIUM CHLORIDE 0.9 % IV BOLUS
1000.0000 mL | Freq: Once | INTRAVENOUS | Status: AC
  Administered 2024-05-14: 1000 mL via INTRAVENOUS

## 2024-05-14 MED ORDER — INSULIN ASPART 100 UNIT/ML IJ SOLN
6.0000 [IU] | Freq: Once | INTRAMUSCULAR | Status: AC
  Administered 2024-05-14: 6 [IU] via SUBCUTANEOUS

## 2024-05-14 MED ORDER — ONDANSETRON HCL 4 MG/2ML IJ SOLN
4.0000 mg | Freq: Once | INTRAMUSCULAR | Status: AC
  Administered 2024-05-14: 4 mg via INTRAVENOUS
  Filled 2024-05-14: qty 2

## 2024-05-14 MED ORDER — MORPHINE SULFATE (PF) 4 MG/ML IV SOLN
4.0000 mg | Freq: Once | INTRAVENOUS | Status: AC
  Administered 2024-05-14: 4 mg via INTRAVENOUS
  Filled 2024-05-14: qty 1

## 2024-05-14 NOTE — ED Triage Notes (Signed)
 Pt presents with diabetic complications after her CGM and insulin  delivery device malfunctioned. She reports that she has not received her insulin  in 2 days. Today she had an unwitnessed syncopal episode. Pt reports nausea, HA, and body aches. Pt has not checked her blood sugar today.

## 2024-05-14 NOTE — ED Provider Notes (Signed)
 [name] EMERGENCY DEPARTMENT AT [facility] Provider Note   CSN: [id number] Arrival date & time: 05/14/24  8077     Patient presents with: Blood Sugar Problem   [name] is a 43 y.o. female.   Pt is a 43 yo female with pmhx significant for DM2, anxiety, OCD, depression, chronic pain, and asthma.  Pt said her omnipod insulin  pump gave her an error 2 days ago.  She had another one sent to her, but it won't arrive until tomorrow, so she has not had any insulin .  She has been taking her synjardy pills.  She has not been checking her BS.  She does not feel well, so came in.  She has had significant depression since her husband died suddenly in 12/08/2023.  No si/hi.       Prior to Admission medications   Medication Sig Start Date End Date Taking? Authorizing Provider  acetaminophen  (TYLENOL ) 500 MG tablet Take 1 tablet (500 mg total) by mouth every 6 (six) hours as needed. Patient taking differently: Take 500 mg by mouth every 6 (six) hours as needed for moderate pain. 04/26/22   Redwine, Madison A, PA-C  albuterol  (PROVENTIL ) (2.5 MG/3ML) 0.083% nebulizer solution Take 2.5 mg by nebulization every 6 (six) hours as needed for wheezing or shortness of breath. 11/29/22 11/29/23  [provider]  albuterol  (VENTOLIN  HFA) 108 (90 Base) MCG/ACT inhaler Inhale 1-2 puffs into the lungs every 6 (six) hours as needed for wheezing or shortness of breath. 12/02/22   Jillian Buttery, MD  amphetamine -dextroamphetamine  (ADDERALL) 30 MG tablet Take 30 mg by mouth daily. 09/28/20   [provider]  ARIPiprazole  (ABILIFY ) 10 MG tablet Take 10 mg by mouth every morning. 01/21/21   [provider]  aspirin  EC 81 MG tablet Take 81 mg by mouth daily. Swallow whole.    [provider]  BD PEN NEEDLE NANO 2ND GEN 32G X 4 MM MISC daily. 09/23/20   [provider]  busPIRone  (BUSPAR ) 15 MG tablet Take 15 mg by mouth 3 (three) times daily. 10/14/22   [provider]  Continuous Blood Gluc Sensor (FREESTYLE LIBRE 3 SENSOR) MISC 1 each every 14 (fourteen) days. 12-07-22   [provider]  Empagliflozin-metFORMIN  HCl (SYNJARDY) 12.02-999 MG TABS Take 1 tablet by mouth 2 (two) times daily. 02/12/21   [provider]  ezetimibe  (ZETIA ) 10 MG tablet Take 1 tablet (10 mg total) by mouth daily. 09/26/17   Arloa Suzen RAMAN, NP  Fluticasone  Furoate-Vilanterol (BREO ELLIPTA ) 50-25 MCG/ACT AEPB Inhale 1 puff into the lungs daily in the afternoon. Patient not taking: Reported on 12/01/2022 08/13/22   Rojelio Nest, DO  furosemide  (LASIX ) 20 MG tablet Take 1 tablet (20 mg total) by mouth daily for 7 days. 11/15/23 12/08/2023  Pamella Ozell LABOR, DO  gabapentin  (NEURONTIN ) 100 MG capsule Take 1 capsule (100 mg total) by mouth daily after breakfast. Patient taking differently: Take 100 mg by mouth every morning. 02/14/20   Lucilla Lynwood BRAVO, MD  gabapentin  (NEURONTIN ) 300 MG capsule Take 1-2 capsules (300-600 mg total) by mouth at bedtime. Only take as prescribed.  Husband is to administer medication Patient taking differently: Take 600 mg by mouth at bedtime. 02/14/20   Nitka, James E, MD  HYDROcodone -acetaminophen  (NORCO/VICODIN) 5-325 MG tablet Take 1 tablet by mouth every 6 (six) hours as needed for moderate pain (pain score 4-6). 04/19/24   Zackowski, Scott, MD  hydrOXYzine  (VISTARIL ) 100 MG capsule Take 1 capsule (  100 mg total) by mouth 3 (three) times daily as needed for itching. 11/19/23   Bero, Michael M, MD  insulin  aspart (NOVOLOG ) 100 UNIT/ML injection See admin instructions. USE WITH OMNIPOD 5 PUMP. TOTAL 120 UNITS/DAY 04/24/24   [provider]  lisinopril  (PRINIVIL ,ZESTRIL ) 2.5 MG tablet Take 1 tablet (2.5 mg total) by mouth daily. 05/10/17   Arloa Suzen RAMAN, NP  Multiple Vitamin (MULTIVITAMIN WITH MINERALS) TABS tablet Take 1 tablet by mouth every morning.    [provider]  NOVOLOG  MIX 70/30 FLEXPEN (70-30) 100 UNIT/ML FlexPen  Inject 44 Units into the skin 2 (two) times daily. 11/25/22   [provider]  Omega-3 Fatty Acids (FISH OIL PO) Take 1 capsule by mouth every morning.    [provider]  oxyCODONE  (ROXICODONE ) 5 MG immediate release tablet Take 1 tablet (5 mg total) by mouth every 6 (six) hours as needed for up to 10 doses. 05/10/24   Curatolo, Adam, DO  pravastatin  (PRAVACHOL ) 80 MG tablet Take 1 tablet (80 mg total) by mouth daily. Patient taking differently: Take 80 mg by mouth every morning. 05/11/17   Arloa Suzen RAMAN, NP  Semaglutide , 1 MG/DOSE, (OZEMPIC , 1 MG/DOSE,) 4 MG/3ML SOPN Inject 2 mg into the skin once a week. Friday 02/12/21   [provider]  sertraline  (ZOLOFT ) 100 MG tablet Take 2 tablets (200 mg total) by mouth daily. For depression and anxiety. 06/13/13   Rochel Aureliano DASEN, PA-C  traZODone  (DESYREL ) 100 MG tablet Take 200 mg by mouth at bedtime. 01/21/21   [provider]    Allergies: Orange oil, Penicillins, Carisoprodol, and Piroxicam    Review of Systems  Neurological:  Positive for weakness.  Psychiatric/Behavioral:  Positive for dysphoric mood.   All other systems reviewed and are negative.   Updated Vital Signs BP 127/60 (BP Location: Left Arm)   Pulse (!) 110   Temp 98.4 F (36.9 C) (Oral)   Resp 20   Ht 5' 6 (1.676 m)   Wt 95.3 kg   LMP 05/12/2024 (Approximate)   SpO2 95%   BMI 33.89 kg/m   Physical Exam Vitals and nursing note reviewed.  Constitutional:      Appearance: Normal appearance. She is obese.  HENT:     Head: Normocephalic and atraumatic.     Right Ear: External ear normal.     Left Ear: External ear normal.     Nose: Nose normal.     Mouth/Throat:     Mouth: Mucous membranes are dry.  Eyes:     Extraocular Movements: Extraocular movements intact.     Conjunctiva/sclera: Conjunctivae normal.     Pupils: Pupils are equal, round, and reactive to light.  Cardiovascular:     Rate and Rhythm: Normal rate and regular  rhythm.     Pulses: Normal pulses.     Heart sounds: Normal heart sounds.  Pulmonary:     Effort: Pulmonary effort is normal.     Breath sounds: Normal breath sounds.  Abdominal:     General: Abdomen is flat. Bowel sounds are normal.     Palpations: Abdomen is soft.  Musculoskeletal:        General: Normal range of motion.     Cervical back: Normal range of motion and neck supple.  Skin:    General: Skin is warm.     Capillary Refill: Capillary refill takes less than 2 seconds.  Neurological:     General: No focal deficit present.  Mental Status: She is alert and oriented to person, place, and time.  Psychiatric:        Mood and Affect: Affect is tearful.     (all labs ordered are listed, but only abnormal results are displayed) Labs Reviewed  BASIC METABOLIC PANEL WITH GFR - Abnormal; Notable for the following components:      Result Value   Glucose, Bld 324 (*)    BUN <5 (*)    All other components within normal limits  CBC - Abnormal; Notable for the following components:   Platelets 401 (*)    All other components within normal limits  URINALYSIS, ROUTINE W REFLEX MICROSCOPIC - Abnormal; Notable for the following components:   Glucose, UA >1,000 (*)    All other components within normal limits  CBG MONITORING, ED - Abnormal; Notable for the following components:   Glucose-Capillary 311 (*)    All other components within normal limits  I-STAT VENOUS BLOOD GAS, ED - Abnormal; Notable for the following components:   pO2, Ven 49 (*)    Bicarbonate 32.5 (*)    TCO2 34 (*)    Acid-Base Excess 6.0 (*)    All other components within normal limits  CBG MONITORING, ED - Abnormal; Notable for the following components:   Glucose-Capillary 285 (*)    All other components within normal limits  PREGNANCY, URINE  BETA-HYDROXYBUTYRIC ACID  CBG MONITORING, ED    EKG: EKG Interpretation Date/Time:  Monday May 14 2024 22:28:19 EDT Ventricular Rate:  98 PR  Interval:  161 QRS Duration:  75 QT Interval:  282 QTC Calculation: 360 R Axis:   92  Text Interpretation: Sinus rhythm Borderline right axis deviation Low voltage, precordial leads Borderline T wave abnormalities No significant change since last tracing Confirmed by Dean Clarity [phone]) on 05/14/2024 10:32:26 PM  Radiology: No results found.   Procedures   Medications Ordered in the ED  morphine  (PF) 4 MG/ML injection 4 mg (has no administration in time range)  ondansetron  (ZOFRAN ) injection 4 mg (has no administration in time range)  sodium chloride  0.9 % bolus 1,000 mL (0 mLs Intravenous Stopped 05/14/24 2217)  insulin  aspart (novoLOG ) injection 6 Units (6 Units Subcutaneous Given 05/14/24 2118)  HYDROcodone -acetaminophen  (NORCO/VICODIN) 5-325 MG per tablet 1 tablet (1 tablet Oral Given 05/14/24 2119)                                    Medical Decision Making Amount and/or Complexity of Data Reviewed Labs: ordered.  Risk OTC drugs. Prescription drug management.   This patient presents to the ED for concern of weakness, this involves an extensive number of treatment options, and is a complaint that carries with it a high risk of complications and morbidity.  The differential diagnosis includes dka, hyperglycemia, hhs, infection   Co morbidities that complicate the patient evaluation  DM2, anxiety, OCD, depression, chronic pain, and asthma   Additional history obtained:  Additional history obtained from epic chart review  Lab Tests:  I Ordered, and personally interpreted labs.  The pertinent results include:  cbc nl, bmp nl other than glucose elevated at 324, preg neg; VBG with nl pH; ua nl; preg neg; bhb neg   Cardiac Monitoring:  The patient was maintained on a cardiac monitor.  I personally viewed and interpreted the cardiac monitored which showed an underlying rhythm of: nsr   Medicines ordered and prescription drug management:  I ordered medication including  ivfs/insulin   for sx  Reevaluation of the patient after these medicines showed that the patient improved I have reviewed the patients home medicines and have made adjustments as needed   Problem List / ED Course:  Hyperglycemia:  improved after fluids and insulin .  Pt getting her omnipod tomorrow.  She is not in DKA.  She is to eat a diabetic diet. Grief:  pt is not suicidal.  She does not want to see psych tonight.  She does not have a counselor and is encouraged to get one as she's not been able to deal well with her husband dying in Feb. Chronic back pain:  pt needs to see pcp/ortho for further mgmt as an outpatient.   Reevaluation:  After the interventions noted above, I reevaluated the patient and found that they have :improved   Social Determinants of Health:  Lives at home   Dispostion:  After consideration of the diagnostic results and the patients response to treatment, I feel that the patent would benefit from discharge with outpatient f/u.       Final diagnoses:  Type 2 diabetes mellitus with hyperglycemia, with long-term current use of insulin  (HCC)  Grief  Chronic low back pain without sciatica, unspecified back pain laterality    ED Discharge Orders     None          Dean Clarity, MD 05/14/24 2242

## 2024-05-15 LAB — CBG MONITORING, ED: Glucose-Capillary: 238 mg/dL — ABNORMAL HIGH (ref 70–99)

## 2024-08-13 ENCOUNTER — Encounter: Payer: Self-pay | Admitting: Radiology

## 2024-09-12 ENCOUNTER — Telehealth (HOSPITAL_COMMUNITY): Payer: Self-pay | Admitting: Licensed Clinical Social Worker
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
## 2024-09-18 NOTE — Discharge Summary (Signed)
 "   Psychiatry Discharge Summary   Admit date: 09/15/2024  1:25 AM  Discharge date and time: 09/18/2024 11:17 AM  Discharge Physician: Powell Berg, MD,  (Attending Physician)  Time Spent on Discharge Summary: [facility] Course   [name] is a 43 y.o. with a psychiatric history significant for depression and opioid use disorder, who presented to ED for detox from fentanyl ..     Per ED consult: Patient states that she has been snorting this drug for the past three years. Patient states that she is tired of living this way and states, I cannot do this anymore. Patient states that she snorts Fentanyl  daily, but could not provide an amount that she uses. Her last use was prior to her arrival to the ED. She denies any other drug or alcohol use. She has current complaints of withdrawal symptoms which include: sweats, restlessness, burning feeling, shakiness. Patient states that she recently attempted to get help at the [facility], but states that she states that she had a bad experience there with staff and states that she got upset and left AMA in less than 24 hours of her admission. She has no plans for long-term rehab following her detox.  On 09/15/2024  1:25 AM this patient was voluntarily admitted to the psychiatric unit and placed on detox and elopement precautions. The patient was seen daily and case discussed in a multidisciplinary team meeting.  During the course of the hospitalization,  Opioid Use Disorder - started using fentanyl  (snorting) 3 years ago after running out of oxy for back pain, friend got her some from street, was pressed w/ fentanyl  and she reports becoming immediately addicted, endorsing daily use // also endorses use of methamphetamines and heroin, denies any injection of drugs - reports recently attempting to get help at [facility], bad experience, left AMA - last used prior to coming to ED  12/6 - endorsing W/D sx: shakes, sweating, restlessness, burning feeling - COWS 15, 11, 9, 11 thus far - PRN meds for withdrawal symptoms - 12/6: started clonidine  taper 0.1mg  PO QID x 8 doses 12/8: discontinue clonidine  taper so that blood pressure can be more closely monitored.  Reports typically having low blood pressure at home.  Continuing methadone taper but patient is encouraged to consider outpatient methadone clinic upon discharge.  She is reporting worsening back pain with taper of methadone.    Bipolar Disorder - patient endorses diagnosis a couple years ago, currently denies manic or depressive symptoms - denies clinical depression, reports her depression is situational  - continue Sertraline  200mg  PO daily - continue Abilify  10mg  PO daily   Type 2 Diabetes - hospitalist following per 12/8 note: Type 2 diabetes mellitus (CMD) Neuropathy Last a1c results: 8.8 on 09/15/2024 Followed by outpatient endocrinology. Utilizes OmniPod 5 insulin  pump and synjardy 12.5-1,000 mg BID. Per endocrinology note from 09/15/2023, patient at that time prior to OmniPod was on Tresiba 50 units daily, NovoLog  15 units 3 times daily, Synjardy 12.02-999 mg twice daily.  - Patient states she was filling her pump with 200 units and it would last about 4 days, sometimes more, sometimes less.  - Synjardy not available on formulary. Will continue with metformin  XR 2,000 mg daily while inpatient. Can resume home synjardy on discharge.  - Will increase lantus  to 30 units nightly and lispro to 0-8 units TID with meals, adjust as appropriate - FSBS ACHS and SSI TID AC - Consistent  carb diet - Hypoglycemia protocol - Continue home gabapentin  HLD (hyperlipidemia) - Continue home zetia  and statin HTN (hypertension) - Continue home lisinopril   Asthma (CMD) - PRN albuterol     Tobacco Use Disorder - reports daily use - Nicoderm patch 21mg /24hrs   OSA - 12/7: ordered respiratory protocol to initiate  CPAP - plan to move to medical bed on unit once available   Chronic Back Pain - previously going to [facility] (stopped going 84yrs ago d/t +fentanyl ).  Does not currently have a pain clinic so methadone clinic is advised.  - reports disc degeneration and bulging discs, says she is supposed to be getting a fusion - she says normally she would be on daily oxy for her back pain but states she can manage it without opioids at this time.   At time of discharge, patient denies SI, HI, AVH and is able to contract for safety. She denies side effects to medications or withdrawals.  She states her appetite is low due to medicine smell on unit.  Reports reduced sleep due to uncomfortable bed.  States she does have back pain which she feels she can manage without opioids at home.  Reports feeling ready for discharge.   The following medications were started/changed during the hospitalization:  - The following home medications were continued:       Sertraline  200mg  PO daily       Abilify  10mg  PO daily       Gabapentin  100mg  PO daily       Lisinopril  2.5mg  PO daily       Trazadone 200mg  q HS       Pravastatin  80mg  PO q HS       Ezetimibe  10mg  PO q HS - The following home medications were not continued: (but will continue upon hospital discharge)       Synjardy BID (not on hospital formulary)       Hydroxyzine  50mg  PO q HS - The following new medications were initiated during hospitalization but discontinued at discharge.        Lantus  30u subQ q HS       sliding scale HumaLOG inj 0-15u q AC/HS       Metformin  2000mg  PO daily w/ breakfast    Behavior while hospitalized: cooperative and pleasant, utilized CPAP PRN medications required: tylenolol, atarax , zofran , propranolol, tizanidine , trazodone  Medication compliance: Yes Group therapy attendance: No  Medical issues addressed during this admission: HTN, DM, sleep apnea, back pain.  Admission labs revealed the  following pertinent positives:  CBC: WBC 13.75 CMP: glucose 189   Alcohol Biomarkers: MCV: 88.3 /GGT: none /AST: 15 /ALT: 17 UDS: positive for fentanyl  & opiates   Alcohol: <10 Imaging <redacted file path>: No results found. Hgb A1c: 8.8   Lipid Profile: unremarkable TSH: 2.5 EKG Qtc: 449 (prior to methadone taper initiation)   UA: not done Urine Preg: negative   On 09/18/2024 11:17 AM the treatment team decided to discharge [name] home with scheduled outpatient mental health follow up appointments. Risks, benefits and side effects of medications were discussed in detail prior to discharge. [name] was duly informed to call 911 or go to the nearest emergency department if patient becomes overtly distressed or develops suicidal, homicidal or other violent ideation and they verbalized understanding.   Follow-up   Discharge Follow-Up Plan:   Clinical Follow-up (MH and/or SA Services):  Name of Provider Agency Referred: [facility] Date/Time of Appointment: You have  been referred. You will be contacted via email and phone to schedule intake.  Phone Number: [phone] or [phone]           Reason: INTENSIVE OUTPATIENT PROGRAM   Transportation: VIRTUAL   **Please bring photo ID, social security card, insurance card, or proof of household income if they do not have insurance.**     Crisis Support Line: [phone]   OR  Discharge Follow-Up Plan:  Clinical Follow-up (MH and/or SA Services):  Name of Provider Agency Referred: [facility] Date/Time of Appointment: Walk-in hours are Monday through Friday 7am to 10:30am Phone: [phone] Address: [address] [place], [place] [address] Reason: MEDICATION ASSISTED TREATMENT/SUBSTANCE USE IOP  Transportation:    **Please bring photo ID, social security card, insurance card, or proof of household income if they do not have insurance.**  Crisis Support Line: [phone]   Discharge Screening   Multiple  Antipsychotics:  Monotherapy  FDA-approved cessation medication prescribed at discharge: Nicotine  Patch Note: if patient is heavy user, (average volume of five or more cigarettes per day and/or cigars daily and/or pipes daily during the past 30 days), the patient is required to have FDA-approved cessation medication (nicotine  patch/gum, bupropion, varenicline etc),  or a documented reason for not prescribing it.   Labs/Imaging During Admission   Is the patient currently on an antipsychotic or will possibly be discharged on an antipsychotic?  yes  Results for orders placed or performed during the hospital encounter of 09/15/24  Ethanol  Result Value Ref Range   Ethanol <10 <10 mg/dL  Drug of Abuse 7 Panel  Result Value Ref Range   Amphetamines Screen, Urine Negative Negative   Barbiturates Screen, Urine Negative Negative   Benzodiazepines Screen, Urine Negative Negative   Cocaine Screen, Urine Negative Negative   Opiates Screen, Urine Positive (A) Negative   Fentanyl  Screen, Urine Positive (A) Negative   Marijuana (THC) Screen, Urine Negative Negative   Creatinine, Urine 102 >=20 mg/dL  Comprehensive Metabolic Panel  Result Value Ref Range   Sodium 135 (L) 136 - 145 mmol/L   Potassium 3.7 3.4 - 4.5 mmol/L   Chloride 98 98 - 107 mmol/L   CO2 26 21 - 31 mmol/L   Anion Gap 11 6 - 14 mmol/L   Glucose, Random 189 (H) 70 - 99 mg/dL   Blood Urea Nitrogen (BUN) 8 7 - 25 mg/dL   Creatinine 9.40 (L) 9.39 - 1.20 mg/dL   eGFR >09 >40 fO/fpw/8.26f7   Albumin 4.5 3.5 - 5.7 g/dL   Total Protein 7.2 6.4 - 8.9 g/dL   Bilirubin, Total 0.4 0.3 - 1.0 mg/dL   Alkaline Phosphatase (ALP) 73 34 - 104 U/L   Aspartate Aminotransferase (AST) 15 13 - 39 U/L   Alanine Aminotransferase (ALT) 17 7 - 52 U/L   Calcium 9.6 8.6 - 10.3 mg/dL   BUN/Creatinine Ratio 13.6 10.0 - 20.0  CBC with Differential  Result Value Ref Range   WBC 13.75 (H) 4.40 - 11.00 10*3/uL   RBC 5.05 4.10 - 5.10 10*6/uL   Hemoglobin  15.3 12.3 - 15.3 g/dL   Hematocrit 55.3 64.0 - 44.6 %   Mean Corpuscular Volume (MCV) 88.3 80.0 - 96.0 fL   Mean Corpuscular Hemoglobin (MCH) 30.2 27.5 - 33.2 pg   Mean Corpuscular Hemoglobin Conc (MCHC) 34.2 33.0 - 37.0 g/dL   Red Cell Distribution Width (RDW) 13.6 12.3 - 17.0 %   Platelet Count (PLT) 359 150 - 450 10*3/uL   Mean Platelet Volume (  MPV) 7.1 6.8 - 10.2 fL   Neutrophils % 46 %   Lymphocytes % 43 %   Monocytes % 6 %   Eosinophils % 4 %   Basophils % 1 %   Neutrophils Absolute 6.30 1.80 - 7.80 10*3/uL   Lymphocytes # 5.90 (H) 1.00 - 4.80 10*3/uL   Monocytes # 0.80 0.00 - 0.80 10*3/uL   Eosinophils # 0.60 (H) 0.00 - 0.50 10*3/uL   Basophils # 0.20 0.00 - 0.20 10*3/uL  Hemoglobin A1C With Estimated Average Glucose  Result Value Ref Range   Hemoglobin A1c 8.8 (H) <5.7 %   Estimated Average Glucose 206 (H) 70 - 154 mg/dL  Basic Metabolic Panel  Result Value Ref Range   Sodium 137 136 - 145 mmol/L   Potassium 3.5 3.4 - 4.5 mmol/L   Chloride 102 98 - 107 mmol/L   CO2 25 21 - 31 mmol/L   Anion Gap 10 6 - 14 mmol/L   Glucose, Random 189 (H) 70 - 99 mg/dL   Blood Urea Nitrogen (BUN) 8 7 - 25 mg/dL   Creatinine 9.42 (L) 9.39 - 1.20 mg/dL   eGFR >09 >40 fO/fpw/8.26f7   Calcium 8.8 8.6 - 10.3 mg/dL   BUN/Creatinine Ratio 14.0 10.0 - 20.0  Lipid Panel  Result Value Ref Range   Cholesterol, Total, Lipid Panel 175 <200 mg/dL   Triglycerides, Lipid Panel 137 <150 mg/dL   HDL Cholesterol - Lipid Panel 46 (L) >50 mg/dL   LDL Cholesterol, Calculated 105 (H) <100 mg/dL   Non-HDL Cholesterol 870 <130 mg/dL  TSH With Reflex To Free T4  Result Value Ref Range   TSH 2.531 0.450 - 5.330 uIU/mL  POC HCG Qualitative, Urine  Result Value Ref Range   HCG, Urine, POC Negative Negative   Internal Control Acceptable    Kit/Device Lot # [id number]    Kit/Device Expiration Date 03/10/2026   POCT URINE DRUG SCREEN  Result Value Ref Range   Amphetamine      Barbituates     Buprenorphine      Benzodiazapine     Cocaine     Methylenedioxymeth-amphetamine      d-Methamphetamine     Morphine      Methadone     Oxycodone      Phencyclidine     Marijuana     Internal Control     Kit/Device Lot #     Kit/Device Expiration Date     Fentanyl      Internal Control     Kit/Device Lot #     Kit/Device Expiration Date    POC Glucose  Result Value Ref Range   Glucose, POC 230 (H) 70 - 99 mg/dL  POC Glucose  Result Value Ref Range   Glucose, POC 196 (H) 70 - 99 mg/dL  POC Glucose  Result Value Ref Range   Glucose, POC 201 (H) 70 - 99 mg/dL  POC Glucose  Result Value Ref Range   Glucose, POC 218 (H) 70 - 99 mg/dL  POC Glucose  Result Value Ref Range   Glucose, POC 233 (H) 70 - 99 mg/dL  POC Glucose  Result Value Ref Range   Glucose, POC 163 (H) 70 - 99 mg/dL  POC Glucose  Result Value Ref Range   Glucose, POC 186 (H) 70 - 99 mg/dL  POC Glucose  Result Value Ref Range   Glucose, POC 257 (H) 70 - 99 mg/dL  POC Glucose  Result Value Ref Range   Glucose, POC 159 (H) 70 -  99 mg/dL  POC Glucose  Result Value Ref Range   Glucose, POC 182 (H) 70 - 99 mg/dL  POC Glucose  Result Value Ref Range   Glucose, POC 179 (H) 70 - 99 mg/dL  POC Glucose  Result Value Ref Range   Glucose, POC 196 (H) 70 - 99 mg/dL  POC Glucose  Result Value Ref Range   Glucose, POC 178 (H) 70 - 99 mg/dL  POC Glucose  Result Value Ref Range   Glucose, POC 178 (H) 70 - 99 mg/dL  POC Glucose  Result Value Ref Range   Glucose, POC 143 (H) 70 - 99 mg/dL  POC Glucose  Result Value Ref Range   Glucose, POC 166 (H) 70 - 99 mg/dL     Discharge Mental Status Examination   Mental Status Examination: General Appearance obese, casually dressed, and hygiene appropriate  General Behavior cooperative, pleasant, and appropriate eye contact  Psychomotor Activity normoactive  Gait and Station not assessed  Speech   fluent, normal volume, normal tone, normal prosody, and normal amount  Mood   fine   Affect    euthymic  Thought Process linear/organized and goal directed  Associations intact  Thought Content/Perceptual Disturbances Denies suicidal/homicidal ideation and auditory/visual hallucinations.  Cognition/Sensorium  orientation grossly intact, memory grossly intact, attention grossly intact, language normal, and fund of knowledge grossly intact  Insight  fair  Judgment fair   Discharge Diagnoses   Psychiatric Diagnoses: Opioid Use Disorder, Severe (F11.20) (Primary Diagnosis) Bipolar I Disorder, Current or most recent episode depressed, Moderate (F31.32)   Medical Diagnoses: Medical History[1]  Psychosocial and contextual factors: Risk Factors: history of prior suicide attempts, economic stressors, and problems with primary support group Protective Factors: successful past responses to stress and capacity for reality testing    C-SSRS ([place] Suicide Severity Risk Scale and Protective Factors)   Since Last Asked Suicide Risk (Since Last Asked) Assessment - ongoing 1. Have you wished you were dead or wished you could go to sleep and not wake up? (Since Last Asked): No 2. Have you actually had any thoughts of killing yourself? (Since Last Asked): No 6. Have you done anything, started to do anything, or prepared to do anything to end your life? (Since Last Asked): No Calculated C-SSRS Risk Score (Since Last Asked): No Risk Indicated  Patient Instructions     Discharge Medications     New Medications      Sig Disp Refill Start End  naloxone 4 mg/actuation Spry nasal spray Commonly known as: Narcan  Administer 1 spray into affected nostril(s) as needed for opioid reversal.  1 each  0         Modified Medications      Sig Disp Refill Start End  gabapentin  100 mg capsule Commonly known as: NEURONTIN  What changed: Another medication with the same name was removed. Continue taking this medication, and follow the directions you see here.  Take 1 capsule by  mouth Once Daily.   0         Medications To Continue      Sig Disp Refill Start End  ARIPiprazole  10 mg tablet Commonly known as: ABILIFY   Take 1 tablet by mouth Once Daily.   0     aspirin  81 mg EC tablet  Take 81 mg by mouth Once Daily.   0     CENTRUM WOMEN ORAL  Take 1 tablet by mouth daily.   0     cholecalciferol 2,000 unit tablet Commonly  known as: VITAMIN D3  Take 5,000 Units by mouth daily.   0     ezetimibe  10 mg tablet Commonly known as: ZETIA   Take one tab by mouth daily   0     hydrOXYzine  50 mg capsule Commonly known as: VISTARIL   Take 50 mg by mouth at bedtime.   0     insulin  aspart U-100 100 unit/mL injection Commonly known as: NovoLOG   Inject under the skin daily. Us  with pump 120 units daily   0     lisinopriL  2.5 mg tablet Commonly known as: PRINIVIL   Take 2.5 mg by mouth Once Daily.   0     nicotine  21 mg/24 hr patch Commonly known as: NICODERM CQ   Place 1 patch on the skin daily.  30 patch  0     omega 3-dha-epa-fish oil 1,000 mg DR capsule Commonly known as: OMEGA 3  Take 1 capsule by mouth 2 (two) times a day.   0     OMNIPOD 5 G6 PODS (GEN 5) SUBQ  Inject under the skin.   0     pravastatin  80 mg tablet Commonly known as: PRAVACHOL   Take 80 mg by mouth.   0     sertraline  100 mg tablet Commonly known as: ZOLOFT   Take 200 mg by mouth daily.   0     Synjardy 12.5-1,000 mg Tab Generic drug: empagliflozin-metFORMIN   Take 1 tablet by mouth 2 (two) times a day.   0     traZODone  100 mg tablet Commonly known as: DESYREL   Take 2 tablets by mouth nightly.   0          Powell Berg, MD 09/18/2024 11:37 AM [facility] Department of Psychiatry & Behavioral Medicine           [1] Past Medical History: Diagnosis Date   Back pain    Depression    Diabetes mellitus (CMD)   "

## 2024-10-02 ENCOUNTER — Emergency Department (HOSPITAL_BASED_OUTPATIENT_CLINIC_OR_DEPARTMENT_OTHER)
Admission: EM | Admit: 2024-10-02 | Discharge: 2024-10-03 | Disposition: A | Discharge status: Home / Self Care | Attending: Emergency Medicine | Admitting: Emergency Medicine

## 2024-10-02 ENCOUNTER — Other Ambulatory Visit (HOSPITAL_BASED_OUTPATIENT_CLINIC_OR_DEPARTMENT_OTHER): Payer: Self-pay

## 2024-10-02 ENCOUNTER — Other Ambulatory Visit: Payer: Self-pay

## 2024-10-02 ENCOUNTER — Encounter (HOSPITAL_BASED_OUTPATIENT_CLINIC_OR_DEPARTMENT_OTHER): Payer: Self-pay

## 2024-10-02 ENCOUNTER — Emergency Department (HOSPITAL_BASED_OUTPATIENT_CLINIC_OR_DEPARTMENT_OTHER)
Admission: EM | Admit: 2024-10-02 | Discharge: 2024-10-02 | Disposition: A | Discharge status: Home / Self Care | Attending: Emergency Medicine | Admitting: Emergency Medicine

## 2024-10-02 DIAGNOSIS — Z7982 Long term (current) use of aspirin: Secondary | ICD-10-CM | POA: Insufficient documentation

## 2024-10-02 DIAGNOSIS — F17200 Nicotine dependence, unspecified, uncomplicated: Secondary | ICD-10-CM | POA: Diagnosis not present

## 2024-10-02 DIAGNOSIS — G8929 Other chronic pain: Secondary | ICD-10-CM | POA: Insufficient documentation

## 2024-10-02 DIAGNOSIS — Z794 Long term (current) use of insulin: Secondary | ICD-10-CM | POA: Insufficient documentation

## 2024-10-02 DIAGNOSIS — Z7984 Long term (current) use of oral hypoglycemic drugs: Secondary | ICD-10-CM | POA: Insufficient documentation

## 2024-10-02 DIAGNOSIS — M545 Low back pain, unspecified: Secondary | ICD-10-CM | POA: Insufficient documentation

## 2024-10-02 DIAGNOSIS — E1140 Type 2 diabetes mellitus with diabetic neuropathy, unspecified: Secondary | ICD-10-CM | POA: Diagnosis not present

## 2024-10-02 MED ORDER — ONDANSETRON 4 MG PO TBDP
4.0000 mg | ORAL_TABLET | Freq: Once | ORAL | Status: AC
  Administered 2024-10-02: 4 mg via ORAL
  Filled 2024-10-02: qty 1

## 2024-10-02 MED ORDER — METHOCARBAMOL 500 MG PO TABS
500.0000 mg | ORAL_TABLET | Freq: Three times a day (TID) | ORAL | 0 refills | Status: DC | PRN
  Filled 2024-10-02: qty 20, 7d supply, fill #0

## 2024-10-02 MED ORDER — LIDOCAINE 5 % EX PTCH
1.0000 | MEDICATED_PATCH | CUTANEOUS | Status: DC
  Filled 2024-10-02: qty 1

## 2024-10-02 MED ORDER — MORPHINE SULFATE (PF) 4 MG/ML IV SOLN
6.0000 mg | Freq: Once | INTRAVENOUS | Status: AC
  Administered 2024-10-02: 6 mg via INTRAMUSCULAR
  Filled 2024-10-02: qty 2

## 2024-10-02 NOTE — ED Triage Notes (Signed)
 Pt presents via POV c/o lower back pain. Reports seen this am for same. Reports meds prescribed from earlier visit in ineffective. Reports high tolerance to medication. Reports hx of chronic back pain. Ambulatory to triage. A&O x4.

## 2024-10-02 NOTE — ED Provider Notes (Signed)
 "  [name]  Provider Note  CSN: [id number] Arrival date & time: 10/02/24 2133  History Chief Complaint  Patient presents with   Back Pain    [name] is a 43 y.o. female with history of chronic back pain was previously on Oxycodone  20mg  managed by [facility] but has not been to see them in over a year. Awaiting referral from PCP for a new pain management doctor. Also recently went through inpatient detox at [facility] for fentanyl /OUD. She reports exacerbation of her chronic pain. Was seen in the ED earlier today and given IM morphine  and Rx for Robaxin  which has not helped. She reported APAP ineffective and does not tolerate NSAIDs. No new or concerning symptoms.    Home Medications Prior to Admission medications  Medication Sig Start Date End Date Taking? Authorizing Provider  tiZANidine  (ZANAFLEX ) 4 MG tablet Take 1 tablet (4 mg total) by mouth every 6 (six) hours as needed for muscle spasms. 10/03/24  Yes Roselyn Carlin NOVAK, MD  acetaminophen  (TYLENOL ) 500 MG tablet Take 1 tablet (500 mg total) by mouth every 6 (six) hours as needed. Patient taking differently: Take 500 mg by mouth every 6 (six) hours as needed for moderate pain. 04/26/22   Redwine, Madison A, PA-C  albuterol  (PROVENTIL ) (2.5 MG/3ML) 0.083% nebulizer solution Take 2.5 mg by nebulization every 6 (six) hours as needed for wheezing or shortness of breath. 11/29/22 11/29/23  [provider]  albuterol  (VENTOLIN  HFA) 108 (90 Base) MCG/ACT inhaler Inhale 1-2 puffs into the lungs every 6 (six) hours as needed for wheezing or shortness of breath. 12/02/22   Jillian Buttery, MD  amphetamine -dextroamphetamine  (ADDERALL) 30 MG tablet Take 30 mg by mouth daily. 09/28/20   [provider]  ARIPiprazole  (ABILIFY ) 10 MG tablet Take 10 mg by mouth every morning. 01/21/21   [provider]  aspirin  EC 81 MG tablet Take 81 mg by mouth daily. Swallow whole.     [provider]  BD PEN NEEDLE NANO 2ND GEN 32G X 4 MM MISC daily. 09/23/20   [provider]  busPIRone  (BUSPAR ) 15 MG tablet Take 15 mg by mouth 3 (three) times daily. 10/14/22   [provider]  Continuous Blood Gluc Sensor (FREESTYLE LIBRE 3 SENSOR) MISC 1 each every 14 (fourteen) days. 11/21/22   [provider]  Empagliflozin-metFORMIN  HCl (SYNJARDY) 12.02-999 MG TABS Take 1 tablet by mouth 2 (two) times daily. 02/12/21   [provider]  ezetimibe  (ZETIA ) 10 MG tablet Take 1 tablet (10 mg total) by mouth daily. 09/26/17   Arloa Suzen RAMAN, NP  Fluticasone  Furoate-Vilanterol (BREO ELLIPTA ) 50-25 MCG/ACT AEPB Inhale 1 puff into the lungs daily in the afternoon. Patient not taking: Reported on 12/01/2022 08/13/22   Rojelio Nest, DO  furosemide  (LASIX ) 20 MG tablet Take 1 tablet (20 mg total) by mouth daily for 7 days. 11/15/23 11/22/23  Pamella Ozell LABOR, DO  gabapentin  (NEURONTIN ) 100 MG capsule Take 1 capsule (100 mg total) by mouth daily after breakfast. Patient taking differently: Take 100 mg by mouth every morning. 02/14/20   Lucilla Lynwood BRAVO, MD  gabapentin  (NEURONTIN ) 300 MG capsule Take 1-2 capsules (300-600 mg total) by mouth at bedtime. Only take as prescribed.  Husband is to administer medication Patient taking differently: Take 600 mg by mouth at bedtime. 02/14/20   Nitka, James E, MD  hydrOXYzine  (VISTARIL ) 100 MG capsule Take 1 capsule (100 mg total) by mouth 3 (three)  times daily as needed for itching. 11/19/23   Bero, Michael M, MD  insulin  aspart (NOVOLOG ) 100 UNIT/ML injection See admin instructions. USE WITH OMNIPOD 5 PUMP. TOTAL 120 UNITS/DAY 04/24/24   [provider]  Insulin  Disposable Pump (OMNIPOD 5 LIBRE2 PLUS G6 PODS) MISC Inject into the skin.    [provider]  lisinopril  (PRINIVIL ,ZESTRIL ) 2.5 MG tablet Take 1 tablet (2.5 mg total) by mouth daily. 05/10/17   Arloa Suzen RAMAN, NP  Multiple Vitamin (MULTIVITAMIN WITH  MINERALS) TABS tablet Take 1 tablet by mouth every morning.    [provider]  NOVOLOG  MIX 70/30 FLEXPEN (70-30) 100 UNIT/ML FlexPen Inject 44 Units into the skin 2 (two) times daily. 11/25/22   [provider]  Omega-3 Fatty Acids (FISH OIL PO) Take 1 capsule by mouth every morning.    [provider]  pravastatin  (PRAVACHOL ) 80 MG tablet Take 1 tablet (80 mg total) by mouth daily. Patient taking differently: Take 80 mg by mouth every morning. 05/11/17   Arloa Suzen RAMAN, NP  Semaglutide , 1 MG/DOSE, (OZEMPIC , 1 MG/DOSE,) 4 MG/3ML SOPN Inject 2 mg into the skin once a week. Friday 02/12/21   [provider]  sertraline  (ZOLOFT ) 100 MG tablet Take 2 tablets (200 mg total) by mouth daily. For depression and anxiety. 06/13/13   Rochel Aureliano DASEN, PA-C  traZODone  (DESYREL ) 100 MG tablet Take 200 mg by mouth at bedtime. 01/21/21   [provider]     Allergies    Orange oil, Penicillins, Carisoprodol, and Piroxicam   Review of Systems   Review of Systems Please see HPI for pertinent positives and negatives  Physical Exam BP (!) 133/92   Pulse (!) 103   Temp 98 F (36.7 C)   Resp 18   LMP 09/18/2024 (Approximate)   SpO2 96%   Physical Exam Vitals and nursing note reviewed.  HENT:     Head: Normocephalic.     Nose: Nose normal.  Eyes:     Extraocular Movements: Extraocular movements intact.  Pulmonary:     Effort: Pulmonary effort is normal.  Musculoskeletal:        General: Tenderness (diffuse lower back) present. Normal range of motion.     Cervical back: Neck supple.  Skin:    Findings: No rash (on exposed skin).  Neurological:     Mental Status: She is alert and oriented to person, place, and time.  Psychiatric:        Mood and Affect: Mood normal.     ED Results / Procedures / Treatments   EKG None  Procedures Procedures  Medications Ordered in the ED Medications - No data to display  Initial Impression and Plan  Patient  here with chronic back pain, no red flags. Seen in ED for same earlier today. Has history of opioid use disorder and recent detox. Advised narcotics not appropriate in ED setting. Offered a change to different muscle relaxer. Encouraged to continue APAP. Outpatient Pain Management.   ED Course       MDM Rules/Calculators/A&P Medical Decision Making Problems Addressed: Chronic back pain, unspecified back location, unspecified back pain laterality: chronic illness or injury with exacerbation, progression, or side effects of treatment  Risk Prescription drug management.     Final Clinical Impression(s) / ED Diagnoses Final diagnoses:  Chronic back pain, unspecified back location, unspecified back pain laterality    Rx / DC Orders ED Discharge Orders          Ordered  tiZANidine  (ZANAFLEX ) 4 MG tablet  Every 6 hours PRN        10/03/24 0000             Roselyn Carlin NOVAK, MD 10/03/24 0001  "

## 2024-10-02 NOTE — ED Triage Notes (Signed)
 Pt caox4 ambulatory c/o mid-lower back pain since waking up this morning, states she feels like she slept wrong but reports PMH degenerative disc disease and arthritis in back. Took tylenol  at 0845 with no relief.

## 2024-10-02 NOTE — Discharge Instructions (Addendum)
 It was a pleasure taking care of you today.  Based on your history and physical exam I feel you are safe for discharge.  Today your pain was controlled while in the emergency department.  Please follow-up with your spine specialist as well as pain management and your primary care provider, please make them aware of your visit today and all findings.  I have sent in a prescription for a muscle relaxant to the pharmacy, you may pick them up and take as prescribed.  I recommend that you continue taking over-the-counter Tylenol  for help with pain as well.  You may also consider over-the-counter lidocaine  patches.  If you experience any of the following symptoms including but not limited to fever, chills, lower extremity weakness, issues with bowel/bladder incontinence or retention, severe pain, or other concerning symptom please return to the emergency department or seek further medical care.  Recommend follow-up with your primary care provider within the next 7 to 10 days for recheck. Please do not take your prescribed robaxin  with other sedating medication like narcotics or trazodone . Please do not drive or operate heavy machinery after taking it as it may make you drowsy.

## 2024-10-02 NOTE — ED Provider Notes (Addendum)
 " [place] EMERGENCY DEPARTMENT AT [facility] Provider Note   CSN: [id number] Arrival date & time: 10/02/24  9044     Patient presents with: No chief complaint on file.   [name] is a 43 y.o. female who presents to the emergency department with a chief complaint of low back pain.  Patient has a history of chronic back pain with degenerative disc disease in her lumbar spine.  Patient has previously seen a spine surgeon who recommended a spinal fusion, however patient states that her insurance has not approved this thus far.  Patient was previously following with pain management however is currently searching for a new pain management provider and is off of all narcotic pain medications.  Patient denies acute trauma/injury, stating that she thinks she slept wrong.  Denies groin numbness/tingling, bowel/bladder incontinence, lower extremity weakness, fever, chills.  Patient denies history of IV drug use.  Past medical history significant for depression with suicidal ideation, tobacco abuse, back pain, alcohol abuse, major depressive disorder, bipolar 1 disorder, overdose of antipsychotic medication, diabetes, ADD, diabetic neuropathy, polysubstance abuse, etc. Patient is ambulatory, denies weakness, states she is only experiencing pain.    HPI     Prior to Admission medications  Medication Sig Start Date End Date Taking? Authorizing Provider  methocarbamol  (ROBAXIN ) 500 MG tablet Take 1 tablet (500 mg total) by mouth every 8 (eight) hours as needed for muscle spasms. 10/02/24  Yes Amir Fick F, PA-C  acetaminophen  (TYLENOL ) 500 MG tablet Take 1 tablet (500 mg total) by mouth every 6 (six) hours as needed. Patient taking differently: Take 500 mg by mouth every 6 (six) hours as needed for moderate pain. 04/26/22   Redwine, Madison A, PA-C  albuterol  (PROVENTIL ) (2.5 MG/3ML) 0.083% nebulizer solution Take 2.5 mg by nebulization every 6 (six) hours as needed for  wheezing or shortness of breath. 11/29/22 11/29/23  [provider]  albuterol  (VENTOLIN  HFA) 108 (90 Base) MCG/ACT inhaler Inhale 1-2 puffs into the lungs every 6 (six) hours as needed for wheezing or shortness of breath. 12/02/22   Jillian Buttery, MD  amphetamine -dextroamphetamine  (ADDERALL) 30 MG tablet Take 30 mg by mouth daily. 09/28/20   [provider]  ARIPiprazole  (ABILIFY ) 10 MG tablet Take 10 mg by mouth every morning. 01/21/21   [provider]  aspirin  EC 81 MG tablet Take 81 mg by mouth daily. Swallow whole.    [provider]  BD PEN NEEDLE NANO 2ND GEN 32G X 4 MM MISC daily. 09/23/20   [provider]  busPIRone  (BUSPAR ) 15 MG tablet Take 15 mg by mouth 3 (three) times daily. 10/14/22   [provider]  Continuous Blood Gluc Sensor (FREESTYLE LIBRE 3 SENSOR) MISC 1 each every 14 (fourteen) days. 11/21/22   [provider]  Empagliflozin-metFORMIN  HCl (SYNJARDY) 12.02-999 MG TABS Take 1 tablet by mouth 2 (two) times daily. 02/12/21   [provider]  ezetimibe  (ZETIA ) 10 MG tablet Take 1 tablet (10 mg total) by mouth daily. 09/26/17   Arloa Suzen RAMAN, NP  Fluticasone  Furoate-Vilanterol (BREO ELLIPTA ) 50-25 MCG/ACT AEPB Inhale 1 puff into the lungs daily in the afternoon. Patient not taking: Reported on 12/01/2022 08/13/22   Rojelio Nest, DO  furosemide  (LASIX ) 20 MG tablet Take 1 tablet (20 mg total) by mouth daily for 7 days. 11/15/23 11/22/23  Pamella Ozell LABOR, DO  gabapentin  (NEURONTIN ) 100 MG capsule Take 1 capsule (100 mg total) by mouth daily after breakfast. Patient taking differently:  Take 100 mg by mouth every morning. 02/14/20   Lucilla Lynwood BRAVO, MD  gabapentin  (NEURONTIN ) 300 MG capsule Take 1-2 capsules (300-600 mg total) by mouth at bedtime. Only take as prescribed.  Husband is to administer medication Patient taking differently: Take 600 mg by mouth at bedtime. 02/14/20   Nitka, James E, MD   HYDROcodone -acetaminophen  (NORCO/VICODIN) 5-325 MG tablet Take 1 tablet by mouth every 6 (six) hours as needed for moderate pain (pain score 4-6). 04/19/24   Zackowski, Scott, MD  hydrOXYzine  (VISTARIL ) 100 MG capsule Take 1 capsule (100 mg total) by mouth 3 (three) times daily as needed for itching. 11/19/23   Bero, Michael M, MD  insulin  aspart (NOVOLOG ) 100 UNIT/ML injection See admin instructions. USE WITH OMNIPOD 5 PUMP. TOTAL 120 UNITS/DAY 04/24/24   [provider]  Insulin  Disposable Pump (OMNIPOD 5 LIBRE2 PLUS G6 PODS) MISC Inject into the skin.    [provider]  lisinopril  (PRINIVIL ,ZESTRIL ) 2.5 MG tablet Take 1 tablet (2.5 mg total) by mouth daily. 05/10/17   Arloa Suzen RAMAN, NP  Multiple Vitamin (MULTIVITAMIN WITH MINERALS) TABS tablet Take 1 tablet by mouth every morning.    [provider]  NOVOLOG  MIX 70/30 FLEXPEN (70-30) 100 UNIT/ML FlexPen Inject 44 Units into the skin 2 (two) times daily. 11/25/22   [provider]  Omega-3 Fatty Acids (FISH OIL PO) Take 1 capsule by mouth every morning.    [provider]  oxyCODONE  (ROXICODONE ) 5 MG immediate release tablet Take 1 tablet (5 mg total) by mouth every 6 (six) hours as needed for up to 10 doses. 05/10/24   Curatolo, Adam, DO  pravastatin  (PRAVACHOL ) 80 MG tablet Take 1 tablet (80 mg total) by mouth daily. Patient taking differently: Take 80 mg by mouth every morning. 05/11/17   Arloa Suzen RAMAN, NP  Semaglutide , 1 MG/DOSE, (OZEMPIC , 1 MG/DOSE,) 4 MG/3ML SOPN Inject 2 mg into the skin once a week. Friday 02/12/21   [provider]  sertraline  (ZOLOFT ) 100 MG tablet Take 2 tablets (200 mg total) by mouth daily. For depression and anxiety. 06/13/13   Rochel Aureliano DASEN, PA-C  traZODone  (DESYREL ) 100 MG tablet Take 200 mg by mouth at bedtime. 01/21/21   [provider]    Allergies: Orange oil, Penicillins, Carisoprodol, and Piroxicam    Review of Systems  Musculoskeletal:   Positive for back pain.    Updated Vital Signs BP (!) 125/93   Pulse 98   Temp 97.8 F (36.6 C) (Oral)   Resp 20   Ht 5' 6 (1.676 m)   Wt 96.2 kg   LMP 09/18/2024 (Approximate)   SpO2 100%   BMI 34.22 kg/m   Physical Exam Vitals and nursing note reviewed.  Constitutional:      General: She is awake. She is not in acute distress.    Appearance: Normal appearance. She is not ill-appearing, toxic-appearing or diaphoretic.  HENT:     Head: Normocephalic and atraumatic.  Eyes:     General: No scleral icterus. Pulmonary:     Effort: Pulmonary effort is normal. No respiratory distress.  Musculoskeletal:        General: Normal range of motion.     Cervical back: Normal range of motion. No tenderness (No tenderness with palpation of cervical spine).     Right lower leg: No edema.     Left lower leg: No edema.     Comments: Grossly normal ROM of all 4 extremities, patient ambulatory without assistance, generalized  tenderness of lumbar spine and also involving paraspinal muscle areas, no thoracic spine tenderness  Equal grip strength bilaterally, no strength deficit of upper or lower extremities against resistance as well as gravity, equal plantar and dorsiflexion of bilateral lower extremities  Bilateral lower extremities neurovascularly intact  Skin:    General: Skin is warm.     Capillary Refill: Capillary refill takes less than 2 seconds.  Neurological:     General: No focal deficit present.     Mental Status: She is alert and oriented to person, place, and time.     GCS: GCS eye subscore is 4. GCS verbal subscore is 5. GCS motor subscore is 6.     Motor: Motor function is intact. No weakness.     Gait: Gait is intact.  Psychiatric:        Mood and Affect: Mood normal.        Behavior: Behavior normal. Behavior is cooperative.     (all labs ordered are listed, but only abnormal results are displayed) Labs Reviewed - No data to display  EKG: None  Radiology: No  results found.   Procedures   Medications Ordered in the ED  morphine  (PF) 4 MG/ML injection 6 mg (6 mg Intramuscular Given 10/02/24 1056)  ondansetron  (ZOFRAN -ODT) disintegrating tablet 4 mg (4 mg Oral Given 10/02/24 1056)                                    Medical Decision Making Risk Prescription drug management.   Patient presents to the ED for concern of back pain, this involves an extensive number of treatment options, and is a complaint that carries with it a high risk of complications and morbidity.  The differential diagnosis includes degenerative disc disease, spinal stenosis, spinous abscess, cauda equina syndrome, muscle strain/sprain, acute flare of chronic back pain, etc.   Co morbidities that complicate the patient evaluation  depression with suicidal ideation, tobacco abuse, back pain, alcohol abuse, major depressive disorder, bipolar 1 disorder, overdose of antipsychotic medication, diabetes, ADD, diabetic neuropathy, polysubstance abuse   Additional history obtained:  Previous imaging reviewed including CT scans of head, cervical spine, as well as lumbar spine 5 months ago which showed chronic changes   Medicines ordered and prescription drug management:  I ordered medication including morphine  for pain, Zofran  for nausea Reevaluation of the patient after these medicines showed that the patient improved I have reviewed the patients home medicines and have made adjustments as needed   Test Considered:  Imaging: Deferred at this time as patient has had recent imaging, back pain today is atraumatic in nature, doubt that it would add anything to today's encounter, patient states that symptoms are consistent with previous back pain flares   Critical Interventions:  None   Problem List / ED Course:  43 year old female, presents to the  emergency department with a chief complaint of acute on chronic back pain Physical exam reassuring, no strength deficit  or sensation deficit, patient ambulatory without assistance, no red flag back pain symptoms, no fever, groin numbness/tingling, weakness, denies history of IV drug use Patient has had extensive imaging completed of her lumbar spine most recently with CT scans completed approximately 5 months ago, I do not think imaging would add anything to this visit as there was no acute trauma per patient, patient has also had MRI imaging completed previously  Will provide pain relief while in the  emergency department and plan for discharge with muscle relaxant medications as well as outpatient supportive care Patient instructed to follow-up with primary care provider, spine specialist, as well as pain management Patient understanding of this Informed by nursing staff that patient needed to leave because her dog had a grooming appointment Return precautions given Patient discharged Most likely diagnosis at this time is acute flare of chronic back pain, atraumatic in nature   Reevaluation:  After the interventions noted above, I reevaluated the patient and found that they have :improved   Social Determinants of Health:  History of substance abuse   Dispostion:  After consideration of the diagnostic results and the patients response to treatment, I feel that the patient would benefit from discharge and outpatient therapy as described.       Final diagnoses:  Chronic midline low back pain without sciatica    ED Discharge Orders          Ordered    methocarbamol  (ROBAXIN ) 500 MG tablet  Every 8 hours PRN        10/02/24 1120               Merial Moritz F, PA-C 10/02/24 2150    Shifa Brisbon F, PA-C 10/02/24 2315    Dasie Faden, MD 10/05/24 1214  "

## 2024-10-03 MED ORDER — TIZANIDINE HCL 4 MG PO TABS: 4.0000 mg | ORAL_TABLET | Freq: Four times a day (QID) | ORAL | 0 refills | Status: AC | PRN

## 2024-10-05 ENCOUNTER — Emergency Department (HOSPITAL_BASED_OUTPATIENT_CLINIC_OR_DEPARTMENT_OTHER)
Admission: EM | Admit: 2024-10-05 | Discharge: 2024-10-05 | Disposition: A | Discharge status: Home / Self Care | Attending: Emergency Medicine | Admitting: Emergency Medicine

## 2024-10-05 ENCOUNTER — Other Ambulatory Visit: Payer: Self-pay

## 2024-10-05 ENCOUNTER — Encounter (HOSPITAL_BASED_OUTPATIENT_CLINIC_OR_DEPARTMENT_OTHER): Payer: Self-pay | Admitting: Emergency Medicine

## 2024-10-05 DIAGNOSIS — Z7982 Long term (current) use of aspirin: Secondary | ICD-10-CM | POA: Insufficient documentation

## 2024-10-05 DIAGNOSIS — R7309 Other abnormal glucose: Secondary | ICD-10-CM | POA: Insufficient documentation

## 2024-10-05 DIAGNOSIS — M545 Low back pain, unspecified: Secondary | ICD-10-CM | POA: Insufficient documentation

## 2024-10-05 LAB — CBG MONITORING, ED: Glucose-Capillary: 178 mg/dL — ABNORMAL HIGH (ref 70–99)

## 2024-10-05 MED ORDER — METHYLPREDNISOLONE 4 MG PO TBPK: ORAL_TABLET | ORAL | 0 refills | Status: AC

## 2024-10-05 MED ORDER — OXYCODONE-ACETAMINOPHEN 5-325 MG PO TABS
1.0000 | ORAL_TABLET | Freq: Once | ORAL | Status: AC
  Administered 2024-10-05: 1 via ORAL
  Filled 2024-10-05: qty 1

## 2024-10-05 NOTE — ED Provider Notes (Signed)
 " [facility] Provider Note   CSN: [id number] Arrival date & time: 10/05/24  0205     Patient presents with: Back Pain   [name] is a 43 y.o. female.   Presents to the emergency department for evaluation of back pain.  Patient has a history of chronic low back pain and reports that she is having a flare.  Pain is throughout the lumbar region, midline without radiation.  Patient denies new injury.       Prior to Admission medications  Medication Sig Start Date End Date Taking? Authorizing Provider  methylPREDNISolone  (MEDROL  DOSEPAK) 4 MG TBPK tablet As directed 10/05/24  Yes Nyrah Demos, Lonni PARAS, MD  acetaminophen  (TYLENOL ) 500 MG tablet Take 1 tablet (500 mg total) by mouth every 6 (six) hours as needed. Patient taking differently: Take 500 mg by mouth every 6 (six) hours as needed for moderate pain. 04/26/22   Redwine, Madison A, PA-C  albuterol  (PROVENTIL ) (2.5 MG/3ML) 0.083% nebulizer solution Take 2.5 mg by nebulization every 6 (six) hours as needed for wheezing or shortness of breath. 11/29/22 11/29/23  [provider]  albuterol  (VENTOLIN  HFA) 108 (90 Base) MCG/ACT inhaler Inhale 1-2 puffs into the lungs every 6 (six) hours as needed for wheezing or shortness of breath. 12/02/22   Jillian Buttery, MD  amphetamine -dextroamphetamine  (ADDERALL) 30 MG tablet Take 30 mg by mouth daily. 09/28/20   [provider]  ARIPiprazole  (ABILIFY ) 10 MG tablet Take 10 mg by mouth every morning. 01/21/21   [provider]  aspirin  EC 81 MG tablet Take 81 mg by mouth daily. Swallow whole.    [provider]  BD PEN NEEDLE NANO 2ND GEN 32G X 4 MM MISC daily. 09/23/20   [provider]  busPIRone  (BUSPAR ) 15 MG tablet Take 15 mg by mouth 3 (three) times daily. 10/14/22   [provider]  Continuous Blood Gluc Sensor (FREESTYLE LIBRE 3 SENSOR) MISC 1 each every 14 (fourteen) days. 11/21/22    [provider]  Empagliflozin-metFORMIN  HCl (SYNJARDY) 12.02-999 MG TABS Take 1 tablet by mouth 2 (two) times daily. 02/12/21   [provider]  ezetimibe  (ZETIA ) 10 MG tablet Take 1 tablet (10 mg total) by mouth daily. 09/26/17   Arloa Suzen RAMAN, NP  Fluticasone  Furoate-Vilanterol (BREO ELLIPTA ) 50-25 MCG/ACT AEPB Inhale 1 puff into the lungs daily in the afternoon. Patient not taking: Reported on 12/01/2022 08/13/22   Rojelio Nest, DO  furosemide  (LASIX ) 20 MG tablet Take 1 tablet (20 mg total) by mouth daily for 7 days. 11/15/23 11/22/23  Pamella Ozell LABOR, DO  gabapentin  (NEURONTIN ) 100 MG capsule Take 1 capsule (100 mg total) by mouth daily after breakfast. Patient taking differently: Take 100 mg by mouth every morning. 02/14/20   Lucilla Lynwood BRAVO, MD  gabapentin  (NEURONTIN ) 300 MG capsule Take 1-2 capsules (300-600 mg total) by mouth at bedtime. Only take as prescribed.  Husband is to administer medication Patient taking differently: Take 600 mg by mouth at bedtime. 02/14/20   Lucilla Lynwood BRAVO, MD  hydrOXYzine  (VISTARIL ) 100 MG capsule Take 1 capsule (100 mg total) by mouth 3 (three) times daily as needed for itching. 11/19/23   Bero, Michael M, MD  insulin  aspart (NOVOLOG ) 100 UNIT/ML injection See admin instructions. USE WITH OMNIPOD 5 PUMP. TOTAL 120 UNITS/DAY 04/24/24   [provider]  Insulin  Disposable Pump (OMNIPOD 5 LIBRE2 PLUS G6 PODS) MISC Inject into the skin.    [provider]  lisinopril  (PRINIVIL ,ZESTRIL ) 2.5 MG tablet Take 1 tablet (2.5 mg total) by mouth daily. 05/10/17   Arloa Suzen RAMAN, NP  Multiple Vitamin (MULTIVITAMIN WITH MINERALS) TABS tablet Take 1 tablet by mouth every morning.    [provider]  NOVOLOG  MIX 70/30 FLEXPEN (70-30) 100 UNIT/ML FlexPen Inject 44 Units into the skin 2 (two) times daily. 11/25/22   [provider]  Omega-3 Fatty Acids (FISH OIL PO) Take 1 capsule by mouth every morning.    [provider]   pravastatin  (PRAVACHOL ) 80 MG tablet Take 1 tablet (80 mg total) by mouth daily. Patient taking differently: Take 80 mg by mouth every morning. 05/11/17   Arloa Suzen RAMAN, NP  Semaglutide , 1 MG/DOSE, (OZEMPIC , 1 MG/DOSE,) 4 MG/3ML SOPN Inject 2 mg into the skin once a week. Friday 02/12/21   [provider]  sertraline  (ZOLOFT ) 100 MG tablet Take 2 tablets (200 mg total) by mouth daily. For depression and anxiety. 06/13/13   Rochel Aureliano DASEN, PA-C  tiZANidine  (ZANAFLEX ) 4 MG tablet Take 1 tablet (4 mg total) by mouth every 6 (six) hours as needed for muscle spasms. 10/03/24   Roselyn Carlin NOVAK, MD  traZODone  (DESYREL ) 100 MG tablet Take 200 mg by mouth at bedtime. 01/21/21   [provider]    Allergies: Orange oil, Penicillins, Carisoprodol, and Piroxicam    Review of Systems  Updated Vital Signs BP (!) 126/94 (BP Location: Right Arm)   Pulse (!) 118   Temp 98 F (36.7 C) (Oral)   Resp 16   Ht 5' 6 (1.676 m)   Wt 96.1 kg   LMP 09/18/2024 (Approximate)   SpO2 97%   BMI 34.20 kg/m   Physical Exam Vitals and nursing note reviewed.  Constitutional:      General: She is not in acute distress.    Appearance: She is well-developed.  HENT:     Head: Normocephalic and atraumatic.     Mouth/Throat:     Mouth: Mucous membranes are moist.  Eyes:     General: Vision grossly intact. Gaze aligned appropriately.     Extraocular Movements: Extraocular movements intact.     Conjunctiva/sclera: Conjunctivae normal.  Cardiovascular:     Rate and Rhythm: Normal rate and regular rhythm.     Pulses: Normal pulses.     Heart sounds: Normal heart sounds, S1 normal and S2 normal. No murmur heard.    No friction rub. No gallop.  Pulmonary:     Effort: Pulmonary effort is normal. No respiratory distress.     Breath sounds: Normal breath sounds.  Abdominal:     General: Bowel sounds are normal.     Palpations: Abdomen is soft.     Tenderness: There is no abdominal tenderness.  There is no guarding or rebound.     Hernia: No hernia is present.  Musculoskeletal:        General: No swelling.     Cervical back: Full passive range of motion without pain, normal range of motion and neck supple. No spinous process tenderness or muscular tenderness. Normal range of motion.     Lumbar back: Tenderness present. Negative right straight leg raise test and negative left straight leg raise test.     Right lower leg: No edema.     Left lower leg: No edema.  Skin:    General: Skin is warm and dry.     Capillary Refill: Capillary refill takes less than 2 seconds.  Findings: No ecchymosis, erythema, rash or wound.  Neurological:     General: No focal deficit present.     Mental Status: She is alert and oriented to person, place, and time.     GCS: GCS eye subscore is 4. GCS verbal subscore is 5. GCS motor subscore is 6.     Cranial Nerves: Cranial nerves 2-12 are intact.     Sensory: Sensation is intact.     Motor: Motor function is intact.     Coordination: Coordination is intact.  Psychiatric:        Attention and Perception: Attention normal.        Mood and Affect: Mood normal.        Speech: Speech normal.        Behavior: Behavior normal.     (all labs ordered are listed, but only abnormal results are displayed) Labs Reviewed  CBG MONITORING, ED - Abnormal; Notable for the following components:      Result Value   Glucose-Capillary 178 (*)    All other components within normal limits    EKG: None  Radiology: No results found.   Procedures   Medications Ordered in the ED  oxyCODONE -acetaminophen  (PERCOCET/ROXICET) 5-325 MG per tablet 1 tablet (has no administration in time range)                                    Medical Decision Making  Patient presents to the ER with musculoskeletal back pain. Examination reveals back tenderness without any associated neurologic findings. Patient's strength, sensation and reflexes were normal. There is no  evidence of saddle anesthesia. Patient does not have a foot drop. Patient has not experienced any change in bowel or bladder function. As such, patient did not require any imaging or further studies. Patient was treated with analgesia.     Final diagnoses:  Acute midline low back pain without sciatica    ED Discharge Orders          Ordered    methylPREDNISolone  (MEDROL  DOSEPAK) 4 MG TBPK tablet        10/05/24 9366               Haze Lonni PARAS, MD 10/05/24 [phone]  "

## 2024-10-05 NOTE — ED Notes (Signed)
 Pt seen walking out of ED with a steady gait and in NAD.

## 2024-10-05 NOTE — ED Triage Notes (Signed)
 Pt c/o chronic low back pain, states that she is in between pain clinics and does not have pain meds at home.

## 2024-10-08 ENCOUNTER — Emergency Department (HOSPITAL_COMMUNITY)
Admission: EM | Admit: 2024-10-08 | Discharge: 2024-10-08 | Discharge status: Against Medical Advice | Attending: Emergency Medicine | Admitting: Emergency Medicine

## 2024-10-08 ENCOUNTER — Other Ambulatory Visit: Payer: Self-pay

## 2024-10-08 ENCOUNTER — Encounter (HOSPITAL_COMMUNITY): Payer: Self-pay

## 2024-10-08 ENCOUNTER — Emergency Department (HOSPITAL_BASED_OUTPATIENT_CLINIC_OR_DEPARTMENT_OTHER)
Admission: EM | Admit: 2024-10-08 | Discharge: 2024-10-08 | Discharge status: Against Medical Advice | Source: Home / Self Care

## 2024-10-08 DIAGNOSIS — M545 Low back pain, unspecified: Secondary | ICD-10-CM | POA: Insufficient documentation

## 2024-10-08 DIAGNOSIS — Z5321 Procedure and treatment not carried out due to patient leaving prior to being seen by health care provider: Secondary | ICD-10-CM | POA: Insufficient documentation

## 2024-10-08 DIAGNOSIS — G8929 Other chronic pain: Secondary | ICD-10-CM | POA: Insufficient documentation

## 2024-10-08 NOTE — ED Triage Notes (Addendum)
 Arrives [facility] with chronic lower back pain.   Had 200mcg fentanyl   enroute with minimal improvement.   In-between pain management clinics and has been without pain meds. Previously taking Oxycodone  20mg s.

## 2024-10-08 NOTE — ED Notes (Signed)
 Pt requested IV removed because she was leaving. Pt is alert and oriented x 4 and ambulatory without assist

## 2024-10-29 ENCOUNTER — Other Ambulatory Visit: Payer: Self-pay

## 2024-10-29 ENCOUNTER — Emergency Department (HOSPITAL_COMMUNITY)
Admission: EM | Admit: 2024-10-29 | Discharge: 2024-10-29 | Discharge status: Against Medical Advice | Attending: Emergency Medicine | Admitting: Emergency Medicine

## 2024-10-29 DIAGNOSIS — M545 Low back pain, unspecified: Secondary | ICD-10-CM | POA: Diagnosis present

## 2024-10-29 DIAGNOSIS — Z5321 Procedure and treatment not carried out due to patient leaving prior to being seen by health care provider: Secondary | ICD-10-CM | POA: Insufficient documentation

## 2024-10-29 NOTE — ED Triage Notes (Signed)
 PT arrives to triage via EMS. PT has complaints of low back pain. PT reports a hx of bulging discs. Pain worsened 2 days ago after lifting a box days ago.

## 2024-10-29 NOTE — ED Provider Triage Note (Signed)
 Emergency Medicine Provider Triage Evaluation Note  [name] , a 44 y.o. female  was evaluated in triage.  Pt complains of back pain. Acute on chronic . Takes gabapentin  Patient picked up a box off the floor 2 days ago. No red flags. Ambulatory   Review of Systems  Positive: Back pain  Negative: fever  Physical Exam  BP (!) 159/106   Pulse (!) 114   Temp 98.8 F (37.1 C) (Oral)   Resp 19   LMP 09/18/2024 (Approximate)   SpO2 96%  Gen:   Awake, no distress   Resp:  Normal effort  MSK:   Moves extremities without difficulty  Other:    Medical Decision Making  Medically screening exam initiated at 3:32 PM.  Appropriate orders placed.  [name] was informed that the remainder of the evaluation will be completed by another provider, this initial triage assessment does not replace that evaluation, and the importance of remaining in the ED until their evaluation is complete.     Arloa Chroman, PA-C 10/29/24 1534
